# Patient Record
Sex: Female | Born: 1970 | Race: Black or African American | Hispanic: No | Smoking: Never smoker
Health system: Southern US, Community
[De-identification: ages and names within clinical notes are randomized; demographics above are authoritative.]

## PROBLEM LIST (undated history)

## (undated) DIAGNOSIS — R0602 Shortness of breath: Secondary | ICD-10-CM

## (undated) DIAGNOSIS — G4730 Sleep apnea, unspecified: Secondary | ICD-10-CM

## (undated) DIAGNOSIS — I10 Essential (primary) hypertension: Secondary | ICD-10-CM

## (undated) DIAGNOSIS — E785 Hyperlipidemia, unspecified: Secondary | ICD-10-CM

## (undated) DIAGNOSIS — I499 Cardiac arrhythmia, unspecified: Secondary | ICD-10-CM

## (undated) DIAGNOSIS — G43909 Migraine, unspecified, not intractable, without status migrainosus: Secondary | ICD-10-CM

## (undated) DIAGNOSIS — M1990 Unspecified osteoarthritis, unspecified site: Secondary | ICD-10-CM

## (undated) DIAGNOSIS — IMO0001 Reserved for inherently not codable concepts without codable children: Secondary | ICD-10-CM

## (undated) DIAGNOSIS — K219 Gastro-esophageal reflux disease without esophagitis: Secondary | ICD-10-CM

## (undated) DIAGNOSIS — R002 Palpitations: Secondary | ICD-10-CM

## (undated) HISTORY — DX: Hyperlipidemia, unspecified: E78.5

## (undated) HISTORY — DX: Palpitations: R00.2

## (undated) HISTORY — DX: Gastro-esophageal reflux disease without esophagitis: K21.9

## (undated) HISTORY — DX: Essential (primary) hypertension: I10

## (undated) HISTORY — DX: Shortness of breath: R06.02

## (undated) HISTORY — DX: Reserved for inherently not codable concepts without codable children: IMO0001

## (undated) HISTORY — DX: Migraine, unspecified, not intractable, without status migrainosus: G43.909

## (undated) HISTORY — PX: REDUCTION MAMMAPLASTY: SUR839

## (undated) HISTORY — DX: Cardiac arrhythmia, unspecified: I49.9

## (undated) HISTORY — DX: Unspecified osteoarthritis, unspecified site: M19.90

## (undated) HISTORY — DX: Sleep apnea, unspecified: G47.30

---

## 1998-07-27 HISTORY — PX: BREAST SURGERY: SHX581

## 1998-12-05 ENCOUNTER — Inpatient Hospital Stay (HOSPITAL_COMMUNITY): Admission: AD | Admit: 1998-12-05 | Discharge: 1998-12-05 | Payer: Self-pay | Admitting: *Deleted

## 1999-02-24 ENCOUNTER — Encounter: Payer: Self-pay | Admitting: Emergency Medicine

## 1999-02-24 ENCOUNTER — Emergency Department (HOSPITAL_COMMUNITY): Admission: EM | Admit: 1999-02-24 | Discharge: 1999-02-24 | Payer: Self-pay | Admitting: Emergency Medicine

## 1999-09-15 ENCOUNTER — Emergency Department (HOSPITAL_COMMUNITY): Admission: EM | Admit: 1999-09-15 | Discharge: 1999-09-15 | Payer: Self-pay

## 2000-08-31 ENCOUNTER — Emergency Department (HOSPITAL_COMMUNITY): Admission: EM | Admit: 2000-08-31 | Discharge: 2000-09-01 | Payer: Self-pay | Admitting: Emergency Medicine

## 2000-08-31 ENCOUNTER — Encounter: Payer: Self-pay | Admitting: Emergency Medicine

## 2001-04-12 ENCOUNTER — Other Ambulatory Visit: Admission: RE | Admit: 2001-04-12 | Discharge: 2001-04-12 | Payer: Self-pay | Admitting: Obstetrics and Gynecology

## 2002-03-30 ENCOUNTER — Other Ambulatory Visit: Admission: RE | Admit: 2002-03-30 | Discharge: 2002-03-30 | Payer: Self-pay | Admitting: Obstetrics and Gynecology

## 2002-06-30 ENCOUNTER — Other Ambulatory Visit: Admission: RE | Admit: 2002-06-30 | Discharge: 2002-06-30 | Payer: Self-pay | Admitting: Obstetrics and Gynecology

## 2002-07-27 DIAGNOSIS — IMO0001 Reserved for inherently not codable concepts without codable children: Secondary | ICD-10-CM

## 2002-07-27 HISTORY — DX: Reserved for inherently not codable concepts without codable children: IMO0001

## 2002-10-02 ENCOUNTER — Encounter: Payer: Self-pay | Admitting: Obstetrics and Gynecology

## 2002-10-02 ENCOUNTER — Ambulatory Visit (HOSPITAL_COMMUNITY): Admission: RE | Admit: 2002-10-02 | Discharge: 2002-10-02 | Payer: Self-pay | Admitting: Obstetrics and Gynecology

## 2002-10-24 ENCOUNTER — Inpatient Hospital Stay (HOSPITAL_COMMUNITY): Admission: AD | Admit: 2002-10-24 | Discharge: 2002-10-24 | Payer: Self-pay | Admitting: Obstetrics and Gynecology

## 2002-10-31 ENCOUNTER — Inpatient Hospital Stay (HOSPITAL_COMMUNITY): Admission: AD | Admit: 2002-10-31 | Discharge: 2002-10-31 | Payer: Self-pay | Admitting: Obstetrics and Gynecology

## 2003-01-31 ENCOUNTER — Ambulatory Visit (HOSPITAL_COMMUNITY): Admission: RE | Admit: 2003-01-31 | Discharge: 2003-01-31 | Payer: Self-pay | Admitting: Obstetrics and Gynecology

## 2003-01-31 ENCOUNTER — Encounter: Admission: RE | Admit: 2003-01-31 | Discharge: 2003-01-31 | Payer: Self-pay | Admitting: Obstetrics and Gynecology

## 2003-01-31 ENCOUNTER — Encounter: Payer: Self-pay | Admitting: Obstetrics and Gynecology

## 2003-02-19 ENCOUNTER — Ambulatory Visit (HOSPITAL_COMMUNITY): Admission: RE | Admit: 2003-02-19 | Discharge: 2003-02-19 | Payer: Self-pay | Admitting: Obstetrics and Gynecology

## 2003-02-19 ENCOUNTER — Encounter: Payer: Self-pay | Admitting: Obstetrics and Gynecology

## 2003-02-21 ENCOUNTER — Inpatient Hospital Stay (HOSPITAL_COMMUNITY): Admission: AD | Admit: 2003-02-21 | Discharge: 2003-02-21 | Payer: Self-pay | Admitting: Obstetrics and Gynecology

## 2003-02-24 ENCOUNTER — Inpatient Hospital Stay (HOSPITAL_COMMUNITY): Admission: AD | Admit: 2003-02-24 | Discharge: 2003-02-26 | Payer: Self-pay | Admitting: Obstetrics and Gynecology

## 2003-04-04 ENCOUNTER — Other Ambulatory Visit: Admission: RE | Admit: 2003-04-04 | Discharge: 2003-04-04 | Payer: Self-pay | Admitting: Obstetrics and Gynecology

## 2003-06-26 ENCOUNTER — Encounter: Admission: RE | Admit: 2003-06-26 | Discharge: 2003-09-24 | Payer: Self-pay | Admitting: Cardiology

## 2003-06-28 ENCOUNTER — Encounter: Admission: RE | Admit: 2003-06-28 | Discharge: 2003-06-28 | Payer: Self-pay | Admitting: Family Medicine

## 2003-06-28 ENCOUNTER — Ambulatory Visit (HOSPITAL_COMMUNITY): Admission: RE | Admit: 2003-06-28 | Discharge: 2003-06-28 | Payer: Self-pay | Admitting: Family Medicine

## 2003-08-07 ENCOUNTER — Encounter: Admission: RE | Admit: 2003-08-07 | Discharge: 2003-08-07 | Payer: Self-pay | Admitting: Sports Medicine

## 2003-10-03 ENCOUNTER — Encounter: Admission: RE | Admit: 2003-10-03 | Discharge: 2003-10-03 | Payer: Self-pay | Admitting: Family Medicine

## 2003-10-09 ENCOUNTER — Encounter: Admission: RE | Admit: 2003-10-09 | Discharge: 2003-10-09 | Payer: Self-pay | Admitting: Family Medicine

## 2003-10-24 ENCOUNTER — Encounter: Admission: RE | Admit: 2003-10-24 | Discharge: 2003-10-24 | Payer: Self-pay | Admitting: Family Medicine

## 2003-11-22 ENCOUNTER — Encounter: Admission: RE | Admit: 2003-11-22 | Discharge: 2003-11-22 | Payer: Self-pay | Admitting: Sports Medicine

## 2003-12-05 ENCOUNTER — Encounter: Admission: RE | Admit: 2003-12-05 | Discharge: 2003-12-05 | Payer: Self-pay | Admitting: Family Medicine

## 2004-03-11 ENCOUNTER — Encounter: Admission: RE | Admit: 2004-03-11 | Discharge: 2004-03-11 | Payer: Self-pay | Admitting: Obstetrics and Gynecology

## 2004-06-16 ENCOUNTER — Ambulatory Visit: Payer: Self-pay | Admitting: Sports Medicine

## 2004-06-30 ENCOUNTER — Ambulatory Visit: Payer: Self-pay | Admitting: Sports Medicine

## 2004-07-10 ENCOUNTER — Ambulatory Visit: Payer: Self-pay | Admitting: Family Medicine

## 2004-09-19 ENCOUNTER — Ambulatory Visit: Payer: Self-pay | Admitting: Family Medicine

## 2004-10-07 ENCOUNTER — Ambulatory Visit: Payer: Self-pay | Admitting: Family Medicine

## 2004-11-24 ENCOUNTER — Ambulatory Visit: Payer: Self-pay | Admitting: Family Medicine

## 2004-12-01 ENCOUNTER — Ambulatory Visit: Payer: Self-pay | Admitting: Sports Medicine

## 2004-12-31 ENCOUNTER — Ambulatory Visit: Payer: Self-pay | Admitting: Family Medicine

## 2005-01-05 ENCOUNTER — Encounter: Admission: RE | Admit: 2005-01-05 | Discharge: 2005-01-05 | Payer: Self-pay | Admitting: Sports Medicine

## 2005-01-05 IMAGING — US US TRANSVAGINAL NON-OB
1 series · 13 of 25 positions shown · non-contrast
Comparison: None.

CLINICAL DATA: Unable to locate IUD.  
TRANSABDOMINAL AND TRANSVAGINAL PELVIC ULTRASOUND:
TECHNIQUE: Both transabdominal and transvaginal ultrasound examinations of the pelvis were performed including evaluation of the uterus, ovaries, adnexal regions, and pelvic cul-de-sac.

[Series 1: unknown · 0.25mm/px · 13 of 53 slices shown]
[im 1/53]
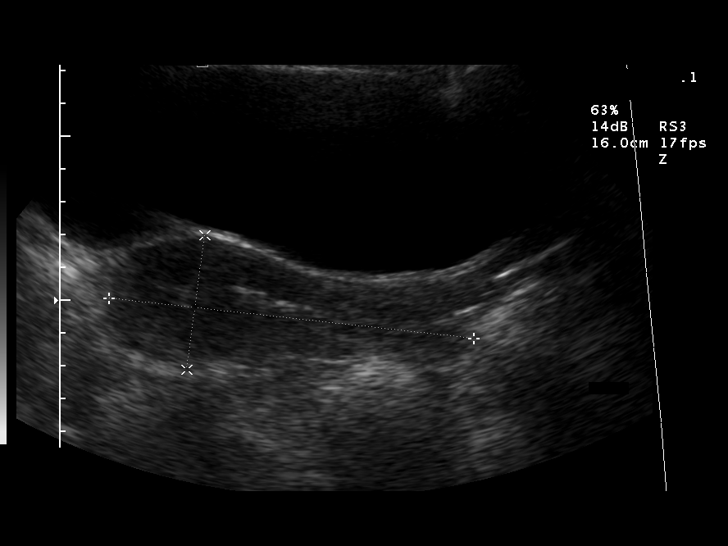
[im 5/53]
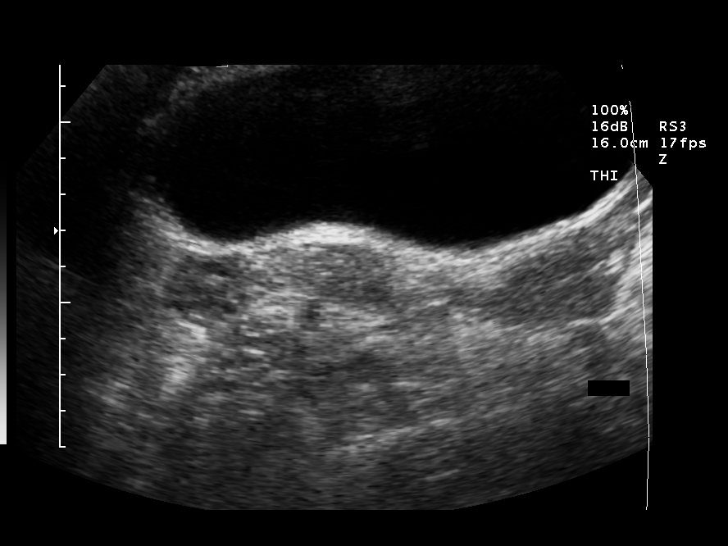
[im 9/53]
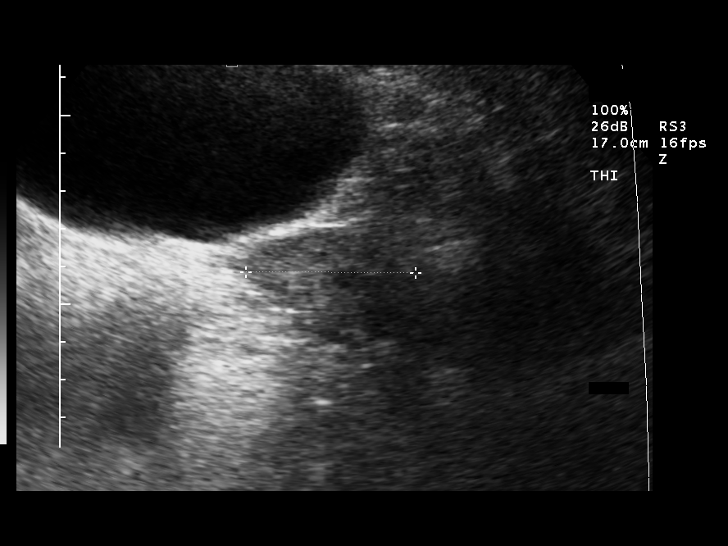
[im 14/53]
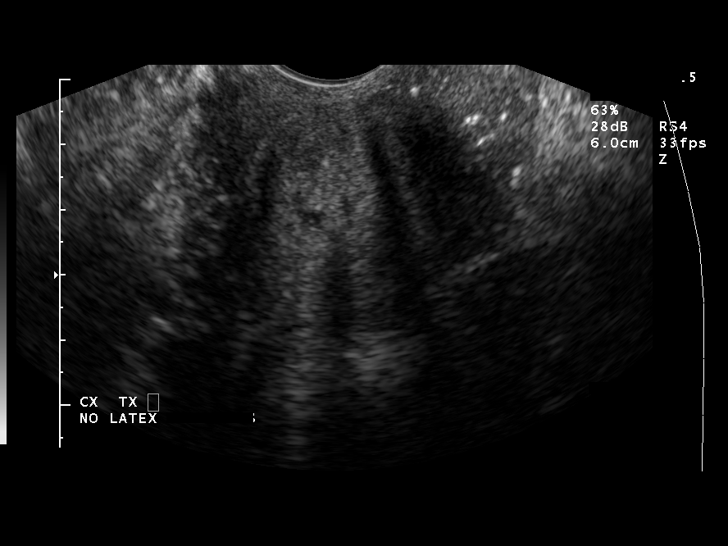
[im 18/53]
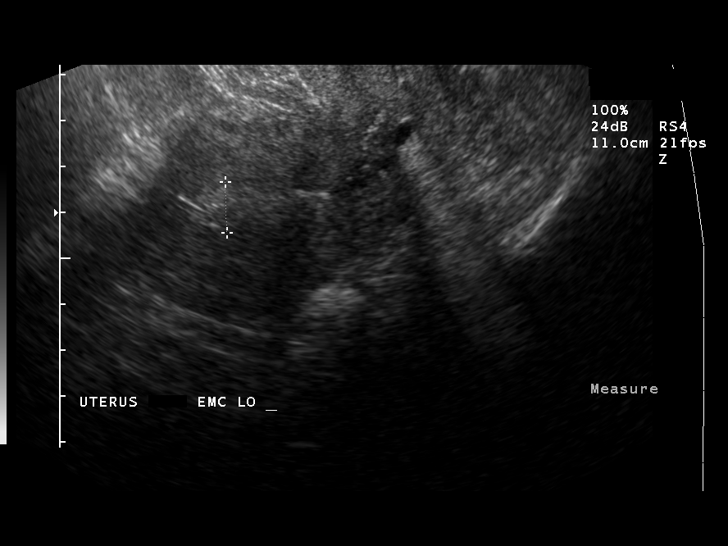
[im 22/53]
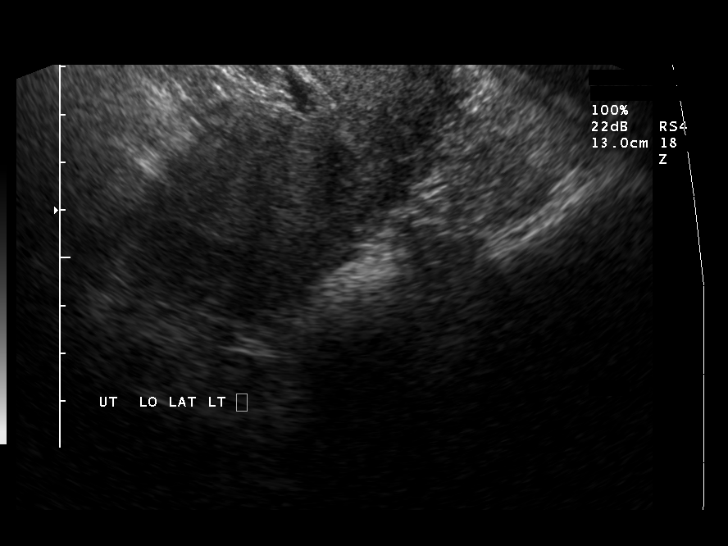
[im 27/53]
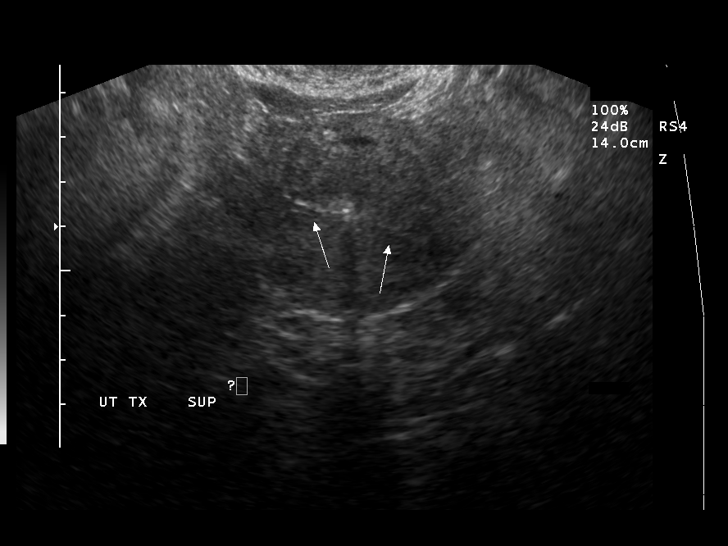
[im 31/53]
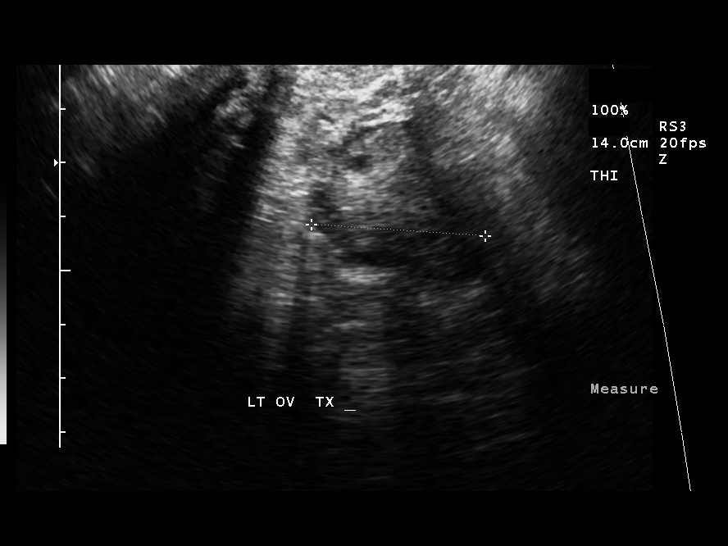
[im 35/53]
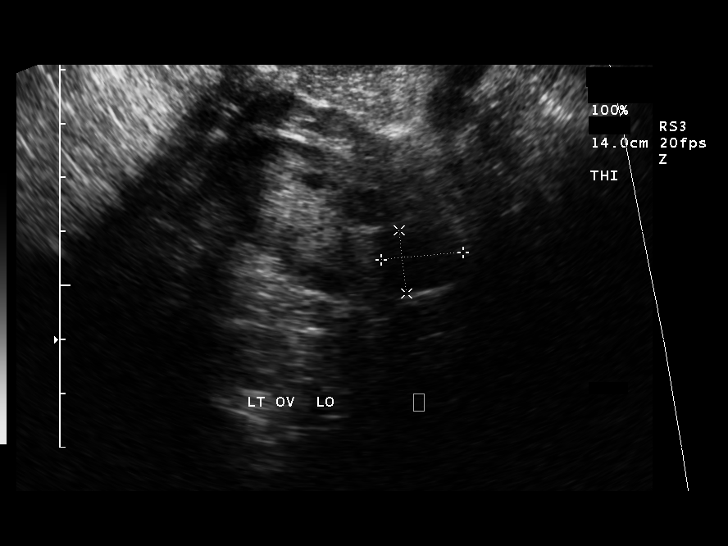
[im 40/53]
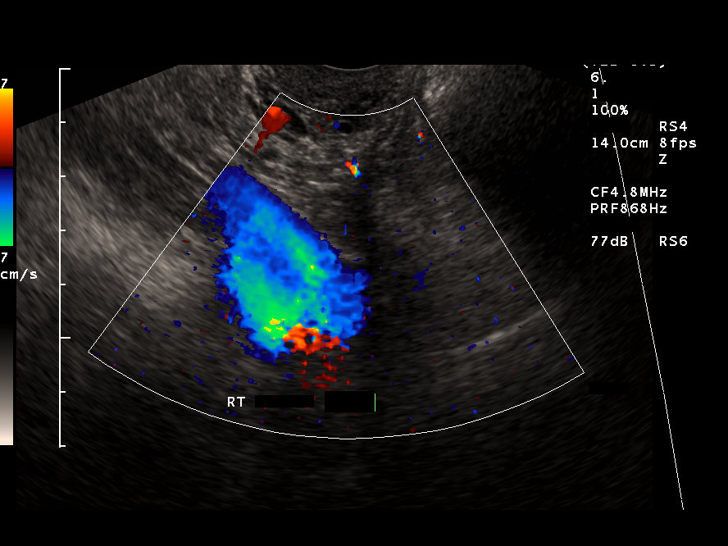
[im 44/53]
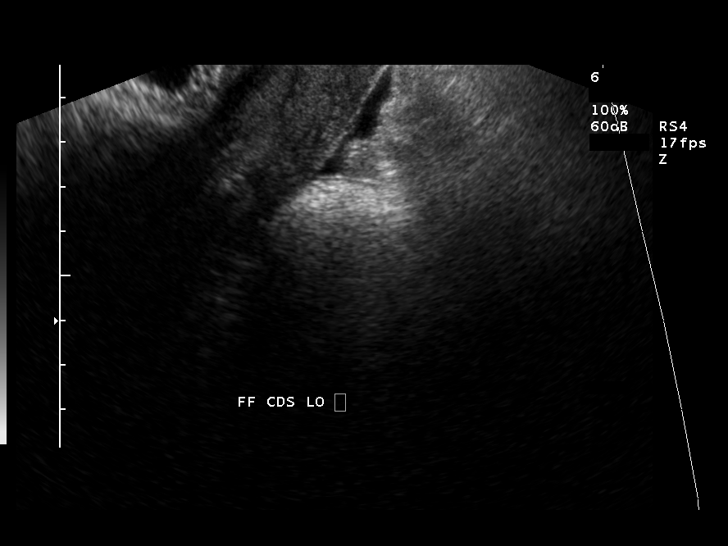
[im 48/53]
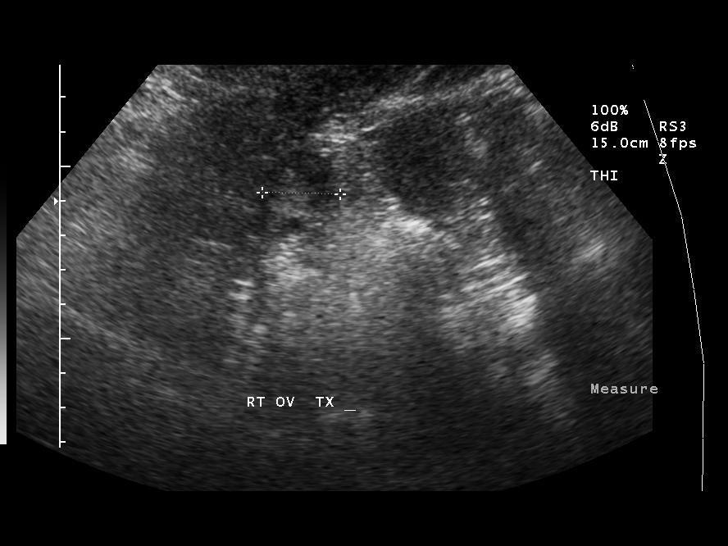
[im 53/53]
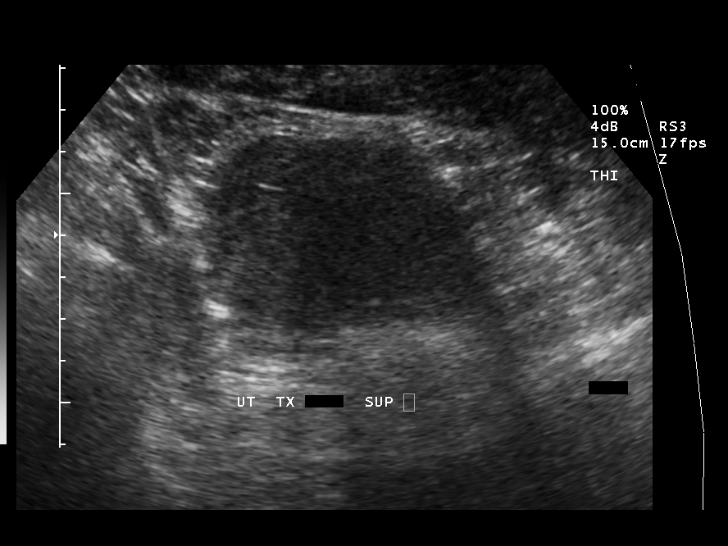

[13 of 25 positions shown; findings below may reference images not displayed]

Transabdominal endovaginal scanning was performed.  The study was technically challenging secondary to the patient?s large body habitus. 
The uterus measures 11.2 x 4.1 x 5.9 cm.  The endometrial stripe is 11 mm in thickness. 
An echogenic linear structure is identified within the endometrium which demonstrates reverberation artifact.  This is probably the IUD but is not as well seen as usual.  The patient was unsure whether this is MANIA device or some other IUD.  
Limited evaluation of the ovaries was possible.   The right ovary could only be seen transabdominally and measured 3.0 x 2.5 x 2.3 cm.  The left ovary was visible in only a limited fashion on endovaginal scanning and measured 3.4 x 2.5 x 3.2 cm.  A dominant 15 mm follicle is noted in the left ovary.   
A very small amount of fluid is noted in the cul-de-sac.
IMPRESSION: Technically challenging study secondary to the patient?s large body habitus.  There does appear to be an echogenic linear focus in the endometrial cavity suggesting the presence of an IUD but it is not well demonstrated.  The patient is unsure of the IUD type, but typically, a devices such as MANIA IUD are much better demonstrated on ultrasound.  If this is a radiopaque device, correlation with an pelvic radiograph may prove helpful.

## 2005-02-24 ENCOUNTER — Ambulatory Visit: Payer: Self-pay | Admitting: Family Medicine

## 2005-05-28 ENCOUNTER — Ambulatory Visit: Payer: Self-pay | Admitting: Family Medicine

## 2005-05-28 ENCOUNTER — Observation Stay (HOSPITAL_COMMUNITY): Admission: EM | Admit: 2005-05-28 | Discharge: 2005-05-29 | Payer: Self-pay | Admitting: Emergency Medicine

## 2005-06-01 ENCOUNTER — Ambulatory Visit: Payer: Self-pay | Admitting: Family Medicine

## 2005-06-03 ENCOUNTER — Ambulatory Visit: Payer: Self-pay | Admitting: Family Medicine

## 2005-06-05 ENCOUNTER — Ambulatory Visit: Payer: Self-pay | Admitting: Family Medicine

## 2005-06-30 ENCOUNTER — Ambulatory Visit (HOSPITAL_COMMUNITY): Admission: RE | Admit: 2005-06-30 | Discharge: 2005-06-30 | Payer: Self-pay | Admitting: Sports Medicine

## 2005-06-30 ENCOUNTER — Ambulatory Visit: Payer: Self-pay | Admitting: Sports Medicine

## 2005-07-13 ENCOUNTER — Ambulatory Visit: Payer: Self-pay | Admitting: Family Medicine

## 2005-08-25 ENCOUNTER — Ambulatory Visit: Payer: Self-pay | Admitting: Sports Medicine

## 2005-09-08 ENCOUNTER — Ambulatory Visit: Payer: Self-pay | Admitting: Sports Medicine

## 2005-09-10 ENCOUNTER — Emergency Department (HOSPITAL_COMMUNITY): Admission: EM | Admit: 2005-09-10 | Discharge: 2005-09-10 | Payer: Self-pay | Admitting: Family Medicine

## 2005-09-14 ENCOUNTER — Emergency Department (HOSPITAL_COMMUNITY): Admission: EM | Admit: 2005-09-14 | Discharge: 2005-09-14 | Payer: Self-pay | Admitting: Emergency Medicine

## 2005-09-14 IMAGING — CT CT HEAD W/O CM
1 series · 16 of 30 positions shown, 20 images · IV contrast (agent unspecified)
Comparison: none

CLINICAL DATA: Migraine.  Headache for one week.  Worsening photophobia.  Hypertension.  
 HEAD CT WITHOUT CONTRAST:
TECHNIQUE: Contiguous axial images were obtained from the base of the skull through the vertex according to standard protocol without contrast.

[Series 2: headseq 4.8 h45s · axial · 0.40mm/px · z∈[-151,-11]mm · 16 of 33 slices shown, 20 images]
[im 2/33  brain]
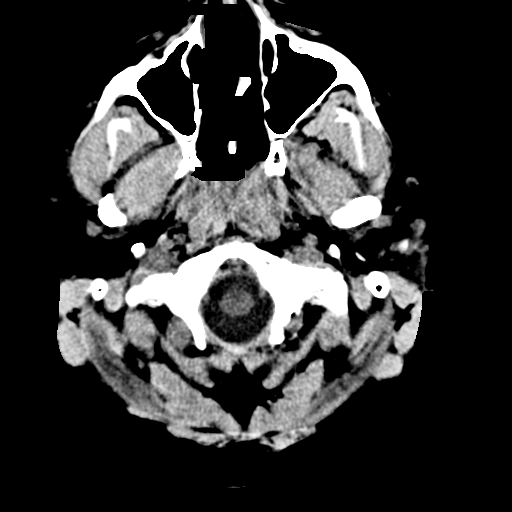
[im 2/33  bone]
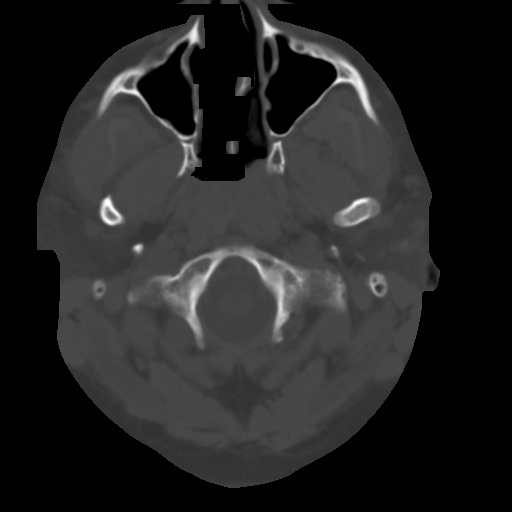
[im 4/33  brain]
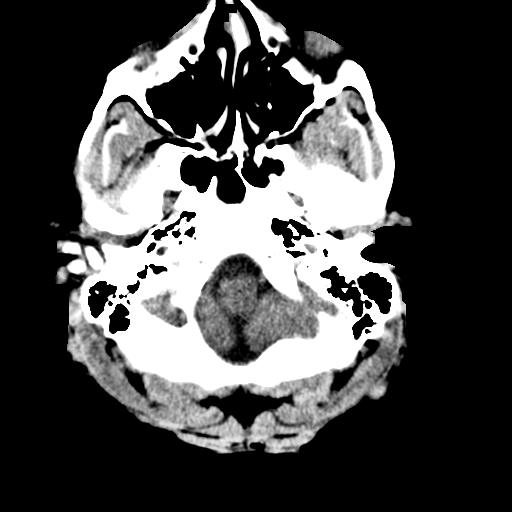
[im 6/33  brain]
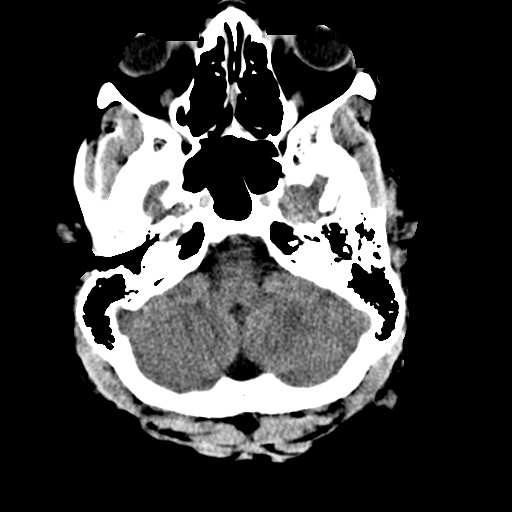
[im 8/33  brain]
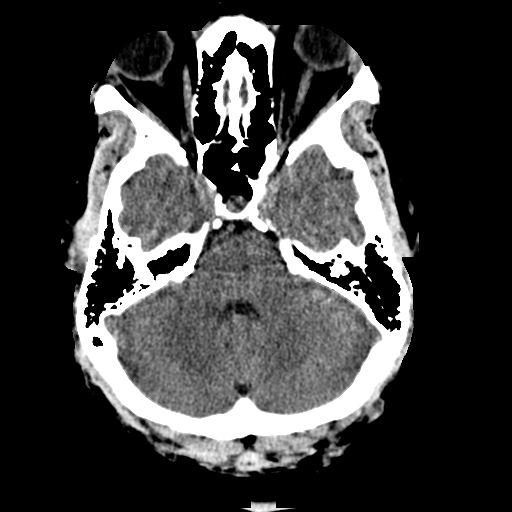
[im 9/33  brain]
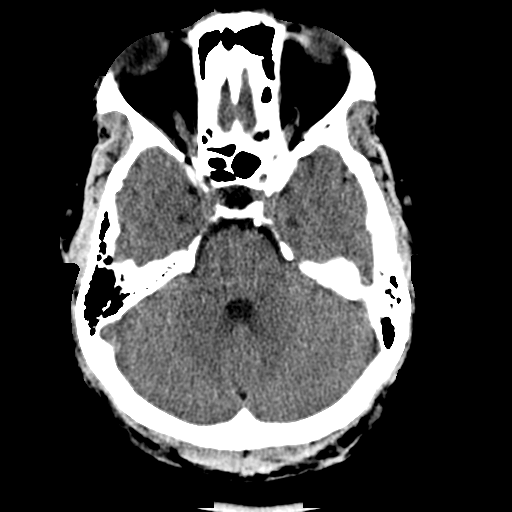
[im 9/33  bone]
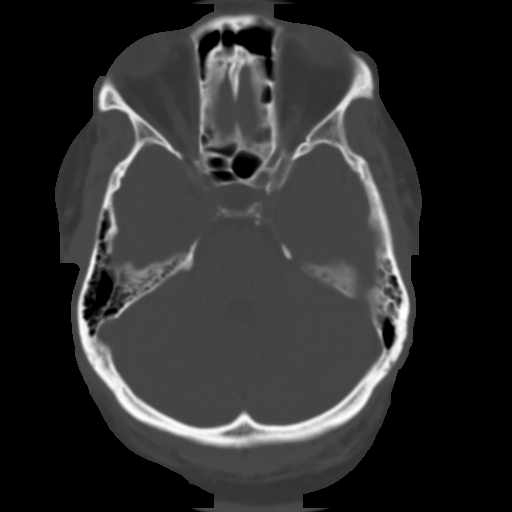
[im 12/33  brain]
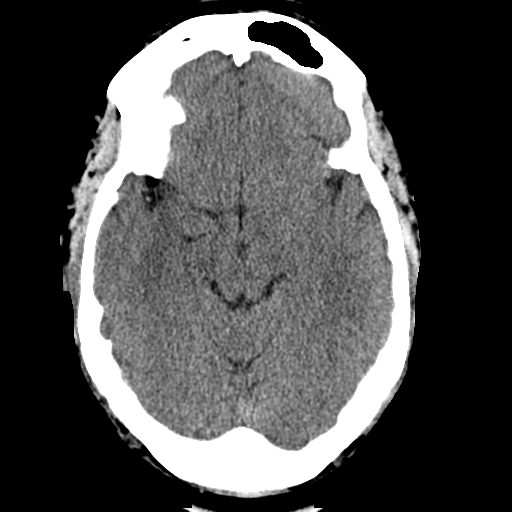
[im 14/33  brain]
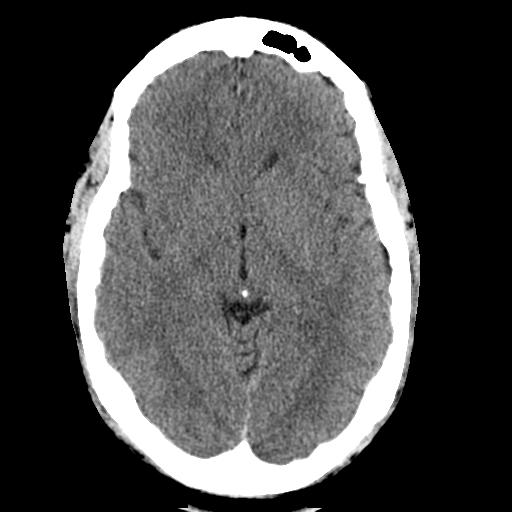
[im 16/33  brain]
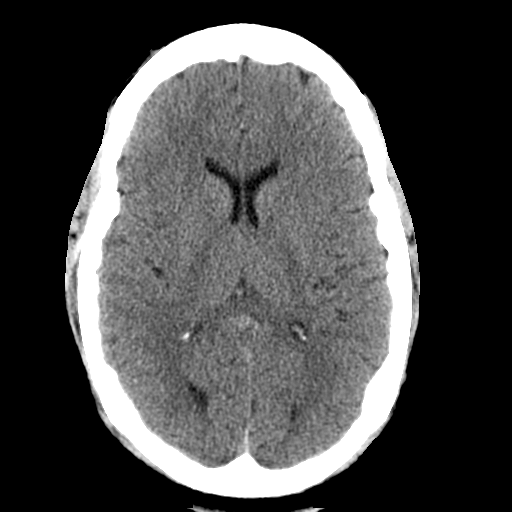
[im 17/33  brain]
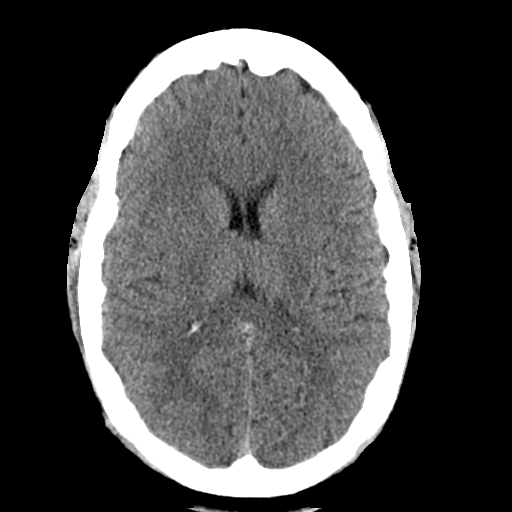
[im 17/33  bone]
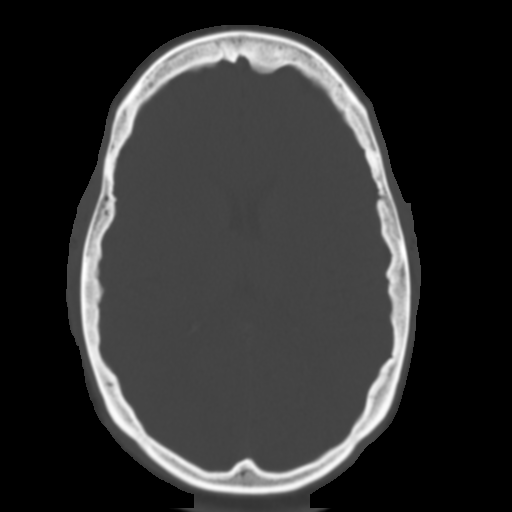
[im 19/33  brain]
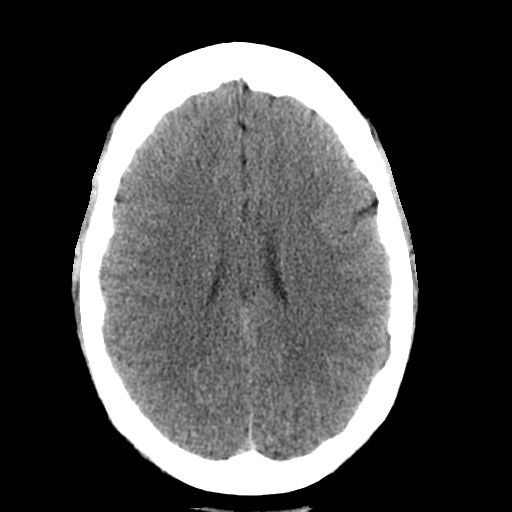
[im 21/33  brain]
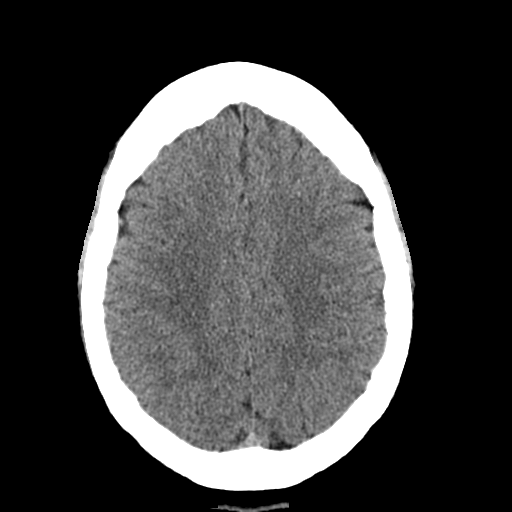
[im 24/33  brain]
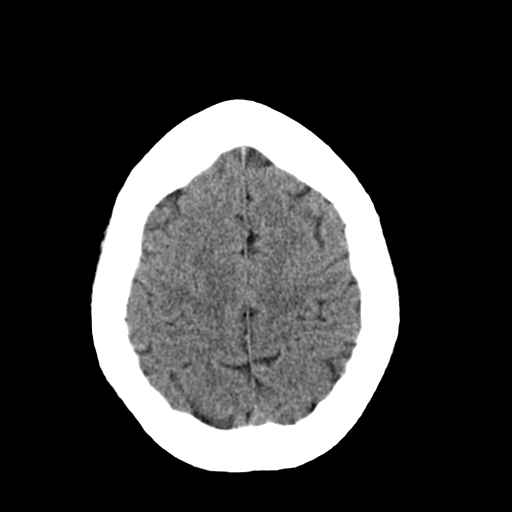
[im 25/33  brain]
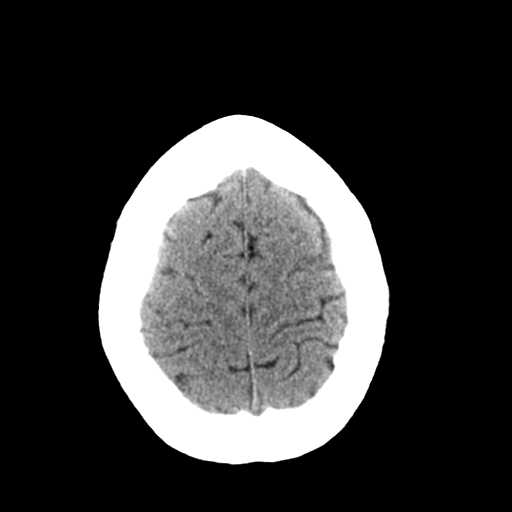
[im 25/33  bone]
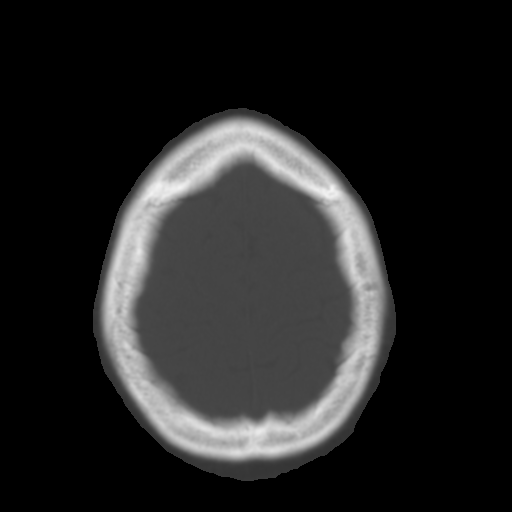
[im 27/33  brain]
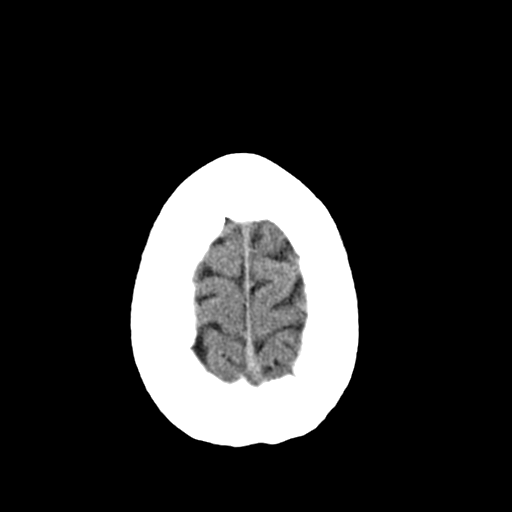
[im 29/33  brain]
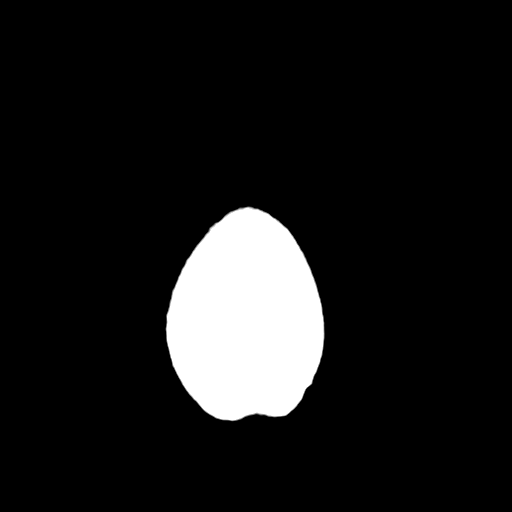
[im 31/33  brain]
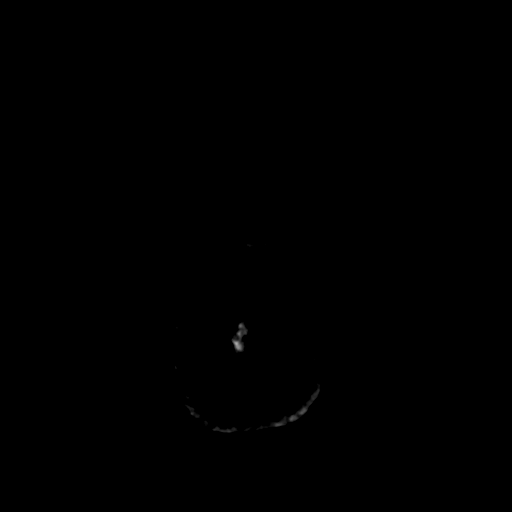

[16 of 30 positions shown; findings below may reference images not displayed]

FINDINGS: There is no evidence of intracranial hemorrhage, brain edema, or mass effect.  No other intra-axial abnormalities are seen, and the ventricles are within normal limits.  No abnormal extra-axial fluid collections or masses are identified.  No skull abnormalities are noted.
 The lateral and third ventricles have somewhat of an attenuated appearance; however, I feel that this caliber may well be within normal limits for age.
IMPRESSION: No acute intracranial abnormality.

## 2005-09-21 ENCOUNTER — Ambulatory Visit: Payer: Self-pay | Admitting: Family Medicine

## 2005-12-10 ENCOUNTER — Ambulatory Visit: Payer: Self-pay | Admitting: Family Medicine

## 2006-01-11 ENCOUNTER — Ambulatory Visit: Payer: Self-pay | Admitting: Family Medicine

## 2006-02-24 ENCOUNTER — Encounter (INDEPENDENT_AMBULATORY_CARE_PROVIDER_SITE_OTHER): Payer: Self-pay | Admitting: *Deleted

## 2006-03-24 ENCOUNTER — Ambulatory Visit: Payer: Self-pay | Admitting: Family Medicine

## 2006-03-24 ENCOUNTER — Other Ambulatory Visit: Admission: RE | Admit: 2006-03-24 | Discharge: 2006-03-24 | Payer: Self-pay | Admitting: Family Medicine

## 2006-03-25 ENCOUNTER — Encounter: Admission: RE | Admit: 2006-03-25 | Discharge: 2006-03-25 | Payer: Self-pay | Admitting: Sports Medicine

## 2006-03-25 IMAGING — US US TRANSVAGINAL NON-OB
1 series · 14 of 25 positions shown · non-contrast
Comparison: none

CLINICAL DATA: 35-year-old, check placement of IUD.
TRANSABDOMINAL AND TRANSVAGINAL PELVIC ULTRASOUND:
TECHNIQUE: Both transabdominal and transvaginal ultrasound examinations of the pelvis were performed including evaluation of the uterus, ovaries, adnexal regions, and pelvic cul-de-sac.

[Series 1: us transvaginal non-ob · 0.32mm/px · 14 of 60 slices shown]
[im 1/60]
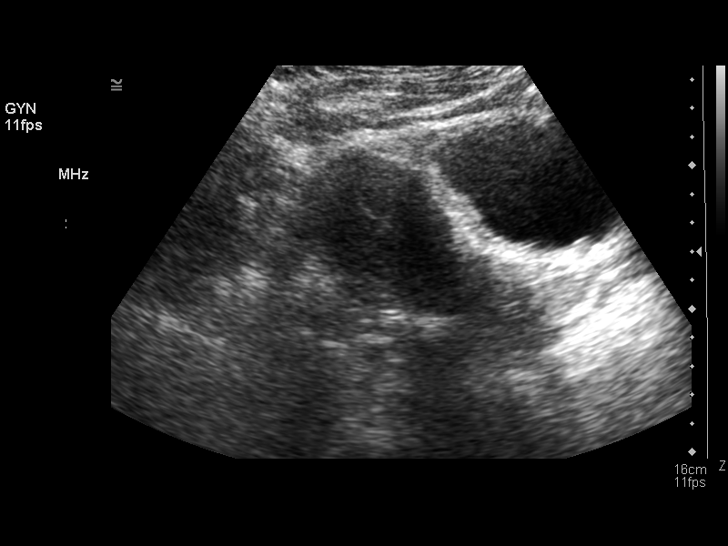
[im 5/60]
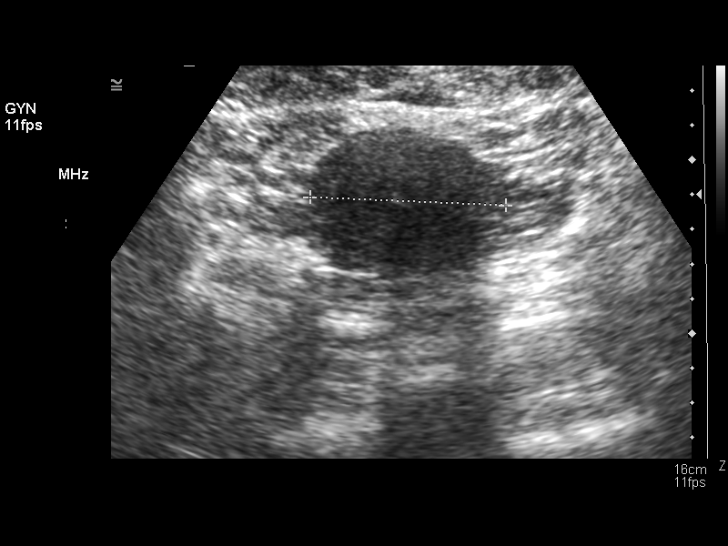
[im 10/60]
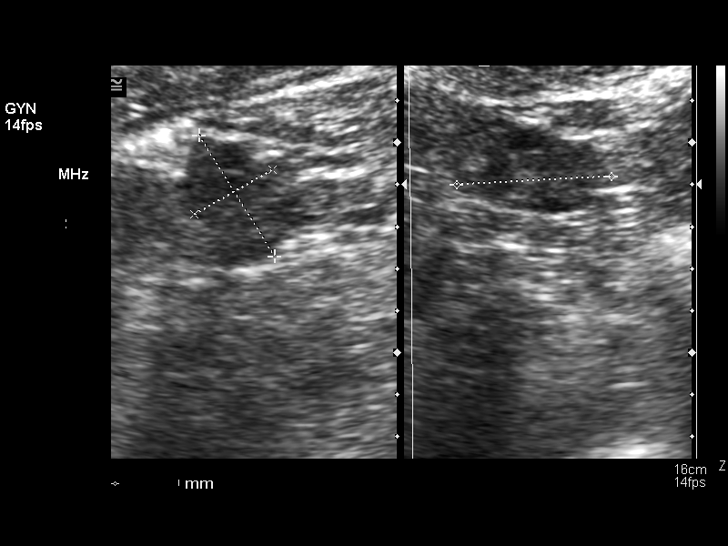
[im 15/60]
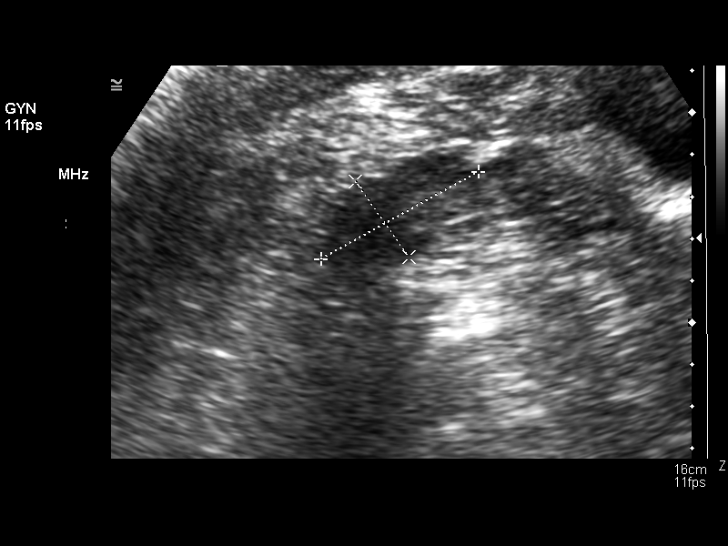
[im 20/60]
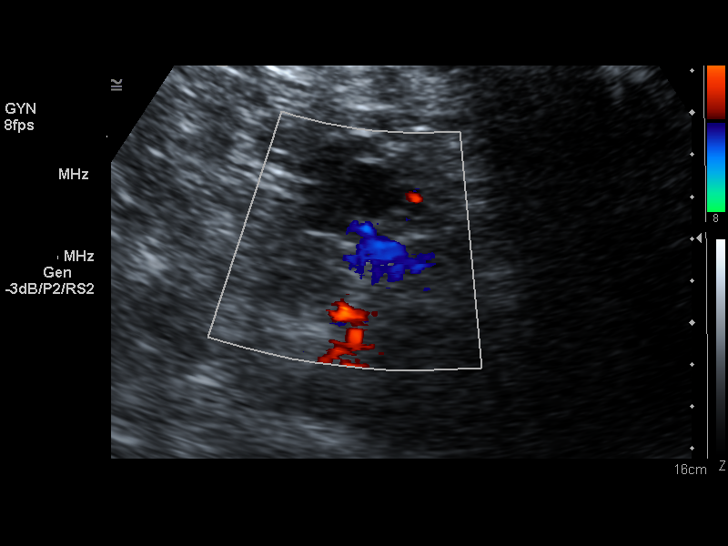
[im 23/60]
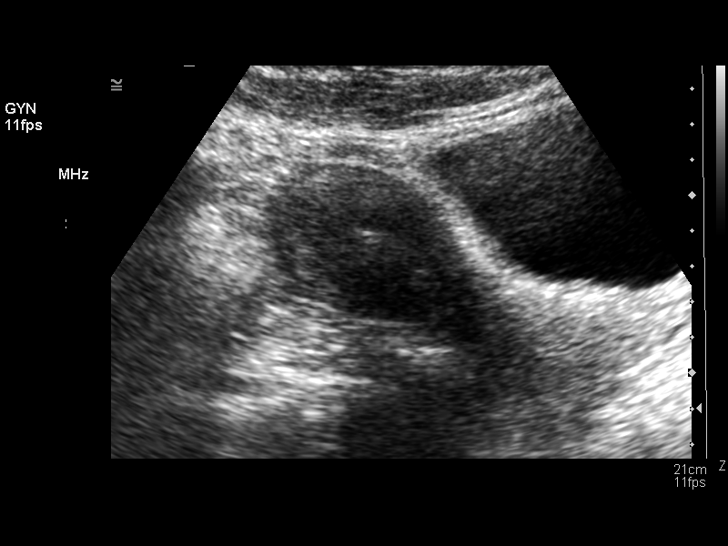
[im 28/60]
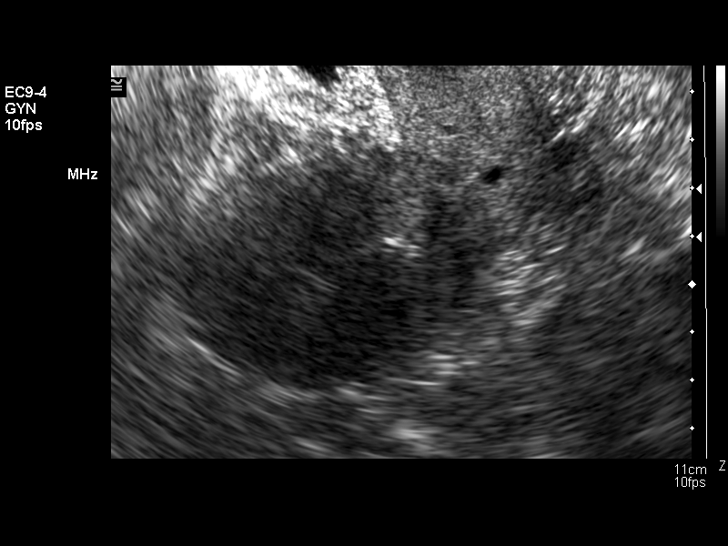
[im 32/60]
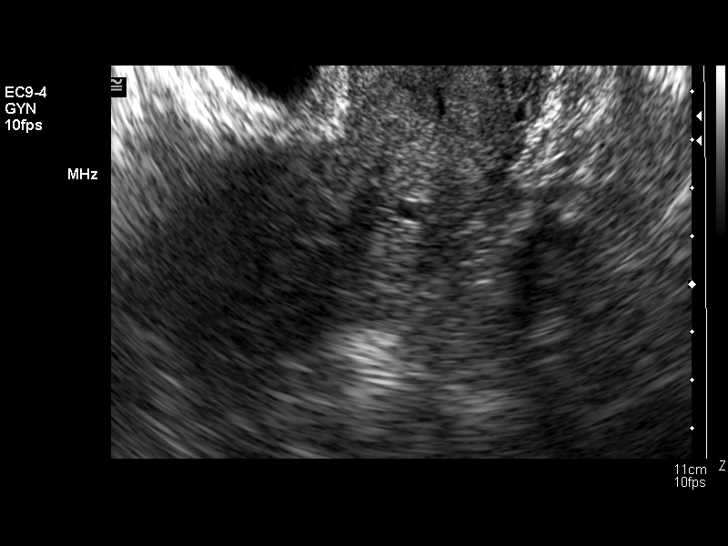
[im 37/60]
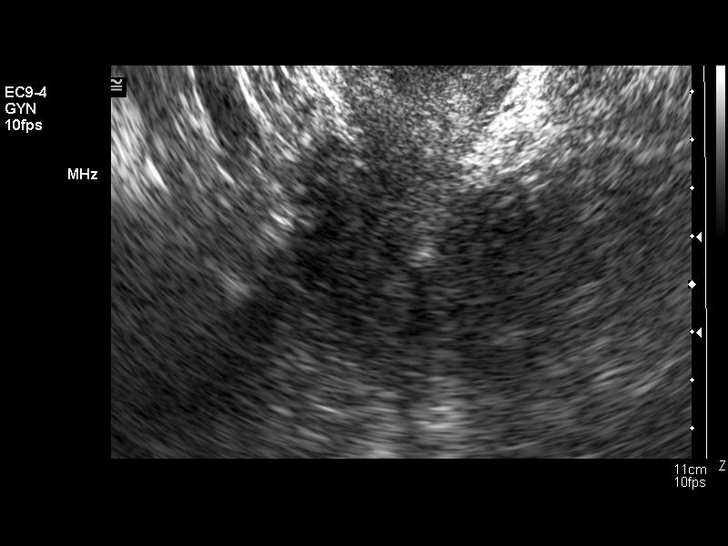
[im 40/60]
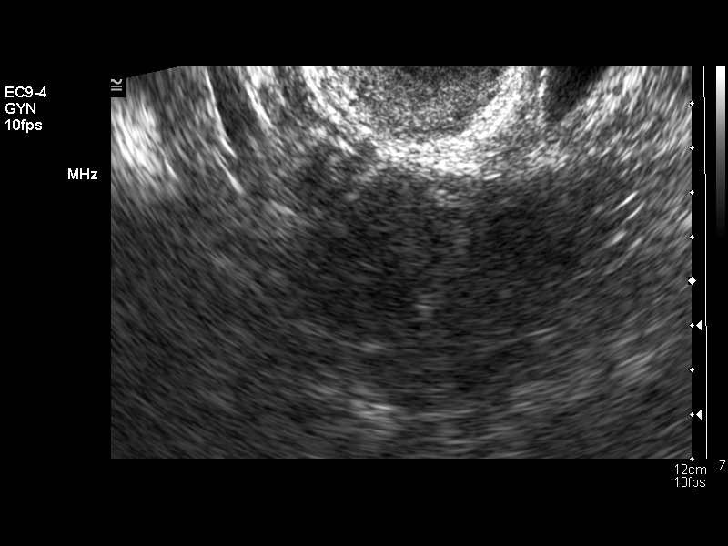
[im 45/60]
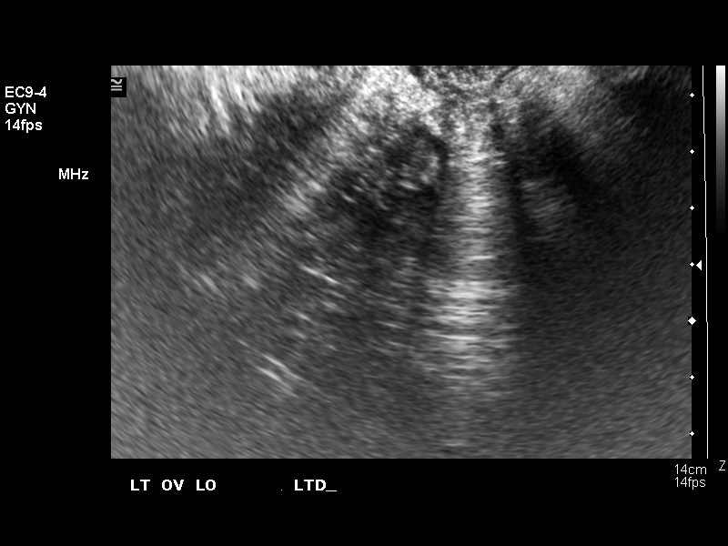
[im 50/60]
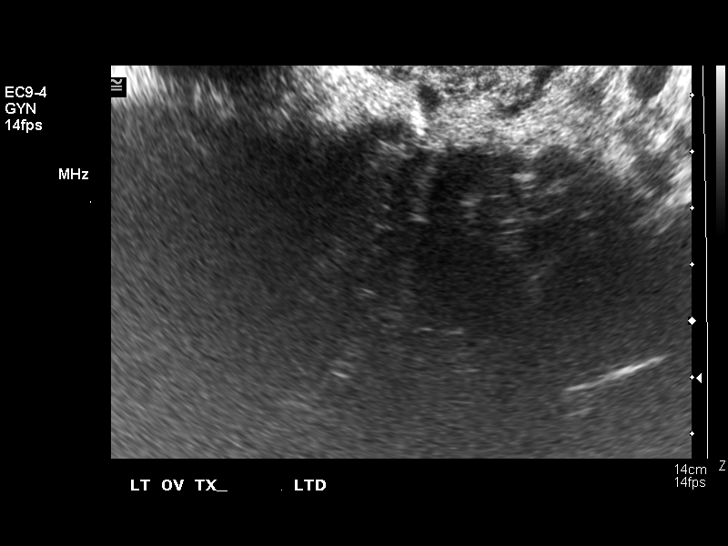
[im 55/60]
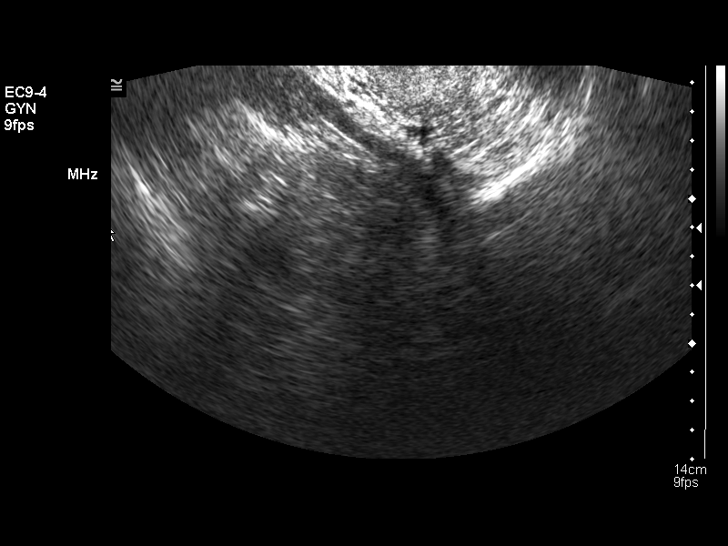
[im 60/60]
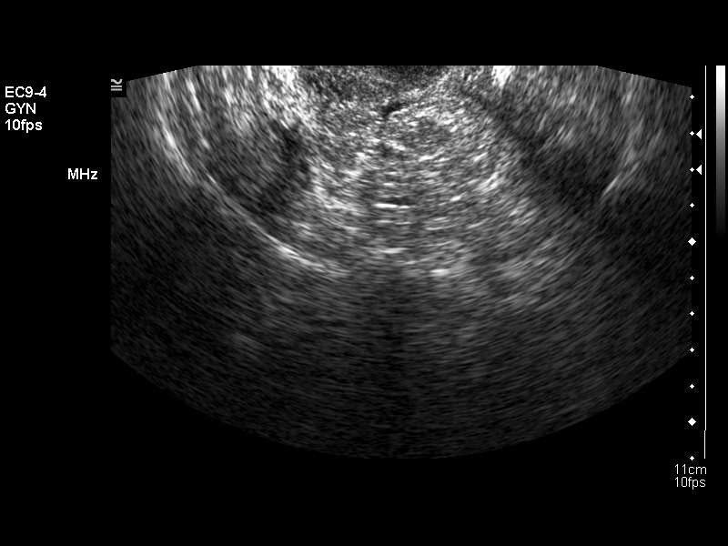

[14 of 25 positions shown; findings below may reference images not displayed]

FINDINGS: Uterus measures 10.5 x 4.7 x 5.6 cm.  There is an echogenic structure in the endometrial canal which is likely the patient?s IUD.  It is not well seen and the endometrium is not well visualized either. 
The right ovary measures 3.4 x 2.1 x 3.7 cm.  The left ovary measures 4.3 x 2.2 x 3.3 cm.  There is a 1.9 x 1.3 x 2.3 cm cyst with some internal echoes associated with the left ovary.
IMPRESSION: 1.  Limited exam due to the patient?s body habitus.  There does appear to be an echogenic structure in the endometrial canal which is likely the patient?s IUD.  An AP plain film of the pelvis may be helpful to document position.  Poor visualization of the endometrium.  
2.  Unremarkable ovaries.  There is a 1.9 x 1.3 x 2.3 cm cyst associated with the left ovary which is slight complex and likely hemorrhagic.

## 2006-04-30 ENCOUNTER — Ambulatory Visit: Payer: Self-pay | Admitting: Family Medicine

## 2006-05-14 ENCOUNTER — Encounter: Admission: RE | Admit: 2006-05-14 | Discharge: 2006-05-14 | Payer: Self-pay | Admitting: Sports Medicine

## 2006-05-14 ENCOUNTER — Ambulatory Visit: Payer: Self-pay | Admitting: Family Medicine

## 2006-05-14 IMAGING — CR DG PELVIS 1-2V
2 series · 2 of 2 positions shown · non-contrast
Comparison: none

CLINICAL DATA: Neck pain, right arm numbness

Cervical spine 5 view:
No previous for comparison. Straightening of the normal lordosis of the cervical
spine. Tiny anterior endplate spurs C4-C6. No prevertebral soft tissue swelling.
Intervertebral disc heights well maintained throughout. Negative for fracture.

[t pelvis a.p. (1 of 2)]
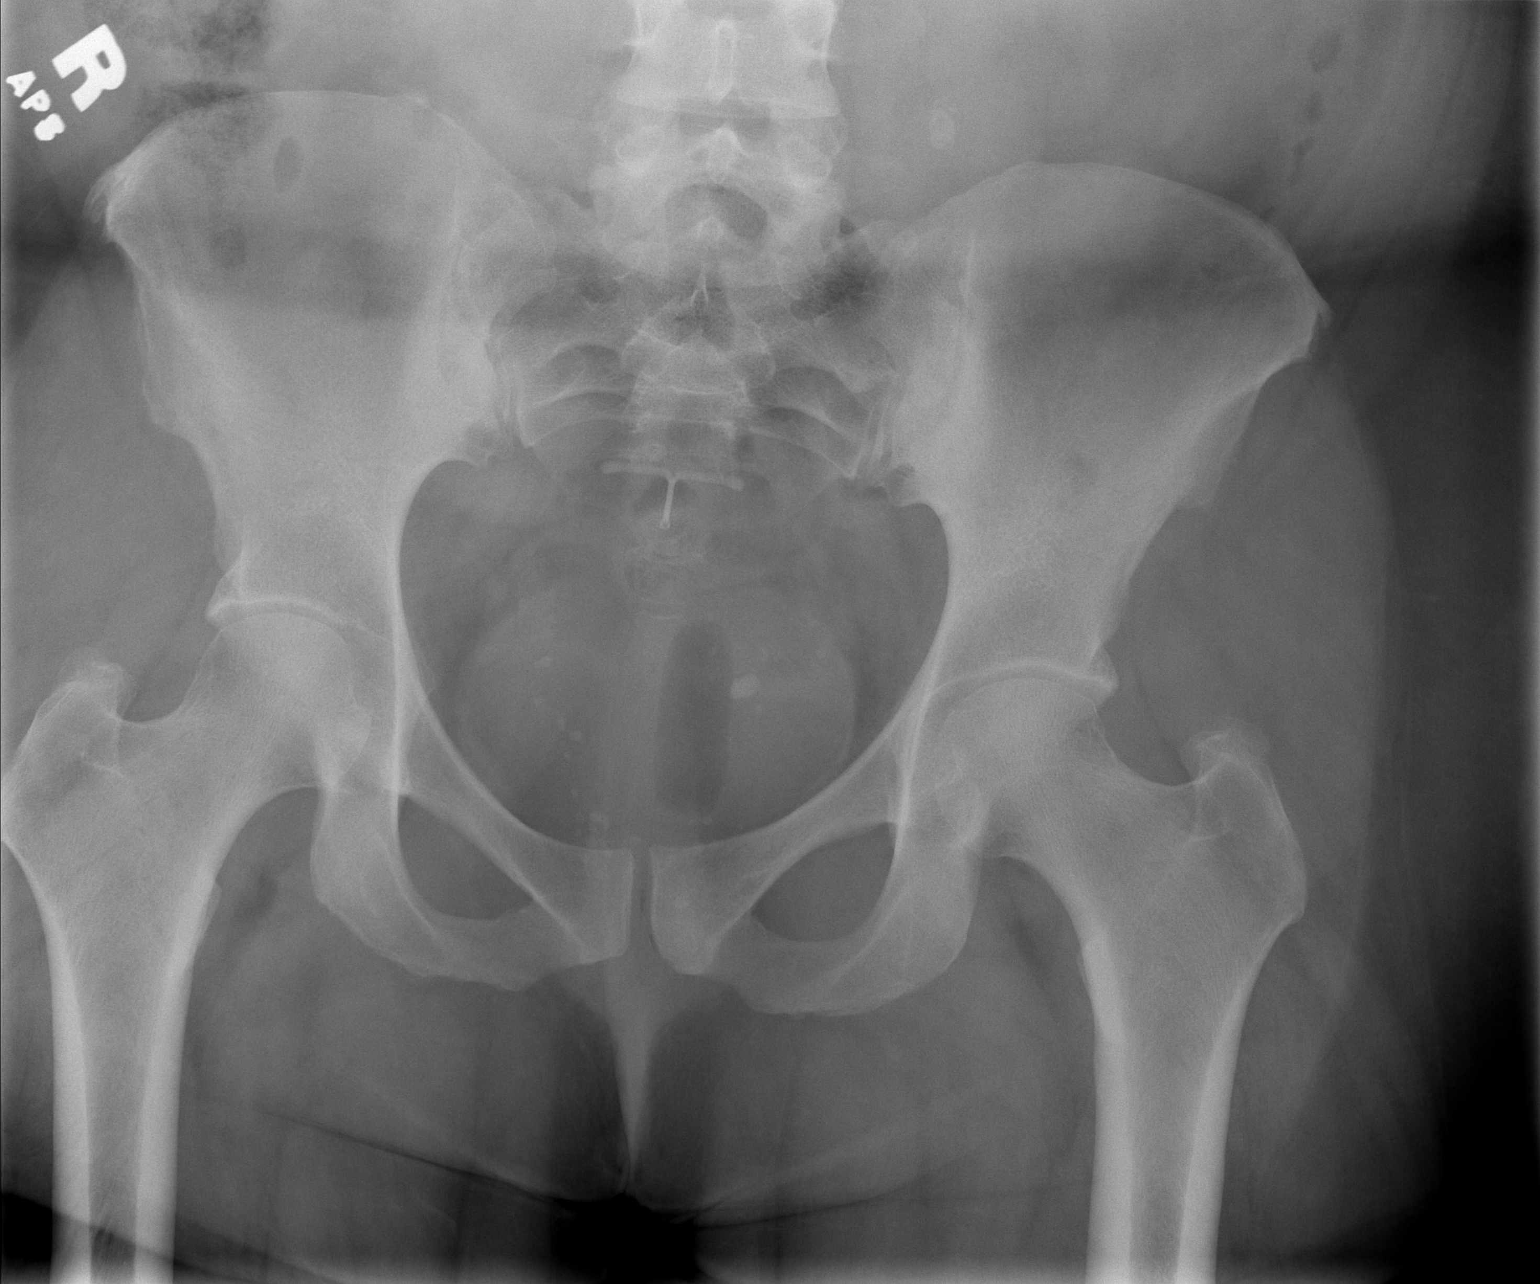

[t pelvis a.p. (2 of 2)]
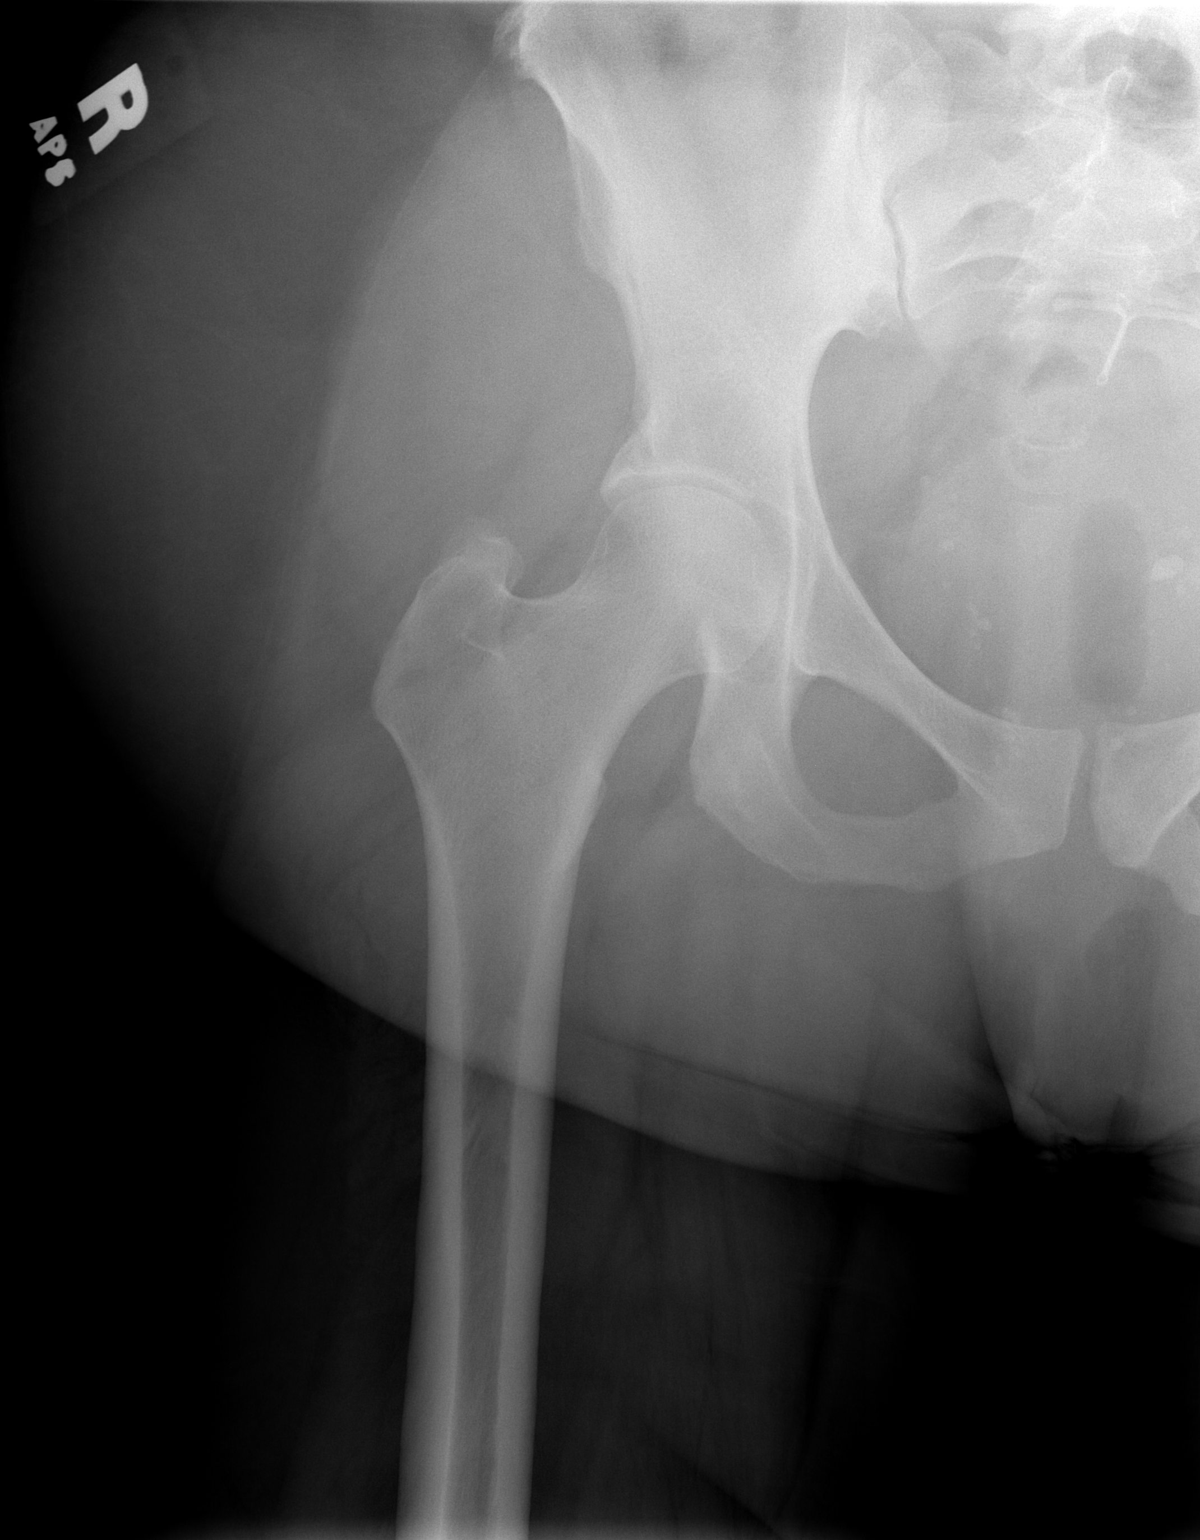

[2 of 2 positions shown; findings below may reference images not displayed]

IMPRESSION: 1. Negative for fracture or other acute bone injury.
2. Early anterior endplate spurring C4-C6.
3. Straightening of the normal lordosis, which may be due to positioning, spasm,
or soft tissue injury.

Pelvis one view:

No previous for comparison. Negative for fracture, dislocation, or other acute
bone injury. Phleboliths in the lower pelvis. IUD noted.
IMPRESSION: 1. Negative for acute bone abnormality

## 2006-05-14 IMAGING — CR DG CERVICAL SPINE COMPLETE 4+V
5 series · 5 of 5 positions shown · non-contrast
Comparison: none

CLINICAL DATA: Neck pain, right arm numbness

Cervical spine 5 view:
No previous for comparison. Straightening of the normal lordosis of the cervical
spine. Tiny anterior endplate spurs C4-C6. No prevertebral soft tissue swelling.
Intervertebral disc heights well maintained throughout. Negative for fracture.

[w c-spine lat]
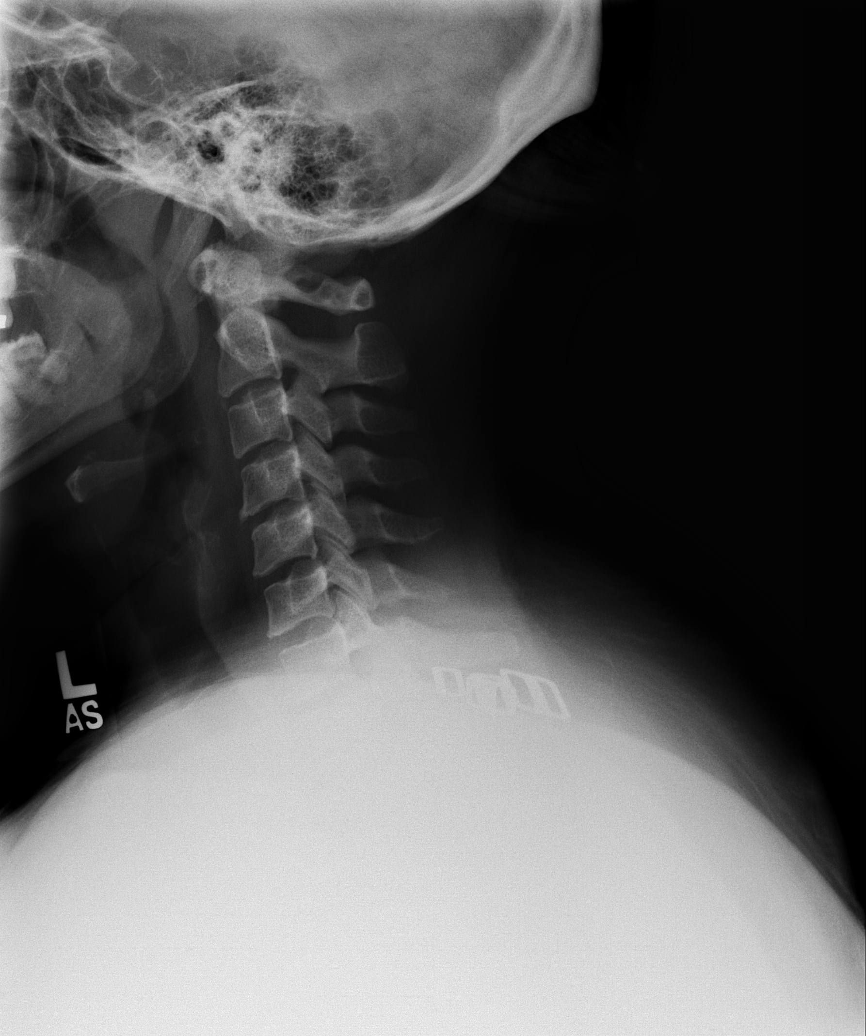

[w c-spine oblique (1 of 2)]
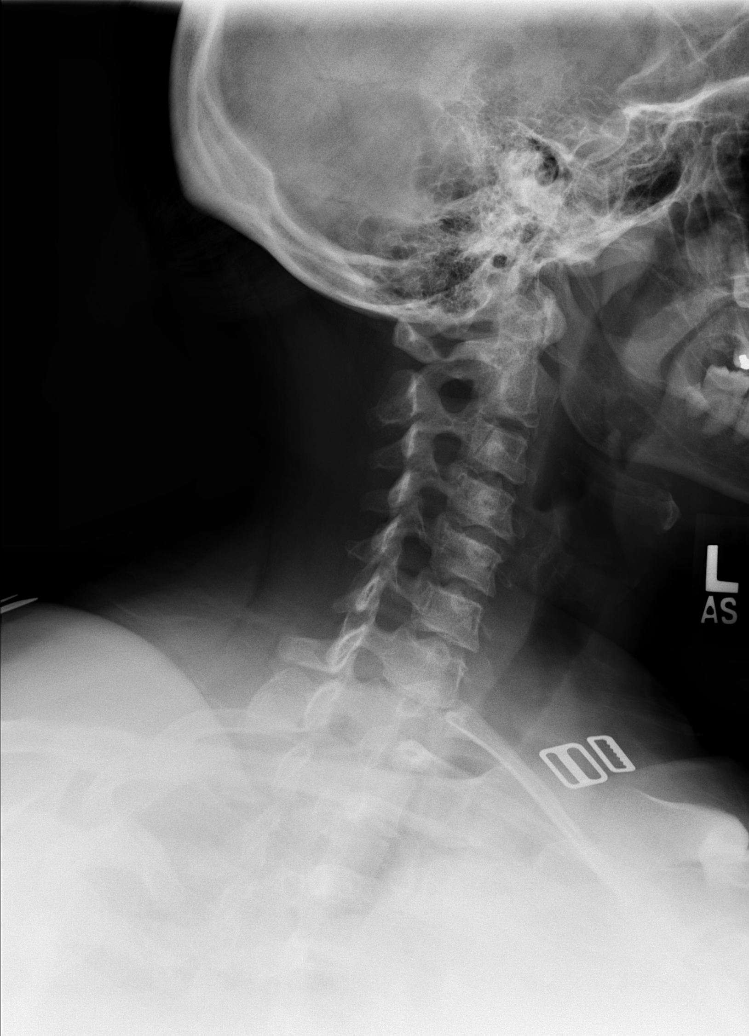

[w c-spine oblique (2 of 2)]
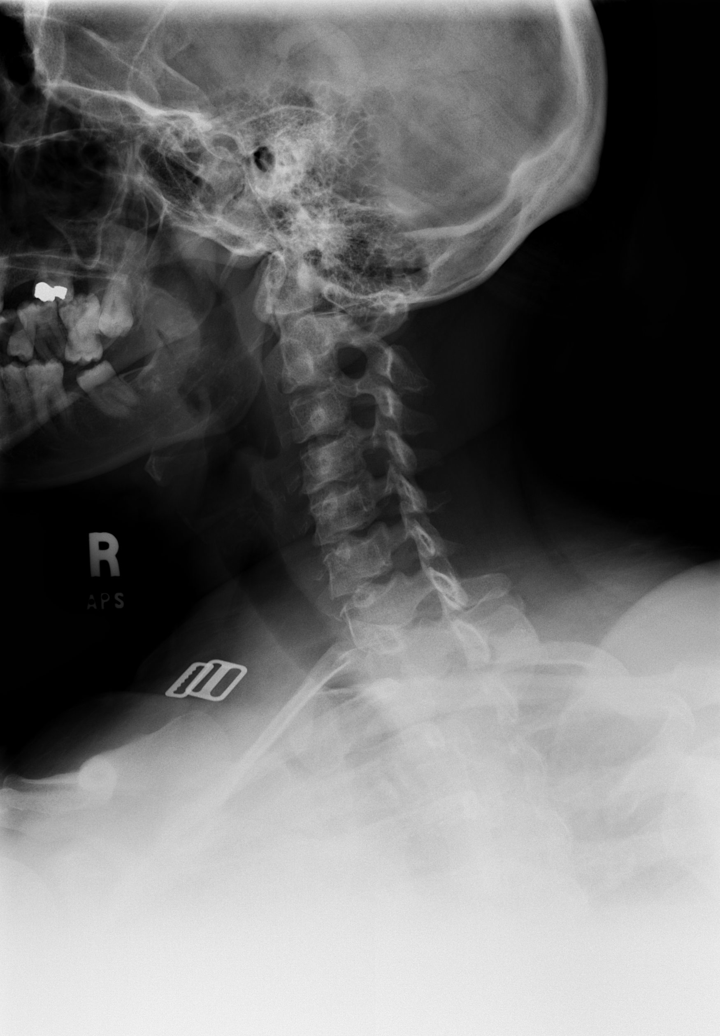

[w c-spine a.p. *]
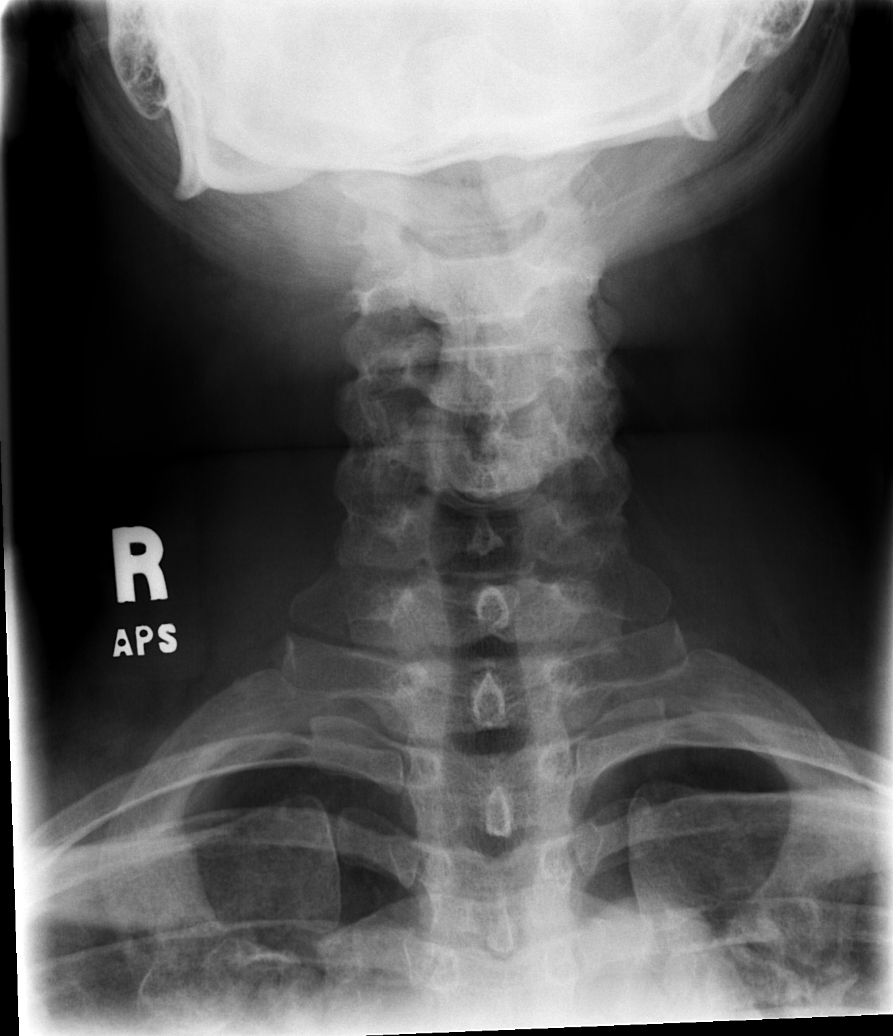

[w c-spine odontoid *]
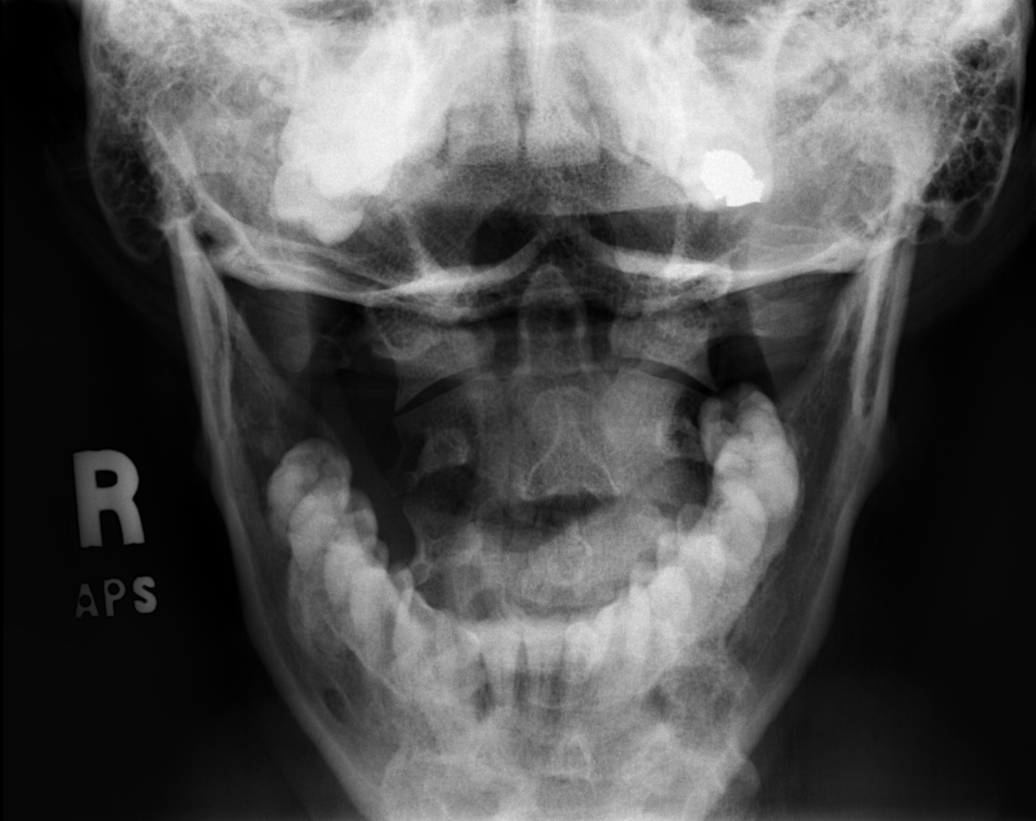

[5 of 5 positions shown; findings below may reference images not displayed]

IMPRESSION: 1. Negative for fracture or other acute bone injury.
2. Early anterior endplate spurring C4-C6.
3. Straightening of the normal lordosis, which may be due to positioning, spasm,
or soft tissue injury.

Pelvis one view:

No previous for comparison. Negative for fracture, dislocation, or other acute
bone injury. Phleboliths in the lower pelvis. IUD noted.
IMPRESSION: 1. Negative for acute bone abnormality

## 2006-09-23 DIAGNOSIS — E785 Hyperlipidemia, unspecified: Secondary | ICD-10-CM | POA: Insufficient documentation

## 2006-09-23 DIAGNOSIS — I10 Essential (primary) hypertension: Secondary | ICD-10-CM | POA: Insufficient documentation

## 2006-09-23 DIAGNOSIS — G43909 Migraine, unspecified, not intractable, without status migrainosus: Secondary | ICD-10-CM | POA: Insufficient documentation

## 2006-09-24 ENCOUNTER — Encounter (INDEPENDENT_AMBULATORY_CARE_PROVIDER_SITE_OTHER): Payer: Self-pay | Admitting: *Deleted

## 2006-10-25 ENCOUNTER — Ambulatory Visit: Payer: Self-pay | Admitting: Sports Medicine

## 2006-11-09 ENCOUNTER — Ambulatory Visit: Payer: Self-pay | Admitting: Family Medicine

## 2006-11-09 ENCOUNTER — Encounter (INDEPENDENT_AMBULATORY_CARE_PROVIDER_SITE_OTHER): Payer: Self-pay | Admitting: Family Medicine

## 2006-11-09 LAB — CONVERTED CEMR LAB
BUN: 10 mg/dL (ref 6–23)
Calcium: 9 mg/dL (ref 8.4–10.5)
Potassium: 4 meq/L (ref 3.5–5.3)

## 2006-12-23 ENCOUNTER — Encounter: Payer: Self-pay | Admitting: *Deleted

## 2006-12-27 ENCOUNTER — Ambulatory Visit: Payer: Self-pay | Admitting: Sports Medicine

## 2007-02-07 ENCOUNTER — Ambulatory Visit: Payer: Self-pay | Admitting: Family Medicine

## 2007-02-07 LAB — CONVERTED CEMR LAB: Rapid Strep: NEGATIVE

## 2007-03-07 ENCOUNTER — Encounter: Payer: Self-pay | Admitting: Family Medicine

## 2007-03-07 ENCOUNTER — Ambulatory Visit: Payer: Self-pay | Admitting: Family Medicine

## 2007-03-07 ENCOUNTER — Other Ambulatory Visit: Admission: RE | Admit: 2007-03-07 | Discharge: 2007-03-07 | Payer: Self-pay | Admitting: Emergency Medicine

## 2007-03-07 LAB — CONVERTED CEMR LAB: Pap Smear: NORMAL

## 2007-05-23 ENCOUNTER — Encounter (INDEPENDENT_AMBULATORY_CARE_PROVIDER_SITE_OTHER): Payer: Self-pay | Admitting: *Deleted

## 2007-05-23 ENCOUNTER — Telehealth (INDEPENDENT_AMBULATORY_CARE_PROVIDER_SITE_OTHER): Payer: Self-pay | Admitting: *Deleted

## 2007-05-23 ENCOUNTER — Ambulatory Visit: Payer: Self-pay | Admitting: Family Medicine

## 2007-05-24 LAB — CONVERTED CEMR LAB
ALT: 12 units/L (ref 0–35)
AST: 16 units/L (ref 0–37)
Albumin: 4 g/dL (ref 3.5–5.2)
Alkaline Phosphatase: 100 units/L (ref 39–117)
Calcium: 9.2 mg/dL (ref 8.4–10.5)
Glucose, Bld: 95 mg/dL (ref 70–99)
Lipase: <10 units/L (ref 0–75)
Potassium: 3.8 meq/L (ref 3.5–5.3)
Total Bilirubin: 0.4 mg/dL (ref 0.3–1.2)
Total Protein: 7.7 g/dL (ref 6.0–8.3)

## 2007-05-26 ENCOUNTER — Ambulatory Visit (HOSPITAL_COMMUNITY): Admission: RE | Admit: 2007-05-26 | Discharge: 2007-05-26 | Payer: Self-pay | Admitting: Family Medicine

## 2007-05-26 ENCOUNTER — Ambulatory Visit: Payer: Self-pay | Admitting: Family Medicine

## 2007-05-26 ENCOUNTER — Encounter (INDEPENDENT_AMBULATORY_CARE_PROVIDER_SITE_OTHER): Payer: Self-pay | Admitting: *Deleted

## 2007-05-31 ENCOUNTER — Ambulatory Visit: Payer: Self-pay | Admitting: Family Medicine

## 2007-05-31 ENCOUNTER — Encounter: Payer: Self-pay | Admitting: Family Medicine

## 2007-06-01 ENCOUNTER — Encounter: Payer: Self-pay | Admitting: Family Medicine

## 2007-06-07 ENCOUNTER — Encounter: Payer: Self-pay | Admitting: Family Medicine

## 2007-06-07 LAB — CONVERTED CEMR LAB
BUN: 9 mg/dL (ref 6–23)
HDL: 32 mg/dL — ABNORMAL LOW (ref >39)
Potassium: 3.7 meq/L (ref 3.5–5.3)
Sodium: 140 meq/L (ref 135–145)
Total CHOL/HDL Ratio: 5.7
Triglycerides: 149 mg/dL (ref <150)
VLDL: 30 mg/dL (ref 0–40)

## 2007-06-08 ENCOUNTER — Encounter: Payer: Self-pay | Admitting: Family Medicine

## 2007-06-09 ENCOUNTER — Encounter: Admission: RE | Admit: 2007-06-09 | Discharge: 2007-06-09 | Payer: Self-pay | Admitting: Sports Medicine

## 2007-06-09 IMAGING — CR DG KNEE 1-2V*L*
2 series · 2 of 2 positions shown · non-contrast
Comparison: None.

CLINICAL DATA: Knee pain.
 LEFT KNEE ? 2 VIEWS:

[view not recorded (1 of 2)]
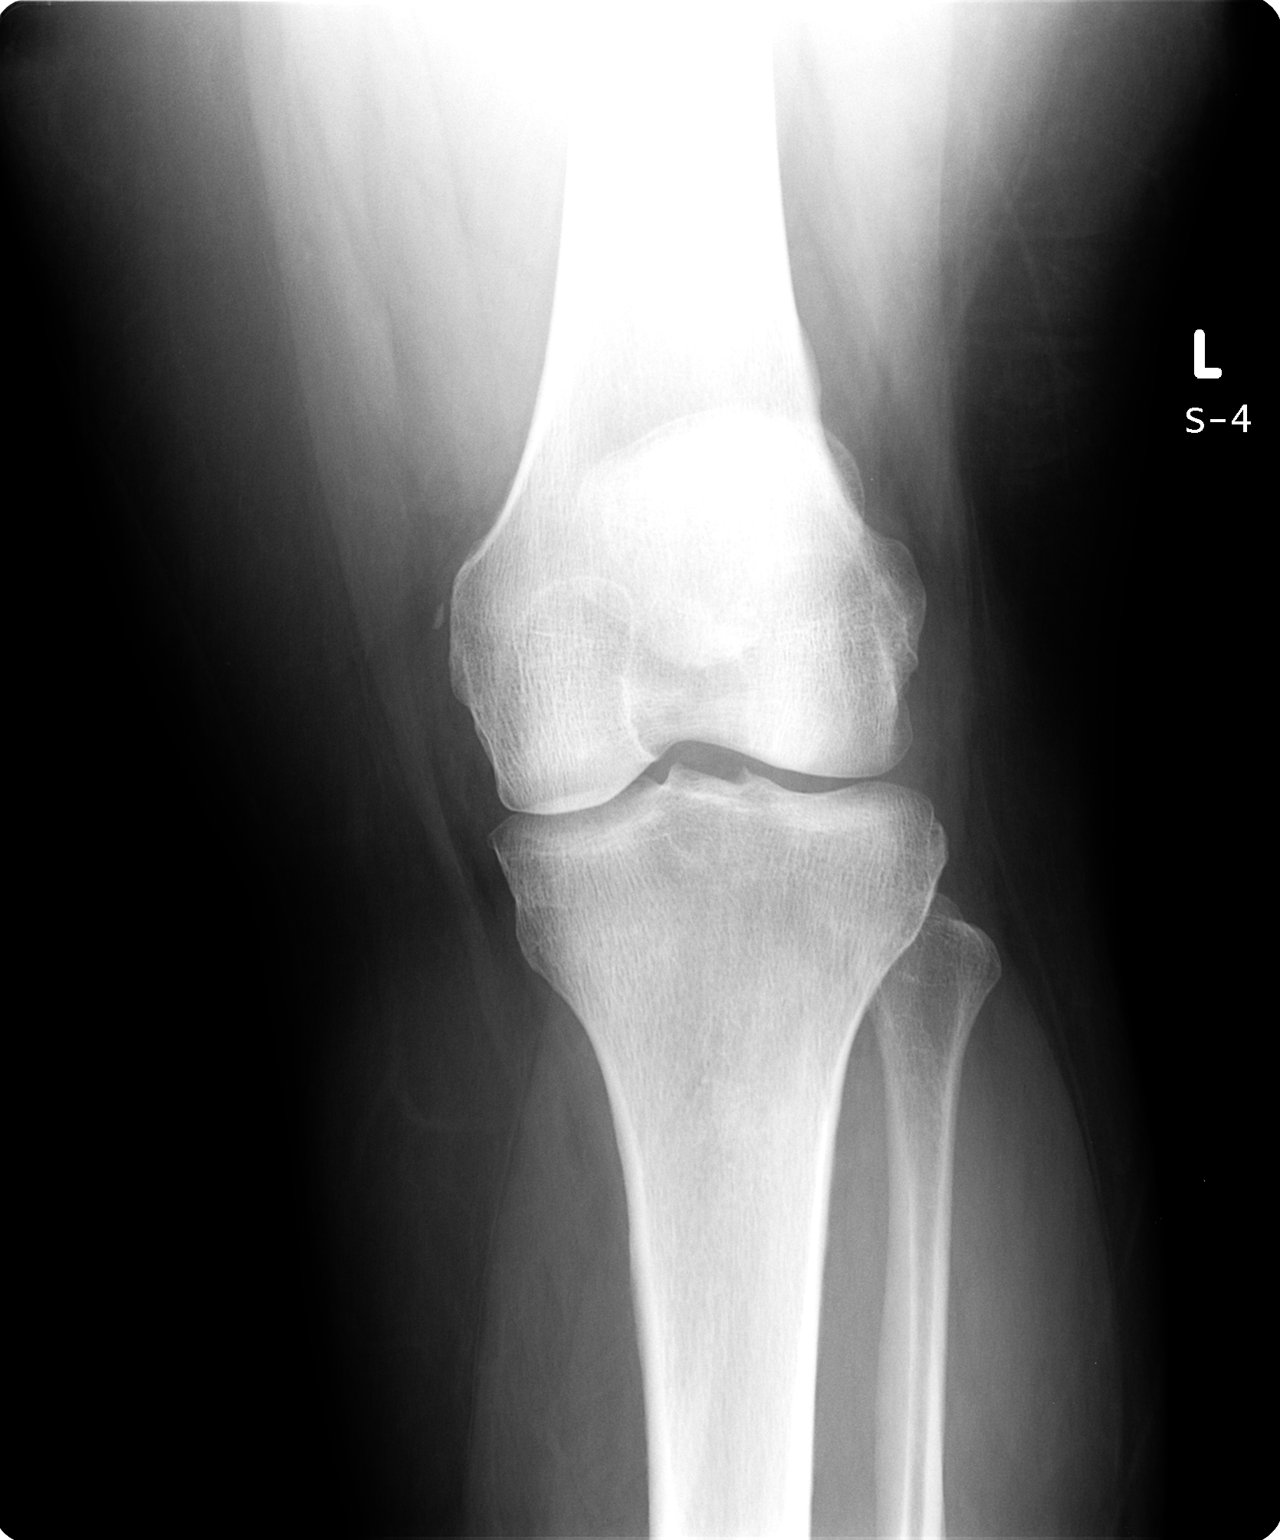

[view not recorded (2 of 2)]
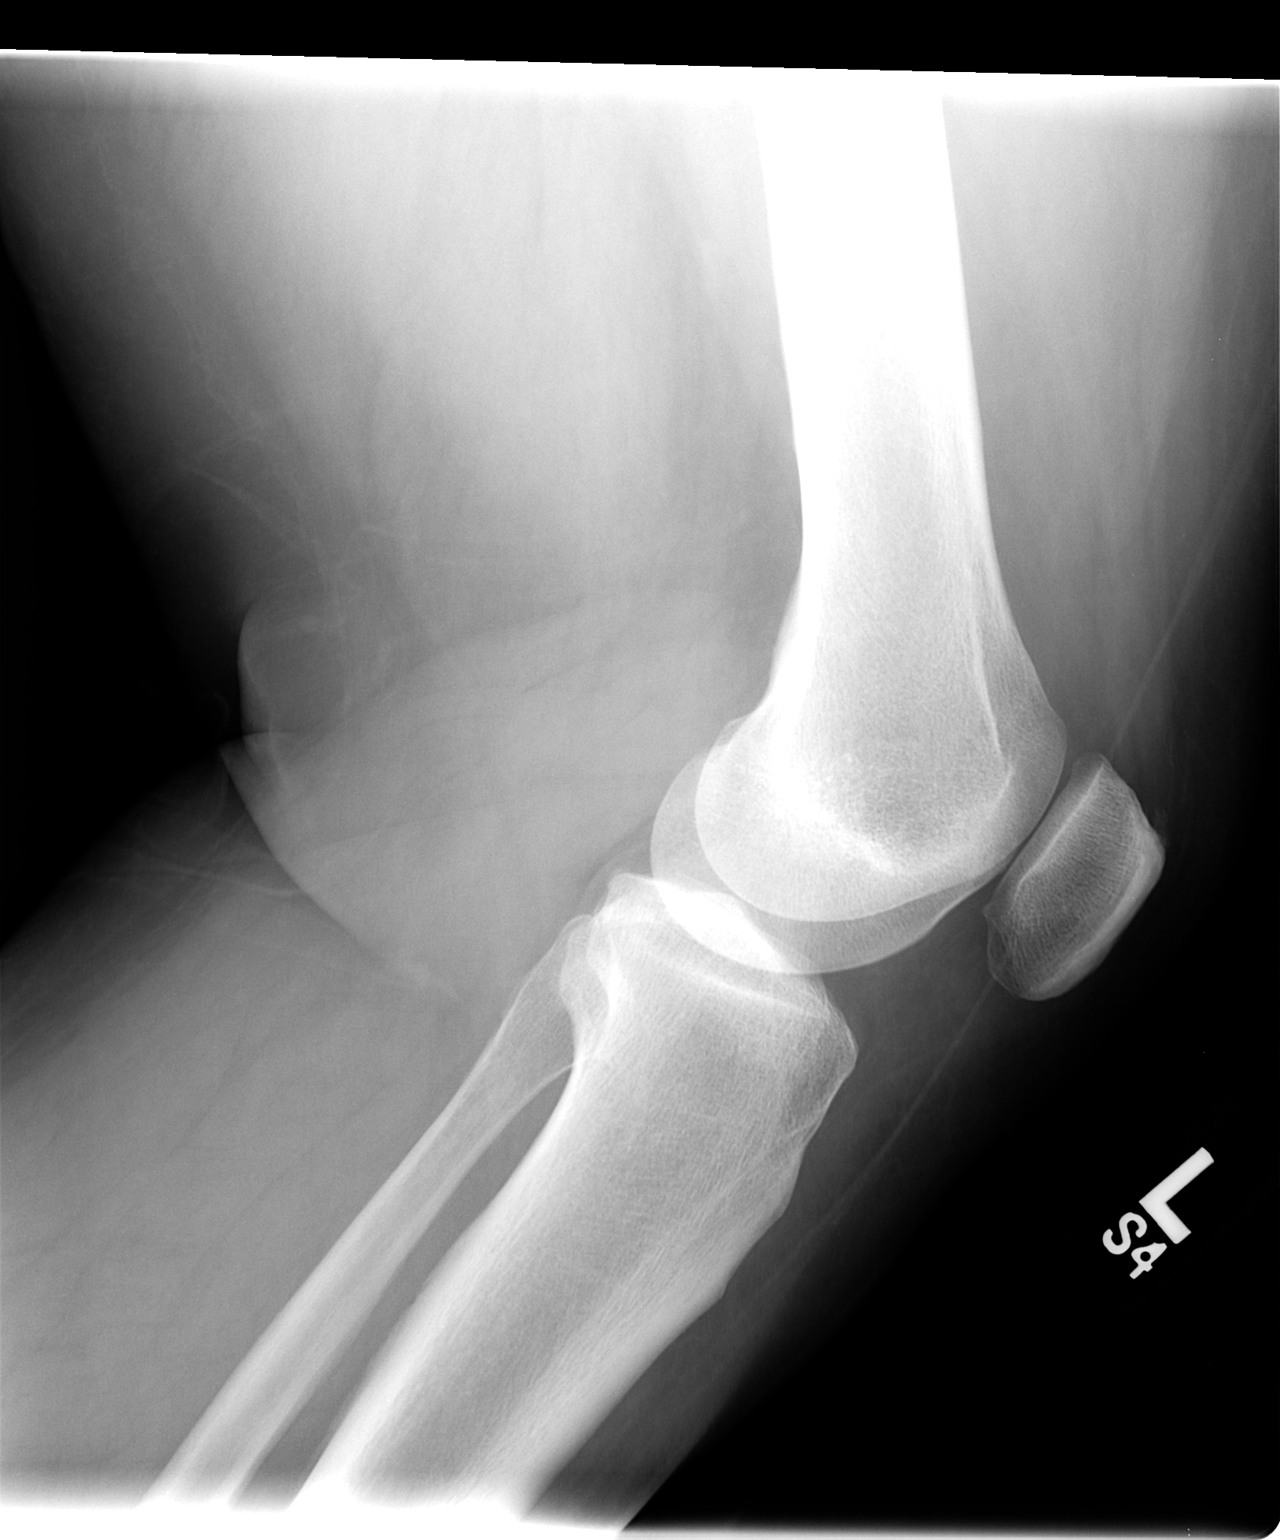

[2 of 2 positions shown; findings below may reference images not displayed]

Normal alignment and no fracture.  There is a small joint effusion.  There is a small amount of calcification in the medial collateral ligament adjacent to the femoral condyle compatible with prior medial collateral ligament injury.  The joint spaces are well maintained.
IMPRESSION: Small joint effusion.  Negative for fracture.
 Prior medial collateral ligament injury.

## 2007-08-05 ENCOUNTER — Ambulatory Visit: Payer: Self-pay | Admitting: Family Medicine

## 2007-08-05 ENCOUNTER — Encounter: Payer: Self-pay | Admitting: Family Medicine

## 2007-08-05 LAB — CONVERTED CEMR LAB
AST: 15 units/L (ref 0–37)
Alkaline Phosphatase: 100 units/L (ref 39–117)
Blood Glucose, Fingerstick: 95
Calcium: 9.2 mg/dL (ref 8.4–10.5)
Creatinine, Ser: 0.71 mg/dL (ref 0.40–1.20)
Sodium: 140 meq/L (ref 135–145)
Total Bilirubin: 0.5 mg/dL (ref 0.3–1.2)
Total CHOL/HDL Ratio: 4.8
Total Protein: 7.7 g/dL (ref 6.0–8.3)

## 2007-08-08 ENCOUNTER — Encounter: Payer: Self-pay | Admitting: Family Medicine

## 2007-12-30 ENCOUNTER — Telehealth (INDEPENDENT_AMBULATORY_CARE_PROVIDER_SITE_OTHER): Payer: Self-pay | Admitting: Family Medicine

## 2008-01-26 ENCOUNTER — Ambulatory Visit: Payer: Self-pay | Admitting: Family Medicine

## 2008-01-26 ENCOUNTER — Encounter: Payer: Self-pay | Admitting: Family Medicine

## 2008-01-26 LAB — CONVERTED CEMR LAB
ALT: 16 units/L (ref 0–35)
AST: 16 units/L (ref 0–37)
Albumin: 4 g/dL (ref 3.5–5.2)
Alkaline Phosphatase: 93 units/L (ref 39–117)
BUN: 10 mg/dL (ref 6–23)
Calcium: 9.5 mg/dL (ref 8.4–10.5)
Chloride: 105 meq/L (ref 96–112)
Glucose, Bld: 99 mg/dL (ref 70–99)
Potassium: 4 meq/L (ref 3.5–5.3)
Sodium: 140 meq/L (ref 135–145)

## 2008-02-27 ENCOUNTER — Ambulatory Visit: Payer: Self-pay | Admitting: Family Medicine

## 2008-03-16 ENCOUNTER — Encounter (INDEPENDENT_AMBULATORY_CARE_PROVIDER_SITE_OTHER): Payer: Self-pay | Admitting: *Deleted

## 2008-03-16 ENCOUNTER — Encounter: Payer: Self-pay | Admitting: Family Medicine

## 2008-03-16 ENCOUNTER — Other Ambulatory Visit: Admission: RE | Admit: 2008-03-16 | Discharge: 2008-03-16 | Payer: Self-pay | Admitting: Family Medicine

## 2008-03-16 ENCOUNTER — Ambulatory Visit: Payer: Self-pay | Admitting: Family Medicine

## 2008-03-19 ENCOUNTER — Ambulatory Visit: Payer: Self-pay | Admitting: Sports Medicine

## 2008-03-19 ENCOUNTER — Encounter: Payer: Self-pay | Admitting: Family Medicine

## 2008-03-19 LAB — CONVERTED CEMR LAB
Chlamydia, DNA Probe: NEGATIVE
Direct LDL: 175 mg/dL — ABNORMAL HIGH
GC Probe Amp, Genital: NEGATIVE

## 2008-03-20 ENCOUNTER — Encounter: Payer: Self-pay | Admitting: *Deleted

## 2008-03-22 ENCOUNTER — Encounter: Payer: Self-pay | Admitting: Family Medicine

## 2008-03-22 LAB — CONVERTED CEMR LAB: Pap Smear: ABNORMAL

## 2008-03-27 ENCOUNTER — Encounter: Payer: Self-pay | Admitting: Family Medicine

## 2008-04-12 ENCOUNTER — Encounter: Payer: Self-pay | Admitting: Family Medicine

## 2008-04-26 ENCOUNTER — Ambulatory Visit: Payer: Self-pay | Admitting: Obstetrics & Gynecology

## 2008-04-30 ENCOUNTER — Ambulatory Visit: Payer: Self-pay | Admitting: Family Medicine

## 2008-05-07 ENCOUNTER — Ambulatory Visit: Payer: Self-pay | Admitting: Family Medicine

## 2008-05-07 ENCOUNTER — Encounter (INDEPENDENT_AMBULATORY_CARE_PROVIDER_SITE_OTHER): Payer: Self-pay | Admitting: Family Medicine

## 2008-09-17 ENCOUNTER — Encounter: Payer: Self-pay | Admitting: Family Medicine

## 2008-11-01 ENCOUNTER — Ambulatory Visit: Payer: Self-pay | Admitting: Family Medicine

## 2008-11-01 ENCOUNTER — Ambulatory Visit: Payer: Self-pay | Admitting: Internal Medicine

## 2008-11-01 ENCOUNTER — Observation Stay (HOSPITAL_COMMUNITY): Admission: AD | Admit: 2008-11-01 | Discharge: 2008-11-02 | Payer: Self-pay | Admitting: Family Medicine

## 2008-11-01 IMAGING — CR DG CHEST 2V
2 series · 2 of 2 positions shown · non-contrast
Comparison: None available.

CLINICAL DATA: Chest pain and shortness of breath.  History
hypertension.  Morbid obesity.

CHEST - 2 VIEW

[w chest pa *]
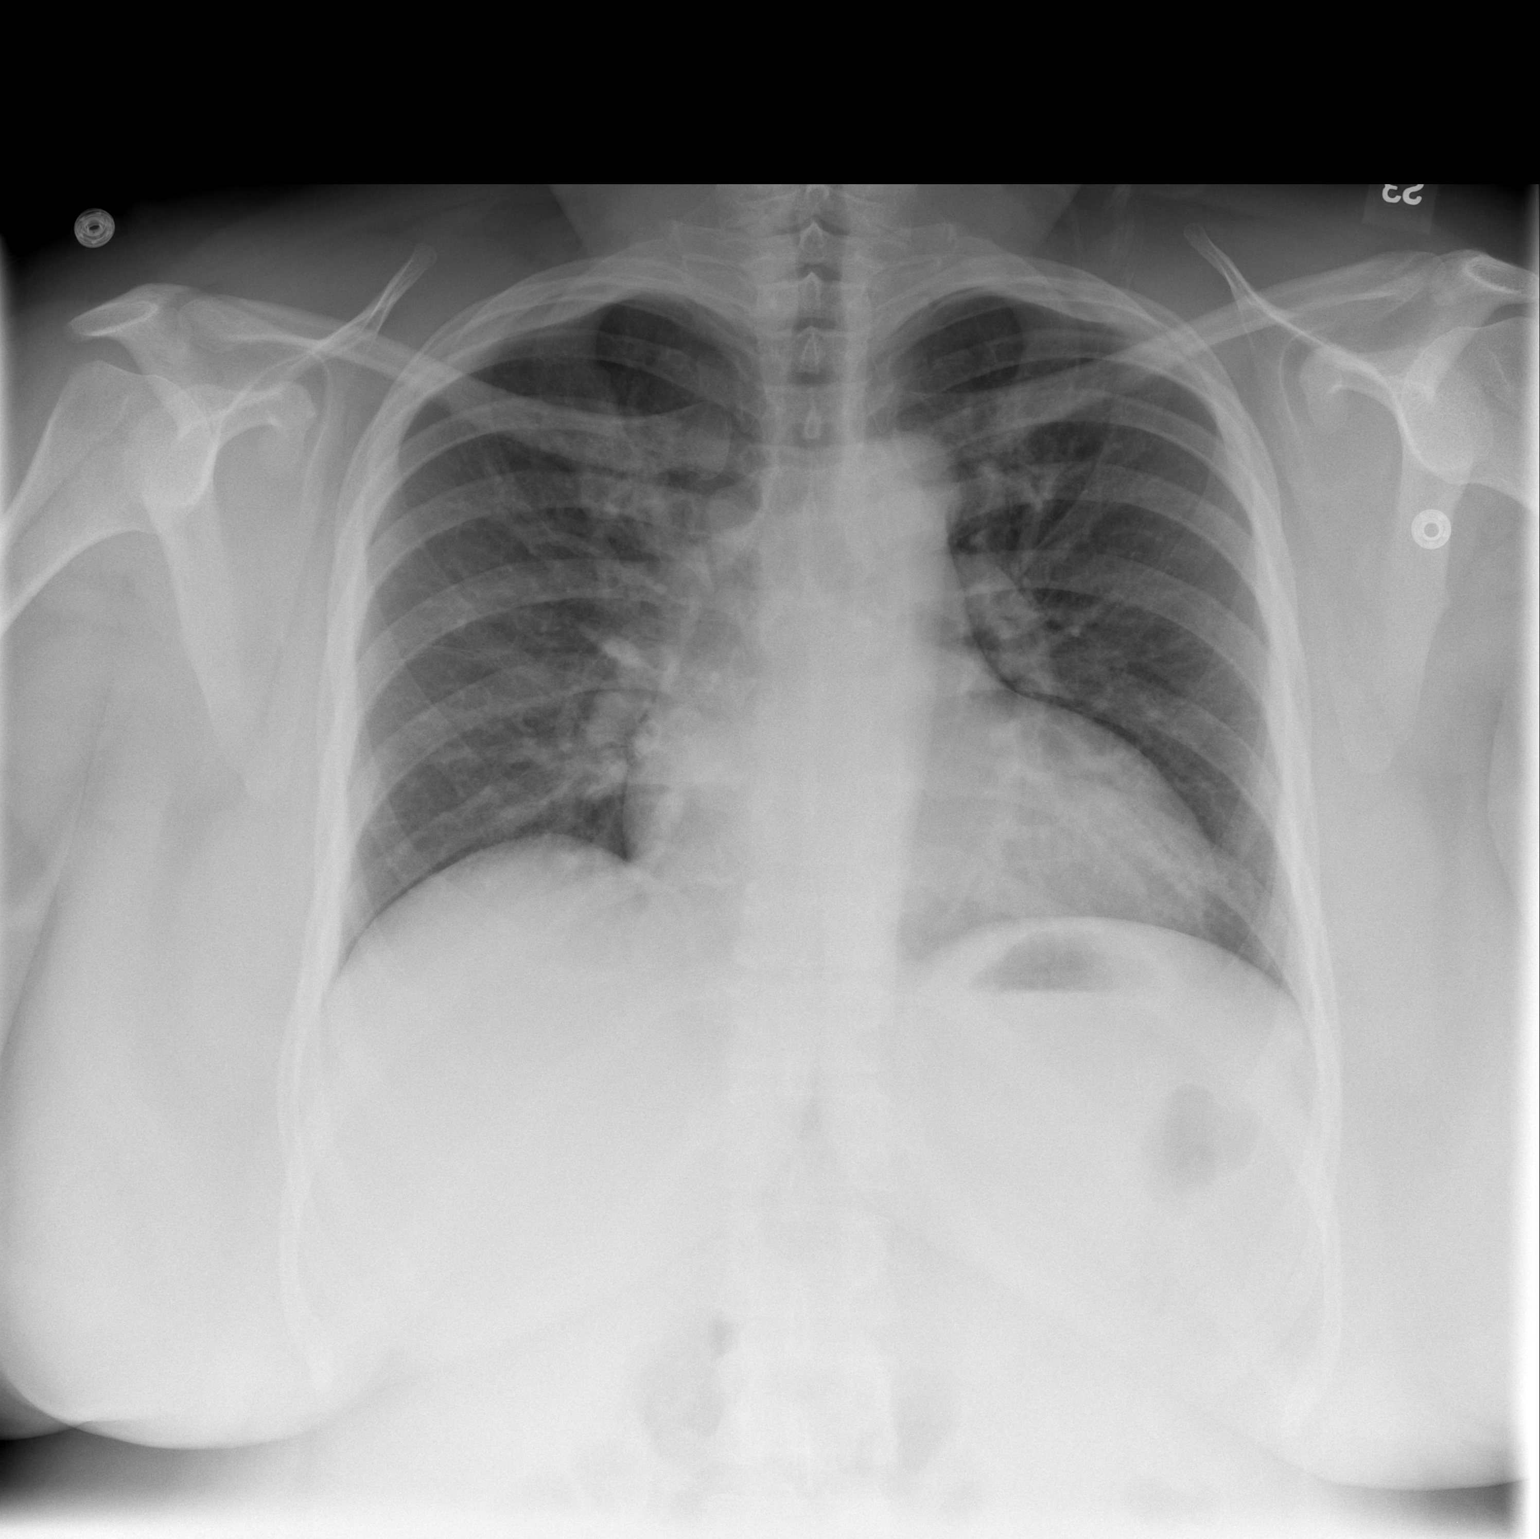

[w chest lat *]
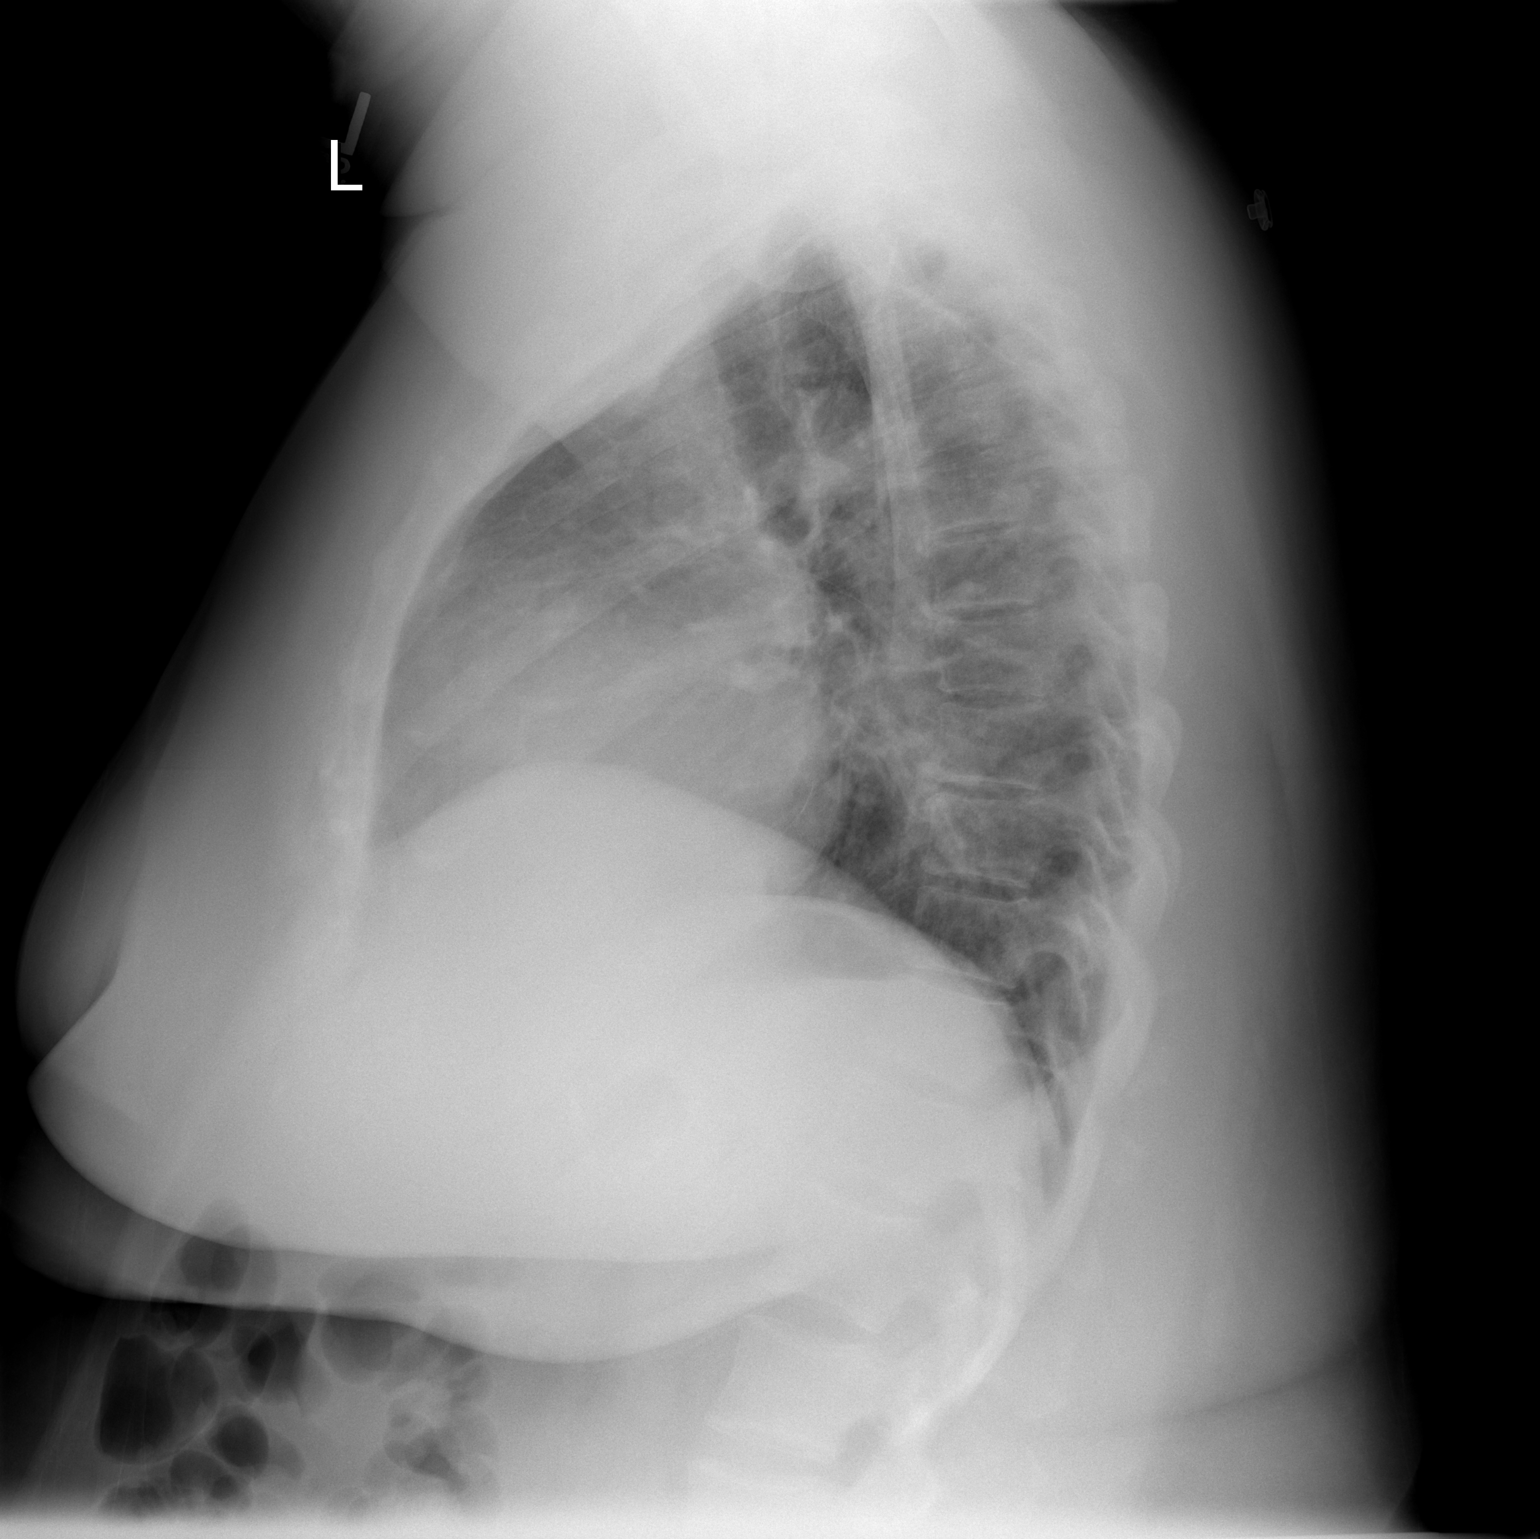

[2 of 2 positions shown; findings below may reference images not displayed]

FINDINGS: Cardiomegaly is present, which is made more pronounced by
low lung volumes.  Mild prominence of the central markings without
lobar consolidation or overt failure    I see no effusion or
pneumothorax.  Osseous structures grossly unremarkable.
IMPRESSION: Cardiomegaly.  Question mild vascular congestion.

## 2008-11-02 ENCOUNTER — Encounter: Payer: Self-pay | Admitting: Family Medicine

## 2008-11-06 ENCOUNTER — Encounter: Payer: Self-pay | Admitting: *Deleted

## 2008-11-06 ENCOUNTER — Ambulatory Visit: Payer: Self-pay | Admitting: Family Medicine

## 2008-11-06 DIAGNOSIS — J329 Chronic sinusitis, unspecified: Secondary | ICD-10-CM | POA: Insufficient documentation

## 2008-11-13 ENCOUNTER — Ambulatory Visit: Payer: Self-pay | Admitting: Cardiovascular Disease

## 2008-11-13 ENCOUNTER — Encounter: Payer: Self-pay | Admitting: Cardiovascular Disease

## 2008-11-15 ENCOUNTER — Ambulatory Visit: Payer: Self-pay | Admitting: Family Medicine

## 2008-11-26 ENCOUNTER — Telehealth (INDEPENDENT_AMBULATORY_CARE_PROVIDER_SITE_OTHER): Payer: Self-pay | Admitting: *Deleted

## 2008-11-27 ENCOUNTER — Encounter: Payer: Self-pay | Admitting: Cardiovascular Disease

## 2008-11-27 ENCOUNTER — Ambulatory Visit: Payer: Self-pay

## 2008-12-03 ENCOUNTER — Ambulatory Visit: Payer: Self-pay

## 2008-12-10 ENCOUNTER — Encounter (INDEPENDENT_AMBULATORY_CARE_PROVIDER_SITE_OTHER): Payer: Self-pay | Admitting: *Deleted

## 2009-02-18 ENCOUNTER — Telehealth: Payer: Self-pay | Admitting: Family Medicine

## 2009-02-18 ENCOUNTER — Ambulatory Visit: Payer: Self-pay | Admitting: Family Medicine

## 2009-02-18 DIAGNOSIS — K5909 Other constipation: Secondary | ICD-10-CM | POA: Insufficient documentation

## 2009-03-16 ENCOUNTER — Emergency Department (HOSPITAL_COMMUNITY): Admission: EM | Admit: 2009-03-16 | Discharge: 2009-03-16 | Payer: Self-pay | Admitting: Emergency Medicine
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
## 2009-04-05 ENCOUNTER — Encounter: Payer: Self-pay | Admitting: Family Medicine

## 2009-04-05 ENCOUNTER — Other Ambulatory Visit: Admission: RE | Admit: 2009-04-05 | Discharge: 2009-04-05 | Payer: Self-pay | Admitting: Family Medicine

## 2009-04-05 ENCOUNTER — Ambulatory Visit: Payer: Self-pay | Admitting: Family Medicine

## 2009-04-05 LAB — CONVERTED CEMR LAB: GC Probe Amp, Genital: NEGATIVE

## 2009-04-08 LAB — CONVERTED CEMR LAB
AST: 11 units/L (ref 0–37)
Alkaline Phosphatase: 83 units/L (ref 39–117)
Chloride: 105 meq/L (ref 96–112)
Cholesterol: 253 mg/dL — ABNORMAL HIGH (ref 0–200)
Creatinine, Ser: 0.69 mg/dL (ref 0.40–1.20)
Glucose, Bld: 81 mg/dL (ref 70–99)
LDL Cholesterol: 186 mg/dL — ABNORMAL HIGH (ref 0–99)
Sodium: 141 meq/L (ref 135–145)
Total Protein: 7.7 g/dL (ref 6.0–8.3)
Triglycerides: 157 mg/dL — ABNORMAL HIGH (ref <150)
VLDL: 31 mg/dL (ref 0–40)

## 2009-04-15 ENCOUNTER — Encounter: Admission: RE | Admit: 2009-04-15 | Discharge: 2009-05-14 | Payer: Self-pay | Admitting: Family Medicine

## 2009-05-20 ENCOUNTER — Ambulatory Visit: Payer: Self-pay | Admitting: Family Medicine

## 2009-07-16 ENCOUNTER — Encounter: Payer: Self-pay | Admitting: *Deleted

## 2009-07-27 HISTORY — PX: BREAST BIOPSY: SHX20

## 2009-09-06 ENCOUNTER — Encounter: Payer: Self-pay | Admitting: Family Medicine

## 2009-09-06 ENCOUNTER — Ambulatory Visit: Payer: Self-pay | Admitting: Family Medicine

## 2009-09-10 ENCOUNTER — Encounter: Payer: Self-pay | Admitting: Family Medicine

## 2009-09-11 ENCOUNTER — Encounter: Admission: RE | Admit: 2009-09-11 | Discharge: 2009-09-11 | Payer: Self-pay | Admitting: Family Medicine

## 2009-09-11 ENCOUNTER — Telehealth: Payer: Self-pay | Admitting: Family Medicine

## 2009-09-11 IMAGING — MG MM DIGITAL DIAGNOSTIC BILAT
8 of 9 series · 8 of 9 positions shown · non-contrast
Comparison: None.

<div class=Section[DATE] –DUPLICATE COPY for exam association in [facility]. No change from original report.
CLINICAL DATA: Mass felt by the patient in the right axilla for
 approximately 3 months. She reports that this has progressively
 increased in size during that time. She initially had a similar
 mass in the left <span MAXI=MAXI>axilla</span>. She was able to extrude pus and blood
 from both of these, with subsequent resolution of the mass on the
 left. <span class=GramE>Previous bilateral breast reductions in [5R].</span>

 DIGITAL DIAGNOSTIC BILATERAL MAMMOGRAM WITH CAD AND RIGHT BREAST
 ULTRASOUND:

[R CC (1 of 2)]
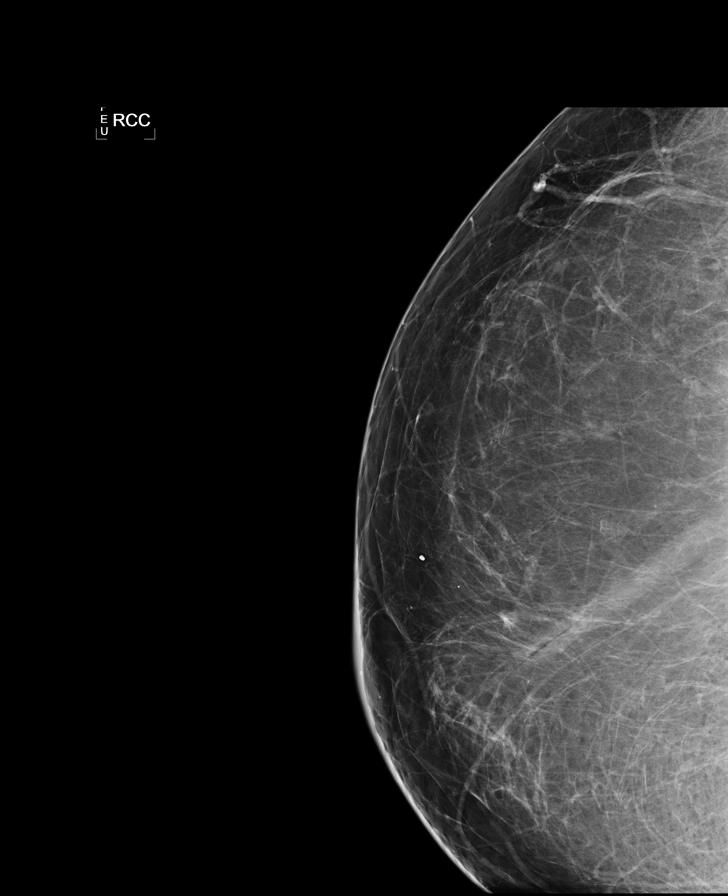

[L CC (1 of 2)]
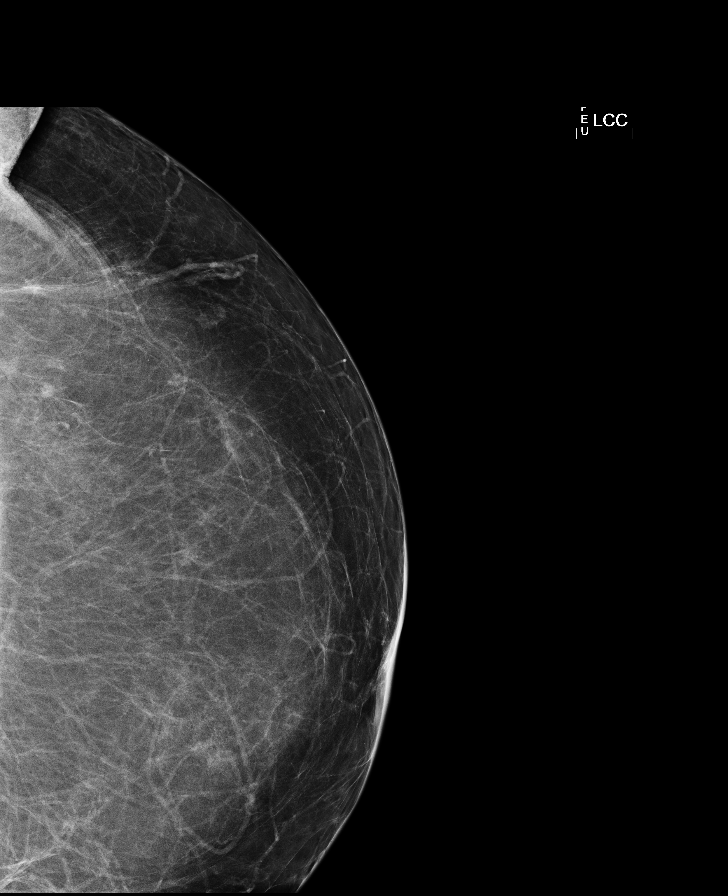

[L MLO (1 of 2)]
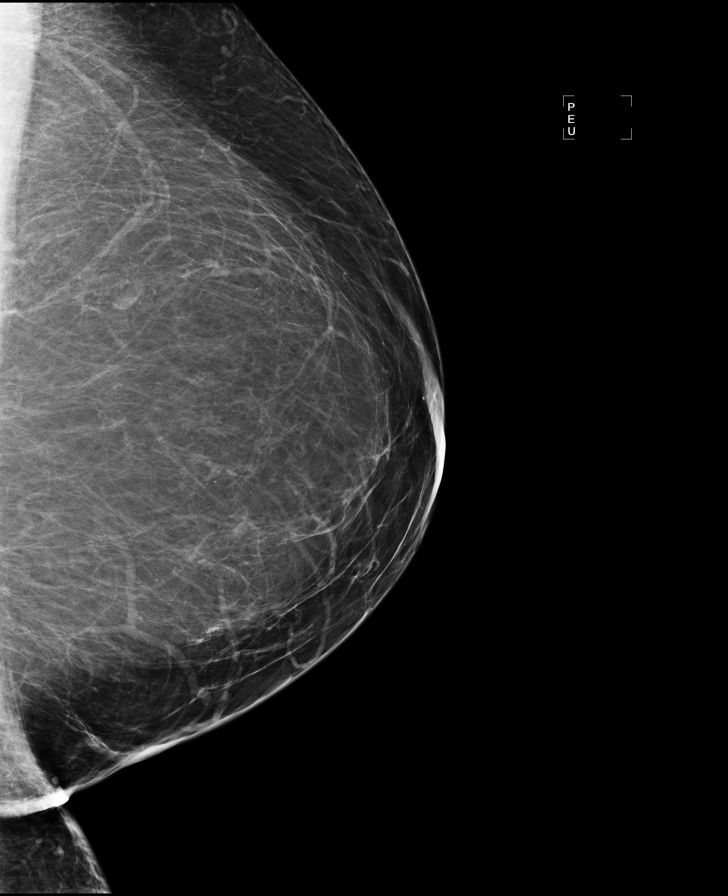

[R MLO (1 of 2)]
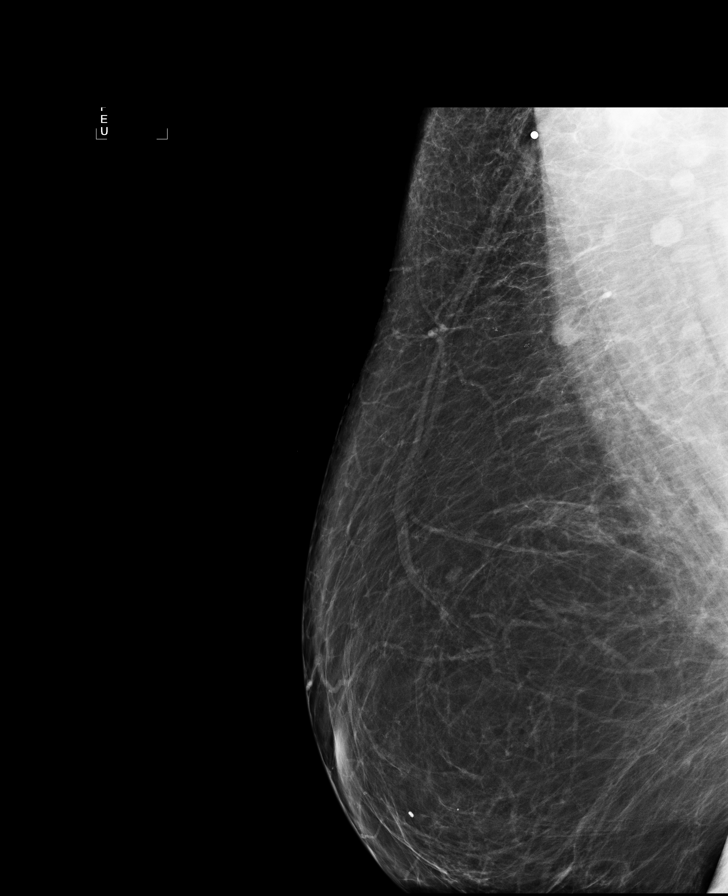

[R CC (2 of 2)]
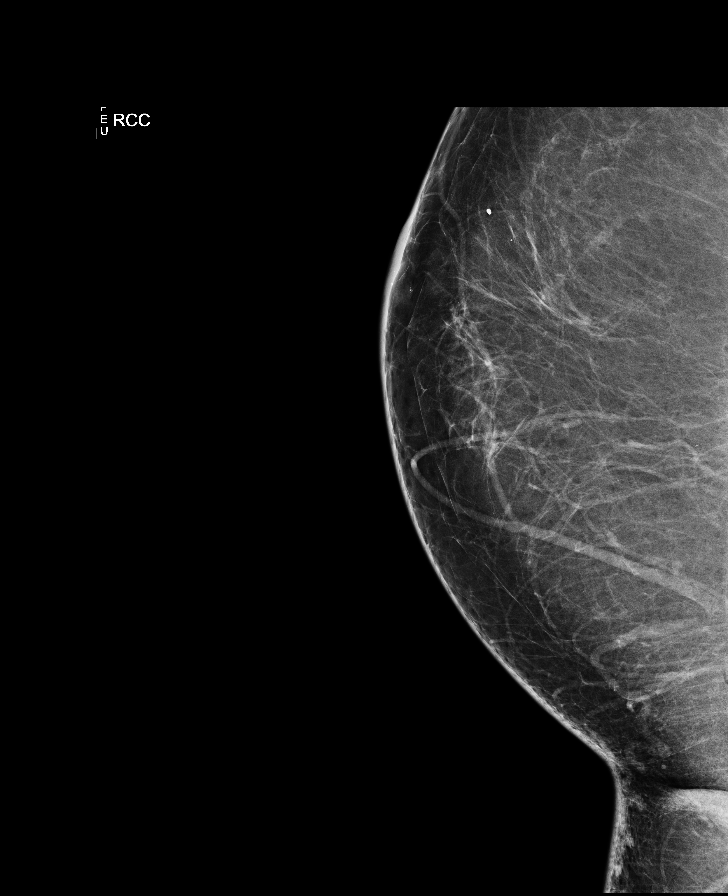

[R MLO (2 of 2)]
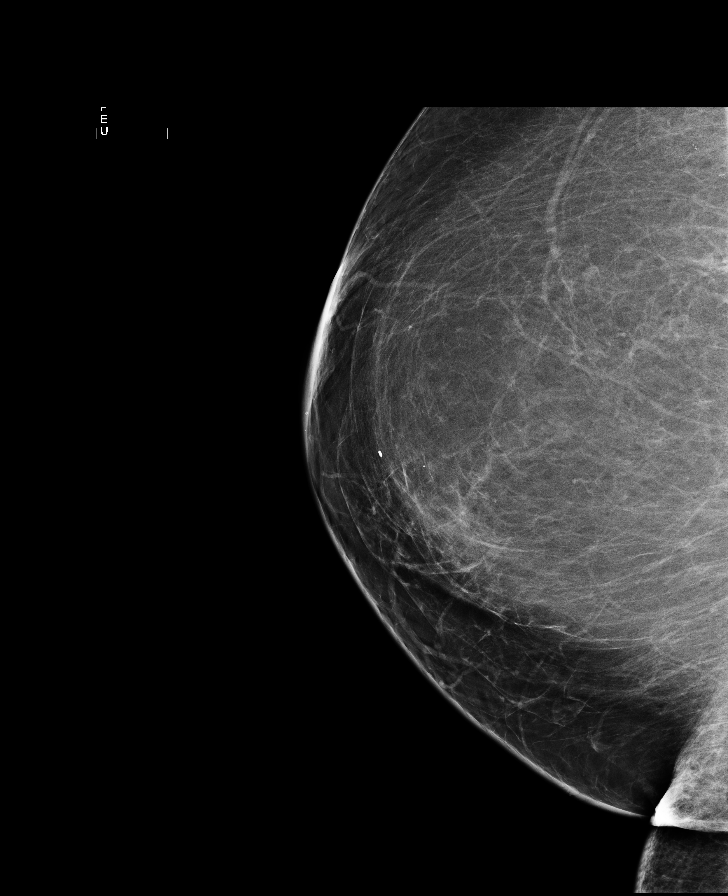

[L CC (2 of 2)]
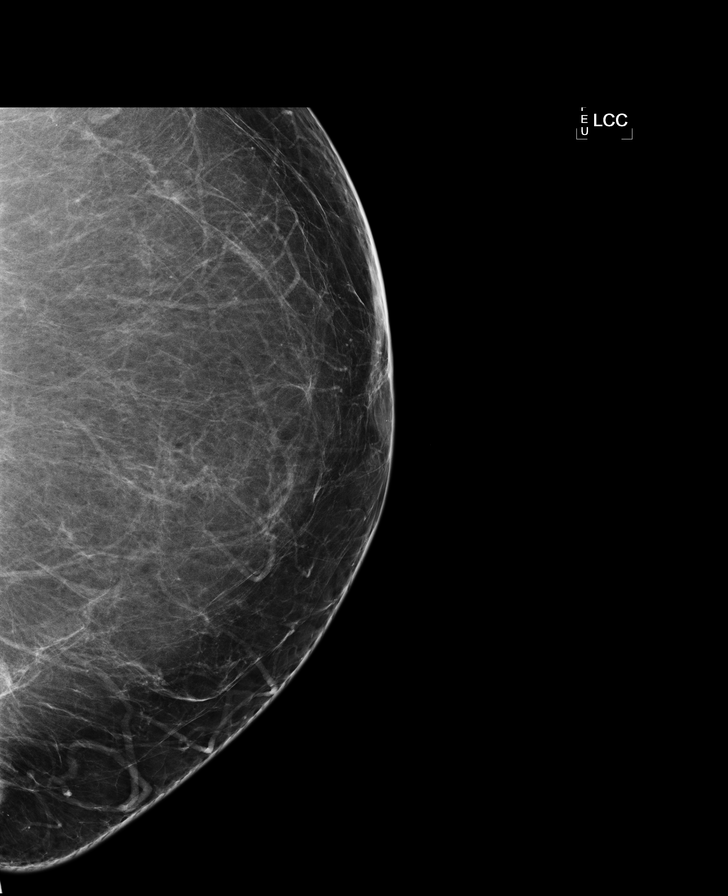

[L MLO (2 of 2)]
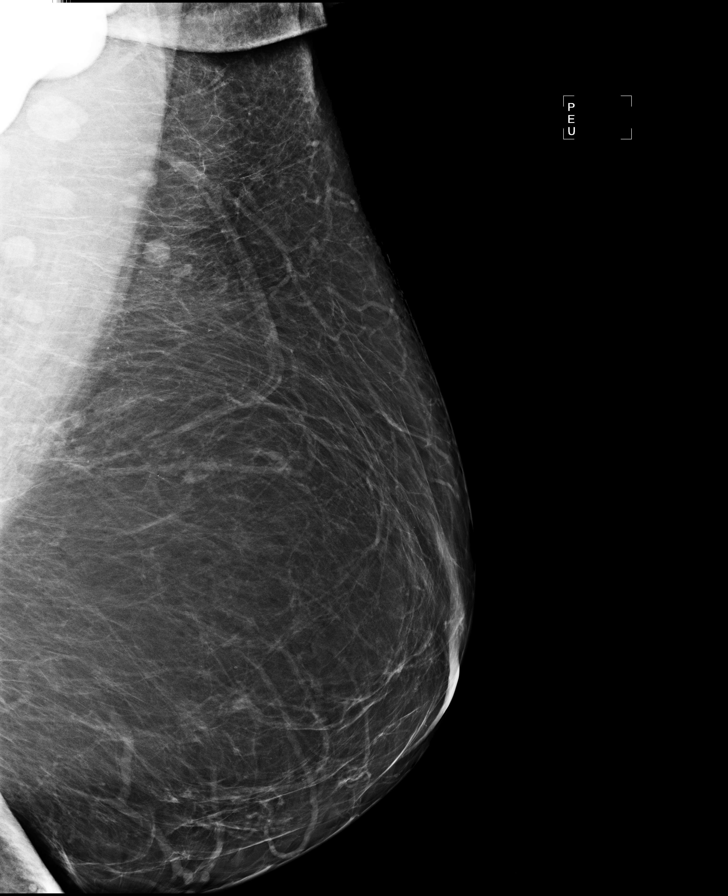

[8 of 9 positions shown; findings below may reference images not displayed]

FINDINGS: Predominately fatty tissue in both breasts. Post
 reduction changes bilaterally. Irregular mass-like density in the
 right <span MAXI=MAXI>axilla</span>, corresponding to the palpable mass, marked with a
 metallic marker. This is difficult to visualize due to the
 thickness of the overlying soft tissues. No findings suspicious
 for malignancy elsewhere in either breast.
 Mammographic images were processed with CAD.

 On physical exam, the patient has a an approximately 2.5 cm firm<span class=GramE>,</span>
 palpable mass in the inferior right <span MAXI=MAXI>axilla</span>. No overlying skin
 redness. The mass is tender to palpation.

 Ultrasound is performed, showing a 2.3 x 2.0 x 1.1 cm oval<span class=GramE>,</span>
 horizontally oriented, <span MAXI=MAXI>hypoechoic</span> mass in the inferior right
 <span MAXI=MAXI>axilla</span>. There is a suggestion that this contains a fatty notch.
 No internal blood flow is seen with color Doppler. This is located
 approximately 1 cm below the skin surface.

 IMPRESSION<span class=GramE>:</span>
 2.3 x 2.0 x 1.1 cm <span MAXI=MAXI>hypoechoic</span> mass in the inferior right <span MAXI=MAXI>axilla</span>.
 This could represent an enlarged lymph node or abscess. Ultrasound-
 guided cyst aspiration and/or core needle biopsy is recommended.
 This has been scheduled for [DATE] a.m. on [DATE].

 BI-RADS CATEGORY 4: Suspicious abnormality - biopsy should be
 considered.
 </div>

## 2009-09-12 ENCOUNTER — Encounter: Admission: RE | Admit: 2009-09-12 | Discharge: 2009-09-12 | Payer: Self-pay | Admitting: Family Medicine

## 2009-09-12 ENCOUNTER — Ambulatory Visit: Payer: Self-pay | Admitting: Family Medicine

## 2009-09-13 ENCOUNTER — Encounter: Payer: Self-pay | Admitting: Family Medicine

## 2009-09-16 ENCOUNTER — Encounter: Payer: Self-pay | Admitting: Family Medicine

## 2009-09-26 ENCOUNTER — Encounter: Admission: RE | Admit: 2009-09-26 | Discharge: 2009-09-26 | Payer: Self-pay | Admitting: Family Medicine

## 2009-10-15 ENCOUNTER — Encounter: Payer: Self-pay | Admitting: Family Medicine

## 2009-10-18 ENCOUNTER — Encounter: Payer: Self-pay | Admitting: Family Medicine

## 2009-10-22 ENCOUNTER — Ambulatory Visit: Payer: Self-pay | Admitting: Internal Medicine

## 2009-11-25 ENCOUNTER — Ambulatory Visit: Payer: Self-pay | Admitting: Internal Medicine

## 2009-11-25 IMAGING — CR DG CHEST 2V
2 series · 2 of 2 positions shown · non-contrast
Comparison: Chest x-ray of [DATE]

CLINICAL DATA: Cough, hypertension

CHEST - 2 VIEW

[view not recorded (1 of 2)]
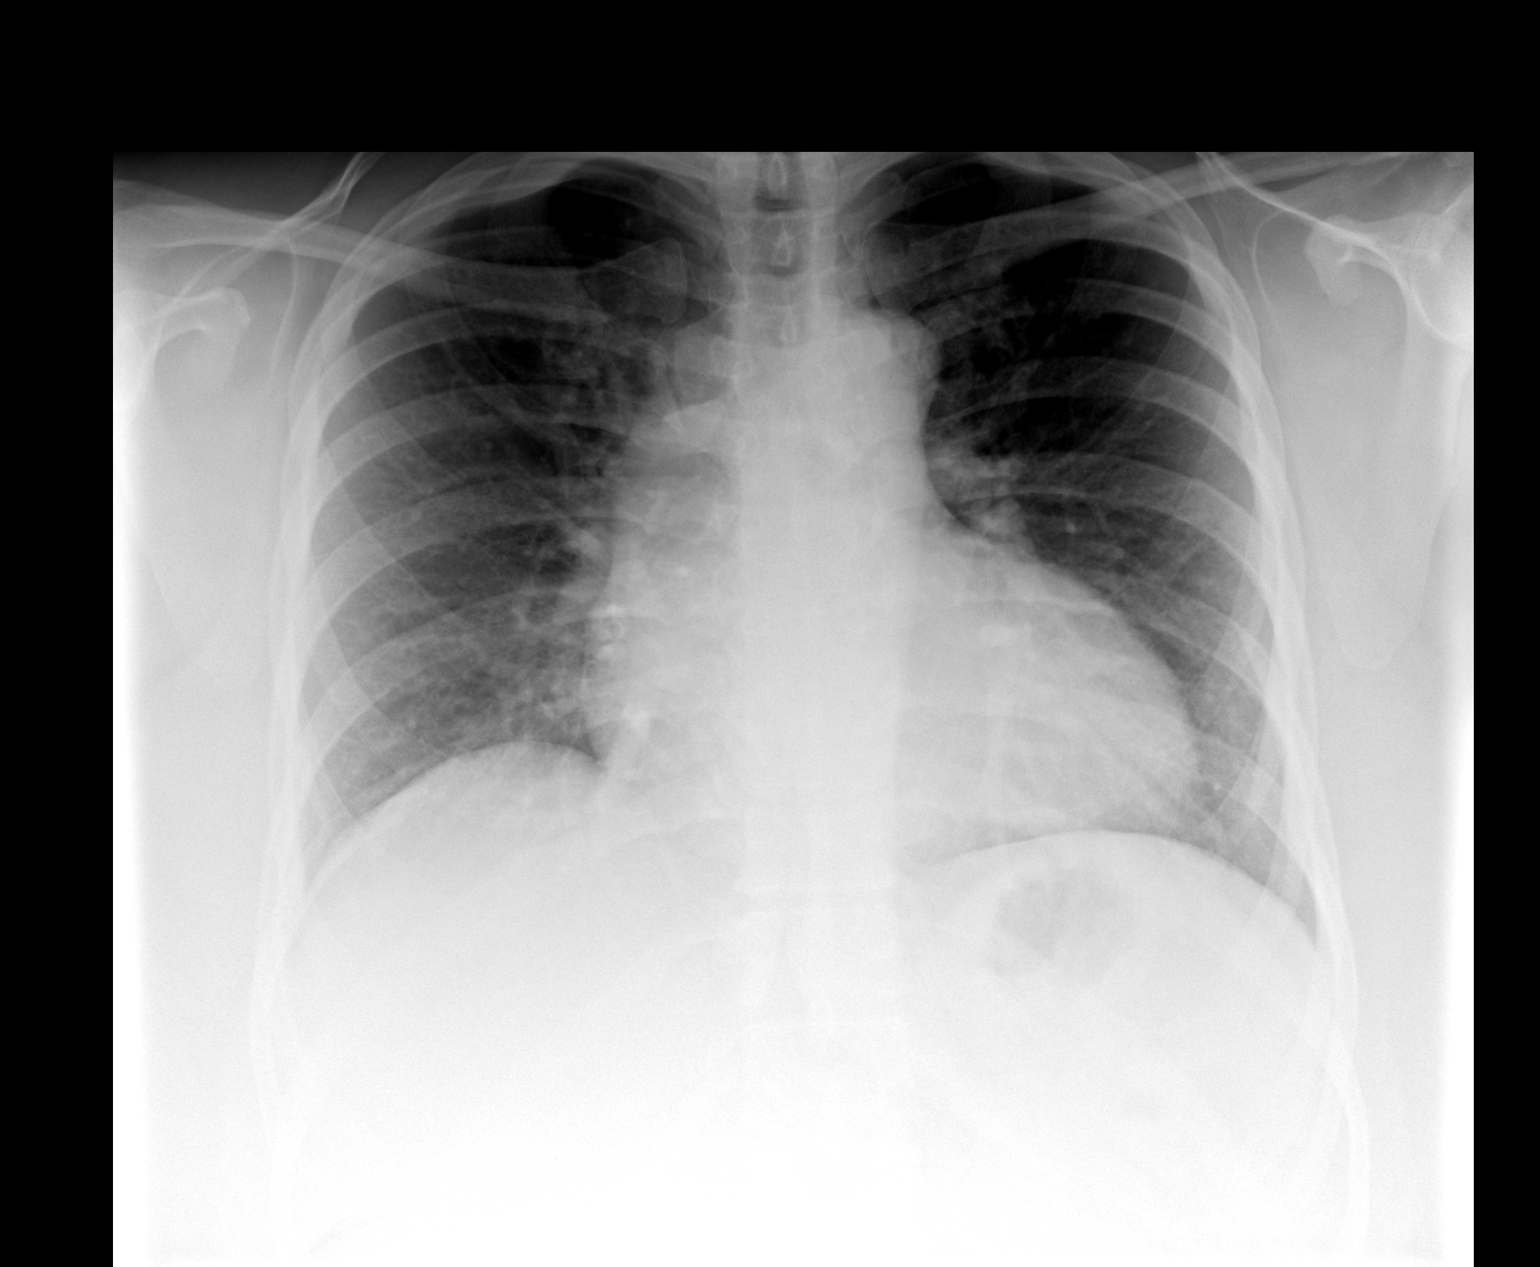

[view not recorded (2 of 2)]
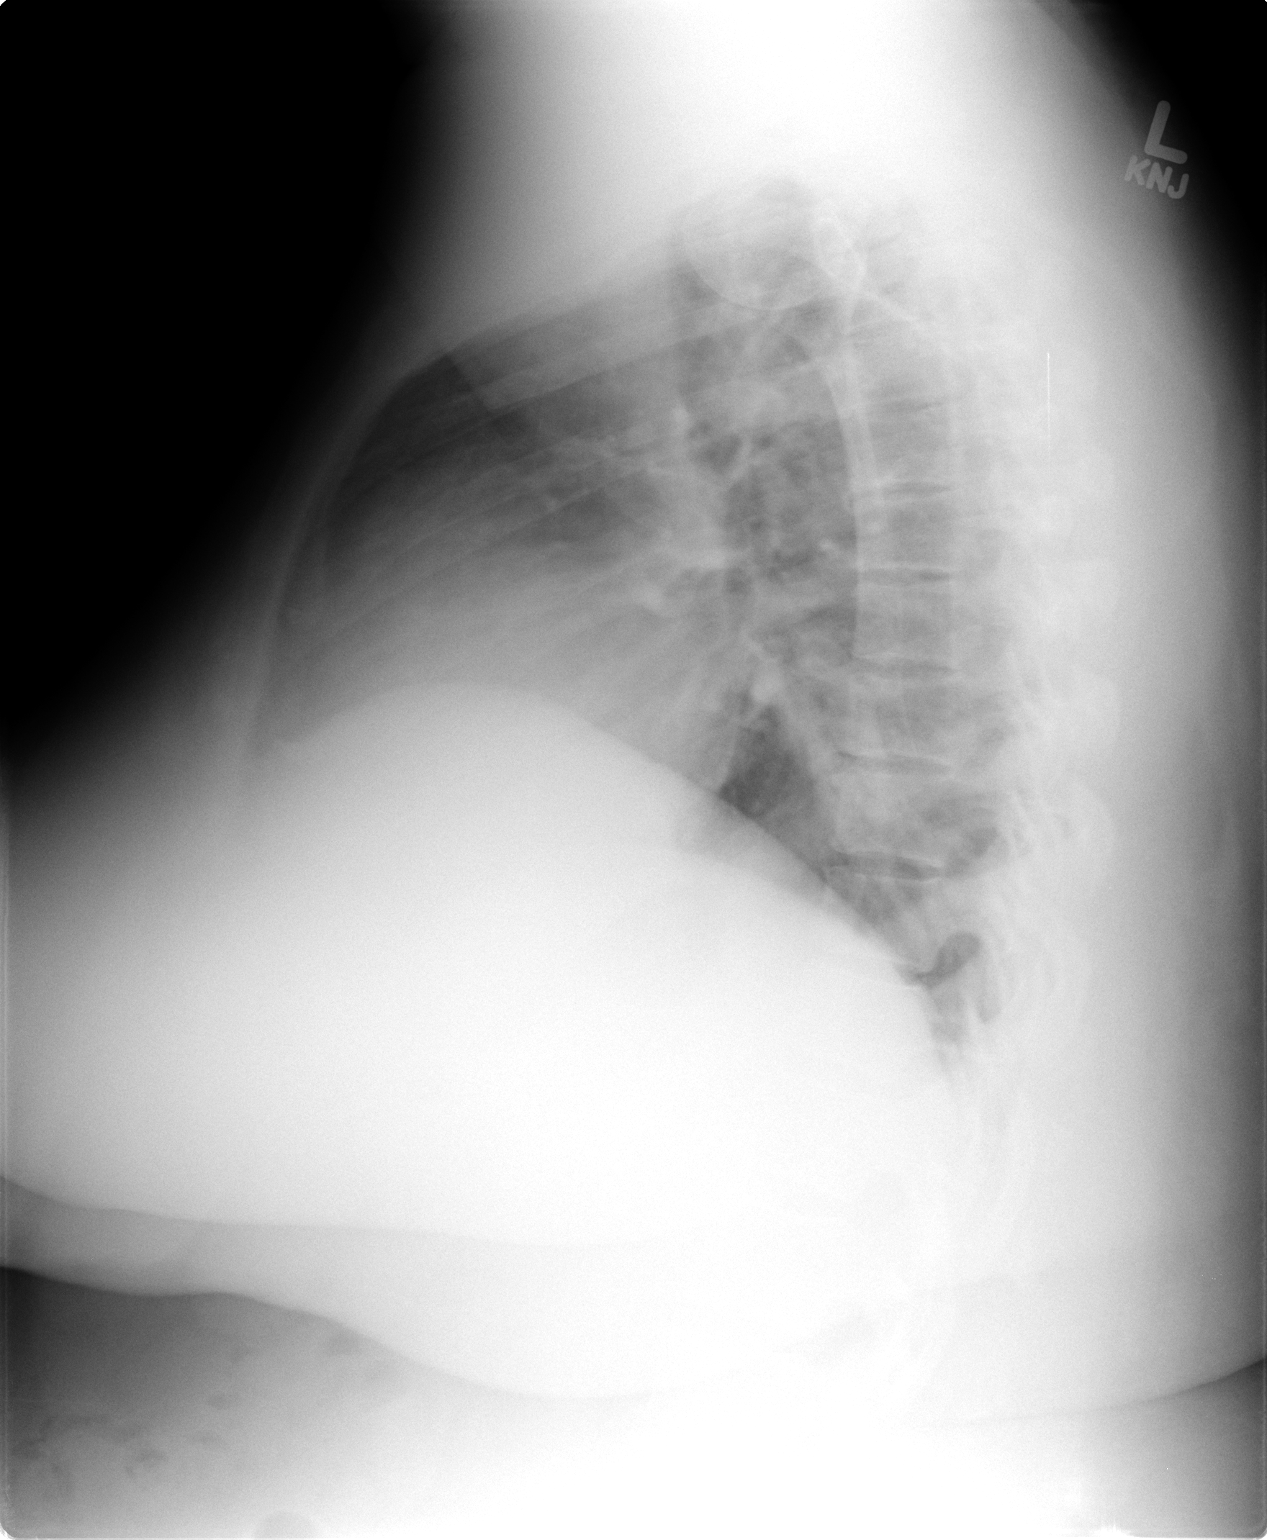

[2 of 2 positions shown; findings below may reference images not displayed]

FINDINGS: The lungs are clear.  Cardiomegaly is stable.  No bony
abnormality is seen.
IMPRESSION: Stable mild cardiomegaly.  No active lung disease.

## 2009-11-27 ENCOUNTER — Telehealth (INDEPENDENT_AMBULATORY_CARE_PROVIDER_SITE_OTHER): Payer: Self-pay | Admitting: *Deleted

## 2009-12-09 ENCOUNTER — Ambulatory Visit: Payer: Self-pay | Admitting: Internal Medicine

## 2009-12-17 ENCOUNTER — Telehealth (INDEPENDENT_AMBULATORY_CARE_PROVIDER_SITE_OTHER): Payer: Self-pay | Admitting: *Deleted

## 2009-12-31 ENCOUNTER — Telehealth: Payer: Self-pay | Admitting: Internal Medicine

## 2010-01-08 ENCOUNTER — Telehealth (INDEPENDENT_AMBULATORY_CARE_PROVIDER_SITE_OTHER): Payer: Self-pay | Admitting: *Deleted

## 2010-01-29 ENCOUNTER — Telehealth: Payer: Self-pay | Admitting: Internal Medicine

## 2010-04-07 ENCOUNTER — Ambulatory Visit: Payer: Self-pay | Admitting: Family Medicine

## 2010-04-07 ENCOUNTER — Other Ambulatory Visit: Admission: RE | Admit: 2010-04-07 | Discharge: 2010-04-07 | Payer: Self-pay | Admitting: Family Medicine

## 2010-04-07 ENCOUNTER — Encounter: Payer: Self-pay | Admitting: Family Medicine

## 2010-04-07 LAB — CONVERTED CEMR LAB
ALT: 11 units/L (ref 0–35)
AST: 16 units/L (ref 0–37)
Albumin: 3.8 g/dL (ref 3.5–5.2)
Alkaline Phosphatase: 90 units/L (ref 39–117)
Chlamydia, DNA Probe: NEGATIVE
GC Probe Amp, Genital: NEGATIVE
Potassium: 4.2 meq/L (ref 3.5–5.3)
Sodium: 139 meq/L (ref 135–145)
Total Bilirubin: 0.5 mg/dL (ref 0.3–1.2)
Total Protein: 7.2 g/dL (ref 6.0–8.3)
Whiff Test: POSITIVE

## 2010-04-08 ENCOUNTER — Encounter: Payer: Self-pay | Admitting: Family Medicine

## 2010-06-05 ENCOUNTER — Encounter: Payer: Self-pay | Admitting: Family Medicine

## 2010-08-15 ENCOUNTER — Ambulatory Visit
Admission: RE | Admit: 2010-08-15 | Discharge: 2010-08-15 | Payer: Self-pay | Source: Home / Self Care | Attending: Family Medicine | Admitting: Family Medicine

## 2010-08-15 DIAGNOSIS — R110 Nausea: Secondary | ICD-10-CM | POA: Insufficient documentation

## 2010-08-15 DIAGNOSIS — M549 Dorsalgia, unspecified: Secondary | ICD-10-CM | POA: Insufficient documentation

## 2010-08-15 LAB — CONVERTED CEMR LAB
Bilirubin Urine: NEGATIVE
Urobilinogen, UA: 0.2

## 2010-08-26 NOTE — Assessment & Plan Note (Signed)
Summary: headache/[place]/[facility]axon   Vital Signs:  Patient profile:   40 year old female Height:      64 inches Weight:      312.5 pounds BMI:     53.83 Temp:     98.1 degrees F oral Pulse rate:   76 / minute BP sitting:   156 / 98  (right arm) Cuff size:   large  Vitals Entered By: Garen Grams LPN (September 12, 2009 9:31 AM)  Serial Vital Signs/Assessments:  Comments: 10:06 AM 142/90 Manual BP By: Jone Baseman CMA   CC: migraine x 5 days Is Patient Diabetic? No Pain Assessment Patient in pain? yes     Location: migraine   Primary Care Provider:  Luretha Murphy NP  CC:  migraine x 5 days.  History of Present Illness: Danielle Shaw here for work-in appt for headache x 5 days.  Has headaches every few months and has a "bad one" about once a year.  This episode has been going on for 5 days, features consistant with previous headaches.  She feels pressure in a band-like distribution around her entire head.  Notes some associated nasuea and photophobia.  has tried some excedrine migraine with little releif.  Feels like it is due to increased stress, getting very ltitle sleep ngihtly only 3-4 hours.  Has breast biopsy today.  Sinus pressure:  she associates taking antibiotic with headache.  Has discontinued medication.  Still feels pressure.  NO nasal drainage but nasal congestion, fever, SOB.  Has some continued cough and some tenderness in back and chest with coughing.  Has tried many nasal sprays with no releif.  Habits & Providers  Alcohol-Tobacco-Diet     Tobacco Status: never  Current Medications (verified): 1)  Lisinopril-Hydrochlorothiazide 10-12.5 Mg Tabs (Lisinopril-Hydrochlorothiazide) .... One Daily 2)  Crestor 40 Mg Tabs (Rosuvastatin Calcium) .... One Tab At Bedtime (Dosage Change) 3)  Aspirin 81 Mg  Tabs (Aspirin) .[name].. 1 By Mouth Once Daily 4)  Ibuprofen 800 Mg Tabs (Ibuprofen) .... Three Times A Day As Needed Pain 5)  Polyethylene Glycol 3350  Powd (Polyethylene Glycol  3350) .... One Capful in 4 Ounce of Water Daily, Qs 6)  Guaifenesin Ac 100-10 Mg/39ml Syrp (Guaifenesin-Codeine) .... 2 Tespoonsful Qid As Needed Cough, 150 Cc 7)  Amoxicillin 875 Mg Tabs (Amoxicillin) .... One Two Times A Day For 7 Days 8)  Reglan 10 Mg Tabs (Metoclopramide Hcl) .... Take One Tablet Every 6 Hours As Needed For Nausea 9)  Diclofenac Potassium 50 Mg Tabs (Diclofenac Potassium) .... Take One Tablet 3 Times A Day As Needed For Headache  Allergies: No Known Drug Allergies PMH-FH-SH reviewed-no changes except otherwise noted  Review of Systems      See HPI General:  Complains of fatigue; denies chills and fever. Eyes:  Denies discharge. ENT:  Complains of nasal congestion and sinus pressure; denies difficulty swallowing, earache, and sore throat. CV:  Complains of chest pain or discomfort; denies difficulty breathing while lying down, near fainting, shortness of breath with exertion, and swelling of feet. Resp:  Complains of cough; denies shortness of breath.  Physical Exam  General:   morbildy obese AA female, wearing unglasses sitting in dark room.  tearful. Eyes:  Pupils equal round and reactive to light, conjunctivae and lids clear.  No scleral icterus.  Ears:  External ear exam shows no significant lesions or deformities.  Otoscopic examination reveals clear canals, tympanic membranes are intact bilaterally without bulging, retraction, inflammation or discharge. Hearing is grossly normal  bilaterally. Nose:  erythematous, enlarged turbiantes Mouth:  mild erythema in posterior oropharynx.  No tonsilar exudate or erythema. Neck:  No deformities, masses, or tenderness noted. Lungs:  distant sounds throughout due to body habitus.  No wheezes, rales. Heart:  normal rate and regular rhythm.   Neurologic:  No cranial nerve deficits noted. Station and gait are normal.    Impression & Recommendations:  Problem # 1:  MIGRAINE, UNSPEC., W/O INTRACTABLE MIGRAINE  (ICD-346.90) With more features of tension headache today.  Assocaition with increase in stress, worry about medical procedure.  Patient declines Toradol injection today.  WIll Treat acutely with NSAID's and antiemetics (Diclofenac and Reglan).  Advised on self-care- getting adequte sleep.  Wrote work note to be out this weekend to give time for headache to resolve.  Return to work 09-16-09.   The following medications were removed from the medication list:    Ibuprofen 800 Mg Tabs (Ibuprofen) .[name]... Three times a day as needed pain Her updated medication list for this problem includes:    Aspirin 81 Mg Tabs (Aspirin) .[name]... 1 by mouth once daily    Diclofenac Potassium 50 Mg Tabs (Diclofenac potassium) .[name]... Take one tablet 3 times a day as needed for headache  Orders: [facility]- Est Level  3 (30865)  Problem # 2:  SINUSITIS, CHRONIC (ICD-473.9)  No significant change in symptoms.  I do not think this is the cause of her headache nor did she had a drug reaction.  Will treat headache today and asked patient to follow-up with PCP [facility].   The following medications were removed from the medication list:    Amoxicillin 875 Mg Tabs (Amoxicillin) ..... One two times a day for 7 days Her updated medication list for this problem includes:    Guaifenesin Ac 100-10 Mg/70ml Syrp (Guaifenesin-codeine) .[name]... 2 tespoonsful qid as needed cough, 150 cc  Orders: [facility]- Est Level  3 (78469)  Problem # 3:  HYPERTENSION, BENIGN SYSTEMIC (ICD-401.1)  Elevated today although close to goal on recheck of 142/90.  Will monitor.  Her updated medication list for this problem includes:    Lisinopril-hydrochlorothiazide 10-12.5 Mg Tabs (Lisinopril-hydrochlorothiazide) ..... One daily  Orders: [facility]- Est Level  3 (62952)  Complete Medication List: 1)  Lisinopril-hydrochlorothiazide 10-12.5 Mg Tabs (Lisinopril-hydrochlorothiazide) .... One daily 2)  Crestor 40 Mg Tabs (Rosuvastatin calcium) .... One tab at bedtime (dosage  change) 3)  Aspirin 81 Mg Tabs (Aspirin) .[name].. 1 by mouth once daily 4)  Polyethylene Glycol 3350 Powd (Polyethylene glycol 3350) .... One capful in 4 ounce of water daily, qs 5)  Guaifenesin Ac 100-10 Mg/57ml Syrp (Guaifenesin-codeine) .... 2 tespoonsful qid as needed cough, 150 cc 6)  Reglan 10 Mg Tabs (Metoclopramide hcl) .... Take one tablet every 6 hours as needed for nausea 7)  Diclofenac Potassium 50 Mg Tabs (Diclofenac potassium) .... Take one tablet 3 times a day as needed for headache  Patient Instructions: 1)  Prescribed new medicines or reglan (for nausea- may make you sleepy) and diclofenac (pain).  Do not take ibuprofen, aleve, or other nsaids at the same time as diclofenac. 2)  Return for visit with Luretha Murphy. Prescriptions: DICLOFENAC POTASSIUM 50 MG TABS (DICLOFENAC POTASSIUM) take one tablet 3 times a day as needed for headache  #45 x 0   Entered and Authorized by:   Delbert Harness MD   Signed by:   Delbert Harness MD on 09/12/2009   Method used:   Electronically to  [facility]  [address] [place] [phone]* (retail)       [phone], [place]  [address]       Ph: [phone] or [phone]       Fax: [phone]   RxID:   [id number] REGLAN 10 MG TABS (METOCLOPRAMIDE HCL) take one tablet every 6 hours as needed for nausea  #30 x 0   Entered and Authorized by:   Delbert Harness MD   Signed by:   Delbert Harness MD on 09/12/2009   Method used:   Electronically to        [facility]. [phone]* (retail)       [address]       [place], [place]  [address]       Ph: [phone] or [phone]       Fax: [phone]   RxID:   [id number]

## 2010-08-26 NOTE — Assessment & Plan Note (Signed)
Summary: Pulmonary/ new pt eval for cough  ? how much is ace ?   Primary Provider/Referring Provider:  Luretha Murphy NP  CC:  Pulmonary consult for cough x3 weeks. 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  Pt denies any significant sore throat, dysphagia, itching, sneezing,  fever, chills, sweats, unintended wt loss, pleuritic or exertional cp, hempoptysis, change in activity tolerance  orthopnea pnd or leg swelling. Pt also denies any obvious fluctuation in symptoms with weather or environmental change or other alleviating or aggravating factors.       Preventive Screening-Counseling & Management  Alcohol-Tobacco     Alcohol drinks/day: 0     Smoking Status: never  Current Medications (verified): 1)  Lisinopril-Hydrochlorothiazide 10-12.5 Mg Tabs (Lisinopril-Hydrochlorothiazide) .... One Daily 2)  Aspirin 81 Mg  Tabs (Aspirin) .[name].. 1 By Mouth Once Daily 3)  Guaifenesin Ac 100-10 Mg/58ml Syrp (Guaifenesin-Codeine) .... 2 Tespoonsful Qid As Needed Cough, 150 Cc 4)  Crestor 40 Mg Tabs (Rosuvastatin Calcium) .[name].. 1 By Mouth Daily  Allergies (verified): 1)  ! * Latex  Past History:  Past Medical History: Adosine myoview-low risk for ischemia 12/2008 Breast cyst aspirated, infectious, treated with ATB, follow up by surgery ([facility])   CHEST PAIN (ICD-786.50) MORBID OBESITY (ICD-278.01) HYPERTENSION, BENIGN SYSTEMIC (ICD-401.1)    - Off ACE October 22, 2009 due to cough HYPERLIPIDEMIA (ICD-272.4) DEPRESSION (ICD-311) MIGRAINE, UNSPEC., W/O INTRACTABLE MIGRAINE (ICD-346.90) SINUSITIS, CHRONIC (ICD-473.9) INTRAUTERINE CONTRACEPTIVE DEVICE  (ICD-V25.1) IMMUNIZATION DELAY (ICD-V15.9)    Family History: Reviewed history from 11/01/2008 and no changes required. Mom--died at age 91 of acute MI; CAD s/p CABG x 2, HTN, 2 daughters--allergic rhinitis, eczema, obesity, Aunt--HTN, CAD, Brother--`irregular heartbeat`,  Dad--living, healthy Mat uncle--died at age 82 of acute MI; thought to be healthy,  Paternal aunt--MI <50 yrs of age,  PGM--died of an unknown cancer,  Sister--healthy  Social History: Reviewed history from 11/06/2008 and no changes required. Single; lives with her 2 daughters (Sey[place]es Lyons born in 1993 & Deysi Soldo born in 2004); Denies any h/o tobacco, alcohol, illicit drugs;  Attended college for 2 years, recently completed CMA, working at [employer] in evening.Alcohol drinks/day:  0  Review of Systems       The patient complains of productive cough, headaches, and nasal congestion/difficulty breathing through nose.  The patient denies shortness of breath with activity, shortness of breath at rest, non-productive cough, coughing up blood, chest pain, irregular heartbeats, acid heartburn, indigestion, loss of appetite, weight change, abdominal pain, difficulty swallowing, sore throat, tooth/dental problems, sneezing, itching, ear ache, anxiety, depression, hand/feet swelling, joint stiffness or pain, rash, change in color of mucus, and fever.    Vital Signs:  Patient profile:   40 year old female Height:      65 inches (165.10 cm) Weight:      322 pounds (146.36 kg) BMI:     53.78 O2 Sat:      93 % on Room air Temp:     98.0 degrees F (36.67 degrees C) oral Pulse rate:   75 / minute BP sitting:   144 / 100  (  left arm) Cuff size:   large  Vitals Entered By: Michel Bickers CMA (October 22, 2009 10:48 AM)  O2 Sat at Rest %:  93 O2 Flow:  Room air CC: Pulmonary consult for cough x3 weeks. The pt c/o cough with green mucus and sinus trouble.   Physical Exam  Additional Exam:  amb obese bf nad wt 312 > 322  October 22, 2009  with classic pseudowheeze resolves with purse lip maneuver  HEENT: nl dentition, turbinates, and orophanx. Nl external ear canals without cough reflex NECK :  without JVD/Nodes/TM/ nl carotid upstrokes bilaterally LUNGS: no acc muscle use, clear to A and P bilaterally without cough on insp or exp maneuvers CV:  RRR  no s3 or murmur or increase in P2, no edema  ABD:  soft and nontender with nl excursion in the supine position. No bruits or organomegaly, bowel sounds nl MS:  warm without deformities, calf tenderness, cyanosis or clubbing SKIN: warm and dry without lesions   NEURO:  alert, approp, no deficits     CXR  Procedure date:  11/01/2008  Findings:         Findings: Cardiomegaly is present, which is made more pronounced by   low lung volumes.  Mild prominence of the central markings without   lobar consolidation or overt failure    I see no effusion or   pneumothorax.  Osseous structures grossly unremarkable.    IMPRESSION:   Cardiomegaly.  Question mild vascular congestion.  Impression & Recommendations:  Problem # 1:  COUGH (ICD-786.2)  The most common causes of chronic cough in immunocompetent adults include: upper airway cough syndrome (UACS), previously referred to as postnasal drip syndrome,  caused by variety of rhinosinus conditions; (2) asthma; (3) GERD; (4) chronic bronchitis from cigarette smoking or other inhaled environmental irritants; (5) nonasthmatic eosinophilic bronchitis; and (6) bronchiectasis. These conditions, singly or in combination, have accounted for up to 94% of the causes of chronic cough in prospective studies.  This is classic Upper airway cough syndrome, so named because it's frequently impossible to sort out how much is LPR vs CR/sinusitis with freq throat clearing generating secondary extra esophageal GERD from wide swings in gastric pressure that occur with throat clearing, promoting self use of mint and menthol lozenges that  reduce the lower esophageal sphincter tone and exacerbate the problem further.  These symptoms are easily confused with asthma/copd by even experienced pulmonogists because they overlap so much. These are the same pts who not infrequently have failed to tolerate ace inhibitors,  dry powder inhalers or biphosphonates or report having reflux symptoms that don't respond to standard doses of PPI   For now try off ace and short course PPI then back here to regroup with next step sinus ct if no better. she has already had adequate empiric abx trial at this point.  Orders: New Patient Level V ([address])  Problem # 2:  HYPERTENSION, BENIGN SYSTEMIC (ICD-401.1)  The following medications were removed from the medication list:    Lisinopril-hydrochlorothiazide 10-12.5 Mg Tabs (Lisinopril-hydrochlorothiazide) ..... One daily Her updated medication list for this problem includes:    Diovan Hct 160-12.5 Mg Tabs (Valsartan-hydrochlorothiazide) ..... One by mouth daily   ACE inhibitors are problematic in  pts with airway complaints because  even experienced pulmonologists can't always distinguish ace effects from copd/asthma.  By themselves they don't actually cause a problem, much like oxygen can't by itself start a fire, but they certainly serve as a powerful catalyst or enhancer  for any "fire"  or inflammatory process in the upper airway, be it caused by an ET  tube or more commonly reflux (especially in the obese or pts with known GERD or who are on biphoshonates).  In the era of ARB near equivalency until we have a better handle on the reversibility of the airway problem, it just makes sense to avoid ace entirely in the short run and then decide later, having established a level of airway control using a reasonable limited regimen, whether to add back ace but even then being very careful to observe the pt for worsening airway control and number of meds used/ needed to control symptoms.    Orders: New Patient  Level V ([address])  Problem # 3:  ABNORMAL LUNG XRAY (ICD-793.1)  Baseline cxr compared to the one she brought in and the new one just appears to be @ lower lung vol so simply need to repeat on return at full insp and compare apples to apples.  doubt she has sarcoid or ILD but need to excude this on f/u.  Orders: New Patient Level V ([address])  Medications Added to Medication List This Visit: 1)  Crestor 40 Mg Tabs (Rosuvastatin calcium) .[name].. 1 by mouth daily 2)  Diovan Hct 160-12.5 Mg Tabs (Valsartan-hydrochlorothiazide) .... One by mouth daily 3)  Dexilant 60 Mg Cpdr (Dexlansoprazole) .... Take  one 30-60 min before first meal of the day 4)  Prednisone 10 Mg Tabs (Prednisone) .... 4 each am x 2days, 2x2days, 1x2days and stop 5)  Tramadol Hcl 50 Mg Tabs (Tramadol hcl) .... One to two by mouth every 4-6 hours for cough or pain  Patient Instructions: 1)  stop lisinopril 2)  start Diovan 160/12.5 one daily in place of lisnopril 3)  Dexilant 60 mg Take  one 30-60 min before first meal of the day x 2 weeks of samples 4)  GERD (REFLUX)  is a common cause of respiratory symptoms. It commonly presents without heartburn and can be treated with medication, but also with lifestyle changes including avoidance of late meals, excessive alcohol, smoking cessation, and avoid fatty foods, chocolate, peppermint, colas, red wine, and acidic juices such as orange juice. NO MINT OR MENTHOL PRODUCTS SO NO COUGH DROPS  5)  USE SUGARLESS CANDY INSTEAD (jolley ranchers)  6)  NO OIL BASED VITAMINS  7)  Take delsym two tsp every 12 hours and add tramadol 50 mg up to 2 every 4 hours to suppress the urge to cough. Swallowing water or using ice chips/non mint and menthol containing candies (such as lifesavers or sugarless jolly ranchers) are also effective.  8)  Prednisone 10 mg 4 each am x 2days, 2x2days, 1x2days and stop 9)  Please schedule a follow-up appointment in 4 weeks, sooner if needed with CXR on return  10)  SINUS  CT NEXT STEP IF COUGH NOT BETTER Prescriptions: TRAMADOL HCL 50 MG  TABS (TRAMADOL HCL) One to two by mouth every 4-6 hours for cough or pain  #40 x 0   Entered and Authorized by:   Nyoka Cowden MD   Signed by:   Nyoka Cowden MD on 10/22/2009   Method used:   [facility] to        [facility] [phone]* (retail)       [address]       [place], [place]  [address]       Ph: [phone] or [phone]       Fax:  [phone]   RxID:   [id number] PREDNISONE 10 MG  TABS (PREDNISONE) 4 each am x 2days, 2x2days, 1x2days and stop  #14 x 0   Entered and Authorized by:   Nyoka Cowden MD   Signed by:   Nyoka Cowden MD on 10/22/2009   Method used:   [facility]ron[facility] to        [facility] [phone]* (retail)       [address]       [place], [place]  [address]       Ph: [phone] or [phone]       Fax: [phone]   RxID:   [id number]

## 2010-08-26 NOTE — Miscellaneous (Signed)
  Clinical Lists Changes  Medications: Removed medication of REGLAN 10 MG TABS (METOCLOPRAMIDE HCL) take one tablet every 6 hours as needed for nausea Added new medication of CIPROFLOXACIN HCL 500 MG TABS (CIPROFLOXACIN HCL) one two times a day for 10 days - Signed Rx of CIPROFLOXACIN HCL 500 MG TABS (CIPROFLOXACIN HCL) one two times a day for 10 days;  #20 x 0;  Signed;  Entered by: Luretha Murphy NP;  Authorized by: Luretha Murphy NP;  Method used: [facility] [phone]*, [address] [place], [place]  [address], Ph: [phone] or [phone], Fax: [phone]    Prescriptions: CIPROFLOXACIN HCL 500 MG TABS (CIPROFLOXACIN HCL) one two times a day for 10 days  #20 x 0   Entered and Authorized by:   Luretha Murphy NP   Signed by:   Luretha Murphy NP on 09/16/2009   Method used:   Electronically to        [facility] [phone]* (retail)       [address]       [place], [place]  [address]       Ph: [phone] or [phone]       Fax: [phone]   RxID:   [id number]

## 2010-08-26 NOTE — Assessment & Plan Note (Signed)
Summary: cpe,tcb   Vital Signs:  Patient profile:   40 year old female Weight:      308.5 pounds Temp:     98.1 degrees F oral Pulse rate:   71 / minute Pulse rhythm:   regular BP sitting:   156 / 107 Cuff size:   large  Vitals Entered By: Loralee Pacas CMA (April 07, 2010 8:40 AM) CC: cpe   Primary Care Danielle Shaw:  Danielle Murphy NP  CC:  cpe.  History of Present Illness: Danielle Shaw is here for annual CPE.  She has been seeing Dr. Sherene Shaw for her chronic cough.  She was taken off her ACE and replaced with an ARB/thiazide combo.  She has stopped taking for 2 reasons, she is unable to afford as Medicaid will not cover; and she believes it is too strong.  We discussed that her HTN is silent and significant, she has + FH in both parents with Mother having early sudden death.  Her father recently had stent #5 and now in line for CABG surgery.  Her cough is improved but she still complains of phlem and pain in her chest when she coughs.  Tramadol is effective in treating this.  We discussed GERD in the context of her morbid obesity.  She rarely has BMs.  Her diet is high in fat and process foods.  She has been counseled on nutrition on many occasions and has made some small changes.  She is frustrated with her 54 year old daughter who is living on the "streets".  She is working part time and trying to focus on her 38 years old, who she reports as getting heavy.  Habits & Providers  Alcohol-Tobacco-Diet     Alcohol drinks/day: 0     Tobacco Status: never     Diet Comments: only eating one meal per day     Diet Counseling: three well balanced meals  Current Medications (verified): 1)  Aspirin 81 Mg  Tabs (Aspirin) .[name].. 1 By Mouth Once Daily 2)  Simvastatin 80 Mg  Tabs (Simvastatin) .... One Tablet Daily 3)  Tramadol Hcl 50 Mg  Tabs (Tramadol Hcl) .... One To Two By Mouth Every 4-6 Hours For Cough or Pain 4)  Polyethylene Glycol 3350  Powd (Polyethylene Glycol 3350) .Marland KitchenMarland KitchenMarland Kitchen 17 Gm in 4 Oz Water  Two Times A Day For One Week Then Daily, Qs 5)  Omeprazole 20 Mg Cpdr (Omeprazole) .... Take One Daily 6)  Famotidine 40 Mg Tabs (Famotidine) .... One Qhs 7)  Amlodipine Besylate 5 Mg Tabs (Amlodipine Besylate) .... One Daily  Allergies (verified): 1)  ! * Latex  Review of Systems General:  Complains of fatigue. ENT:  Complains of postnasal drainage; denies earache, nasal congestion, and sore throat. CV:  Complains of swelling of feet; denies chest pain or discomfort. Resp:  Complains of cough and sputum productive. GI:  Complains of constipation. GU:  Denies discharge and dysuria.  Physical Exam  General:  Alert, morbilidly obese Ears:  R ear normal and L ear normal.   Nose:  no nasal discharge, no mucosal pallor, and no mucosal edema.   Mouth:  pharynx pink and moist and fair dentition.   Neck:  No deformities, masses, or tenderness noted. Breasts:  past reduction surgery, no nipples, fatty breasts Lungs:  normal respiratory effort and normal breath sounds.   Heart:  normal rate, regular rhythm, and no murmur.   Abdomen:  Very large, + bs Rectal:  no external abnormalities and no hemorrhoids.  Genitalia:  normal introitus, no external lesions, no vaginal discharge, mucosa pink and moist, and no vaginal or cervical lesions.  IUD string present.  Unable to appreciate exam bimanual secondary to body habitus. Msk:  normal ROM.   Pulses:  R and L carotid,radial,DP and PT normal   Impression & Recommendations:  Problem # 1:  TIREDNESS (ICD-780.79)  Orders: TSH-[facility] ([id number]) [facility] - Est  18-39 yrs ([address])  Problem # 2:  UNSPECIFIED VAGINITIS AND VULVOVAGINITIS (ICD-616.10)  The following medications were removed from the medication list:    Avelox 400 Mg Tabs (Moxifloxacin hcl) ..... By mouth daily x 5 days  Orders: GC/Chlamydia-[facility] (87591/87491) [facility]  Problem # 3:  COUGH (ICD-786.2) multifactorial, seen by pulmonary with benefit.    Problem # 4:   HYPERTENSION, BENIGN SYSTEMIC (ICD-401.1) Patient is unable to afford ARB (medicaid will not cover) and has a negative belief in it, she feels that it is too strong.  Start amlodipine at 5, recheck in one month and increase to 10 if tolerating.  Discussed 10 year risk with uncontrolled BP, significant FH. The following medications were removed from the medication list:    Diovan Hct 160-25 Mg Tabs (Valsartan-hydrochlorothiazide) ..... One by mouth daily Her updated medication list for this problem includes:    Amlodipine Besylate 5 Mg Tabs (Amlodipine besylate) ..... One daily  Orders: Comp Met-[facility] [phone]) [facility] - Est  18-39 yrs ([address])  Problem # 5:  CONSTIPATION, CHRONIC (ICD-564.09) Patient is not moving bowels much at all, add PEG Her updated medication list for this problem includes:    Polyethylene Glycol 3350 Powd (Polyethylene glycol 3350) .Marland KitchenMarland KitchenMarland KitchenMarland Kitchen 17 gm in 4 oz water two times a day for one week then daily, qs  Problem # 6:  SCREENING FOR MALIGNANT NEOPLASM OF THE CERVIX (ICD-V76.2)  Orders: Pap Smear-[facility] ([id number]) [facility] - Est  18-39 yrs ([address]75)  Problem # 7:  HYPERLIPIDEMIA (ICD-272.4) Had to take off Crestor as Medicaid would no longer cover, check LDL on simvastatin, no evidence of myopathy Her updated medication list for this problem includes:    Simvastatin 80 Mg Tabs (Simvastatin) ..... One tablet daily  Orders: Direct LDL-[facility] [phone])  Problem # 8:  CONTACT OR EXPOSURE TO OTHER VIRAL DISEASES (ICD-V01.79)  Orders: HIV-[facility] ([id number])  Complete Medication List: 1)  Aspirin 81 Mg Tabs (Aspirin) .[name].. 1 by mouth once daily 2)  Simvastatin 80 Mg Tabs (Simvastatin) .... One tablet daily 3)  Tramadol Hcl 50 Mg Tabs (Tramadol hcl) .... One to two by mouth every 4-6 hours for cough or pain 4)  Polyethylene Glycol 3350 Powd (Polyethylene glycol 3350) .Marland KitchenMarland Kitchen. 17 gm in 4 oz water two times a day for one week then daily, qs 5)  Omeprazole 20 Mg Cpdr (Omeprazole) ....  Take one daily 6)  Famotidine 40 Mg Tabs (Famotidine) .... One qhs 7)  Amlodipine Besylate 5 Mg Tabs (Amlodipine besylate) .... One daily  Patient Instructions: 1)  Check BP weekly if not at 130/80 or lower consistently, call and will need to increase BP med 2)  Life style changes as discussed to promote weight loss Prescriptions: SIMVASTATIN 80 MG  TABS (SIMVASTATIN) One tablet daily  #30 x 6   Entered and Authorized by:   Danielle Murphy NP   Signed by:   Danielle Murphy NP on 04/07/2010   Method used:   Electronically to        [facility] (retail)       [address]       [place], [place]  [address]       Ph: [phone]       Fax: [phone]   RxID:   [id number] AMLODIPINE BESYLATE 5 MG TABS (AMLODIPINE BESYLATE) one daily  #30 x 1   Entered and Authorized by:   Danielle Murphy NP   Signed by:   Danielle Murphy NP on 04/07/2010   Method used:   Electronically to        [facility] (retail)       [address]       [place], [place]  [address]       Ph: [phone]       Fax: [phone]   RxID:   [id number] FAMOTIDINE 40 MG TABS (FAMOTIDINE) one qhs  #30 x 6   Entered and Authorized by:   Danielle Murphy NP   Signed by:   Danielle Murphy NP on 04/07/2010   Method used:   Electronically to        [facility] (retail)       [address]       [place], [place]  [address]       Ph: [phone]       Fax: [phone]   RxID:   [id number] OMEPRAZOLE 20 MG CPDR (OMEPRAZOLE) take one daily  #30 x 6   Entered and Authorized by:   Danielle Murphy NP   Signed by:   Danielle Murphy NP on 04/07/2010   Method used:   Electronically to        [facility] (retail)       [address]       [place], [place]  [address]       Ph: [phone]       Fax: [phone]   RxID:   [id number] TRAMADOL HCL 50 MG  TABS (TRAMADOL HCL) One to two by mouth every 4-6 hours for  cough or pain  #50 x 2   Entered and Authorized by:   Danielle Murphy NP   Signed by:   Danielle Murphy NP on 04/07/2010   Method used:   Electronically to        [facility] (retail)       [address]       [place], [place]  [address]       Ph: [phone]       Fax: [phone]   RxID:   [id number] POLYETHYLENE GLYCOL 3350  POWD (POLYETHYLENE GLYCOL 3350) 17 GM in 4 oz water two times a day for one week then daily, QS  #1 x 6   Entered and Authorized by:   Danielle Murphy NP   Signed by:   Danielle Murphy NP on 04/07/2010   Method used:   Electronically to        [facility] (retail)       [address]       [place], [place]  [address]       Ph: [phone]       Fax: [phone]   RxID:   [id number]    Prevention & Chronic Care Immunizations  Influenza vaccine: Not documented    Tetanus booster: 12/25/2005: Done.   Tetanus booster due: 12/26/2015    Pneumococcal vaccine: Not documented  Other Screening   Pap smear: NEGATIVE FOR INTRAEPITHELIAL LESIONS OR MALIGNANCY.  (04/05/2009)   Pap smear due: 03/22/2009   Smoking status: never  (04/07/2010)  Lipids   Total Cholesterol: 253  (04/05/2009)   LDL: 186  (04/05/2009)   LDL Direct: 128  (09/06/2009)   HDL: 36  (04/05/2009)   Triglycerides: 157  (04/05/2009)    SGOT (AST): 11  (04/05/2009)   SGPT (ALT): 9  (04/05/2009) CMP ordered    Alkaline phosphatase: 83  (04/05/2009)   Total bilirubin: 0.4  (04/05/2009)    Lipid flowsheet reviewed?: Yes   Progress toward LDL goal: Unchanged  Hypertension   Last Blood Pressure: 156 / 107  (04/07/2010)   Serum creatinine: 0.69  (04/05/2009)   Serum potassium 3.7  (04/05/2009) CMP ordered     Hypertension flowsheet reviewed?: Yes   Progress toward BP goal: Unchanged  Self-Management Support :   Personal Goals (by the next clinic visit) :      Personal blood pressure goal: 130/80  (04/05/2009)      Personal LDL goal: 100  (04/05/2009)    Patient will work on the following items until the next clinic visit to reach self-care goals:     Medications and monitoring: take my medicines every day, check my blood pressure, weigh myself weekly  (04/07/2010)     Eating: drink diet soda or water instead of juice or soda, eat more vegetables, use fresh or frozen vegetables, eat foods that are low in salt, eat baked foods instead of fried foods, eat fruit for snacks and desserts  (04/07/2010)     Activity: take a 30 minute walk every day, take the stairs instead of the elevator, park at the far end of the parking lot, join a walking program  (04/07/2010)    Hypertension self-management support: Written self-care plan  (04/07/2010)   Hypertension self-care plan printed.    Lipid self-management support: Not documented     Self-management comments: It is important to begin changes to prevent heart disease    Laboratory Results  Date/Time Received: April 07, 2010 9:36 AM  Date/Time Reported: April 07, 2010 9:41 AM   [employer] Source: vag WBC/hpf: 1-5 Bacteria/hpf: 3+  Cocci Clue cells/hpf: many  Positive whiff Yeast/hpf: none Trichomonas/hpf: none Comments: ...............test performed by......Marland KitchenBonnie A. Swaz[place], MLS (ASCP)cm

## 2010-08-26 NOTE — Progress Notes (Signed)
Summary: hoarseness/ phlegm  Phone Note Call from Patient Call back at Home Phone [phone]   Caller: Patient Call For: wert Summary of Call: pt was seen recently. she was told to call if her condition worsened. today pt c/o hoarsness in throat w/ thick green mucus. no fever. [facility] on [address].  Initial call taken by: Tivis Ringer, CNA,  Dec 17, 2009 4:55 PM  Follow-up for Phone Call        Spoke with pt and sched her appt with PW for tommorrow afternoon at 2:30 pm.  Advised ER sooner if worsens. Follow-up by: Vernie Murders,  Dec 17, 2009 5:03 PM

## 2010-08-26 NOTE — Progress Notes (Signed)
Summary: unable to afford meds  Phone Note Call from Patient   Caller: Patient Call For: wert Summary of Call: pt states that her medicaid would not pay for "any" of her meds (from her ov 5/2). please call pt at 4:00pm today at [phone] Initial call taken by: Tivis Ringer, CNA,  Nov 27, 2009 3:33 PM  Follow-up for Phone Call        Spoke with pt.  She states that when she went to pharm to get her meds everything together was 200 $ and so she did not get anything.  She already has diovan b/c you gave samples.  Please advise, thanks  Follow-up by: Vernie Murders,  Nov 27, 2009 4:12 PM  Additional Follow-up for Phone Call Additional follow up Details #1::        we have none of the meds but diovan in samples. tramadol and prednisone should be cheap generics she should go ahead and get and then do sinus ct if not better (unless sinus ct is positive probably doesn't need abx) Additional Follow-up by: Nyoka Cowden MD,  Nov 27, 2009 4:35 PM    Additional Follow-up for Phone Call Additional follow up Details #2::    called spoke with patient, advised of MW's recs as stated above.  advised pt to finished the prednisone and tramadol and if no better to call us back to schedule the CT.  pt verbalized her understanding.  pt to call if/when she needs more samples of diovan. Follow-up by: Boone Master CNA,  Nov 27, 2009 4:47 PM

## 2010-08-26 NOTE — Progress Notes (Signed)
Summary: samples given, defer swelling to PCP  Phone Note Call from Patient Call back at Home Phone [phone]   Caller: Patient Call For: wert Reason for Call: Talk to Nurse Summary of Call: Diovan, Dexilant 60mg , Simvastatin, Can she get samples?  Also pt is experiencing swelling in ankles and calves and feet.  Swelling went down some overnight but still noticeably swollen and tight. Initial call taken by: Eugene Gavia,  January 08, 2010 10:57 AM  Follow-up for Phone Call        We don't have any samples of diovan 160/25 at this time.  3 packets of dexilant left at front desk.  Pt seeing MW for cough...should probably call PCP in regards to swelling and samples of simvastatin.  LMOMTCB to inform pt of this.  Gweneth Dimitri RN  January 08, 2010 11:59 AM   Additional Follow-up for Phone Call Additional follow up Details #1::        pt returned call. i advised pt of crystal's msg- pt will pick up samples and call pcp re: swelling and simvastatin samples. nothing further needed per pt. Tivis Ringer, CNA  January 08, 2010 12:38 PM

## 2010-08-26 NOTE — Letter (Signed)
Summary: Generic Letter  [facility]  [address]   [place], [place] [address]   Phone: [phone]  Fax: [phone]    09/10/2009  Danielle Shaw [address] [place], [place]  [address]  Dear [name],  Your cholesterol is still very high, even though you are on a strong medicine, Crestor.  I will increaase the dose from 20 mg to 40 mg.  This has been sent to your pharmacy. Please come to the office in 6 weeks to check your blood, the order is entered in the system and you just need to sign in for labs only and they will call you.  It is very important that you begin a weight loss program.  We have talked about this a lot.  You must eliminate your intake of high fat animal meat, concentrated sugars, and begin to exercise.  I worry that if you do not make these changes  you will be facing the high risk of stroke, heart attack or cancer.       Sincerely,   Luretha Murphy NP

## 2010-08-26 NOTE — Assessment & Plan Note (Signed)
Summary: F/UP,TCB   Vital Signs:  Patient profile:   40 year old female Height:      64 inches Weight:      313 pounds BMI:     53.92 Temp:     98.1 degrees F oral Pulse rate:   72 / minute BP sitting:   136 / 87  (left arm) Cuff size:   large  Vitals Entered By: Tessie Fass CMA, (September 06, 2009 9:32 AM) CC: F/U BP and cholesterol Is Patient Diabetic? No Pain Assessment Patient in pain? yes     Location: upper back Intensity: 7   Primary Care Provider:  Luretha Murphy NP  CC:  F/U BP and cholesterol.  History of Present Illness: Recurring sinus problems, has been coughing with excessive drainage for several weeks.  Stopped nasal steroid, did not feel it was effective.  Lump under right arm for 2 months.  Breast reduction surgery in 2000.  Not trying to lose weight, does not see it as a problem.  Habits & Providers  Alcohol-Tobacco-Diet     Tobacco Status: never  Current Medications (verified): 1)  Lisinopril-Hydrochlorothiazide 10-12.5 Mg Tabs (Lisinopril-Hydrochlorothiazide) .... One Daily 2)  Crestor 20 Mg  Tabs (Rosuvastatin Calcium) .... One Daily (New Script, Had Been On Lipitor Will Change To Crestor) 3)  Aspirin 81 Mg  Tabs (Aspirin) .[name].. 1 By Mouth Once Daily 4)  Ibuprofen 800 Mg Tabs (Ibuprofen) .... Three Times A Day As Needed Pain 5)  Polyethylene Glycol 3350  Powd (Polyethylene Glycol 3350) .... One Capful in 4 Ounce of Water Daily, Qs 6)  Guaifenesin Ac 100-10 Mg/73ml Syrp (Guaifenesin-Codeine) .... 2 Tespoonsful Qid As Needed Cough, 150 Cc 7)  Amoxicillin 875 Mg Tabs (Amoxicillin) .... One Two Times A Day For 7 Days  Allergies: No Known Drug Allergies  Review of Systems General:  Denies chills and sweats. ENT:  Complains of nasal congestion, postnasal drainage, and sinus pressure. CV:  Denies chest pain or discomfort. Resp:  Complains of cough and sputum productive.  Physical Exam  General:  Well groomed, morbildy obese AA female Ears:  TM  retracted Nose:  highly inflammed turbs Mouth:  thick post nasal drainage Breasts:  multiple scaring from reductin surgery Lungs:  distant sounds throughout Heart:  normal rate and regular rhythm.   Axillary Nodes:  R axillary LN enlarged.     Impression & Recommendations:  Problem # 1:  LYMPHADENOPATHY, RIGHT AXILLA (ICD-785.6) Diagnostic exam to rull out patho The following medications were removed from the medication list:    Augmentin 875-125 Mg Tabs (Amoxicillin-pot clavulanate) .[name]..[name] Two times a day for 7 days Her updated medication list for this problem includes:    Amoxicillin 875 Mg Tabs (Amoxicillin) ..... One two times a day for 7 days  Orders: Mammogram (Mammogram) [facility]- Est  Level 4 (44034)  Problem # 2:  SINUSITIS, ACUTE (ICD-461.9)  Frequent problems The following medications were removed from the medication list:    Augmentin 875-125 Mg Tabs (Amoxicillin-pot clavulanate) .[name]..[name] Two times a day for 7 days Her updated medication list for this problem includes:    Guaifenesin Ac 100-10 Mg/45ml Syrp (Guaifenesin-codeine) .[name]... 2 tespoonsful qid as needed cough, 150 cc    Amoxicillin 875 Mg Tabs (Amoxicillin) ..... One two times a day for 7 days  Orders: [facility]- Est  Level 4 ([address])  Problem # 3:  MORBID OBESITY (ICD-278.01)  Does not see this as a problem  Orders: [facility]- Est  Level 4 ([address]387)  Problem #  4:  HYPERLIPIDEMIA (ICD-272.4) Trial off Crestor with increase in lipids for PA, now back on recheck. Her updated medication list for this problem includes:    Crestor 20 Mg Tabs (Rosuvastatin calcium) ..... One daily (new script, had been on lipitor will change to crestor)  Orders: Direct LDL-[facility] ([address])  Complete Medication List: 1)  Lisinopril-hydrochlorothiazide 10-12.5 Mg Tabs (Lisinopril-hydrochlorothiazide) .... One daily 2)  Crestor 20 Mg Tabs (Rosuvastatin calcium) .... One daily (new script, had been on lipitor will change to crestor) 3)  Aspirin  81 Mg Tabs (Aspirin) .[name].. 1 by mouth once daily 4)  Ibuprofen 800 Mg Tabs (Ibuprofen) .... Three times a day as needed pain 5)  Polyethylene Glycol 3350 Powd (Polyethylene glycol 3350) .... One capful in 4 ounce of water daily, qs 6)  Guaifenesin Ac 100-10 Mg/42ml Syrp (Guaifenesin-codeine) .... 2 tespoonsful qid as needed cough, 150 cc 7)  Amoxicillin 875 Mg Tabs (Amoxicillin) .... One two times a day for 7 days  Other Orders: HIV-[facility] ([id number])  Patient Instructions: 1)  Will schedule mammogram 2)  Saline for sinuses will help clear infection 3)  You need to lose weight. Consider a lower calorie diet and regular exercise.  4)  Reduce fat and sweete in diet Prescriptions: AMOXICILLIN 875 MG TABS (AMOXICILLIN) one two times a day for 7 days Brand medically necessary #14 x 0   Entered and Authorized by:   Luretha Murphy NP   Signed by:   Luretha Murphy NP on 09/06/2009   Method used:   Print then Give to Patient   RxID:   [id number] GUAIFENESIN AC 100-10 MG/5ML SYRP (GUAIFENESIN-CODEINE) 2 tespoonsful qid as needed cough, 150 cc Brand medically necessary #1 x 0   Entered and Authorized by:   Luretha Murphy NP   Signed by:   Luretha Murphy NP on 09/06/2009   Method used:   Print then Give to Patient   RxID:   [id number]

## 2010-08-26 NOTE — Miscellaneous (Signed)
Summary: Medicaid denied Crestor  Clinical Lists Changes  Medications: Removed medication of CRESTOR 40 MG TABS (ROSUVASTATIN CALCIUM) one tab at bedtime (dosage change) Added new medication of SIMVASTATIN 80 MG TABS (SIMVASTATIN) one qhs - Signed Rx of SIMVASTATIN 80 MG TABS (SIMVASTATIN) one qhs;  #30 x 11;  Signed;  Entered by: Luretha Murphy NP;  Authorized by: Luretha Murphy NP;  Method used: [facility] [phone]*, [address], [place], [place]  [address], Ph: [phone] or [phone], Fax: [phone]    Prescriptions: SIMVASTATIN 80 MG TABS (SIMVASTATIN) one qhs  #30 x 11   Entered and Authorized by:   Luretha Murphy NP   Signed by:   Luretha Murphy NP on 10/15/2009   Method used:   [facility]        [facility]. [phone]* (retail)       [address]       [place], [place]  [address]       Ph: [phone] or [phone]       Fax: [phone]   RxID:   [id number]

## 2010-08-26 NOTE — Progress Notes (Signed)
Summary: nos appt  Phone Note Call from Patient   Caller: [email or web address]  Call For: Yechezkel Fertig Summary of Call: LMTCB x2 to rsc nos from 7/5. Initial call taken by: Darletta Moll,  January 29, 2010 2:46 PM

## 2010-08-26 NOTE — Miscellaneous (Signed)
  Clinical Lists Changes  Problems: Removed problem of ROUTINE GYNECOLOGICAL EXAMINATION (ICD-V72.31) Removed problem of TIREDNESS (ICD-780.79) Removed problem of UNSPECIFIED VAGINITIS AND VULVOVAGINITIS (ICD-616.10) Removed problem of ABNORMAL LUNG XRAY (ICD-793.1) Removed problem of COUGH (ICD-786.2) Removed problem of CONTACT OR EXPOSURE TO OTHER VIRAL DISEASES (ICD-V01.79) Removed problem of SCREENING FOR MALIGNANT NEOPLASM OF THE CERVIX (ICD-V76.2)

## 2010-08-26 NOTE — Miscellaneous (Signed)
  Clinical Lists Changes  Observations: Added new observation of PAST MED HX: Adosine myoview-low risk for ischemia 12/2008 Breast cyst aspirated, infectious, treated with ATB, follow up by surgery ([facility])   CHEST PAIN (ICD-786.50) MORBID OBESITY (ICD-278.01) HYPERTENSION, BENIGN SYSTEMIC (ICD-401.1) HYPERLIPIDEMIA (ICD-272.4) DEPRESSION (ICD-311) MIGRAINE, UNSPEC., W/O INTRACTABLE MIGRAINE (ICD-346.90) SINUSITIS, CHRONIC (ICD-473.9) INTRAUTERINE CONTRACEPTIVE DEVICE (ICD-V25.1) IMMUNIZATION DELAY (ICD-V15.9)     (10/18/2009 14:39)  Seen by general surgery for cystic lesion on breast, referred from breast center.  Dr. Luisa Hart recommended watching and follow up as needed.     Past History:  Past Medical History: Adosine myoview-low risk for ischemia 12/2008 Breast cyst aspirated, infectious, treated with ATB, follow up by surgery ([facility])   CHEST PAIN (ICD-786.50) MORBID OBESITY (ICD-278.01) HYPERTENSION, BENIGN SYSTEMIC (ICD-401.1) HYPERLIPIDEMIA (ICD-272.4) DEPRESSION (ICD-311) MIGRAINE, UNSPEC., W/O INTRACTABLE MIGRAINE (ICD-346.90) SINUSITIS, CHRONIC (ICD-473.9) INTRAUTERINE CONTRACEPTIVE DEVICE (ICD-V25.1) IMMUNIZATION DELAY (ICD-V15.9)

## 2010-08-26 NOTE — Assessment & Plan Note (Signed)
Summary: Pulmonary/ ext f/u ov for cough and mscp   Primary Provider/Referring Provider:  Luretha Murphy NP  CC:  4 wk folllowup with cxr.  Pt states that her cough is about 50 % improved.  She still has prod cough with green sputum.  She states that her cough seems to be worse at night and in the early am.  She c/o occ left side pain from cough..  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 stop lisinopril start Diovan 160/12.5 one daily in place of lisnopril Dexilant 60 mg Take  one 30-60 min before first meal of the day x 2 weeks of samples  Prednisone 10 mg 4 each am x 2days, 2x2days, 1x2days and stop sooner if needed with CXR on return    Nov 25, 2009 4 wk folllowup with cxr.  Pt states that her cough is about 50 % improved.  She still has prod cough with green sputum.  She states that her cough seems to be worse at night and in the early am.  She c/o occ left side pain from cough though this is better too.  Pt denies any significant sore throat, dysphagia, itching, sneezing,  nasal congestion or excess secretions,  fever, chills, sweats, unintended wt loss, classically pleuritic or exertional cp, hempoptysis, change in activity tolerance  orthopnea pnd or leg swelling Pt also denies any obvious fluctuation in symptoms with weather or environmental change or other alleviating or aggravating factors.       Current Medications (verified): 1)  Aspirin 81 Mg  Tabs (Aspirin) .[name].. 1 By Mouth Once Daily 2)  Guaifenesin Ac 100-10 Mg/2ml Syrp (Guaifenesin-Codeine) .... 2 Tespoonsful Qid As Needed Cough, 150 Cc 3)  Simvastatin 160 Mg .[name].. 1 By Mouth Daily 4)  Dexilant 60 Mg Cpdr (Dexlansoprazole) .... Take  One 30-60  Min Before First Meal of The Day 5)  Tramadol Hcl 50 Mg  Tabs (Tramadol Hcl) .... One To Two By Mouth Every 4-6 Hours For Cough or Pain  Allergies (verified): 1)  ! * Latex  Past History:  Past Medical History: Adosine myoview-low risk for ischemia 12/2008 Breast cyst aspirated, infectious, treated with ATB, follow up by surgery ([facility]) CHEST PAIN (ICD-786.50) MORBID OBESITY (ICD-278.01) HYPERTENSION, BENIGN SYSTEMIC (ICD-401.1)    - Off ACE October 22, 2009 due to cough > 50% better Nov 25, 2009  HYPERLIPIDEMIA (ICD-272.4) DEPRESSION (ICD-311) MIGRAINE, UNSPEC., W/O INTRACTABLE MIGRAINE (ICD-346.90) SINUSITIS, CHRONIC (ICD-473.9) INTRAUTERINE CONTRACEPTIVE DEVICE (ICD-V25.1) IMMUNIZATION DELAY (ICD-V15.9)    Vital Signs:  Patient profile:   40 year old female Weight:      320.38 pounds O2 Sat:      98 % on Room air Temp:     97.9 degrees F oral Pulse rate:   76 / minute BP sitting:   162 / 90  (left arm) Cuff size:   large  Vitals Entered By: Vernie Murders (Nov 25, 2009 11:56 AM)  O2 Flow:  Room air  Physical Exam  Additional Exam:  amb obese bf nad wt 312 > 322 October 22, 2009  with classic pseudowheeze resolves with purse lip maneuver  wt 322 >  320 Nov 25, 2009  HEENT: nl dentition, turbinates, and orophanx. Nl external ear canals without cough reflex NECK :  without JVD/Nodes/TM/ nl carotid upstrokes bilaterally LUNGS: no acc muscle use, clear to A and P bilaterally without cough on insp or exp maneuvers CV:  RRR  no s3 or murmur or increase in P2, no edema  ABD:  soft and nontender with nl excursion in the supine position. No bruits or organomegaly, bowel sounds nl MS:  warm without deformities, calf tenderness, cyanosis or clubbing    CXR  Procedure date:  11/25/2009  Findings:      Comparison: Chest x-ray of 11/01/2008   Findings: The lungs are clear.  Cardiomegaly is stable.  No bony abnormality is seen.   IMPRESSION: Stable mild cardiomegaly.  No  active lung disease.    Impression & Recommendations:  Problem # 1:  COUGH (ICD-786.2)  Classic Upper airway cough syndrome, so named because it's frequently impossible to sort out how much is  CR/sinusitis with freq throat clearing (which can be related to primary GERD)   vs  causing  secondary extra esophageal GERD from wide swings in gastric pressure that occur with throat clearing, promoting self use of mint and menthol lozenges that reduce the lower esophageal sphincter tone and exacerbate the problem further These are the same pts who not infrequently have failed to tolerate ace inhibitors,  dry powder inhalers or biphosphonates or report having reflux symptoms that don't respond to standard doses of PPI  better off ace and with gerd rx but noct symptoms suggest residual sinus dz so rx x 10 days then sinus ct when returns  Problem # 2:  CHEST PAIN (ICD-786.50) no effusion on cxr or findings on exam, most likely this is mscp from coughing.  Of the three most common causes of chronic cough, only one (GERD) can aggravate or exacerbate the other two and perpetuate the cylce of cough inducing airway trauma, inflammation, heightened sensitivity to reflux which is prompted by the cough itself via a cyclical mechanism.  This may partially respond to steroids and look like asthma and post nasal drainage but never erradicated completely unless the cough and the secondary reflux are eliminated, preferably both at the same time.  See instructions for specific recommendations   Each maintenance medication was reviewed in detail including most importantly the difference between maintenance prns and under what circumstances the prns are to be used.  In addition, these two groups (for which the patient should keep up with refills) were distinguished from a third group :  meds that are used only short term with the intent to complete a course of therapy and then not refill them.  The med list was then fully  reconciled and reorganized to reflect this important distinction.   Problem # 3:  HYPERTENSION, BENIGN SYSTEMIC (ICD-401.1) off rx (ran out of samples)   ACE inhibitors are problematic in  pts with airway complaints because  even experienced pulmonologists can't always distinguish ace effects from copd/asthma.  By themselves they don't actually cause a problem, much like oxygen can't by itself start a fire, but they certainly serve as a powerful catalyst or enhancer for any "fire"  or inflammatory process in the upper airway, be it caused by an ET  tube or more commonly reflux (especially in the obese or pts with known GERD or who are on biphoshonates).  In the era of ARB near equivalency until we have a better handle on the reversibility  of the airway problem, it just makes sense to avoid ace entirely in the short run and then decide later, having established a level of airway control using a reasonable limited regimen, whether to add back ace but even then being very careful to observe the pt for worsening airway control and number of meds used/ needed to control symptoms.    Medications Added to Medication List This Visit: 1)  Simvastatin 160 Mg  .Marland KitchenMarland Kitchen. 1 by mouth daily 2)  Pepcid Ac Maximum Strength 20 Mg Tabs (Famotidine) .... One at bedtime 3)  Diovan Hct 160-12.5 Mg Tabs (Valsartan-hydrochlorothiazide) .... One by mouth daily 4)  Augmentin 875-125 Mg Tabs (Amoxicillin-pot clavulanate) .... By mouth twice daily 5)  Prednisone 10 Mg Tabs (Prednisone) .... 4 each am x 2days, 2x2days, 1x2days and stop  Other Orders: Prescription Created Electronically [phone]) T-2 View CXR ([id number])  Patient Instructions: 1)  Augmentin x 10 days take it bfast and supper and eat cultured yogurt for lunch 2)  Prednisone x 6 days 3)  continue dexilant before bfast and pepcid ac 20 mg one tablet at bedtime 4)  Take delsym two tsp every 12 hours and add tramadol 50 mg up to 2 every 4 hours to suppress the urge to  cough. Swallowing water or using ice chips/non mint and menthol containing candies (such as lifesavers or sugarless jolly ranchers) are also effective.  5)  Please schedule a follow-up appointment in 2 weeks, sooner if needed with all meds in hand Prescriptions: PREDNISONE 10 MG  TABS (PREDNISONE) 4 each am x 2days, 2x2days, 1x2days and stop  #14 x 0   Entered and Authorized by:   Nyoka Cowden MD   Signed by:   Nyoka Cowden MD on 11/25/2009   Method used:   [facility]        [facility] [phone]* (retail)       [address]       [place], [place]  [address]       Ph: [phone] or [phone]       Fax: [phone]   RxID:   [id number] AUGMENTIN 875-125 MG  TABS (AMOXICILLIN-POT CLAVULANATE) By mouth twice daily  #20 x 0   Entered and Authorized by:   Nyoka Cowden MD   Signed by:   Nyoka Cowden MD on 11/25/2009   Method used:   [facility] to        [facility] [phone]* (retail)       [address], [place]  [address]       Ph: [phone] or [phone]       Fax: [phone]   RxID:   [id number] TRAMADOL HCL 50 MG  TABS (TRAMADOL HCL) One to two by mouth every 4-6 hours for cough or pain  #40 x 0   Entered and Authorized by:   Nyoka Cowden MD   Signed by:   Nyoka Cowden MD on 11/25/2009   Method used:   [facility] to        [facility] [phone]* (retail)       [address], [place]  [address]       Ph: [phone] or [phone]       Fax: [phone]   RxID:   [id number] DIOVAN HCT 160-12.5 MG  TABS (VALSARTAN-HYDROCHLOROTHIAZIDE) One by mouth daily  #34 x  11   Entered and Authorized by:   Nyoka Cowden MD   Signed by:   Nyoka Cowden MD on 11/25/2009   Method used:   [facility] to        [facility]. [phone]* (retail)       [address] [place]  [address]       Ph: [phone] or [phone]       Fax: [phone]   RxID:   [id number]    CXR  Procedure date:   11/25/2009  Findings:      Comparison: Chest x-ray of 11/01/2008   Findings: The lungs are clear.  Cardiomegaly is stable.  No bony abnormality is seen.   IMPRESSION: Stable mild cardiomegaly.  No active lung disease.    Appended Document: Orders Update    Clinical Lists Changes  Orders: Added new Service order of Est. Patient Level IV ([address]) - Signed

## 2010-08-26 NOTE — Progress Notes (Signed)
Summary: triage  Phone Note Call from Patient Call back at Home Phone [phone]   Caller: Patient Summary of Call: Pt seen last week and given Amoxicillin.  Took them for a couple a days and stopped taking them because she thought they were giving her a headache.  Now still has very bad headache.  Asking to be seen. Initial call taken by: Clydell Hakim,  September 11, 2009 11:54 AM  Follow-up for Phone Call        was taking amox for a sinus infection. stopped the med saturday due to HA. thinks the dose was too high. states it was 875 mg.  still is sick & has a HA. work in appt tomorrw at 8:30 per her request. has other aPPTS TODAY Follow-up by: Golden Circle RN,  September 11, 2009 12:01 PM

## 2010-08-26 NOTE — Miscellaneous (Signed)
  Clinical Lists Changes  Medications: Removed medication of SIMVASTATIN 80 MG  TABS (SIMVASTATIN) One tablet daily - Signed Added new medication of CRESTOR 20 MG TABS (ROSUVASTATIN CALCIUM) one tab at bedtime (will need PA, no reduction on simvastatin) - Signed Rx of CRESTOR 20 MG TABS (ROSUVASTATIN CALCIUM) one tab at bedtime (will need PA, no reduction on simvastatin);  #30 x 6;  Signed;  Entered by: Luretha Murphy NP;  Authorized by: Luretha Murphy NP;  Method used: Electronically to [facility] [address], [place], [place], [place]  [address], Ph: [phone], Fax: [phone] Medicaid had deneid Crestor after she has been on for years; she failed simvastatin 80 mg with an LDL of 188 after 2 months of therapy.  Her FH is such that her mother died of sudden cardiac death in her 57s and father with multiple stents and CABG in his 80s.  She requires a more potent statin to reduce risk of heart disease.  She also needs to loose over 100 pounds, she is struggling with this task.   Prescriptions: CRESTOR 20 MG TABS (ROSUVASTATIN CALCIUM) one tab at bedtime (will need PA, no reduction on simvastatin)  #30 x 6   Entered and Authorized by:   Luretha Murphy NP   Signed by:   Luretha Murphy NP on 04/08/2010   Method used:   Electronically to        [facility] (retail)       [address]       [place], [place]  [address]       Ph: [phone]       Fax: [phone]   RxID:   [id number]

## 2010-08-26 NOTE — Assessment & Plan Note (Signed)
Summary: Pulmonary/ cough better off ACE but ? sinus dz > rx avelox   Primary Provider/Referring Provider:  Luretha Murphy NP  CC:  4 wk followup.  Pt states that her cough is about the same- no better or worse.  She states that tramadol and delsym help some.  She never picked up her rx for prednisone.  No new complaints today and .  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 stop lisinopril start Diovan 160/12.5 one daily in place of lisnopril Dexilant 60 mg Take  one 30-60 min before first meal of the day x 2 weeks of samples  Prednisone 10 mg 4 each am x 2days, 2x2days, 1x2days and stop sooner if needed with CXR on return    Nov 25, 2009 4 wk folllowup with cxr.  Pt states that her cough is about 50 % improved.  She still has prod cough with green sputum.  She states that her cough seems to be worse at night and in the early am.  She c/o occ left side pain from cough though this is better too.  rec augmentin  but couldn't afford it or prednsione at [facility] despite medicaid. no sob   Pt denies any significant sore throat, dysphagia, itching, sneezing,  nasal congestion or excess secretions,  fever, chills, sweats, unintended wt loss, pleuritic or exertional cp, hempoptysis, change in activity tolerance  orthopnea pnd or leg swelling Pt also denies any obvious fluctuation in symptoms with weather or environmental change or other alleviating or aggravating factors.       Current Medications (verified): 1)  Aspirin 81 Mg  Tabs (Aspirin) .[name].. 1 By Mouth Once Daily 2)  Simvastatin 160 Mg .[name].. 1 By Mouth Daily 3)  Dexilant 60 Mg Cpdr (Dexlansoprazole) .... Take  One 30-60 Min Before First Meal of The Day 4)  Tramadol  Hcl 50 Mg  Tabs (Tramadol Hcl) .... One To Two By Mouth Every 4-6 Hours For Cough or Pain  Allergies (verified): 1)  ! * Latex  Vital Signs:  Patient profile:   40 year old female Weight:      318 pounds O2 Sat:      98 % on Room air Temp:     98.3 degrees F oral Pulse rate:   76 / minute BP sitting:   152 / 94  (left arm) Cuff size:   large  Vitals Entered ByVernie Murders (Dec 09, 2009 10:32 AM)  O2 Flow:  Room air  Physical Exam  Additional Exam:  amb obese bf nad wt 312 > 322 October 22, 2009 > 318 Dec 09, 2009  no longer  with classic pseudowheeze  wt 322 > 320 Nov 25, 2009  HEENT: nl dentition, turbinates, and orophanx. Nl external ear canals without cough reflex NECK :  without JVD/Nodes/TM/ nl carotid upstrokes bilaterally LUNGS: no acc muscle use, clear to A and P bilaterally without cough on insp or exp maneuvers CV:  RRR  no s3 or murmur or increase in P2, no edema  ABD:  soft and nontender with nl excursion in the supine position. No bruits or organomegaly, bowel sounds nl MS:  warm without deformities, calf tenderness, cyanosis or clubbing    Impression & Recommendations:  Problem # 1:  COUGH (ICD-786.2)  Classic Upper airway cough syndrome, so named because it's frequently impossible to sort out how much is  CR/sinusitis with freq throat clearing (which can be related to primary GERD)   vs  causing  secondary extra esophageal GERD from wide swings in gastric pressure that occur with throat clearing, promoting self use of mint and menthol lozenges that reduce the lower esophageal sphincter tone and exacerbate the problem further These are the same pts who not infrequently have failed to tolerate ace inhibitors,  dry powder inhalers or biphosphonates or report having reflux symptoms that don't respond to standard doses of PPI  better off ace and with gerd rx but noct symptoms suggest residual sinus dz so rx x 5 days with avelox then ct if not better.  The  standardized cough guidelines recently published in Chest are a 14 step process, not a single office visit,  and are intended  to address this problem logically,  with an alogrithm dependent on response to each progressive step  to determine a specific diagnosis with  minimal addtional testing needed. Therefore if compliance is an issue this empiric standardized approach simply won't work.   Need to see 100% compliance with setp one before consider step 2   Each maintenance medication was reviewed in detail including most importantly the difference between maintenance prns and under what circumstances the prns are to be used.  In addition, these two groups (for which the patient should keep up with refills) were distinguished from a third group :  meds that are used only short term with the intent to complete a course of therapy and then not refill them.  The med list was then fully reconciled and reorganized to reflect this important distinction.   Orders: Est. Patient Level IV ([address])  Problem # 2:  HYPERTENSION, BENIGN SYSTEMIC (ICD-401.1)  not optimal  The following medications were removed from the medication list:    Diovan Hct 160-12.5 Mg Tabs (Valsartan-hydrochlorothiazide) ..... One by mouth daily Her updated medication list for this problem includes:    Diovan Hct 160-25 Mg Tabs (Valsartan-hydrochlorothiazide) ..... One by mouth daily  Orders: Est. Patient Level IV ([address])  Problem # 3:  ABNORMAL LUNG XRAY (ICD-793.1)  Baseline cxr compared to the one she brought in and the new one just appears to be @ lower lung vol so simply need to repeat on return at full insp and compare apples to apples.  doubt she has sarcoid or ILD but need to excude this on f/u.  Orders: Est. Patient Level IV ([address])  Medications Added to Medication List This Visit: 1)  Simvastatin 80 Mg Tabs (Simvastatin) .... One tablet daily 2)  Pepcid Ac Maximum Strength 20 Mg Tabs (Famotidine) .... One at  bedtime 3)  Diovan Hct 160-25 Mg Tabs (Valsartan-hydrochlorothiazide) .... One by mouth daily 4)  Tramadol Hcl 50 Mg Tabs (Tramadol hcl) .... One to two by mouth every 4-6 hours for cough or pain 5)  Avelox 400 Mg Tabs (Moxifloxacin hcl) .... By mouth daily x 5 days 6)  Prednisone 10 Mg Tabs (Prednisone) .... 4 each am x 2days, 2x2days, 1x2days and stop  Patient Instructions: 1)  Continue dexilant Take  one 30-60 min before first meal of the day and peccid 20 mg at bedtime 2)  Suppress cough complteley with Tramadol 3)  Prednisone x 6 days 4)  Avelox  x 5 days and call if sputum still green for sinus  [id number] and ask for Almyra Free to schedule sinus CT 5)  1)  Think of your medications in 3 separate categories and keep them separate:  6)  2)  The ones you take no matter what daily on a scheduled basis 7)  3)  The ones you only take if needed for specific problems 8)  4)  The ones you take for a short course and stop, like antibiotics and prednisone. 9)   Please schedule a follow-up appointment in 2 weeks, sooner if needed bring all active medications with you Prescriptions: PREDNISONE 10 MG  TABS (PREDNISONE) 4 each am x 2days, 2x2days, 1x2days and stop  #14 x 0   Entered and Authorized by:   Nyoka Cowden MD   Signed by:   Nyoka Cowden MD on 12/09/2009   Method used:   Electronically to        [facility] (retail)       [address]       [place], [place]  [address]       Ph: [phone]       Fax: [phone]   RxID:   [id number] TRAMADOL HCL 50 MG  TABS (TRAMADOL HCL) One to two by mouth every 4-6 hours for cough or pain  #40 x 0   Entered and Authorized by:   Nyoka Cowden MD   Signed by:   Nyoka Cowden MD on 12/09/2009   Method used:   Electronically to        [facility] (retail)       [address]       [place], [place]  [address]       Ph: [phone]       Fax: [phone]   RxID:   [id number]

## 2010-08-26 NOTE — Progress Notes (Signed)
Summary: nos appt  Phone Note Call from Patient   Caller: [email or web address]  Call For: Danielle Shaw Summary of Call: LMTCB x2 to rsc nos from 6/6. Initial call taken by: Darletta Moll,  December 31, 2009 3:16 PM

## 2010-08-26 NOTE — Miscellaneous (Signed)
  Clinical Lists Changes  Medications: Changed medication from CRESTOR 20 MG  TABS (ROSUVASTATIN CALCIUM) one daily (new script, had been on Lipitor will change to Crestor) to CRESTOR 40 MG TABS (ROSUVASTATIN CALCIUM) one tab at bedtime (dosage change) - Signed Rx of CRESTOR 40 MG TABS (ROSUVASTATIN CALCIUM) one tab at bedtime (dosage change);  #30 x 6;  Signed;  Entered by: Luretha Murphy NP;  Authorized by: Luretha Murphy NP;  Method used: [facility] to [facility] [phone]*, [address] [place], [place]  [address], Ph: [phone] or [phone], Fax: [phone] Orders: Added new Test order of Direct LDL[facility] [phone]) - Signed Added new Test order of Hepatic[facility] [id number]) - Signed Added new Test order of Hepatic[facility] [phone]) - Signed Added new Test order of [facility] LDL[facility] ([id number]) - Signed    Prescriptions: CRESTOR 40 MG TABS (ROSUVASTATIN CALCIUM) one tab at bedtime (dosage change)  #30 x 6   Entered and Authorized by:   Luretha Murphy NP   Signed by:   Luretha Murphy NP on 09/10/2009   Method used:   [facility]        [facility] [phone]* (retail)       [address]       [place], [place]  [address]       Ph: [phone] or [phone]       Fax: [phone]   RxID:   [id number]

## 2010-08-28 NOTE — Assessment & Plan Note (Signed)
Summary: lower back/side pain/eo   Vital Signs:  Patient profile:   40 year old female Height:      65 inches Weight:      316 pounds BMI:     52.78 Temp:     98.7 degrees F oral Pulse rate:   78 / minute BP sitting:   167 / 90  (right arm) Cuff size:   large  Vitals Entered By: Renato Battles slade,cma CC: mid back pain radiates into right side. migraine headache x 1 week. cough x 5 days. feels nauseated and also increased urination. IUD x 7 years. Is Patient Diabetic? No Pain Assessment Patient in pain? yes     Location: head Intensity: 7 Onset of pain  x 1 week   Primary Care Provider:  Luretha Murphy NP  CC:  mid back pain radiates into right side. migraine headache x 1 week. cough x 5 days. feels nauseated and also increased urination. IUD x 7 years..  History of Present Illness: Back pain for one week in the right flank area.   Getting worse, increased with coughing radiates some to the spine but not down into buttocks or leg  URI that has persisted for 6 days Increased cough Right ear pain right pain behind eye   Habits & Providers  Alcohol-Tobacco-Diet     Tobacco Status: never  Allergies: 1)  ! * Latex  Review of Systems General:  Denies fever, loss of appetite, malaise, and sweats. ENT:  Complains of earache. Resp:  Complains of cough.  Physical Exam  General:  Well appearing, in no acute distress, morbidly obese Ears:  right TM bright red, left TM injected and retracted Mouth:  + pnd Lungs:  clear Heart:  RRR Msk:  tender to palpation in the left flank area negative straight leg raises    Impression & Recommendations:  Problem # 1:  UNSPECIFIED BACKACHE (ICD-724.5) Suspect that she has an acute viral illness, coughing has made pulled muscle in flank area worse.  Short term use of VIcodin to rest, control cough, treat back pain and headache.  Lots of fluids.  She is to call next week if she is still having sinus problems. The following medications  were removed from the medication list:    Tramadol Hcl 50 Mg Tabs (Tramadol hcl) ..... One to two by mouth every 4-6 hours for cough or pain Her updated medication list for this problem includes:    Aspirin 81 Mg Tabs (Aspirin) .[name]... 1 by mouth once daily    Hydrocodone-acetaminophen 5-500 Mg Tabs (Hydrocodone-acetaminophen) ..... One tab q 4-6 hours as needed for pain and cough  Orders: [facility]ysis-[facility] (00000) [facility]- Est Level  3 ([address])  Problem # 2:  MIGRAINE, UNSPEC., W/O INTRACTABLE MIGRAINE (ICD-346.90)  likely triggered by the viral illness The following medications were removed from the medication list:    Tramadol Hcl 50 Mg Tabs (Tramadol hcl) ..... One to two by mouth every 4-6 hours for cough or pain Her updated medication list for this problem includes:    Aspirin 81 Mg Tabs (Aspirin) .[name]... 1 by mouth once daily    Hydrocodone-acetaminophen 5-500 Mg Tabs (Hydrocodone-acetaminophen) ..... One tab q 4-6 hours as needed for pain and cough  Orders: [facility]- Est Level  3 ([address])  Complete Medication List: 1)  Aspirin 81 Mg Tabs (Aspirin) .[name].. 1 by mouth once daily 2)  Polyethylene Glycol 3350 Powd (Polyethylene glycol 3350) .Marland KitchenMarland Kitchen. 17 gm in 4 oz water two times a day for one week  then daily, qs 3)  Omeprazole 20 Mg Cpdr (Omeprazole) .... Take one daily 4)  Famotidine 40 Mg Tabs (Famotidine) .... One qhs 5)  Amlodipine Besylate 5 Mg Tabs (Amlodipine besylate) .... One daily 6)  Crestor 20 Mg Tabs (Rosuvastatin calcium) .... One tab at bedtime (will need pa, no reduction on simvastatin) 7)  Hydrocodone-acetaminophen 5-500 Mg Tabs (Hydrocodone-acetaminophen) .... One tab q 4-6 hours as needed for pain and cough  Other Orders: U Preg-[facility] ([address])  Patient Instructions: 1)  May use pain meds for acute pain control and manage your cough 2)  Lots of water 3)  You may get constipated so may need to take a laxative Prescriptions: HYDROCODONE-ACETAMINOPHEN 5-500 MG TABS  (HYDROCODONE-ACETAMINOPHEN) one tab q 4-6 hours as needed for pain and cough Brand medically necessary #30 x 0   Entered and Authorized by:   Luretha Murphy NP   Signed by:   Luretha Murphy NP on 08/15/2010   Method used:   Print then Give to Patient   RxID:   [id number]    Orders Added: 1)  Urinalysis-[facility] [00000] 2)  U Preg-[facility] [810[address]] 3)  [facility]- Est Level  3 [[address]]    Laboratory Results   Urine Tests  Date/Time Received: August 15, 2010 11:54 AM  Date/Time Reported: August 15, 2010 12:04 PM   Routine Urinalysis   Color: yellow Appearance: Clear Glucose: negative   (Normal Range: Negative) Bilirubin: negative   (Normal Range: Negative) Ketone: negative   (Normal Range: Negative) Spec. Gravity: 1.015   (Normal Range: 1.003-1.035) Blood: negative   (Normal Range: Negative) pH: 7.5   (Normal Range: 5.0-8.0) Protein: negative   (Normal Range: Negative) Urobilinogen: 0.2   (Normal Range: 0-1) Nitrite: negative   (Normal Range: Negative) Leukocyte Esterace: negative   (Normal Range: Negative)    Urine HCG: negative Comments: .........Marland Kitchenbiochemical negative; microscopic not indicated ...............test performed by......Marland KitchenBonnie A. [place], MLS (ASCP)cm

## 2010-10-03 ENCOUNTER — Emergency Department (HOSPITAL_COMMUNITY): Payer: Medicaid Other

## 2010-10-03 ENCOUNTER — Emergency Department (HOSPITAL_COMMUNITY)
Admission: EM | Admit: 2010-10-03 | Discharge: 2010-10-03 | Disposition: A | Discharge status: Home / Self Care | Payer: Medicaid Other | Attending: Emergency Medicine | Admitting: Emergency Medicine

## 2010-10-03 DIAGNOSIS — M25579 Pain in unspecified ankle and joints of unspecified foot: Secondary | ICD-10-CM | POA: Insufficient documentation

## 2010-10-03 DIAGNOSIS — I10 Essential (primary) hypertension: Secondary | ICD-10-CM | POA: Insufficient documentation

## 2010-10-03 DIAGNOSIS — X58XXXA Exposure to other specified factors, initial encounter: Secondary | ICD-10-CM | POA: Insufficient documentation

## 2010-10-03 DIAGNOSIS — S93609A Unspecified sprain of unspecified foot, initial encounter: Secondary | ICD-10-CM | POA: Insufficient documentation

## 2010-10-03 IMAGING — CR DG ANKLE COMPLETE 3+V*L*
3 series · 3 of 3 positions shown · non-contrast
Comparison: None.

CLINICAL DATA: Pain, no known injury

LEFT ANKLE COMPLETE - 3+ VIEW

[t ankle joint ap left]
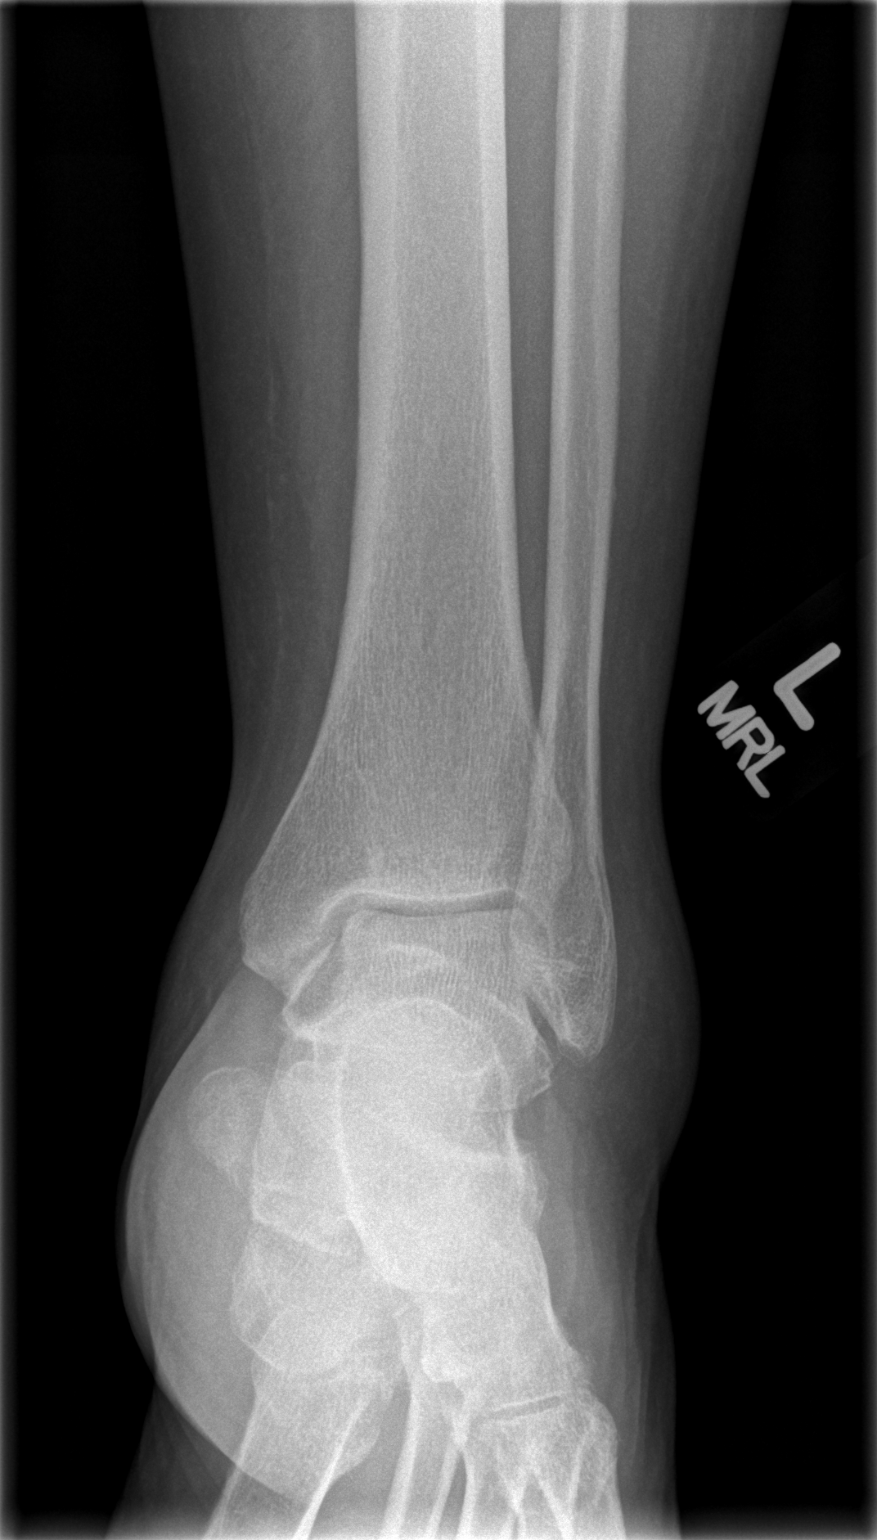

[t ankle joint oblique left]
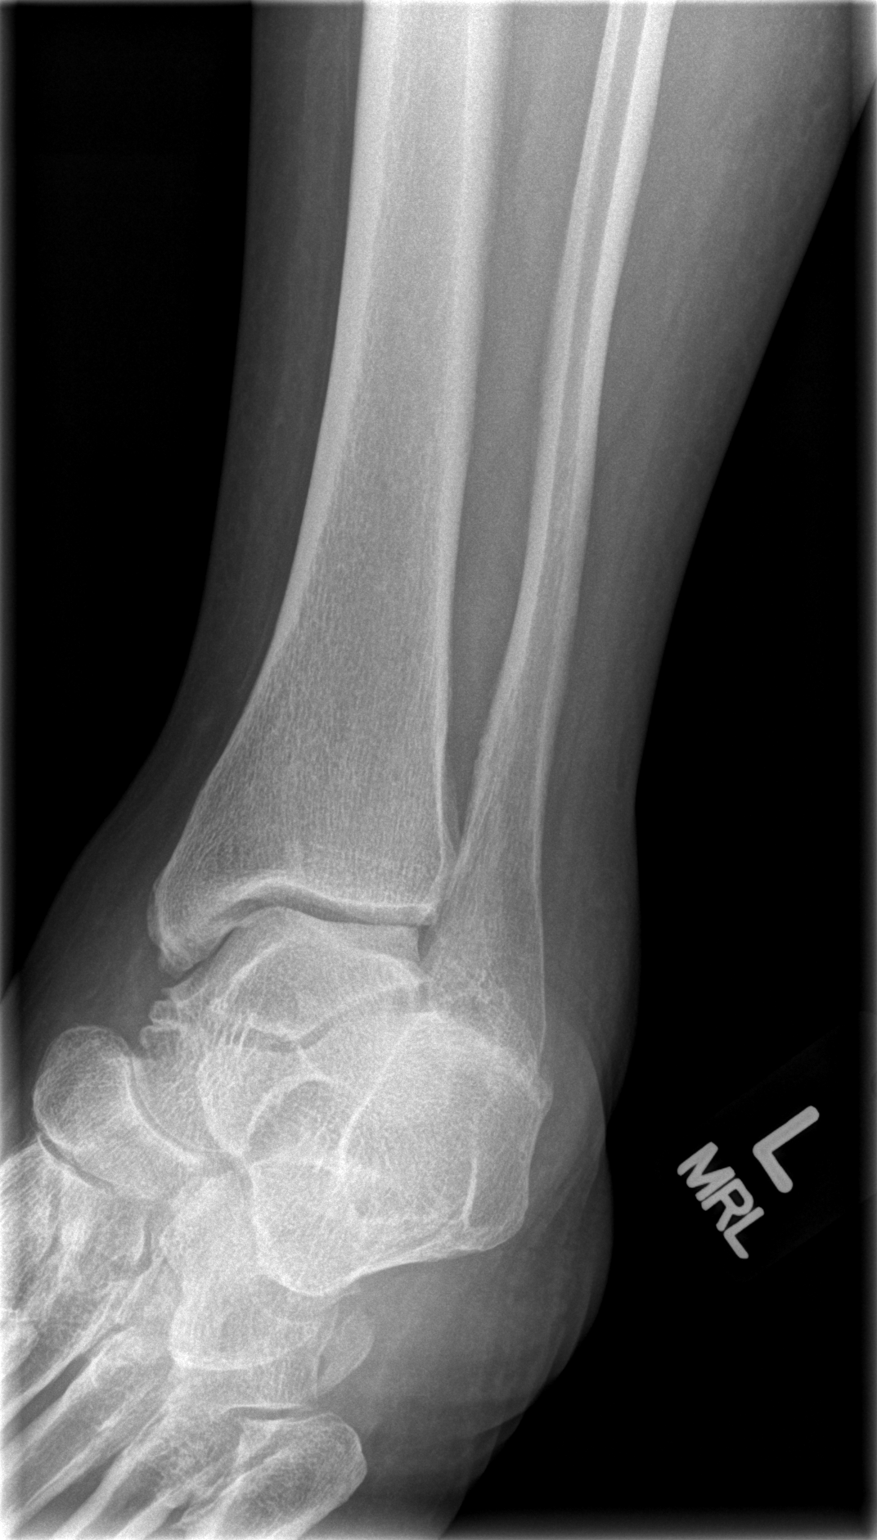

[t ankle joint lat left]
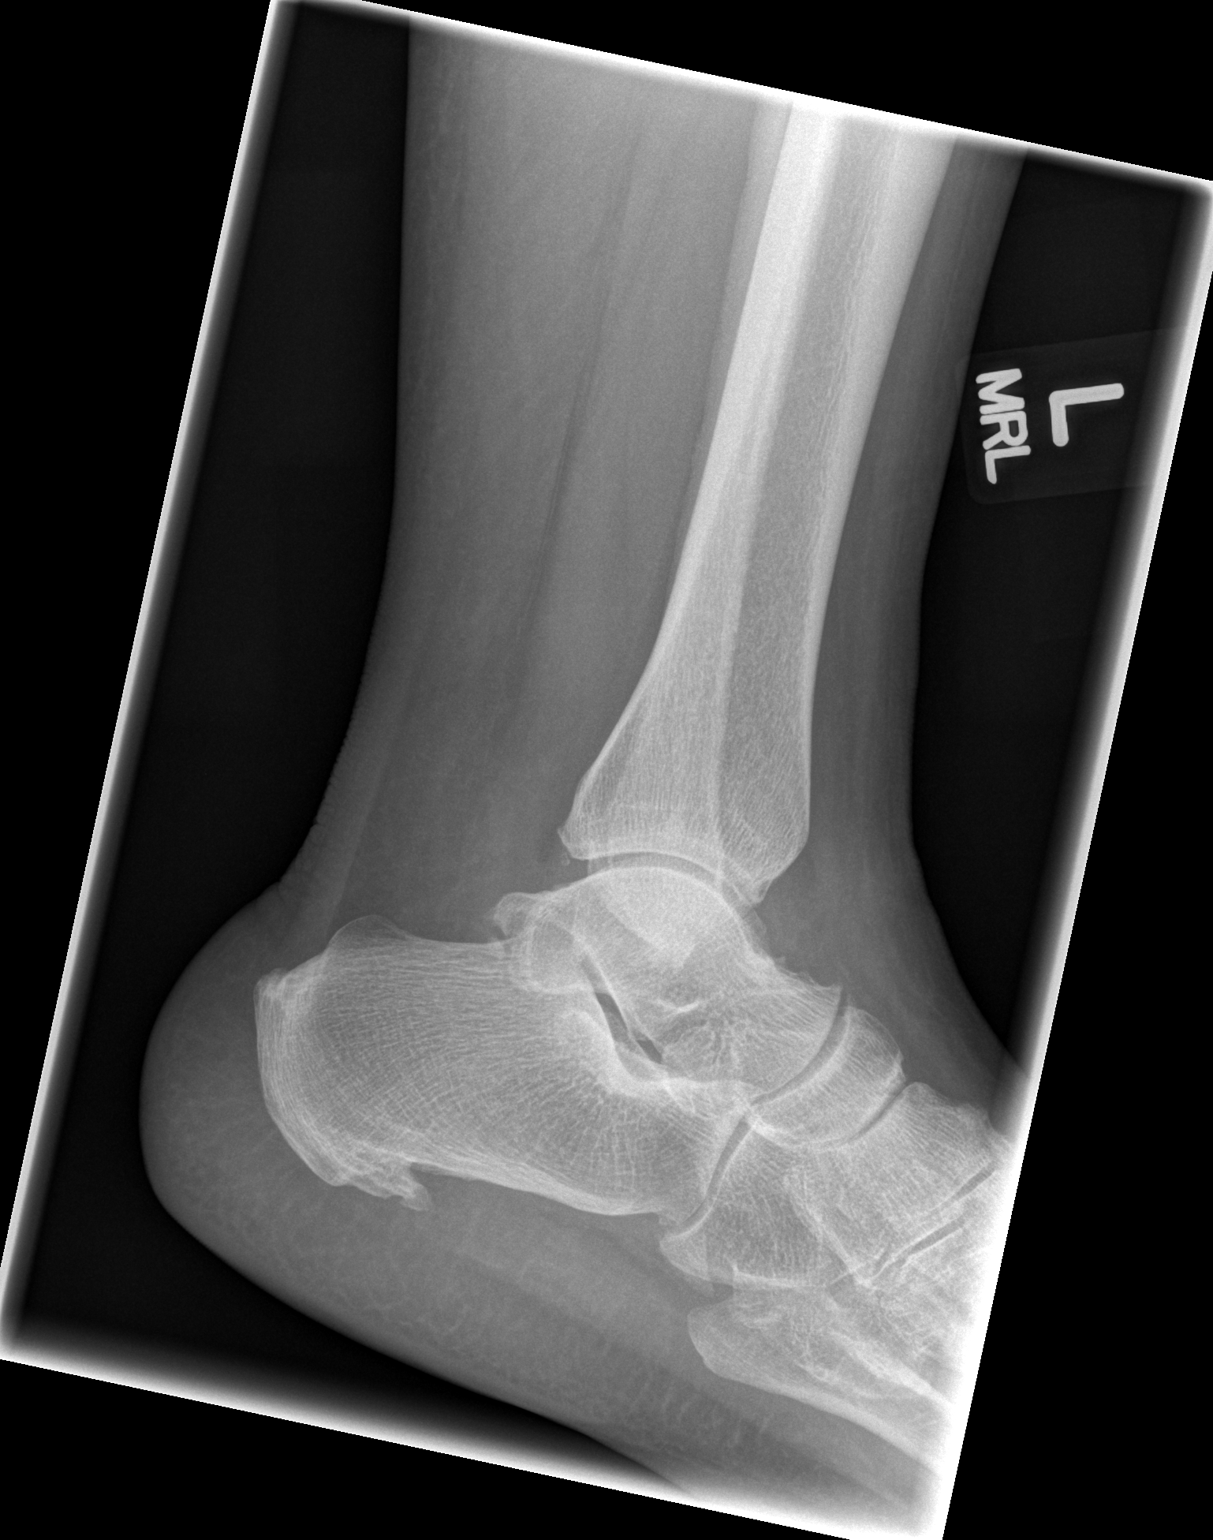

[3 of 3 positions shown; findings below may reference images not displayed]

FINDINGS: Three views of the left ankle submitted.  No acute
fracture or subluxation.  Soft tissue swelling noted adjacent to
lateral malleolus.  Plantar spur of the calcaneus is noted.
IMPRESSION: No acute fracture or subluxation.  Soft tissue swelling is noted
adjacent to lateral malleolus.  Plantar spurring of the calcaneus.

## 2010-10-07 ENCOUNTER — Encounter: Payer: Self-pay | Admitting: Family Medicine

## 2010-10-07 ENCOUNTER — Ambulatory Visit (INDEPENDENT_AMBULATORY_CARE_PROVIDER_SITE_OTHER): Payer: Medicaid Other | Admitting: Family Medicine

## 2010-10-07 VITALS — BP 150/98 | HR 80 | Wt 303.8 lb

## 2010-10-07 DIAGNOSIS — S93409A Sprain of unspecified ligament of unspecified ankle, initial encounter: Secondary | ICD-10-CM

## 2010-10-07 DIAGNOSIS — B029 Zoster without complications: Secondary | ICD-10-CM | POA: Insufficient documentation

## 2010-10-07 DIAGNOSIS — S93402A Sprain of unspecified ligament of left ankle, initial encounter: Secondary | ICD-10-CM | POA: Insufficient documentation

## 2010-10-07 MED ORDER — VALACYCLOVIR HCL 1 G PO TABS: 1000.0000 mg | ORAL_TABLET | Freq: Three times a day (TID) | ORAL | Status: DC

## 2010-10-07 NOTE — Patient Instructions (Addendum)
Ibuprofen 800 mg tid for 3 days then tid prn Elevate when not walking Pick up ankle brace and wear until your ankle is normal Begin to do the ABC exercises tomorrow In one week do the balance exercises This may take 3 months to get completely better Valtrex for shingles

## 2010-10-08 NOTE — Progress Notes (Signed)
  Subjective:    Patient ID: Danielle Shaw, female    DOB: 27-Jul-1971, 40 y.o.   MRN: [id number]  HPI: left ankle pain, seen at ER and negative Xray, she is not sure what happened.  She has been working as a Clinical biochemist and worked all day, went to bed and woke up with swelling and pain.  Ankle is very painful when she is weight bearing.  She is using crutches and an ACE wrap.  She is morbidly obese at 303 lbs.   She has a painful itchy rash on her right upper back that started 2 days ago.  We discussed her poor eating habits, as she complains that she cannot eat and is not eating at all but her weight tells a different story.  Her habits are going a day or more without out eating food, drinks a lot of sweet drinks.  Does cook for the kids and eats that food sometimes.  Eats a lot of snack foods and fast food.  Ankle: No erythema + lateral swelling. Range of motion is full in all directions, patient reports pain. Strength is 5/5 in all directions. Stable lateral and medial ligaments; No pain at base of 5th MT; No tenderness over cuboid; No tenderness over N spot or navicular prominence mild tenderness on posterior aspects of lateral malleolus, less tender mediall Able to walk 4 steps, bearing weight     Review of Systems  Constitutional:       See HPI       Objective:   Physical Exam See HPI for ankle exam She has a cluster of raised red lesions over her right scapular lesion, distribution consistent with unilateral dermatomal pattern,        Assessment & Plan:

## 2010-10-12 ENCOUNTER — Emergency Department (HOSPITAL_COMMUNITY)
Admission: EM | Admit: 2010-10-12 | Discharge: 2010-10-12 | Disposition: A | Discharge status: Home / Self Care | Payer: Medicaid Other | Attending: Emergency Medicine | Admitting: Emergency Medicine

## 2010-10-12 DIAGNOSIS — L02219 Cutaneous abscess of trunk, unspecified: Secondary | ICD-10-CM | POA: Insufficient documentation

## 2010-10-12 DIAGNOSIS — I10 Essential (primary) hypertension: Secondary | ICD-10-CM | POA: Insufficient documentation

## 2010-10-12 DIAGNOSIS — L03319 Cellulitis of trunk, unspecified: Secondary | ICD-10-CM | POA: Insufficient documentation

## 2010-10-14 ENCOUNTER — Encounter: Payer: Self-pay | Admitting: Family Medicine

## 2010-10-14 ENCOUNTER — Ambulatory Visit (INDEPENDENT_AMBULATORY_CARE_PROVIDER_SITE_OTHER): Payer: Medicaid Other | Admitting: Family Medicine

## 2010-10-14 VITALS — BP 138/95 | HR 95 | Temp 98.1°F | Ht 63.0 in | Wt 304.0 lb

## 2010-10-14 DIAGNOSIS — S93409A Sprain of unspecified ligament of unspecified ankle, initial encounter: Secondary | ICD-10-CM

## 2010-10-14 DIAGNOSIS — L0390 Cellulitis, unspecified: Secondary | ICD-10-CM

## 2010-10-14 DIAGNOSIS — S93402A Sprain of unspecified ligament of left ankle, initial encounter: Secondary | ICD-10-CM

## 2010-10-14 DIAGNOSIS — L0291 Cutaneous abscess, unspecified: Secondary | ICD-10-CM

## 2010-10-14 NOTE — Assessment & Plan Note (Signed)
Differential is zoster vs boils, there are several lesions.  Treat with Valtrex

## 2010-10-14 NOTE — Patient Instructions (Signed)
Complete doxycycline Drink lots of fluids Change dressing in 48 hours then as needed Begin walking on ankle, with splint for another few weeks Begin ABC exercises and balancing 2 week follow up, AM apt.

## 2010-10-14 NOTE — Assessment & Plan Note (Signed)
I&D in ER, currently taking doxycycline, packing removed.  Will check HIV status with fasting labs in 2 weeks.  Discouraged tattoos.

## 2010-10-14 NOTE — Assessment & Plan Note (Signed)
Major problem and puts her at risk for ankle sprains, skin infections, and overall poor health.  She is positive about making changes, her daughter has an apt with Dr. Gerilyn Pilgrim, they are definitely contemplating weight loss for the first time.

## 2010-10-14 NOTE — Assessment & Plan Note (Signed)
Patient will elevate, ice, wear a ASO to prevent further injury.  She will begin ibuprofen for 3 days and start QBC exercises.  She cannot work until she is stable on her feet.  She is to stop using the crutches as soon as possible and wear supportive shoes.

## 2010-10-14 NOTE — Assessment & Plan Note (Signed)
Discontinue crutches, continue ASO for a few more weeks, ABC exercises

## 2010-10-14 NOTE — Progress Notes (Signed)
  Subjective:    Patient ID: Danielle Shaw, female    DOB: [birth year], 40 y.o.   MRN: [id number]  HPI:  Area on back that was suspected as being zoster, turned out to be a deep abscess that required a deep I&D in the ER 3 days ago.  She had a tattoo placed one month ago in the area.  She is currently taking doxycycline and is having some discomfort in the area.  Right ankle sprain improving, she is wearing the ASO. She has not gone back to work and has been fired.  She is drinking water and eating fruit and veges.    Review of Systems  Constitutional: Negative for fever.  Musculoskeletal: Positive for joint swelling.       Right ankle still swollen  Skin: Positive for wound.       Wound from I&D performed in the ER 2 days ago, packing removed.       Objective:   Physical Exam  Constitutional: No distress.  Musculoskeletal: She exhibits edema.       Able to walk 25 yards without crutches   Skin:       Packing about 4 cm in length was removed, wound was underlying new tattoo.  Appeared clean, dressing applied          Assessment & Plan:

## 2010-10-28 ENCOUNTER — Encounter: Payer: Self-pay | Admitting: Family Medicine

## 2010-10-28 ENCOUNTER — Ambulatory Visit (INDEPENDENT_AMBULATORY_CARE_PROVIDER_SITE_OTHER): Payer: Medicaid Other | Admitting: Family Medicine

## 2010-10-28 ENCOUNTER — Other Ambulatory Visit: Payer: Medicaid Other

## 2010-10-28 VITALS — BP 138/95 | HR 70 | Temp 98.2°F | Ht 64.5 in | Wt 301.7 lb

## 2010-10-28 DIAGNOSIS — S93402A Sprain of unspecified ligament of left ankle, initial encounter: Secondary | ICD-10-CM

## 2010-10-28 DIAGNOSIS — S93409A Sprain of unspecified ligament of unspecified ankle, initial encounter: Secondary | ICD-10-CM

## 2010-10-28 DIAGNOSIS — E785 Hyperlipidemia, unspecified: Secondary | ICD-10-CM

## 2010-10-28 DIAGNOSIS — Z20828 Contact with and (suspected) exposure to other viral communicable diseases: Secondary | ICD-10-CM

## 2010-10-28 DIAGNOSIS — Z975 Presence of (intrauterine) contraceptive device: Secondary | ICD-10-CM | POA: Insufficient documentation

## 2010-10-28 LAB — LDL CHOLESTEROL, DIRECT: Direct LDL: 177 mg/dL — ABNORMAL HIGH

## 2010-10-28 NOTE — Patient Instructions (Signed)
Return in 3 months.

## 2010-10-28 NOTE — Progress Notes (Signed)
  Subjective:    Patient ID: Danielle Shaw, female    DOB: [birth year], 40 y.o.   MRN: [id number]  HPI:  Follow up for high ankle sprain, improving and now only wearing ASO when out side.  Has joined the gym and is working out in the water every morning, now accepts the fact that she need to loose weight.  She has changed the food at home, and is cooking more.  She is only drinking water and eating natural foods.    Review of Systems  Constitutional:       Feels better with weight loss and exercise  Musculoskeletal: Positive for joint swelling.       Left ankle swollen laterally, able to walk without difficulty       Objective:   Physical Exam        Assessment & Plan:

## 2010-10-28 NOTE — Progress Notes (Signed)
Collected 2 SST tubes for tests: LDL CMET HIV Antibody Aletha Halim

## 2010-10-28 NOTE — Assessment & Plan Note (Signed)
Improving, continue ABC exercises, and balancing.  Expect full recovery.

## 2010-10-28 NOTE — Assessment & Plan Note (Signed)
Patient has lost 16 pounds in 3 months

## 2010-10-28 NOTE — Assessment & Plan Note (Signed)
Trial off Crestor because of Medicaid not paying showed significant increase in LDL to 188, Mother had early death from MI in her 83s.  Back on Crestor and will check LDL and hepatic

## 2010-10-29 ENCOUNTER — Encounter: Payer: Self-pay | Admitting: Family Medicine

## 2010-10-29 LAB — COMPREHENSIVE METABOLIC PANEL
ALT: 12 U/L (ref 0–35)
CO2: 24 mEq/L (ref 19–32)
Creat: 0.73 mg/dL (ref 0.40–1.20)
Glucose, Bld: 99 mg/dL (ref 70–99)
Total Bilirubin: 0.4 mg/dL (ref 0.3–1.2)

## 2010-10-29 LAB — HIV ANTIBODY (ROUTINE TESTING W REFLEX): HIV: NONREACTIVE

## 2010-11-05 LAB — COMPREHENSIVE METABOLIC PANEL
ALT: 15 U/L (ref 0–35)
Albumin: 3.7 g/dL (ref 3.5–5.2)
Alkaline Phosphatase: 101 U/L (ref 39–117)
GFR calc Af Amer: >60 mL/min (ref >60)
Potassium: 3.5 mEq/L (ref 3.5–5.1)
Sodium: 137 mEq/L (ref 135–145)
Total Protein: 8 g/dL (ref 6.0–8.3)

## 2010-11-05 LAB — CARDIAC PANEL(CRET KIN+CKTOT+MB+TROPI)
CK, MB: 0.9 ng/mL (ref 0.3–4.0)
CK, MB: 1.2 ng/mL (ref 0.3–4.0)
Relative Index: 0.4 (ref 0.0–2.5)
Relative Index: 0.5 (ref 0.0–2.5)
Total CK: 229 U/L — ABNORMAL HIGH (ref 7–177)
Troponin I: <0.01 ng/mL (ref 0.00–0.06)
Troponin I: <0.01 ng/mL (ref 0.00–0.06)

## 2010-11-05 LAB — CBC
Platelets: 395 10*3/uL (ref 150–400)
RDW: 13 % (ref 11.5–15.5)

## 2010-11-05 LAB — LIPID PANEL
Cholesterol: 134 mg/dL (ref 0–200)
HDL: 26 mg/dL — ABNORMAL LOW (ref >39)
Triglycerides: 77 mg/dL (ref <150)

## 2010-12-09 NOTE — Group Therapy Note (Signed)
NAME:  ZAKERIA, KULZER NO.:  [id number]   MEDICAL RECORD NO.:  [id number]          PATIENT TYPE:  WOC   LOCATION:  [facility]                   FACILITY:  [facility]   PHYSICIAN:  Johnella Moloney, MD        DATE OF BIRTH:  05/25/1971   DATE OF SERVICE:                                  CLINIC NOTE   CHIEF COMPLAINT:  Mirena IUD removal and reinsertion.   HISTORY OF PRESENT ILLNESS:  The patient is a 40 year old gravida 2,  para 2 with last menstrual period of April 22, 2008 who is here for  a Mirena IUD removal and reinsertion.  The patient had her IUD placed  postpartum in September 2004.  She is very satisfied with this mode of  birth control and wants it removed today, and a new one reinserted.  She  was referred from the [facility] for this reason.  The patient does report that she has been having moderate-heavy bleeding  during her current period, but no other symptoms.   PAST OB/GYN HISTORY:  Menarche at age 33.  She does not have regular  cycles on the Mirena, but when her bleeding does occur, it only lasts  for 3 days and is medium-heavy flow.  The patient has had 1 vaginal  delivery and 1 cesarean section.  Her last Pap smear was in of August  2009 and was negative.   PAST MEDICAL HISTORY:  Hypertension.   PAST SURGICAL HISTORY:  1. Breast reduction in 2000.  2. Cesarean section in 1993.   MEDICATIONS:  1. Toprol 100 mg b.i.d.  2. Lisinopril 20 mg p.o. daily.  3. Crestor 10 mg p.o. daily.   ALLERGIES:  NO KNOWN DRUG ALLERGIES.   SOCIAL HISTORY:  The patient lives with her 2 daughters.  She is  employed.  She denies any smoking, alcohol or illicit drug use.  Denies  any current or past history of physical or sexual abuse.   FAMILY HISTORY:  Remarkable for heart disease, heart attack and blood  pressure.  No gynecologic cancers.   REVIEW OF SYSTEMS:  A 14-point comprehensive review of system was  reviewed and negative.   PHYSICAL EXAMINATION:  VITAL SIGNS:  Temperature 97.0, pulse 62, blood  pressure 133/91, respirations 24, weight 300.3 pounds, height 5 feet 4  inches.  GENERAL:  No apparent distress.  ABDOMEN:  Soft, nontender, nondistended.  PELVIC:  The patient was noted to have about a 9-10 week size,  retroverted uterus,  nontender uterus.  Adnexa not palpated secondary to habitus, but  nontender.   PROCEDURE:  The patient was consented for Mirena IUD removal and  reinsertion.  The risks of Mirena IUD including irregular bleeding,  cramping and the risks of procedure including bleeding, infection,  injury to uterus or surrounding organs and increased risk of expulsion  with heavy menstrual flow were discussed with the patient, and consent  was obtained.  On visualization of the cervix, IUD strings were not  seen.  An attempt was made to see if they could be brought out of the  cervical canal using  Cytobrush, but this was not successful.  The cervix  was then swabbed with iodine and a long Kelly forcep was used to reach  into the lower uterine segment where the Mirena IUD was encountered,  grasped with a Kelly forceps and then removed intact.  Cervix was re-  swabbed with Betadine and a tenaculum was applied to the anterior lip of  the cervix.  The uterus was sounded to 10 cm and the Mirena IUD was  inserted without difficulty.  The IUD strings were cut to about 3 cm  outside the cervical os.  There was minimal bleeding at the end of  procedure.  The patient tolerated the procedure well.  She was told that  the IUD is due for removal April 26, 2013.  She will come back in 4-6  weeks for IUD surveillance.   ASSESSMENT/PLAN:  The patient is a 40 year old gravida 2, para 2, now  status post Mirena intrauterine device removal and reinsertion.  The  patient had an uncomplicated procedure.  She will come back in 4-6 weeks  for IUD check.  The patient was told that she can call or come back in,  or  followup at the [facility] for any further  gynecologic concerns.           ______________________________  Johnella Moloney, MD     UD/MEDQ  D:  04/26/2008  T:  04/27/2008  Job:  [id number]

## 2010-12-09 NOTE — Discharge Summary (Signed)
NAME:  Danielle Shaw, Danielle Shaw NO.:  [id number]   MEDICAL RECORD NO.:  [id number]           PATIENT TYPE:   LOCATION:                                 FACILITY:   PHYSICIAN:  Leighton Roach McDiarmid, M.D.DATE OF BIRTH:  04-22-1971   DATE OF ADMISSION:  DATE OF DISCHARGE:                               DISCHARGE SUMMARY   PRIMARY CARE PHYSICIAN:  Luretha Murphy at [facility].   DISCHARGE DIAGNOSES:  1. Atypical chest pain, likely musculoskeletal versus gastrointestinal      in nature.  2. Hypertension.  3. Hyperlipidemia.   DISCHARGE MEDICATIONS:  1. Aspirin 81 mg by mouth daily until followed up with outpatient      stress test.  2. Lisinopril 20 mg.  3. Hydrochlorothiazide 12.5 mg by mouth daily, hold.  4. Metoprolol 100 mg by mouth twice daily, hold.  5. Crestor 20 mg by mouth daily.  6. Percocet 5/325 take 1 by mouth every 4-6 hours as needed for pain,      #20 with no refills.  7. Phenergan 12.5 mg by mouth every 4-6 hours as needed for nausea,      #20, no refills.   STUDIES:  Chest x-ray on November 01, 2008, impression:  Cardiomegaly.  Question of mild vascular congestion.   LABORATORY DATA:  White blood cell count 3.8, hemoglobin 14.0,  hematocrit 40.1, and platelets 395.  Sodium 137, potassium 3.5, chloride  106, CO2 of 25, glucose 100, BUN 10, creatinine 0.50, alk phos 101,  total bilirubin 0.8, AST 19, ALT 15, total protein 8.0, albumin 3.7, and  calcium 9.2.  D-dimer was 0.22.  Cardiac enzymes negative x2.  Cholesterol 134, triglyceride 77, HDL 26, and LDL 93.   BRIEF HOSPITAL COURSE:  Ms. Danielle Shaw is a pleasant 40 year old  African American female that came in with atypical chest pain for an  rule out.  1. Chest pain, described as midsternal, radiating to left arm,      stabbing, constant that started about a week ago and is worsened      over the last 3 days.  It was relieved with morphine and not      relieved with a GI  cocktail yesterday.  Associated signs and      symptoms included nausea, no vomiting, no coughing, no shortness of      breath, positive epigastric pain with deep inspiration, positive      sour taste in mouth, and positive panic attacks in the past and      increased stress lately at work and school as well as tenderness to      palpation along the left pectoral.  The patient was admitted for      observation on tele overnight.  Cardiac enzymes were cycled that      were negative x2.  The patient came in with an EKG from [facility] that showed nonspecific T-wave changes in V1-C6.      This was repeated the next morning and was unchanged.  The patient  had a chest x-ray on November 01, 2008, that showed cardiomegaly with      mild vascular congestion.  The patient's case was discussed and      although she has risk factors including hypertension,      hyperlipidemia, and family history of her mother dying at age 39      from acute MI, she was still found to be at low risk and that her      chest pain was atypical and reproducible.  We are also reassured      with a negative cardiac enzymes and an unchanged EKG.  The patient      was discharged with her previous medications as well as baby      aspirin and will undergo a stress test on an outpatient basis.      Pulmonary embolus was ruled out.  D-dimer was 0.22 which has high      negative predictive value in this patient.  The patient also is      noted to have a history of GERD and taking no medication prior to      hospitalization.  Although her epigastric pain was not relieved      with GI cocktail, we are going to send her home on Protonix for      possible gastritis pain.  The patient also admitted that she has      had several panic attacks in the past and that she is under      increased stress for the last few days and weeks secondary to work,      school, and her children.  I suggested that this may contribute  to      her anxiety and chest pain.  It maybe should be addressed by her      primary care doctor.  2. Hypertension.  The patient usually takes lisinopril 20 mg,      hydrochlorothiazide 12.5 mg by mouth daily, and metoprolol 100 mg      by mouth twice daily.  She says that on these medicines her      systolic blood pressures are usually in the 130s.  It was decreased      during her hospitalization.  She was given morphine and      nitroglycerin.  Upon discharge, her systolics are in the upper      100s.  She was asked to hold her blood pressure medicines and to      check her blood pressure over the weekend.  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  She was instructed to restart her      lisinopril and hydrochlorothiazide if her blood pressure readings      are 140/90.  She was told to hold the metoprolol until she sees      Luretha Murphy as she did experience some bradycardia during her stay      after morphine and nitroglycerin administration.  3. Hyperlipidemia.  The patient did get a repeat fasting lipid panel      during her stay that was within normal limits with the exception of      a low HDL and she was encouraged to exercise.   FOLLOWUP ISSUES:  1. The patient has instructions to follow up with Luretha Murphy at the      [facility] on November 06, 2008, at 11:15 p.m.  2. The patient was  instructed to hold her blood pressure medicines for      now and this would be reevaluated by her PCP.  3. Outpatient stress test.  4. The patient underwent an echo prior to discharge and this will be      followed up by her PCP.  5. It is suggested that we address the patient's psychosocial issues      and possibly start medicine for depression.      Helane Rima, MD  Electronically Signed      Leighton Roach McDiarmid, M.D.  Electronically Signed    EW/MEDQ  D:  11/02/2008  T:  11/03/2008  Job:  [id number]

## 2010-12-12 NOTE — Op Note (Signed)
   NAME:  Danielle Shaw, Danielle Shaw                        ACCOUNT NO.:  [id number]   MEDICAL RECORD NO.:  [id number]                   PATIENT TYPE:  INP   LOCATION:  9144                                 [facility]ACILITY[facility]   PHYSICIAN:  Georgina Peer, M.D.              DATE OF BIRTH:  04/18/1971   DATE OF PROCEDURE:  02/24/2003  DATE OF DISCHARGE:                                 OPERATIVE REPORT   INDICATIONS FOR OPERATIVE DELIVERY:  Fetal bradycardia in the third stage;  previous C-section; previous myomectomy; morbidly obese female.   POST-DELIVERY DIAGNOSIS:  Viable female infant delivered at 1546, Apgars of  9 and 9.   OBSTETRICIAN:  Georgina Peer, M.D.   INDICATIONS:  This is a 40 year old gravida 2, para 1, EDC February 22, 2003, at  40-2/7 weeks, who presented in active labor.  She was a previous C-section  for fetal distress and was a VBAC candidate because of a low transverse  incision.  She progressed to complete dilatation at 1526, had deep variable  decelerations with contractions, pushed the vertex to +3.  Delivery was  expedited due to the decelerations.  Risks and complications of vacuum  extraction including infection of the scalp or cranium, laceration or  bleeding of the scalp or cranium and accentuation of the caput discussed.   DESCRIPTION OF PROCEDURE:  The patient was prepped and draped in normal  sterile fashion.  Vertex was at a +3 OA.  The Kiwi vacuum was applied with  pushing and in one contraction, delivery was effected with no pop-offs.  The  delivery was at 1546.  Mouth and nose were suctioned.  No nuchal cord was  noted.  Infant cried spontaneously, had Apgars of 9 and 9.  Cord was doubly  clamped and cut.  The arterial vessels quickly became small and cord pH was  not attainable.  Cord blood was obtained.  There was a first degree midline  perineal laceration and bilateral periurethral lacerations which were all  repaired with Vicryl, Rapide and chromic.  Blood  loss was less than 500 mL.  The patient tolerated the procedure well.  Infant went to the regular  nursery.  There was no evidence of lower uterine scar separation.  The  patient had labile blood pressures during labor and this will be watched  postpartum.                                               Georgina Peer, M.D.    JPN/MEDQ  D:  02/24/2003  T:  02/24/2003  Job:  [id number]   cc:   Fayrene Fearing A. Ashley Royalty, M.D.  [address]  [place], [place] [address]  Fax: [phone]

## 2010-12-12 NOTE — Discharge Summary (Signed)
NAME:  ADREAM, PARZYCH              ACCOUNT NO.:  [id number]   MEDICAL RECORD NO.:  [id number]          PATIENT TYPE:  INP   LOCATION:  2012                         FACILITY:  [facility]   PHYSICIAN:  Rolm Gala, M.D.    DATE OF BIRTH:  [birth year]   DATE OF ADMISSION:  05/28/2005  DATE OF DISCHARGE:  05/29/2005                                 DISCHARGE SUMMARY   FINAL DIAGNOSES:  1.  Noncardiac chest pain.  2.  Migraines.  3.  Hyperlipidemia.  4.  Hypertension.  5.  Obesity.   PRINCIPLE PROCEDURE:  None.   LABORATORY DATA:  CK-MB and troponin I are normal x3.  Cholesterol 250,  triglycerides 155, HDL 38, LDL 181.  AST 14, ALT 18, TSH 0.754.   EKG:  Nonspecific wave changes.   HOSPITAL COURSE:  This is a 40 year old female with a history of  hypertension, hyperlipidemia, obesity, family history of MI before age 74,  who presented with a one week history of intermittent chest pain that  occurred at rest.  The chest pain did not change with exertion.   Problem 1:  Chest pain:  The chest pain was atypical; however, the patient  had multiple risk factors with a strong family history of early MI.  Etiology was thought to be cardiac ischemia versus GERD versus panic attack.  Ms. Mcneice had negative cardiac enzymes x3 and an EKG that did not show  any ST changes.  Restarted Ms. Weil on Protonix and will continue it as  an outpatient, as this may likely be the cause.   Problem 2:  Migraines:  Ms. Bark has a history of migraines and usually  takes Excedrin Migraine for this at home.  Here she was given Midrin.   Problem 3:  Hyperlipidemia:  Ms. Parekh has a history of hyperlipidemia  and was on Lipitor 40.  On admission, her LDL was 180, which is greater than  her goal, so we increased her Lipitor to 80 p.o. q.h.s.   Problem 4:  Hypertension:  Ms. Mcenery is continued on her home dose of  metoprolol and nifedipine.   Problem 5:  Obesity.   DISCHARGE MEDICATIONS:  1.   Lipitor 80 1 p.o. q.h.s.  2.  Metoprolol 50 mg p.o. b.i.d.  3.  Nifedipine ER 30 mg 1 p.o. daily.  4.  Protonix 40 mg p.o. b.i.d.   Patient was a full code during her entire hospital stay.      Rolm Gala, M.D.     Bennetta Laos  D:  05/29/2005  T:  05/29/2005  Job:  [id number]

## 2010-12-12 NOTE — H&P (Signed)
NAME:  Danielle Shaw, Danielle Shaw NO.:  [id number]   MEDICAL RECORD NO.:  [id number]          PATIENT TYPE:  EMS   LOCATION:  [facility]                         FACILITY:  [facility]   PHYSICIAN:  Kerby Nora, MD        DATE OF BIRTH:  1970-08-19   DATE OF ADMISSION:  05/28/2005  DATE OF DISCHARGE:                                HISTORY & PHYSICAL   PRIMARY CARE PHYSICIAN:  Madeleine B. Erenest Rasher, M.D.   CHIEF COMPLAINT:  Chest pain.   HISTORY OF PRESENT ILLNESS:  Ms. Danielle Shaw is a 40 year old female  with history of hypertension, hyperlipidemia, obesity, and family history of  MI before age 25, who presented to [facility] with 1  week of intermittent central chest pain.  The chest pain occurred at rest,  had no change with exertion, lying flat, or eating.  The chest pain was  intermittent, but did last approximately 6 hours yesterday.  The pain came  on while she was stressed out at work during a meeting.  It was associated  with nausea, no vomiting, no diuresis, and no shortness of breath.  She did  comment on palpitations during the chest pain.   REVIEW OF SYSTEMS:  No fever, chills.  No shortness of breath, no abdominal  pain, no diarrhea, no constipation, no dysuria, no rash, no depression, and  no anxiety.   PAST MEDICAL HISTORY:  1.  Hypertension.  2.  Obesity.  3.  Hyperlipidemia.  4.  Migraine.  5.  History of fatigue and malaise.  6.  Dysfunctional uterine bleeding.   MEDICATIONS:  1.  Lipitor 40 mg p.o. nightly.  2.  Metoprolol 50 mg p.o. b.i.d.  3.  Nifedipine ER 30 mg p.o. daily.   ALLERGIES:  HYDROCHLOROTHIAZIDE made her feel like she was on drugs.  ATENOLOL possibly caused alopecia.   FAMILY HISTORY:  Mother deceased at age 70 of massive MI, mother had first  MI at age 7.  Father living and healthy.  Maternal uncle died at age 44  from acute MI.  Brother with arrhythmia.  Sister healthy.  The patient has  two daughters.   Paternal aunt also has hypertension and coronary artery  disease and had an MI at less than 42 years of age.   SOCIAL HISTORY:  Ms. Danielle Shaw is single, lives with her two daughters.  She  is currently employed.  She denies tobacco use, alcohol, illicit drugs.   PHYSICAL EXAMINATION:  VITAL SIGNS:  Temperature 98.2, blood pressure  130/88, weight 299, heart rate 66, respiratory rate 18.  GENERAL:  No apparent distress, obese.  NEUROLOGY:  Cranial nerves II-XII grossly intact.  Alert and oriented x3.  HEENT:  PERRLA.  Extraocular muscles intact.  Tympanic membranes clear.  Oropharynx clear.  No lymphadenopathy, no thyromegaly.  LUNGS:  Clear to auscultation bilaterally.  No wheezes, rales, or rhonchi.  HEART:  Regular rate and rhythm with no murmurs, rubs, or gallops.  Pulses  2+ bilateral upper and lower extremities.  ABDOMEN:  Soft, nontender, and normal active bowel sounds.  EXTREMITIES:  No peripheral edema.  SKIN:  No lesion.   TESTS:  EKG showed T wave inversion in V2 through V5 which is a new change  from 2004.  There is no ST elevation or decrease, no Q waves.  The patient  was bradycardic at 58.   The patient's last lab tests from May of 2006; LDL was 146, HDL 44,  triglycerides 98, AST 14, ALT 18, TSH 0.754.   ASSESSMENT:  A 40 year old female with:  1.  Chest pain, atypical.  The patient has multiple risk factors and strong      family history of early myocardial infarction.  The etiology of the      chest pain could be from cardiac ischemia versus GERD versus panic      attack.  Given her high level of risk factors and the EKG changes, I      will admit her for 23-hour observation for rule out MI on telemetry.  We      will start a PPI for possible gastroesophageal reflux disease.  We will      optimize treatment of cardiac risk factors such as getting an a.m.      fasting blood sugar to evaluate for diabetes, checking a fasting lipid      panel for consideration of  increasing Lipitor for her dyslipidemia.  I      will also check a CBC to evaluate for anemia as etiology of chest      heaviness and palpitations.  I will treat her with beta blocker,      aspirin, and oxygen initially for possible ischemia.  Of note, she was      given an aspirin clinically before being transferred over to [facility].  The patient will likely need outpatient stress tests before      cardiac evaluation.  2.  FEN.  Hep-lock.  Check CMET.  3.  Health maintenance.  Flu vaccine.      Kerby Nora, MD     AB/MEDQ  D:  05/28/2005  T:  05/28/2005  Job:  [id number]   cc:   Tawnya Crook Erenest Rasher, M.D.  Fax: [phone]

## 2010-12-12 NOTE — Group Therapy Note (Signed)
NAME:  Danielle Shaw, Danielle Shaw NO.:  [id number]   MEDICAL RECORD NO.:  [id number]                   PATIENT TYPE:  OUT   LOCATION:  [facility]                           FACILITY:  [facility]   PHYSICIAN:  Argentina Donovan, MD                     DATE OF BIRTH:  12/01/1970   DATE OF SERVICE:  03/11/2004                                    CLINIC NOTE   CHIEF COMPLAINT:  Referral for polycystic ovarian syndrome.   HISTORY OF PRESENT ILLNESS:  Danielle Shaw is a 40 year old G2 P2 African-  American female who has recently been seen at dermatology for hair loss.  In  the process of her workup was found to have an increased free testosterone  level of 7.8 pg/mL; normal values are 0.6-6.8.  This was followed by [facility].  She was felt to need further workup for PCOS.  LMP March 05, 2004.  Menses at age 14.  Has been on birth control including Depo-  Provera, OCPs, and currently a Mirena IUD.  Denies any hirsutism.  Does have  difficulty losing weight.   PAST MEDICAL HISTORY:  Significant for hypertension.  Denies allergies.   MEDICATIONS:  Toprol-XL, ibuprofen, and Naproxen.   OBJECTIVE:  VITAL SIGNS:  Noted and stable in chart.  CARDIOVASCULAR:  RRR.  LUNGS:  CTAB.  GENITOURINARY:  Normal external genitalia.  Bimanual exam:  Normal size,  nontender.  Uterus:  No adnexal masses.  Mirena in place.   ASSESSMENT AND PLAN:  Likely polycystic ovarian syndrome.  In this patient  hormones likely under good control with hormone therapy, currently IUD -  Mirena, but for the past 16 years therefore no problems with irregular  menstrual cycles or hirsutism, amenorrhea.  The biggest concern today will  be the patient's insulin resistance.  Therefore will check a CBC, hemoglobin  A1c, and a 2-hour glucose tolerance test.  If these levels are abnormal will  refer to her primary care physician to treat for diabetes.  If are within  normal limits will send the patient if  early glucose intolerance with her  PCOS and will follow up in this clinic and likely prescribe metformin.  Will  need to call the patient with results and appropriate follow-up.  With low  level of increase in free testosterone is unlikely she does have an androgen-  secreting tumor and therefore will not obtain a 17-hydroxyprogesterone or  FSH/LH levels which are likely controlled by her IUD.     Ace Gins, MD                        Argentina Donovan, MD    JS/MEDQ  D:  03/11/2004  T:  03/12/2004  Job:  [id number]

## 2011-05-06 ENCOUNTER — Other Ambulatory Visit: Payer: Self-pay | Admitting: Family Medicine

## 2011-05-06 ENCOUNTER — Encounter: Payer: Self-pay | Admitting: Family Medicine

## 2011-05-06 ENCOUNTER — Ambulatory Visit (HOSPITAL_COMMUNITY)
Admission: RE | Admit: 2011-05-06 | Discharge: 2011-05-06 | Disposition: A | Discharge status: Home / Self Care | Payer: Medicaid Other | Source: Ambulatory Visit | Attending: Family Medicine | Admitting: Family Medicine

## 2011-05-06 ENCOUNTER — Other Ambulatory Visit: Payer: Self-pay

## 2011-05-06 ENCOUNTER — Encounter: Payer: Self-pay | Admitting: Internal Medicine

## 2011-05-06 ENCOUNTER — Ambulatory Visit (INDEPENDENT_AMBULATORY_CARE_PROVIDER_SITE_OTHER): Payer: Medicaid Other | Admitting: Family Medicine

## 2011-05-06 DIAGNOSIS — Z309 Encounter for contraceptive management, unspecified: Secondary | ICD-10-CM

## 2011-05-06 DIAGNOSIS — R002 Palpitations: Secondary | ICD-10-CM

## 2011-05-06 DIAGNOSIS — I10 Essential (primary) hypertension: Secondary | ICD-10-CM

## 2011-05-06 DIAGNOSIS — Z975 Presence of (intrauterine) contraceptive device: Secondary | ICD-10-CM

## 2011-05-06 DIAGNOSIS — E785 Hyperlipidemia, unspecified: Secondary | ICD-10-CM

## 2011-05-06 DIAGNOSIS — I517 Cardiomegaly: Secondary | ICD-10-CM | POA: Insufficient documentation

## 2011-05-06 DIAGNOSIS — IMO0001 Reserved for inherently not codable concepts without codable children: Secondary | ICD-10-CM

## 2011-05-06 DIAGNOSIS — E669 Obesity, unspecified: Secondary | ICD-10-CM

## 2011-05-06 DIAGNOSIS — Z202 Contact with and (suspected) exposure to infections with a predominantly sexual mode of transmission: Secondary | ICD-10-CM

## 2011-05-06 LAB — COMPREHENSIVE METABOLIC PANEL
ALT: 13 U/L (ref 0–35)
BUN: 11 mg/dL (ref 6–23)
CO2: 25 mEq/L (ref 19–32)
Calcium: 9.1 mg/dL (ref 8.4–10.5)
Creat: 0.74 mg/dL (ref 0.50–1.10)
Total Bilirubin: 0.4 mg/dL (ref 0.3–1.2)

## 2011-05-06 LAB — LIPID PANEL
Cholesterol: 285 mg/dL — ABNORMAL HIGH (ref 0–200)
HDL: 41 mg/dL (ref >39)
Total CHOL/HDL Ratio: 7 Ratio
Triglycerides: 135 mg/dL (ref <150)
VLDL: 27 mg/dL (ref 0–40)

## 2011-05-06 MED ORDER — LOSARTAN POTASSIUM 25 MG PO TABS: 25.0000 mg | ORAL_TABLET | Freq: Every day | ORAL | Status: DC

## 2011-05-06 NOTE — Patient Instructions (Signed)
It was good to meet you today  I am referring you to the cardiologist for your palpitations.  I am also doing some blood work today I am starting you on a new blood pressure medicine. Uses as prescribed. If you develops any severe chest pain shortness of breath or any other concerning symptoms go to the emergency room Follow up with her regular doctor to 12 months

## 2011-05-07 LAB — CBC
HCT: 41.7 % (ref 36.0–46.0)
MCH: 29.6 pg (ref 26.0–34.0)
MCV: 88.9 fL (ref 78.0–100.0)
Platelets: 389 10*3/uL (ref 150–400)
RBC: 4.69 MIL/uL (ref 3.87–5.11)

## 2011-05-07 LAB — HIV ANTIBODY (ROUTINE TESTING W REFLEX): HIV: NONREACTIVE

## 2011-05-08 ENCOUNTER — Ambulatory Visit (INDEPENDENT_AMBULATORY_CARE_PROVIDER_SITE_OTHER): Payer: Medicaid Other | Admitting: Internal Medicine

## 2011-05-08 ENCOUNTER — Encounter: Payer: Self-pay | Admitting: Internal Medicine

## 2011-05-08 VITALS — BP 156/106 | HR 81 | Ht 65.0 in | Wt 314.0 lb

## 2011-05-08 DIAGNOSIS — I10 Essential (primary) hypertension: Secondary | ICD-10-CM

## 2011-05-08 DIAGNOSIS — R002 Palpitations: Secondary | ICD-10-CM

## 2011-05-08 DIAGNOSIS — E785 Hyperlipidemia, unspecified: Secondary | ICD-10-CM

## 2011-05-08 NOTE — Progress Notes (Signed)
HPIPateint is a 40 year old who is referred for evaluation of palpitations. Patient says that some days are better than others.   A couple times per month feels chest pains at rest.  Goes away on own With walking does get some SOB.  Takes time Spells of feeling heart racing when gets up to do things.  Comes not every day but often  Changes in position.  Occasionally with standing No syncope.  A little light headed at times. Twice last week felt light headed like going to fall  Had cough even after stopped lisinopril .  Put on amlodipine/benazpril.  Still cough. Off x 2 wks.  Still with cough Not a lot of allergies.  Allergies  Allergen Reactions  . Latex     Current Outpatient Prescriptions  Medication Sig Dispense Refill  . aspirin 81 MG tablet Take 81 mg by mouth daily.       . famotidine (PEPCID) 40 MG tablet Take 40 mg by mouth at bedtime.       [name] losartan (COZAAR) 25 MG tablet Take 1 tablet (25 mg total) by mouth daily.  30 tablet  11  . omeprazole (PRILOSEC) 20 MG capsule Take 20 mg by mouth daily.        . Polyethylene Glycol 3350 POWD 17 GM in 4 oz water two times a day for one week then daily, QS       . rosuvastatin (CRESTOR) 20 MG tablet Take 20 mg by mouth at bedtime. (will need PA, no reduction on simvastatin)      . traMADol (ULTRAM) 50 MG tablet One to two by mouth every 4-6 hours for cough or pain        Past Medical History  Diagnosis Date  . IUD 2004  . IUD 2009  . Hypertension   . Arrhythmia     No past surgical history on file.  Family History  Problem Relation Age of Onset  . Heart disease Mother   . Diabetes Father     History   Social History  . Marital Status: Single    Spouse Name: N/A    Number of Children: N/A  . Years of Education: N/A   Occupational History  . Not on file.   Social History Main Topics  . Smoking status: Never Smoker   . Smokeless tobacco: Not on file  . Alcohol Use: Not on file  . Drug Use: Not on file  .  Sexually Active: Not on file   Other Topics Concern  . Not on file   Social History Narrative  . No narrative on file    Review of Systems:  All systems reviewed.  They are negative to the above problem except as previously stated.  Vital Signs: BP 138/94  Pulse 67  Ht 5\' 5"  (1.651 m)  Wt 314 lb (142.429 kg)  BMI 52.25 kg/m2  Physical Exam  Patient is in NAD HEENT:  Normocephalic, atraumatic. EOMI, PERRLA.  Neck: JVP is normal. No thyromegaly. No bruits.  Lungs: clear to auscultation. No rales no wheezes.  Heart: Regular rate and rhythm. Normal S1, S2. No S3.   No significant murmurs. PMI not displaced.  Abdomen:  Supple, nontender. Normal bowel sounds. No masses. No hepatomegaly.  Extremities:   Good distal pulses throughout. No lower extremity edema.  Musculoskeletal :moving all extremities.  Neuro:   alert and oriented x3.  CN II-XII grossly intact.  EKG:  Sinus rhythm  67.  T wave inversion  V1 to V5   Assessment and Plan:

## 2011-05-08 NOTE — Patient Instructions (Signed)
Your physician has recommended that you wear an event monitor. Event monitors are medical devices that record the heart's electrical activity. Doctors most often us these monitors to diagnose arrhythmias. Arrhythmias are problems with the speed or rhythm of the heartbeat. The monitor is a small, portable device. You can wear one while you do your normal daily activities. This is usually used to diagnose what is causing palpitations/syncope (passing out).   Your physician has requested that you have an echocardiogram. Echocardiography is a painless test that uses sound waves to create images of your heart. It provides your doctor with information about the size and shape of your heart and how well your heart's chambers and valves are working. This procedure takes approximately one hour. There are no restrictions for this procedure.   

## 2011-05-11 ENCOUNTER — Encounter: Payer: Self-pay | Admitting: Family Medicine

## 2011-05-11 ENCOUNTER — Ambulatory Visit (HOSPITAL_COMMUNITY): Payer: Medicaid Other

## 2011-05-11 DIAGNOSIS — R002 Palpitations: Secondary | ICD-10-CM | POA: Insufficient documentation

## 2011-05-11 NOTE — Assessment & Plan Note (Addendum)
EKG in clinic today with NSR. Given baseline RFs (obesity, HTN, HLD) as well as family hx of early heart disease in females, there is concern for cardiac source of palpitation feeling. Will refer to cardiology to better assess. Pt may benefit from stress testing.  In setting of obesity, will obtain general risk stratification labs.

## 2011-05-11 NOTE — Assessment & Plan Note (Addendum)
Strings minimally visualized. Will obtain confirmatory ultrasound to assess placement of IUD.  Pap due in 2014.

## 2011-05-11 NOTE — Assessment & Plan Note (Signed)
Recheking FLP

## 2011-05-11 NOTE — Progress Notes (Signed)
  Subjective:    Patient ID: Danielle Shaw, female    DOB: 10/09/1970, 40 y.o.   MRN: [id number]  HPI Pt is here for general annual exam. Pt was unable to schedule annual exam with new PCP Dr. Elwyn Reach. Palpitaitons: Pt reports intermittent palpitations over the last 1-2 months. These palpitations are sometimes associated with chest pain. Pt with intermittent dyspnea as well. Pt denies any stimulant use. Pt does report family hx/o heart disease with mother and grandmother dying in there 43s because of heart disease.   HTN: Pt not checking BPs daily. Pt does report high salt intake. No HA. Has had intermittent CP that is not associated with exertion. Mild SOB with exertion. Pt currently on norvasc. Pt states she  not been taking.   IUD: pt states that she has had an IUD for last 1-2 years for contraception. Pt states that she usually is able to feel the strings on self check, however she has not been able to feel strings for last 1-2 months. No abd pain. No breakthrough vaginal bleeding.  Review of Systems See HPI     Objective:   Physical Exam Gen: up in chair, NAD, obese HEENT: NCAT, EOMI, TMs clear bilaterally CV: RRR, no murmurs auscultated PULM: CTAB, no wheezes, rales, rhoncii ABD: S/NT/+ bowel sounds  GU: normal external genitalia, no vaginal discharge, iud strings minimally visualized at cervical os EXT: 2+ peripheral pulses   Assessment & Plan:

## 2011-05-11 NOTE — Assessment & Plan Note (Addendum)
Elevated today. Will restart on losartan at increased dose. Previous hx/o ACE intolerance 2/2 to cough. Will check Cr and K.

## 2011-05-12 ENCOUNTER — Ambulatory Visit (HOSPITAL_COMMUNITY)
Admission: RE | Admit: 2011-05-12 | Discharge: 2011-05-12 | Disposition: A | Discharge status: Home / Self Care | Payer: Medicaid Other | Source: Ambulatory Visit | Attending: Family Medicine | Admitting: Family Medicine

## 2011-05-12 DIAGNOSIS — Z30431 Encounter for routine checking of intrauterine contraceptive device: Secondary | ICD-10-CM | POA: Insufficient documentation

## 2011-05-12 DIAGNOSIS — Z309 Encounter for contraceptive management, unspecified: Secondary | ICD-10-CM

## 2011-05-12 DIAGNOSIS — D259 Leiomyoma of uterus, unspecified: Secondary | ICD-10-CM | POA: Insufficient documentation

## 2011-05-12 IMAGING — US US PELVIS COMPLETE
1 series · 14 of 25 positions shown · non-contrast
Comparison: [DATE]

CLINICAL DATA: The IUD strings are poorly visualized

TRANSABDOMINAL AND TRANSVAGINAL ULTRASOUND OF PELVIS
TECHNIQUE: Both transabdominal and transvaginal ultrasound
examinations of the pelvis were performed. Transabdominal technique
was performed for global imaging of the pelvis including uterus,
ovaries, adnexal regions, and pelvic cul-de-sac.

[Series 1: us pelvis complete · 14 of 81 slices shown]
[im 1/81]
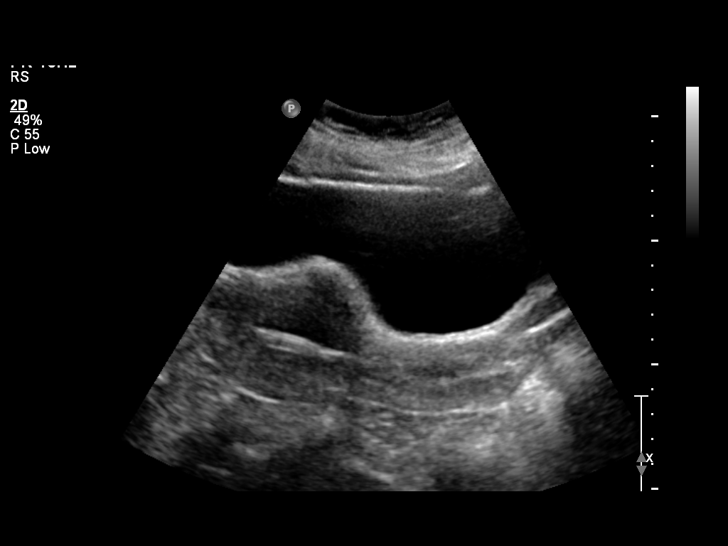
[im 7/81]
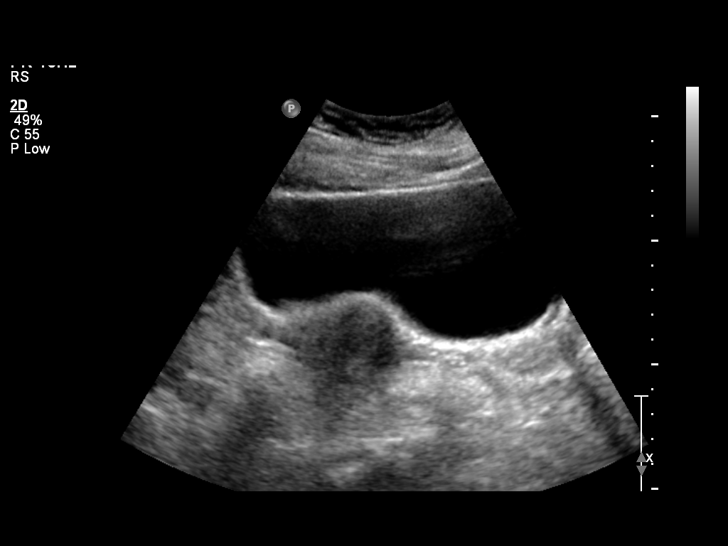
[im 14/81]
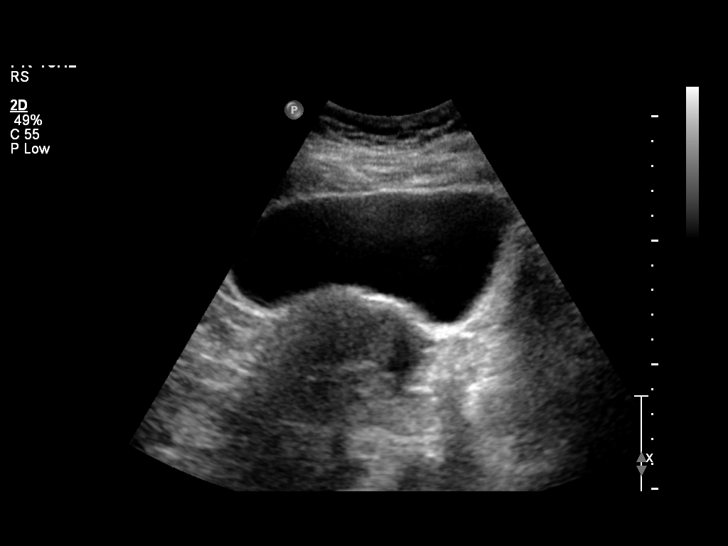
[im 21/81]
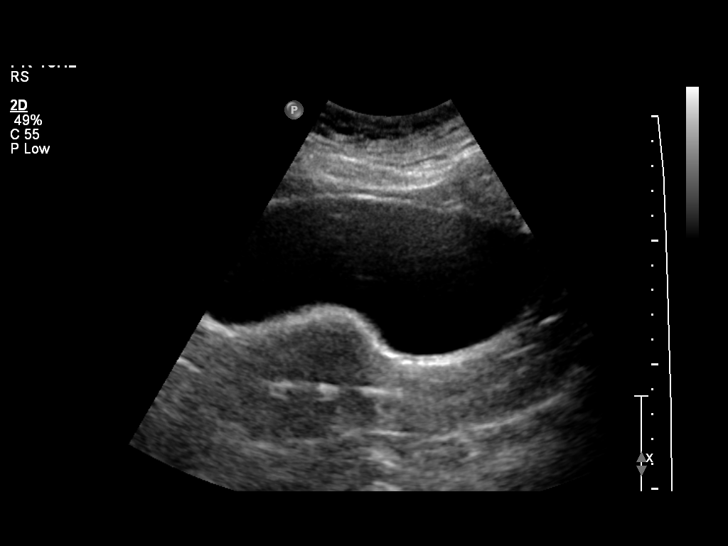
[im 27/81]
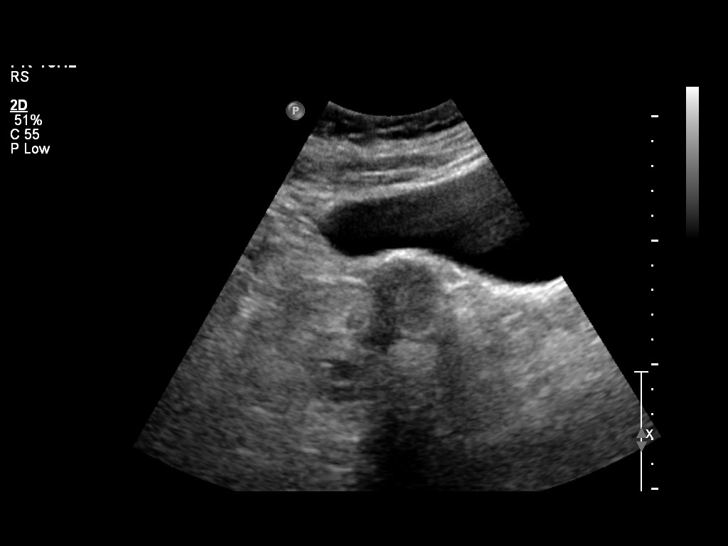
[im 31/81]
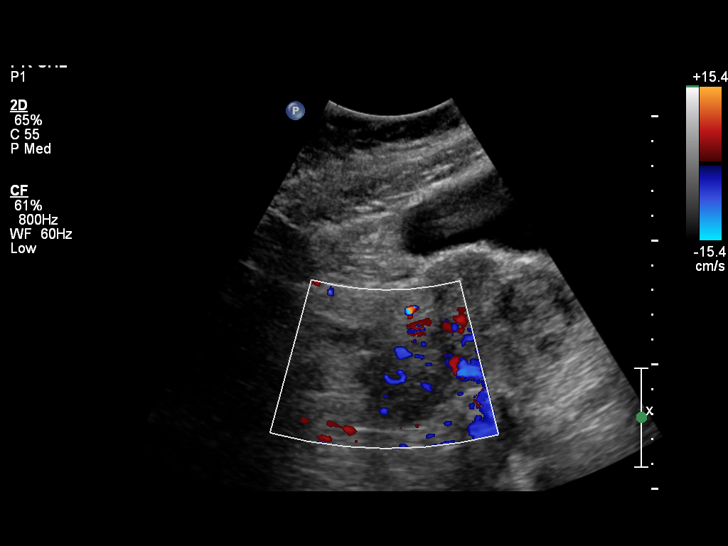
[im 37/81]
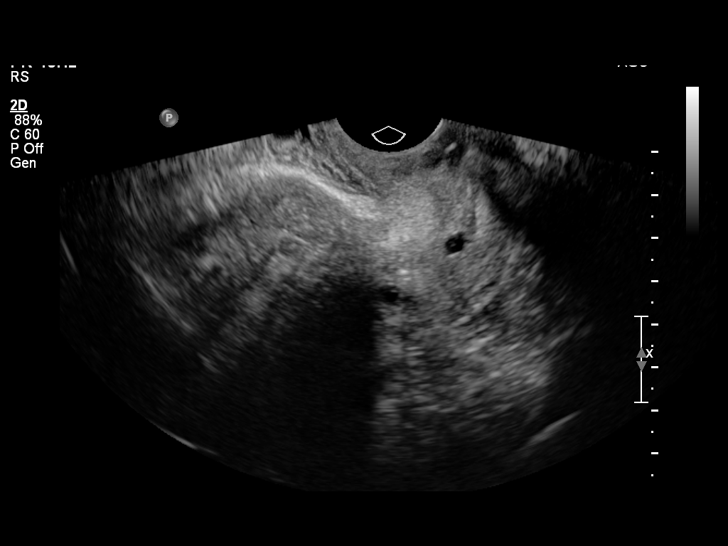
[im 44/81]
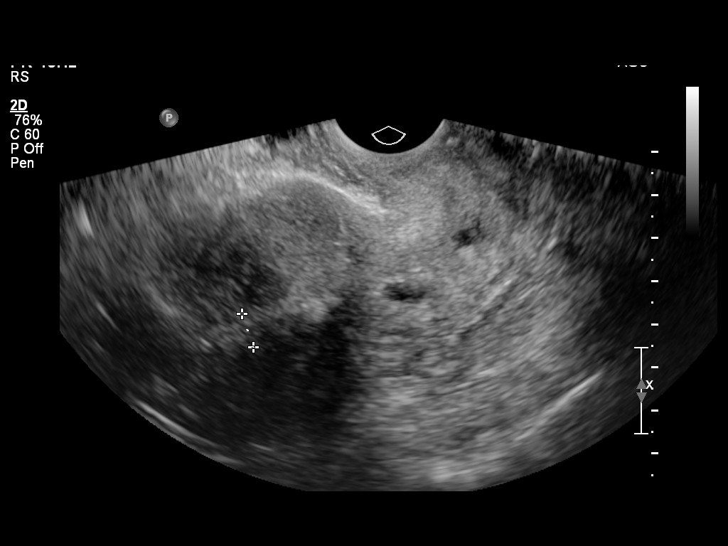
[im 51/81]
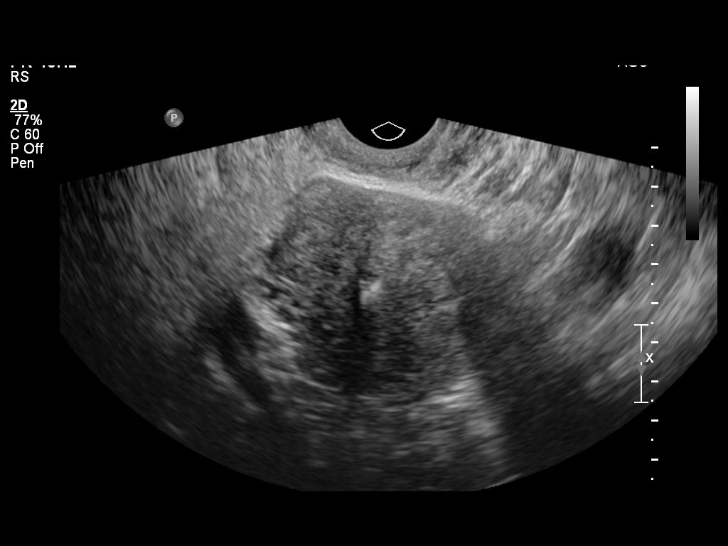
[im 54/81]
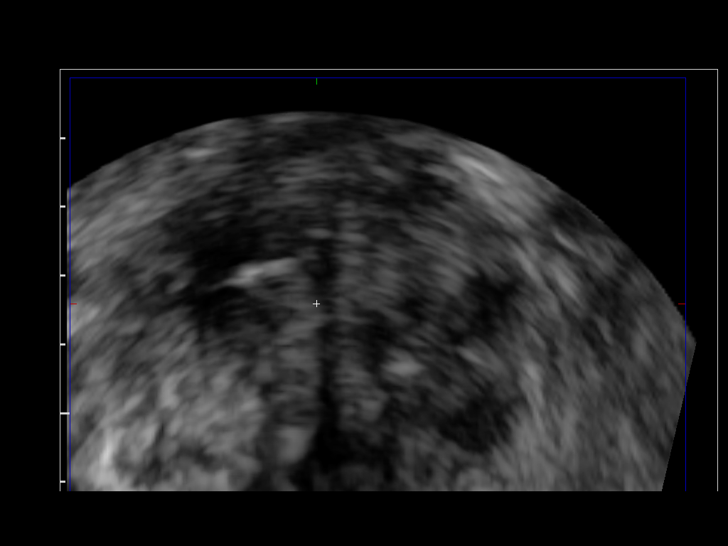
[im 61/81]
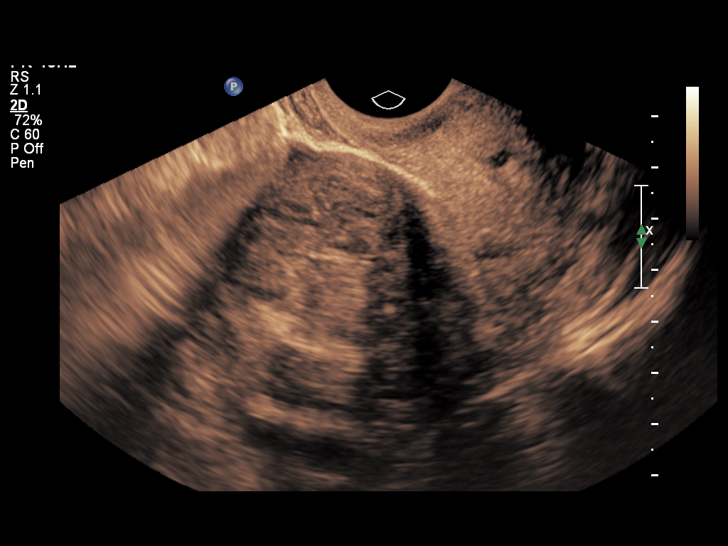
[im 67/81]
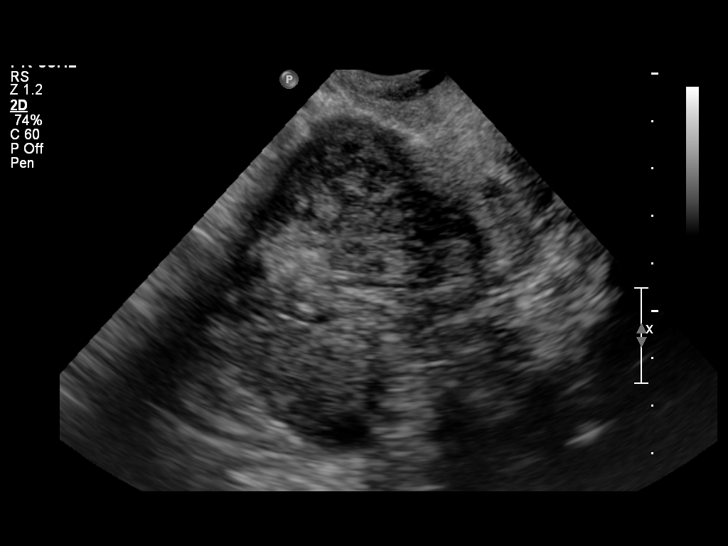
[im 74/81]
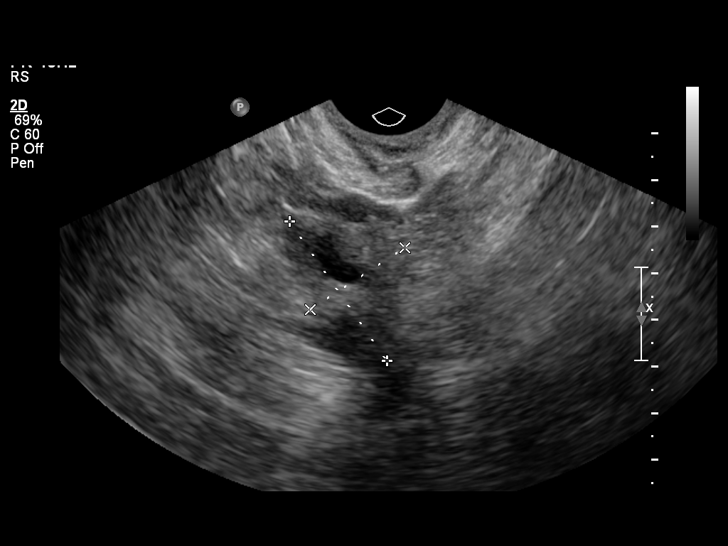
[im 81/81]
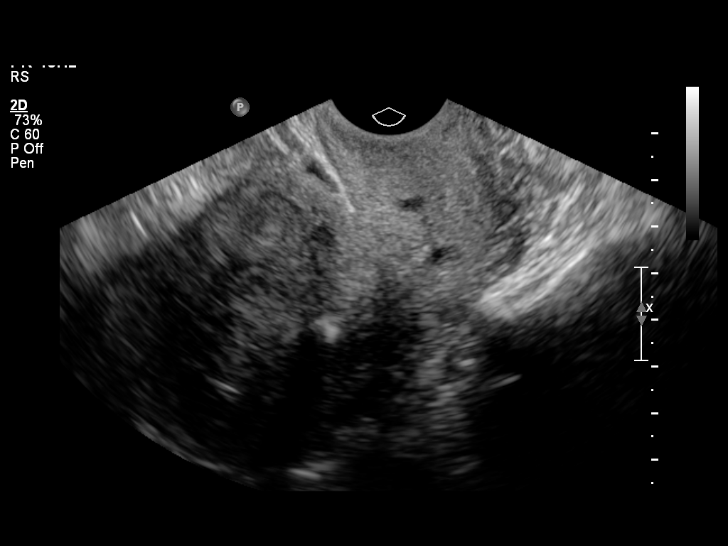

[14 of 25 positions shown; findings below may reference images not displayed]

It was necessary to proceed with endovaginal exam following the
transabdominal exam to visualize the endometrium.
FINDINGS: The uterus is mildly enlarged and lobular in contour,
measuring 12.7 x 5.9 x 6.1 cm.  There are myometrial fibroids at
the fundus which measure 3.4 cm on the left and 4.1 cm on the
right.  The IUD is within the endometrial cavity at the fundus.
The endometrial stripe is within normal limits, measuring 9 mm.

Both ovaries have a normal size and appearance.  The right ovary
measures 3.0 x 4.7 x 3.0 cm, and the left ovary measures 5.2 x
x 2.8 cm.  There are no adnexal masses or free pelvic fluid.
IMPRESSION: Fibroid uterus.

The IUD is within the endometrial cavity.

## 2011-05-12 NOTE — Assessment & Plan Note (Signed)
I would set the patient up for an event monitor as well as an echo (with history of SOB)

## 2011-05-12 NOTE — Assessment & Plan Note (Signed)
LDL is hegih.  She needs to take the crestor.

## 2011-05-12 NOTE — Assessment & Plan Note (Signed)
BP is a little high on Cozaar.  She may still have a cough with this med.  I would not change for now.  I would follow bp.

## 2011-05-12 NOTE — Assessment & Plan Note (Signed)
Discussed the importance of weight loss.  May explain some or all of the SOB>

## 2011-05-13 ENCOUNTER — Encounter (INDEPENDENT_AMBULATORY_CARE_PROVIDER_SITE_OTHER): Payer: Medicaid Other

## 2011-05-13 ENCOUNTER — Ambulatory Visit (HOSPITAL_COMMUNITY): Payer: Medicaid Other | Attending: Internal Medicine

## 2011-05-13 DIAGNOSIS — R0609 Other forms of dyspnea: Secondary | ICD-10-CM | POA: Insufficient documentation

## 2011-05-13 DIAGNOSIS — R002 Palpitations: Secondary | ICD-10-CM

## 2011-05-13 DIAGNOSIS — R072 Precordial pain: Secondary | ICD-10-CM

## 2011-05-13 DIAGNOSIS — R0989 Other specified symptoms and signs involving the circulatory and respiratory systems: Secondary | ICD-10-CM | POA: Insufficient documentation

## 2011-05-14 ENCOUNTER — Encounter: Payer: Self-pay | Admitting: Family Medicine

## 2011-05-18 ENCOUNTER — Other Ambulatory Visit: Payer: Self-pay | Admitting: Family Medicine

## 2011-05-22 ENCOUNTER — Encounter: Payer: Self-pay | Admitting: Family Medicine

## 2011-06-19 ENCOUNTER — Telehealth: Payer: Self-pay | Admitting: *Deleted

## 2011-06-19 MED ORDER — METOPROLOL TARTRATE 25 MG PO TABS: 25.0000 mg | ORAL_TABLET | Freq: Two times a day (BID) | ORAL | Status: DC

## 2011-06-19 NOTE — Telephone Encounter (Signed)
Spoke with Danielle Shaw, aware monitor reviewed by dr Tenny Craw shows sinus with PAC, PVC's. One short burst of atrial fib. Danielle Shaw instructed to start lopressor 25 mg bid. appt made for Danielle Shaw to follow up with dr Tenny Craw [name]

## 2011-07-23 ENCOUNTER — Encounter: Payer: Self-pay | Admitting: Internal Medicine

## 2011-07-23 ENCOUNTER — Ambulatory Visit (INDEPENDENT_AMBULATORY_CARE_PROVIDER_SITE_OTHER): Payer: Medicaid Other | Admitting: Internal Medicine

## 2011-07-23 DIAGNOSIS — R002 Palpitations: Secondary | ICD-10-CM

## 2011-07-23 DIAGNOSIS — J329 Chronic sinusitis, unspecified: Secondary | ICD-10-CM

## 2011-07-23 DIAGNOSIS — I10 Essential (primary) hypertension: Secondary | ICD-10-CM

## 2011-07-23 MED ORDER — AZITHROMYCIN 250 MG PO TABS: ORAL_TABLET | ORAL | Status: AC

## 2011-07-23 MED ORDER — DILTIAZEM HCL 30 MG PO TABS: 30.0000 mg | ORAL_TABLET | Freq: Two times a day (BID) | ORAL | Status: DC

## 2011-07-23 NOTE — Progress Notes (Signed)
HPI Patient is a 40 year old with a history of palpitations  I saw her back in October.  Echo was normal.   Holter showed PACs, PVCs  One short burst of afib. I placed her on a b blocker.  She did not tolerated this  Felt dopey. Since seen she continues to have intermittent palpitations.   She now has a URI that started earlier in the week   Coughing up yellow/greenisch sputum.    Allergies  Allergen Reactions  . Latex     Current Outpatient Prescriptions  Medication Sig Dispense Refill  . aspirin 81 MG tablet Take 81 mg by mouth daily.       [name] losartan (COZAAR) 25 MG tablet Take 1 tablet (25 mg total) by mouth daily.  30 tablet  11  . omeprazole (PRILOSEC) 20 MG capsule Take 20 mg by mouth daily.        . Polyethylene Glycol 3350 POWD 17 GM in 4 oz water two times a day for one week then daily, QS       . rosuvastatin (CRESTOR) 20 MG tablet Take 20 mg by mouth at bedtime. (will need PA, no reduction on simvastatin)      . traMADol (ULTRAM) 50 MG tablet TAKE ONE TO TWO TABLETS BY MOUTH EVERY 4 TO 6 HOURS FOR COUGH OR PAIN  50 tablet  0  . azithromycin (ZITHROMAX Z-PAK) 250 MG tablet Take 2 tabs the first day then 1 tab the next  4 days  6 each  0  . diltiazem (CARDIZEM) 30 MG tablet Take 1 tablet (30 mg total) by mouth 2 (two) times daily with a meal.  60 tablet  6  . famotidine (PEPCID) 40 MG tablet Take 40 mg by mouth 2 (two) times daily as needed.         Past Medical History  Diagnosis Date  . IUD 2004  . IUD 2009  . Hypertension   . Arrhythmia     No past surgical history on file.  Family History  Problem Relation Age of Onset  . Heart disease Mother   . Diabetes Father   . Heart disease Maternal Grandmother     History   Social History  . Marital Status: Single    Spouse Name: N/A    Number of Children: N/A  . Years of Education: N/A   Occupational History  . Not on file.   Social History Main Topics  . Smoking status: Never Smoker   . Smokeless tobacco:  Not on file  . Alcohol Use: Not on file  . Drug Use: Not on file  . Sexually Active: Not on file   Other Topics Concern  . Not on file   Social History Narrative  . No narrative on file    Review of Systems:  All systems reviewed.  They are negative to the above problem except as previously stated.  Vital Signs: BP 146/80  Pulse 72  Ht 5' 4.5" (1.638 m)  Wt 319 lb 1.9 oz (144.752 kg)  BMI 53.93 kg/m2  Physical Exam  Patient is in NAD HEENT:  Normocephalic, atraumatic. EOMI, PERRLA.  Neck: JVP is normal. No thyromegaly. No bruits.  Lungs: Upper airway wheezing.  Mild rhonchi..  Heart: Regular rate and rhythm. Normal S1, S2. No S3.   No significant murmurs. PMI not displaced.  Abdomen:  Obese.Supple, nontender. Normal bowel sounds. No masses. No hepatomegaly.  Extremities:   Good distal pulses throughout. No lower extremity  edema.  Musculoskeletal :moving all extremities.  Neuro:   alert and oriented x3.  CN II-XII grossly intact.   Assessment and Plan:

## 2011-07-23 NOTE — Assessment & Plan Note (Signed)
Now with acute symptoms of sinusitis / bronchitis.  Will Rx  With a Z pack.

## 2011-07-23 NOTE — Patient Instructions (Signed)
Call us to let us know how you are feeling.

## 2011-07-23 NOTE — Assessment & Plan Note (Signed)
Did not take this am due to cough.

## 2011-07-23 NOTE — Assessment & Plan Note (Signed)
I would try low dose cardiazem 30 bid.  Patient to call back in a few wks with respnse.

## 2011-10-14 ENCOUNTER — Ambulatory Visit (INDEPENDENT_AMBULATORY_CARE_PROVIDER_SITE_OTHER): Payer: Medicaid Other | Admitting: Family Medicine

## 2011-10-14 ENCOUNTER — Encounter: Payer: Self-pay | Admitting: Family Medicine

## 2011-10-14 VITALS — BP 168/134 | HR 93 | Temp 98.5°F | Ht 65.0 in | Wt 320.0 lb

## 2011-10-14 DIAGNOSIS — Z639 Problem related to primary support group, unspecified: Secondary | ICD-10-CM

## 2011-10-14 DIAGNOSIS — I10 Essential (primary) hypertension: Secondary | ICD-10-CM

## 2011-10-14 DIAGNOSIS — J069 Acute upper respiratory infection, unspecified: Secondary | ICD-10-CM

## 2011-10-14 DIAGNOSIS — F439 Reaction to severe stress, unspecified: Secondary | ICD-10-CM

## 2011-10-14 MED ORDER — HYDROCHLOROTHIAZIDE 25 MG PO TABS: 25.0000 mg | ORAL_TABLET | Freq: Every day | ORAL | Status: DC

## 2011-10-14 NOTE — Progress Notes (Signed)
  Subjective:    Patient ID: Danielle Shaw, female    DOB: 1971/05/02, 41 y.o.   MRN: [id number]  HPI Cold symptoms x4 days: Patient reports that symptoms first started with runny nose and postnasal drip. Then she began to have headaches and body aches. Positive cough. Positive eye redness. Did have fever 2 days ago. MAXIMUM TEMPERATURE was 100.0. No sore throat. No sick contacts. No vomiting. No diarrhea. No constipation. No abdominal pain. No lower extremity edema.  Blood pressure: Blood pressure 160/110 on recheck. Patient states she is not taking her medications because she is unable to afford them. States that she could take medications off with afford all this. Had reaction to lisinopril in the she did not feel good when she took it. Was placed on Cozaar but cannot afford it. Has not been taking her diltiazem either. Positive headache with cold symptoms above. No dizziness. No syncope. No vision changes.  Like stressors: Patient's daughter-age 39-recently diagnosed with cancer. Patient is feeling very stressed about this. States that she would feel better she has someone to talk to. States she cannot afford therapy.      Review of Systems As per above.    Objective:   Physical Exam  Constitutional: She appears well-developed.       Morbidly obese  HENT:  Head: Normocephalic and atraumatic.  Right Ear: External ear normal.  Left Ear: External ear normal.  Mouth/Throat: No oropharyngeal exudate.       Mild throat erythema, + clear nasal discharge.  Eyes: Conjunctivae and EOM are normal. Pupils are equal, round, and reactive to light. Right eye exhibits no discharge. Left eye exhibits no discharge. No scleral icterus.  Cardiovascular: Normal rate, regular rhythm and normal heart sounds.   No murmur heard. Pulmonary/Chest: Effort normal and breath sounds normal. No respiratory distress. She has no wheezes. She has no rales.  Abdominal: Soft. She exhibits no distension. There is no  tenderness.  Musculoskeletal: She exhibits no edema.  Skin: No rash noted.  Psychiatric:       Pt tearful when discussing her daughter's recent diagnosis of cancer          Assessment & Plan:

## 2011-10-14 NOTE — Patient Instructions (Addendum)
I think that you symptoms are due to a virus:  Cough: Mucinex OTC  Nasal congestion: Nasal saline spray  Headache: Ibuprofen 800mg  q 8 hours as needed.   Drink lots of fluids.  Blood pressure: Take HCTZ as directed.    Stress in life: Call [facility] -- I will give you her card.   Return in 1-2 for recheck of your blood pressure.

## 2011-10-15 DIAGNOSIS — F439 Reaction to severe stress, unspecified: Secondary | ICD-10-CM | POA: Insufficient documentation

## 2011-10-15 DIAGNOSIS — J40 Bronchitis, not specified as acute or chronic: Secondary | ICD-10-CM | POA: Insufficient documentation

## 2011-10-15 NOTE — Assessment & Plan Note (Signed)
Increased home stress 2/2 pt's daughters recent diagnosis of cancer.  Tearful when discussing. States she agrees she would feel better if she was able to have someone to talk to about her stress/worries.  Would like to talk to [place], CSW.  Will send referral - via voicemail to [facility] concerning this patient.

## 2011-10-15 NOTE — Assessment & Plan Note (Signed)
Symptomatic treatment only at this time.  See pt instructions.

## 2011-10-15 NOTE — Assessment & Plan Note (Signed)
Pt can not afford cozaar- not taking diltiazem.  Will change pt to HCTZ on $4 list.  Pt states she has taken but doesn't remember how it worked for her.  Pt to take daily and return in 1- 2 weeks for recheck.  Discussed importance of good bp control.

## 2011-10-22 ENCOUNTER — Ambulatory Visit (INDEPENDENT_AMBULATORY_CARE_PROVIDER_SITE_OTHER): Payer: Medicaid Other | Admitting: Emergency Medicine

## 2011-10-22 ENCOUNTER — Encounter: Payer: Self-pay | Admitting: Emergency Medicine

## 2011-10-22 VITALS — BP 157/100 | HR 86 | Ht 65.0 in | Wt 319.0 lb

## 2011-10-22 DIAGNOSIS — G4700 Insomnia, unspecified: Secondary | ICD-10-CM

## 2011-10-22 DIAGNOSIS — F439 Reaction to severe stress, unspecified: Secondary | ICD-10-CM

## 2011-10-22 DIAGNOSIS — Z639 Problem related to primary support group, unspecified: Secondary | ICD-10-CM

## 2011-10-22 DIAGNOSIS — E785 Hyperlipidemia, unspecified: Secondary | ICD-10-CM

## 2011-10-22 DIAGNOSIS — I10 Essential (primary) hypertension: Secondary | ICD-10-CM

## 2011-10-22 MED ORDER — ZOLPIDEM TARTRATE 10 MG PO TABS: 10.0000 mg | ORAL_TABLET | Freq: Every evening | ORAL | Status: DC | PRN

## 2011-10-22 MED ORDER — SIMVASTATIN 80 MG PO TABS: 80.0000 mg | ORAL_TABLET | Freq: Every day | ORAL | Status: DC

## 2011-10-22 MED ORDER — DILTIAZEM HCL 30 MG PO TABS: 30.0000 mg | ORAL_TABLET | Freq: Two times a day (BID) | ORAL | Status: DC

## 2011-10-22 NOTE — Assessment & Plan Note (Signed)
Provided numbers for [facility] [place] and [place].  Also gave number for Jaynee Eagles if her Medicaid does not go through.

## 2011-10-22 NOTE — Patient Instructions (Signed)
It was nice to meet you.  I'm sorry you're having such a hard time right now.  For your blood pressure -continue the HCTZ 25mg  daily -start diltiazem 30mg  twice a day (sent to [facility] ph[facility])  For your cholesterol -start simvastatin 80mg  daily (this is cheapest at [employer] ($4) or [facility] ($10))  For your life stress -You can call [facility] [place] at [phone] or [place] at [phone].  If your Medicaid does not get approved, please call Jaynee Eagles at [phone] to see if you qualify for the orange card.  Please come back in 1-2 months to follow up your blood pressure.

## 2011-10-22 NOTE — Assessment & Plan Note (Signed)
Uncontrolled.  Currently only taking HCTZ 25mg .  Diltiazem is on $4 list at [facility], will add back diltiazem 30mg  BID.  Follow up in 1-2 months for BP recheck.

## 2011-10-22 NOTE — Assessment & Plan Note (Signed)
Likely secondary to life stressors.  Will give Ambien 10mg  #30 to aid in sleep.  Suspect this will help stress level and BP.

## 2011-10-22 NOTE — Assessment & Plan Note (Signed)
Switched from Crestor to simvastatin 80mg  due to cost issues.

## 2011-10-22 NOTE — Progress Notes (Signed)
  Subjective:    Patient ID: Danielle Shaw, female    DOB: 1970-11-04, 41 y.o.   MRN: [id number]  HPI Danielle Shaw is here for BP and sleep.  1. HTN: Currently uncontrolled; has had issues with paying for her medication since daughter diagnosed with cancer.  No vision changes or headaches.  Does have some intermittent sharp chest pain located to the left of the sternum and sometimes radiating to under her left breast.  Described as sharp (like getting a tattoo).  Typically lasts a few minutes.  No association with activity, no associated diaphoresis or SOB.  Does have palpitations (seen by cardiology who noted PAC, PVC and brief A fib on Holter, was unable to tolerate beta-blocker).  + intermittent depended leg edema.  2. Sleep: Has a long history of difficulty sleeping but has worsened since daughter's diagnosis 6 weeks ago.  Now having trouble both falling asleep and staying asleep.  Has been taking Tramadol to help fall asleep.  Averaging <4-5hr per night.  Eyes will get tired during the day, causing brief episodes of double vision while watching TV.  I have reviewed and updated the following as appropriate: allergies, current medications and past family history FHx: heart disease in mom, dad, aunt, uncle SHx: non-smoker  Review of Systems See HPI    Objective:   Physical Exam BP 157/100  Pulse 86  Ht 5\' 5"  (1.651 m)  Wt 319 lb (144.697 kg)  BMI 53.08 kg/m2 Gen: obese, alert, cooperative, appears stressed, NAD HEENT: AT/[place], sclera white, EOMIs CV: RRR, no murmurs Pulm: CTAB Ext: no edema, palpable DP pulses     Assessment & Plan:

## 2011-11-19 ENCOUNTER — Telehealth: Payer: Self-pay | Admitting: Emergency Medicine

## 2011-11-19 NOTE — Telephone Encounter (Signed)
Changed pharmacies and is needing refill on her tramadol -   [facility] [place]

## 2011-11-19 NOTE — Telephone Encounter (Signed)
Will route note to Dr. Elwyn Reach and call patient back.  Gaylene Brooks, RN

## 2011-11-20 MED ORDER — TRAMADOL HCL 50 MG PO TABS: 50.0000 mg | ORAL_TABLET | Freq: Three times a day (TID) | ORAL | Status: DC | PRN

## 2011-11-20 NOTE — Telephone Encounter (Signed)
Called patient and she states she never filled the Ambien Rx because it was too expensive.  Therefore, patient continued taking her Tramadol.  Will let Dr. Elwyn Reach know and and call patient back.  Gaylene Brooks, RN

## 2011-11-20 NOTE — Telephone Encounter (Signed)
I have refilled the tramadol.  This is not the best medication to take for sleep issues.  Please have her make an appointment to discuss other options.

## 2011-11-20 NOTE — Telephone Encounter (Signed)
Patient informed that Tramadol refilled.  Scheduled follow-up appt with Dr. Elwyn Reach for 12/11/11 @ 10:30am.  Gaylene Brooks, RN

## 2011-12-11 ENCOUNTER — Ambulatory Visit: Payer: Medicaid Other | Admitting: Emergency Medicine

## 2011-12-17 ENCOUNTER — Ambulatory Visit: Payer: Medicaid Other | Admitting: Emergency Medicine

## 2012-02-19 ENCOUNTER — Ambulatory Visit (INDEPENDENT_AMBULATORY_CARE_PROVIDER_SITE_OTHER): Payer: Medicaid Other | Admitting: Emergency Medicine

## 2012-02-19 ENCOUNTER — Encounter: Payer: Self-pay | Admitting: Emergency Medicine

## 2012-02-19 VITALS — BP 155/102 | HR 73 | Ht 65.0 in | Wt 318.0 lb

## 2012-02-19 DIAGNOSIS — G4700 Insomnia, unspecified: Secondary | ICD-10-CM

## 2012-02-19 DIAGNOSIS — F32A Depression, unspecified: Secondary | ICD-10-CM | POA: Insufficient documentation

## 2012-02-19 DIAGNOSIS — F329 Major depressive disorder, single episode, unspecified: Secondary | ICD-10-CM

## 2012-02-19 DIAGNOSIS — J329 Chronic sinusitis, unspecified: Secondary | ICD-10-CM

## 2012-02-19 DIAGNOSIS — I10 Essential (primary) hypertension: Secondary | ICD-10-CM

## 2012-02-19 DIAGNOSIS — E785 Hyperlipidemia, unspecified: Secondary | ICD-10-CM

## 2012-02-19 LAB — LIPID PANEL
HDL: 34 mg/dL — ABNORMAL LOW (ref >39)
LDL Cholesterol: 189 mg/dL — ABNORMAL HIGH (ref 0–99)
Total CHOL/HDL Ratio: 7.6 Ratio

## 2012-02-19 MED ORDER — LORATADINE 10 MG PO TABS: 10.0000 mg | ORAL_TABLET | Freq: Every day | ORAL | Status: DC

## 2012-02-19 MED ORDER — CITALOPRAM HYDROBROMIDE 20 MG PO TABS: 20.0000 mg | ORAL_TABLET | Freq: Every day | ORAL | Status: DC

## 2012-02-19 MED ORDER — TRAZODONE HCL 50 MG PO TABS: 25.0000 mg | ORAL_TABLET | Freq: Every evening | ORAL | Status: DC | PRN

## 2012-02-19 MED ORDER — TRAMADOL HCL 50 MG PO TABS: 50.0000 mg | ORAL_TABLET | Freq: Three times a day (TID) | ORAL | Status: DC | PRN

## 2012-02-19 MED ORDER — LOSARTAN POTASSIUM-HCTZ 50-12.5 MG PO TABS: 1.0000 | ORAL_TABLET | Freq: Every day | ORAL | Status: DC

## 2012-02-19 NOTE — Assessment & Plan Note (Signed)
Likely related to depression.

## 2012-02-19 NOTE — Patient Instructions (Signed)
It was good to see you again.  We have adjusted some of your medicines. - STOP taking Hydrochlorothiazide - START taking Cozaar-HCTZ combined pill - START taking loradatine 10mg  daily - START taking celexa 20mg  daily - ALTERNATE trazadone (1/2 to 1 tab as needed at bedtime) and tramadol for sleep  Follow up with me in 2 weeks to see how the medicines are working.

## 2012-02-19 NOTE — Progress Notes (Signed)
  Subjective:    Patient ID: Danielle Shaw, female    DOB: [birth year], 41 y.o.   MRN: [id number]  HPI Danielle Shaw is here for follow up.  Hypertension Well controlled: no Compliant with medication: yes Side effects from medication: no Check BP at home: no  Chest pain: no Palpitations: no Vision changes: no Leg edema: no Dizziness: no  Insomnia Continues to have difficulty falling and staying asleep. Ambien did not help.  Tramadol does help some, but makes her feel a little off the next day. In setting of daughter diagnosed with leukemia earlier this year.  Also reports decreased appetite, energy, concentration, activity.  Does have new boyfriend who tries to get her up and moving, but reports that it takes enormous effort just to get up some days.  Was on an anti-depressant after her mother's death in January 19, 2006, but cannot remember the name.  Sinuses Reports that she has watery eyes, plugged ears and mucous in throat.  Not currently taking anything.  I have reviewed and updated the following as appropriate: allergies and current medications  Review of Systems See HPI    Objective:   Physical Exam BP 155/102  Pulse 73  Ht 5\' 5"  (1.651 m)  Wt 318 lb (144.244 kg)  BMI 52.92 kg/m2 Gen: alert, cooperative, NAD HEENT: AT/[place], sclera white, MMM Neck: no LAD CV: RRR, no murmurs Pulm: CTAB, no wheezes or rales Ext: no edema Psych: affect appropriate, mood depressed     Assessment & Plan:

## 2012-02-19 NOTE — Assessment & Plan Note (Signed)
Likely situational given multiple stressors.  PHQ-9 17 (3 for depressed, sleep, energy; 2 for appetite, anhedonia, and guilt; 1 for concentration and psychomotor retardation).  Will start celexa 20mg  daily. Will also proved trazodone 25-50mg  qHS prn for sleep.  Patient to follow up in 2 weeks.

## 2012-02-19 NOTE — Assessment & Plan Note (Signed)
Not controlled.  Patient compliant with medications.  As she has Medicaid now, will change HCTZ to lostartan/HCTZ 50-12.5 combo pill.  Continue diltiazem.  Follow up in 2 weeks.  Will check BMP at that time.

## 2012-02-19 NOTE — Assessment & Plan Note (Signed)
Will start loratadine 10mg  daily.

## 2012-02-23 ENCOUNTER — Telehealth: Payer: Self-pay | Admitting: Emergency Medicine

## 2012-02-23 NOTE — Telephone Encounter (Signed)
Attempted to call patient to discuss lipid panel results.  Not able to reach patient or leave a message.  She does have a follow up appointment on 8/20.  At that time will need to discuss changing her statin to Crestor as there was no improvement in LDL on simvastatin 80mg .  Will likely need PA as well.

## 2012-03-15 ENCOUNTER — Ambulatory Visit (INDEPENDENT_AMBULATORY_CARE_PROVIDER_SITE_OTHER): Payer: Medicaid Other | Admitting: Emergency Medicine

## 2012-03-15 ENCOUNTER — Encounter: Payer: Self-pay | Admitting: Emergency Medicine

## 2012-03-15 ENCOUNTER — Telehealth: Payer: Self-pay | Admitting: *Deleted

## 2012-03-15 VITALS — BP 136/92 | HR 64 | Ht 65.0 in | Wt 316.0 lb

## 2012-03-15 DIAGNOSIS — I10 Essential (primary) hypertension: Secondary | ICD-10-CM

## 2012-03-15 DIAGNOSIS — F32A Depression, unspecified: Secondary | ICD-10-CM

## 2012-03-15 DIAGNOSIS — F329 Major depressive disorder, single episode, unspecified: Secondary | ICD-10-CM

## 2012-03-15 DIAGNOSIS — F3289 Other specified depressive episodes: Secondary | ICD-10-CM

## 2012-03-15 DIAGNOSIS — E785 Hyperlipidemia, unspecified: Secondary | ICD-10-CM

## 2012-03-15 DIAGNOSIS — G43909 Migraine, unspecified, not intractable, without status migrainosus: Secondary | ICD-10-CM

## 2012-03-15 MED ORDER — ROSUVASTATIN CALCIUM 20 MG PO TABS: 20.0000 mg | ORAL_TABLET | Freq: Every day | ORAL | Status: DC

## 2012-03-15 MED ORDER — PROCHLORPERAZINE MALEATE 10 MG PO TABS: 10.0000 mg | ORAL_TABLET | Freq: Three times a day (TID) | ORAL | Status: DC | PRN

## 2012-03-15 MED ORDER — PROPRANOLOL HCL 20 MG PO TABS: 20.0000 mg | ORAL_TABLET | Freq: Two times a day (BID) | ORAL | Status: DC

## 2012-03-15 NOTE — Telephone Encounter (Signed)
PA required for Crestor. Form placed in MD box. 

## 2012-03-15 NOTE — Progress Notes (Signed)
  Subjective:    Patient ID: Danielle Shaw, female    DOB: [birth year], 41 y.o.   MRN: [id number]  HPI Danielle Shaw is here for follow up.  1. Depression/insomnia: Trazadone is not working for sleep.  She is unable to report much about her depression symptoms as she has been more focused on her headache the last week.  Did report only intermittently taking the celexa due to concern that is was contributing to her headache.  2. Hypertension: Started on losartan-HCTZ combo last visit.  Reports intermittent compliance due to concerns about headache.  Did take it this morning, but not the last 4 days.  3. Headache: Present for 1 week.  Located on right side, occasional behind eyes.  Throbbing.  Associated with nausea and photophobia.  Worse with standing, some relief with lying down. Does describe some intermittent numbness/tingling mostly in left hand (lateral aspect and pinkie finger).  No neck stiffness or fevers, no focal neurologic symptoms.  Has tried imitrex in the past with no relief.  Has tried OTC excederin and motrin for this headache with no relief.  Did get a medication in the ED once that made her feel terrible, but does not remember what it was.  Reports that her migraines typically last 2-5 days and gets 2 "bad" ones a month.  I have reviewed and updated the following as appropriate: allergies and current medications  Review of Systems See HPI    Objective:   Physical Exam BP 136/92  Pulse 64  Ht 5\' 5"  (1.651 m)  Wt 316 lb (143.337 kg)  BMI 52.59 kg/m2 Gen: alert, cooperative, NAD HEENT: AT/[place], PERRL, fundoscopic exam with sharp disc borders, EOMI (mild pain with superior gaze), sclera white, MMM Neck: supple CV: RRR, no murmurs Pulm: CTAB Neuro: PERRL, CN II-XII intact bilaterally, 5/5 strength in all extremities, sensation grossly intact with area of "numbness" on lateral aspect of left hand     Assessment & Plan:

## 2012-03-15 NOTE — Assessment & Plan Note (Addendum)
Stable with intermittent celexa use.  Continue current dose of celexa, consider increasing at follow up in 2 weeks.  Will keep trazadone at 50mg  due to concern for serotonin syndrome.  Okay to try benadryl for sleep.

## 2012-03-15 NOTE — Assessment & Plan Note (Addendum)
No red flags for meningitis or increased ICP.  Current migraine has been present for last week.  Given report of bad reaction to a medicine given in the ED and poor response to imitrex with not give anything acutely in the office.  Will start propranolol 20mg  BID.  Will also give compazine for acute relief. Discussed possible dystonic reaction - take benadryl and call office.  If migraine does not respond to compazine, will try Maxalt.

## 2012-03-15 NOTE — Assessment & Plan Note (Signed)
No improvement on simvastatin 80mg .  Will change to Crestor.

## 2012-03-15 NOTE — Assessment & Plan Note (Signed)
Improved with medication change despite intermittent compliance.  Continue losartan-HCTZ - discussed unlikely to be related to headache.  Will change diltiazem to propranolol 20mg  BID.  BMP today.

## 2012-03-15 NOTE — Patient Instructions (Signed)
It was good to see you again.  Medications - STOP diltiazem - STOP simvastatin - START propranolol 20mg  twice a day - START crestor 20mg  once a day  We are going to try Compazine for your migraine.  If you experience any twitching movements after taking it, take some benadryl and call the office. For sleep you can try Benadryl 25mg  at bedtime.    Follow up in 1-2 weeks.

## 2012-03-16 LAB — BASIC METABOLIC PANEL
CO2: 29 mEq/L (ref 19–32)
Calcium: 9.5 mg/dL (ref 8.4–10.5)
Sodium: 139 mEq/L (ref 135–145)

## 2012-03-16 MED ORDER — ATORVASTATIN CALCIUM 80 MG PO TABS: 40.0000 mg | ORAL_TABLET | Freq: Every day | ORAL | Status: DC

## 2012-03-16 NOTE — Telephone Encounter (Signed)
Unable to complete PA for crestor as she has not failed 2 other preferred drugs.  Will send prescription for Lipitor 80mg , take 1/2 tab daily for 1 month, then 1 tab daily.  Will recheck LDL in 2 months.  Unable to reach patient by phone.  Will send letter and discuss at her follow up appointment on 9/5.

## 2012-03-31 ENCOUNTER — Encounter: Payer: Self-pay | Admitting: Emergency Medicine

## 2012-03-31 ENCOUNTER — Ambulatory Visit (INDEPENDENT_AMBULATORY_CARE_PROVIDER_SITE_OTHER): Payer: Medicaid Other | Admitting: Emergency Medicine

## 2012-03-31 VITALS — BP 133/92 | HR 60 | Ht 65.0 in | Wt 313.0 lb

## 2012-03-31 DIAGNOSIS — I10 Essential (primary) hypertension: Secondary | ICD-10-CM

## 2012-03-31 DIAGNOSIS — F329 Major depressive disorder, single episode, unspecified: Secondary | ICD-10-CM

## 2012-03-31 DIAGNOSIS — F32A Depression, unspecified: Secondary | ICD-10-CM

## 2012-03-31 DIAGNOSIS — E785 Hyperlipidemia, unspecified: Secondary | ICD-10-CM

## 2012-03-31 DIAGNOSIS — G43909 Migraine, unspecified, not intractable, without status migrainosus: Secondary | ICD-10-CM

## 2012-03-31 DIAGNOSIS — G4700 Insomnia, unspecified: Secondary | ICD-10-CM

## 2012-03-31 MED ORDER — ROSUVASTATIN CALCIUM 20 MG PO TABS: 20.0000 mg | ORAL_TABLET | Freq: Every day | ORAL | Status: DC

## 2012-03-31 MED ORDER — PROCHLORPERAZINE MALEATE 10 MG PO TABS: 10.0000 mg | ORAL_TABLET | Freq: Three times a day (TID) | ORAL | Status: DC | PRN

## 2012-03-31 NOTE — Patient Instructions (Addendum)
It was good to see you again!  I'm glad your headache is better.  Call me with the name of the medicine that made you feel drowsy.  Depending on which medicine it was, we'll adjust your other medications. For sleep, try taking 2 trazodone with a benadryl (you can also try taking 2 benadryl with 2 trazodone).  Let me know if this is not working and we can try a different medicine.  I will work on getting you Crestor for your cholesterol.  I will also send the compazine for migraines to [facility].  Follow up in 2-4 weeks.

## 2012-03-31 NOTE — Assessment & Plan Note (Signed)
Improved.  May not be able to tolerate the propranolol.  As long as migraines remain manageable, will not start additional prophylactic therapy at this time.  Would consider topomax in the future if needed.

## 2012-03-31 NOTE — Progress Notes (Signed)
  Subjective:    Patient ID: Danielle Shaw, female    DOB: 1970-12-12, 41 y.o.   MRN: [id number]  HPI Danielle Shaw is here for follow up.  1. Hypertension: Taking medication as prescribed with no trouble.  May not be taking propranolol as there was a medicine she said makes her drowsy, but couldn't remember which one.  She will call with the name of the medication. 2. Migraine: Improved.  States never got the compazine and maybe not tolerating propranolol.  Does still have a small headache some mornings, but much better. 3. Depression/Insomnia: Improved except for sleep.  Unable to fall asleep with trazodone 50mg  + 25mg  benadryl. 4. Hyperlipidemia: Frustrated with lipitor as she reports that she was on it with no improvement over a year ago.  States the only thing that has worked is [employer].  I have reviewed and updated the following as appropriate: allergies and current medications   Review of Systems See HPI    Objective:   Physical Exam BP 133/92  Pulse 60  Ht 5\' 5"  (1.651 m)  Wt 313 lb (141.976 kg)  BMI 52.09 kg/m2 Gen: alert, cooperative, NAD, appears happier HEENT: AT/[place], sclera white, MMM CV: RRR, no murmurs Pulm: CTAB Ext: no edema       Assessment & Plan:

## 2012-03-31 NOTE — Assessment & Plan Note (Signed)
Improved.  Continue current medications.  May increase losartan-HCTZ combo if patient unable to tolerate the propranolol.

## 2012-03-31 NOTE — Assessment & Plan Note (Signed)
Doing better.  Continue celexa.  PHQ-9 at next appointment.

## 2012-03-31 NOTE — Assessment & Plan Note (Signed)
Patient reports having been on lipitor in the past with no improvement.  Will reorder crestor 20mg .

## 2012-03-31 NOTE — Assessment & Plan Note (Signed)
Still not sleeping.  Will try trazodone 100mg  + benadryl 25-50mg .  If this does not work, patient to call clinic.  Will try seroquel 25mg  next.

## 2012-04-20 ENCOUNTER — Telehealth: Payer: Self-pay | Admitting: *Deleted

## 2012-04-20 NOTE — Telephone Encounter (Signed)
Received notice from [facility] that Crestor requires PA.  PA done through [facility] and  then I called medicaid and was told it has already been approved as of 09/07. Pharmacy notified .

## 2012-04-22 ENCOUNTER — Ambulatory Visit: Payer: Medicaid Other | Admitting: Emergency Medicine

## 2012-06-29 ENCOUNTER — Emergency Department (HOSPITAL_COMMUNITY)
Admission: EM | Admit: 2012-06-29 | Discharge: 2012-06-29 | Disposition: A | Discharge status: Home / Self Care | Payer: Medicaid Other | Attending: Emergency Medicine | Admitting: Emergency Medicine

## 2012-06-29 ENCOUNTER — Emergency Department (HOSPITAL_COMMUNITY): Payer: Medicaid Other

## 2012-06-29 DIAGNOSIS — Z7982 Long term (current) use of aspirin: Secondary | ICD-10-CM | POA: Insufficient documentation

## 2012-06-29 DIAGNOSIS — Z79899 Other long term (current) drug therapy: Secondary | ICD-10-CM | POA: Insufficient documentation

## 2012-06-29 DIAGNOSIS — R062 Wheezing: Secondary | ICD-10-CM | POA: Insufficient documentation

## 2012-06-29 DIAGNOSIS — R059 Cough, unspecified: Secondary | ICD-10-CM | POA: Insufficient documentation

## 2012-06-29 DIAGNOSIS — R599 Enlarged lymph nodes, unspecified: Secondary | ICD-10-CM | POA: Insufficient documentation

## 2012-06-29 DIAGNOSIS — H53149 Visual discomfort, unspecified: Secondary | ICD-10-CM | POA: Insufficient documentation

## 2012-06-29 DIAGNOSIS — I10 Essential (primary) hypertension: Secondary | ICD-10-CM | POA: Insufficient documentation

## 2012-06-29 DIAGNOSIS — R509 Fever, unspecified: Secondary | ICD-10-CM | POA: Insufficient documentation

## 2012-06-29 DIAGNOSIS — R51 Headache: Secondary | ICD-10-CM | POA: Insufficient documentation

## 2012-06-29 DIAGNOSIS — I499 Cardiac arrhythmia, unspecified: Secondary | ICD-10-CM | POA: Insufficient documentation

## 2012-06-29 DIAGNOSIS — Z309 Encounter for contraceptive management, unspecified: Secondary | ICD-10-CM | POA: Insufficient documentation

## 2012-06-29 DIAGNOSIS — R05 Cough: Secondary | ICD-10-CM | POA: Insufficient documentation

## 2012-06-29 DIAGNOSIS — R5381 Other malaise: Secondary | ICD-10-CM | POA: Insufficient documentation

## 2012-06-29 DIAGNOSIS — J111 Influenza due to unidentified influenza virus with other respiratory manifestations: Secondary | ICD-10-CM | POA: Insufficient documentation

## 2012-06-29 DIAGNOSIS — Z975 Presence of (intrauterine) contraceptive device: Secondary | ICD-10-CM | POA: Insufficient documentation

## 2012-06-29 DIAGNOSIS — R6889 Other general symptoms and signs: Secondary | ICD-10-CM

## 2012-06-29 LAB — BASIC METABOLIC PANEL
BUN: 8 mg/dL (ref 6–23)
CO2: 24 mEq/L (ref 19–32)
Calcium: 9 mg/dL (ref 8.4–10.5)
Creatinine, Ser: 0.87 mg/dL (ref 0.50–1.10)
GFR calc non Af Amer: 83 mL/min — ABNORMAL LOW (ref >90)
Glucose, Bld: 105 mg/dL — ABNORMAL HIGH (ref 70–99)
Sodium: 135 mEq/L (ref 135–145)

## 2012-06-29 LAB — CBC
Hemoglobin: 13.2 g/dL (ref 12.0–15.0)
MCH: 29.5 pg (ref 26.0–34.0)
MCHC: 33.7 g/dL (ref 30.0–36.0)
MCV: 87.7 fL (ref 78.0–100.0)
RBC: 4.47 MIL/uL (ref 3.87–5.11)

## 2012-06-29 IMAGING — CR DG CHEST 2V
2 series · 2 of 2 positions shown · non-contrast
Comparison: [DATE].

CLINICAL DATA: Generalized body aches and weakness.

CHEST - 2 VIEW

[w chest pa]
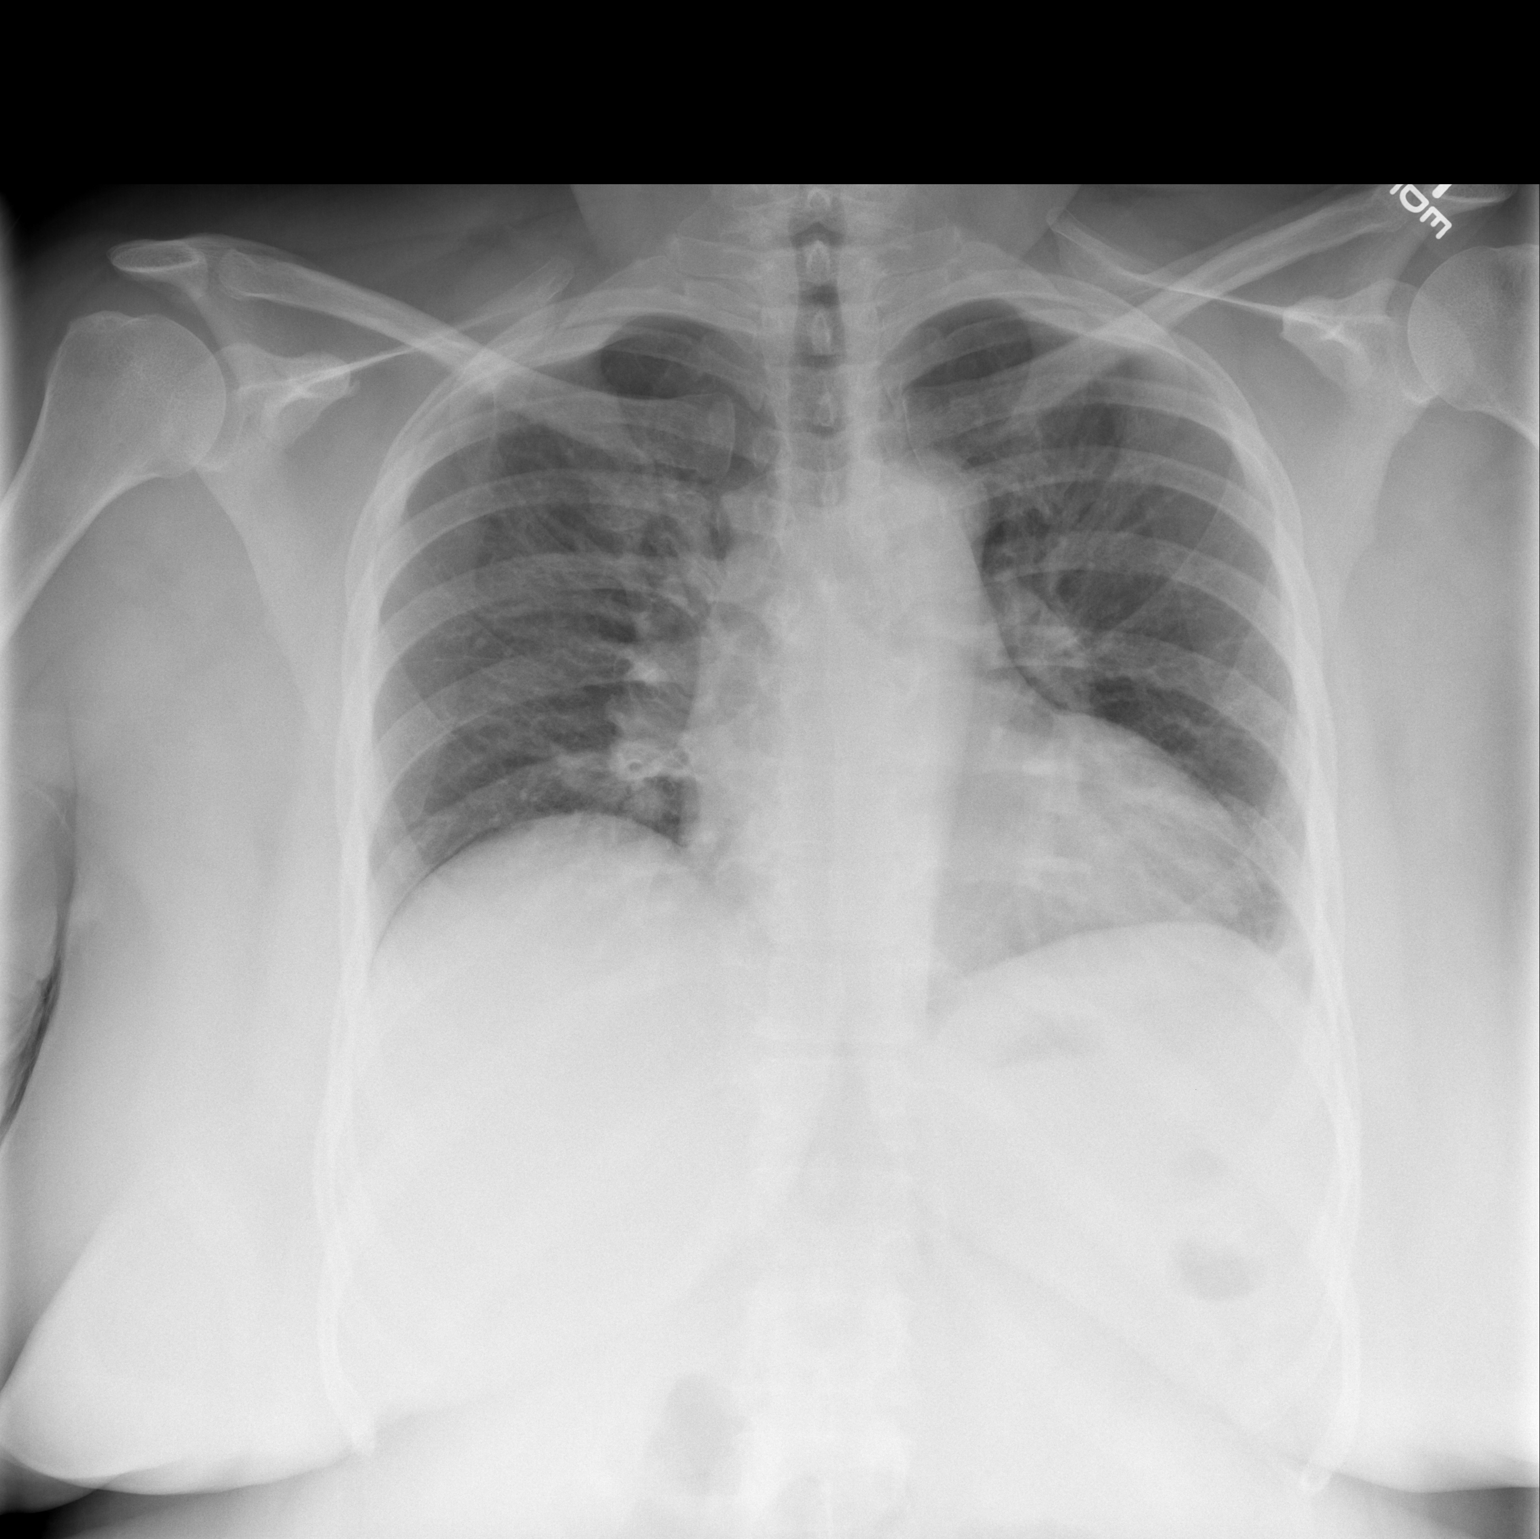

[w chest lat]
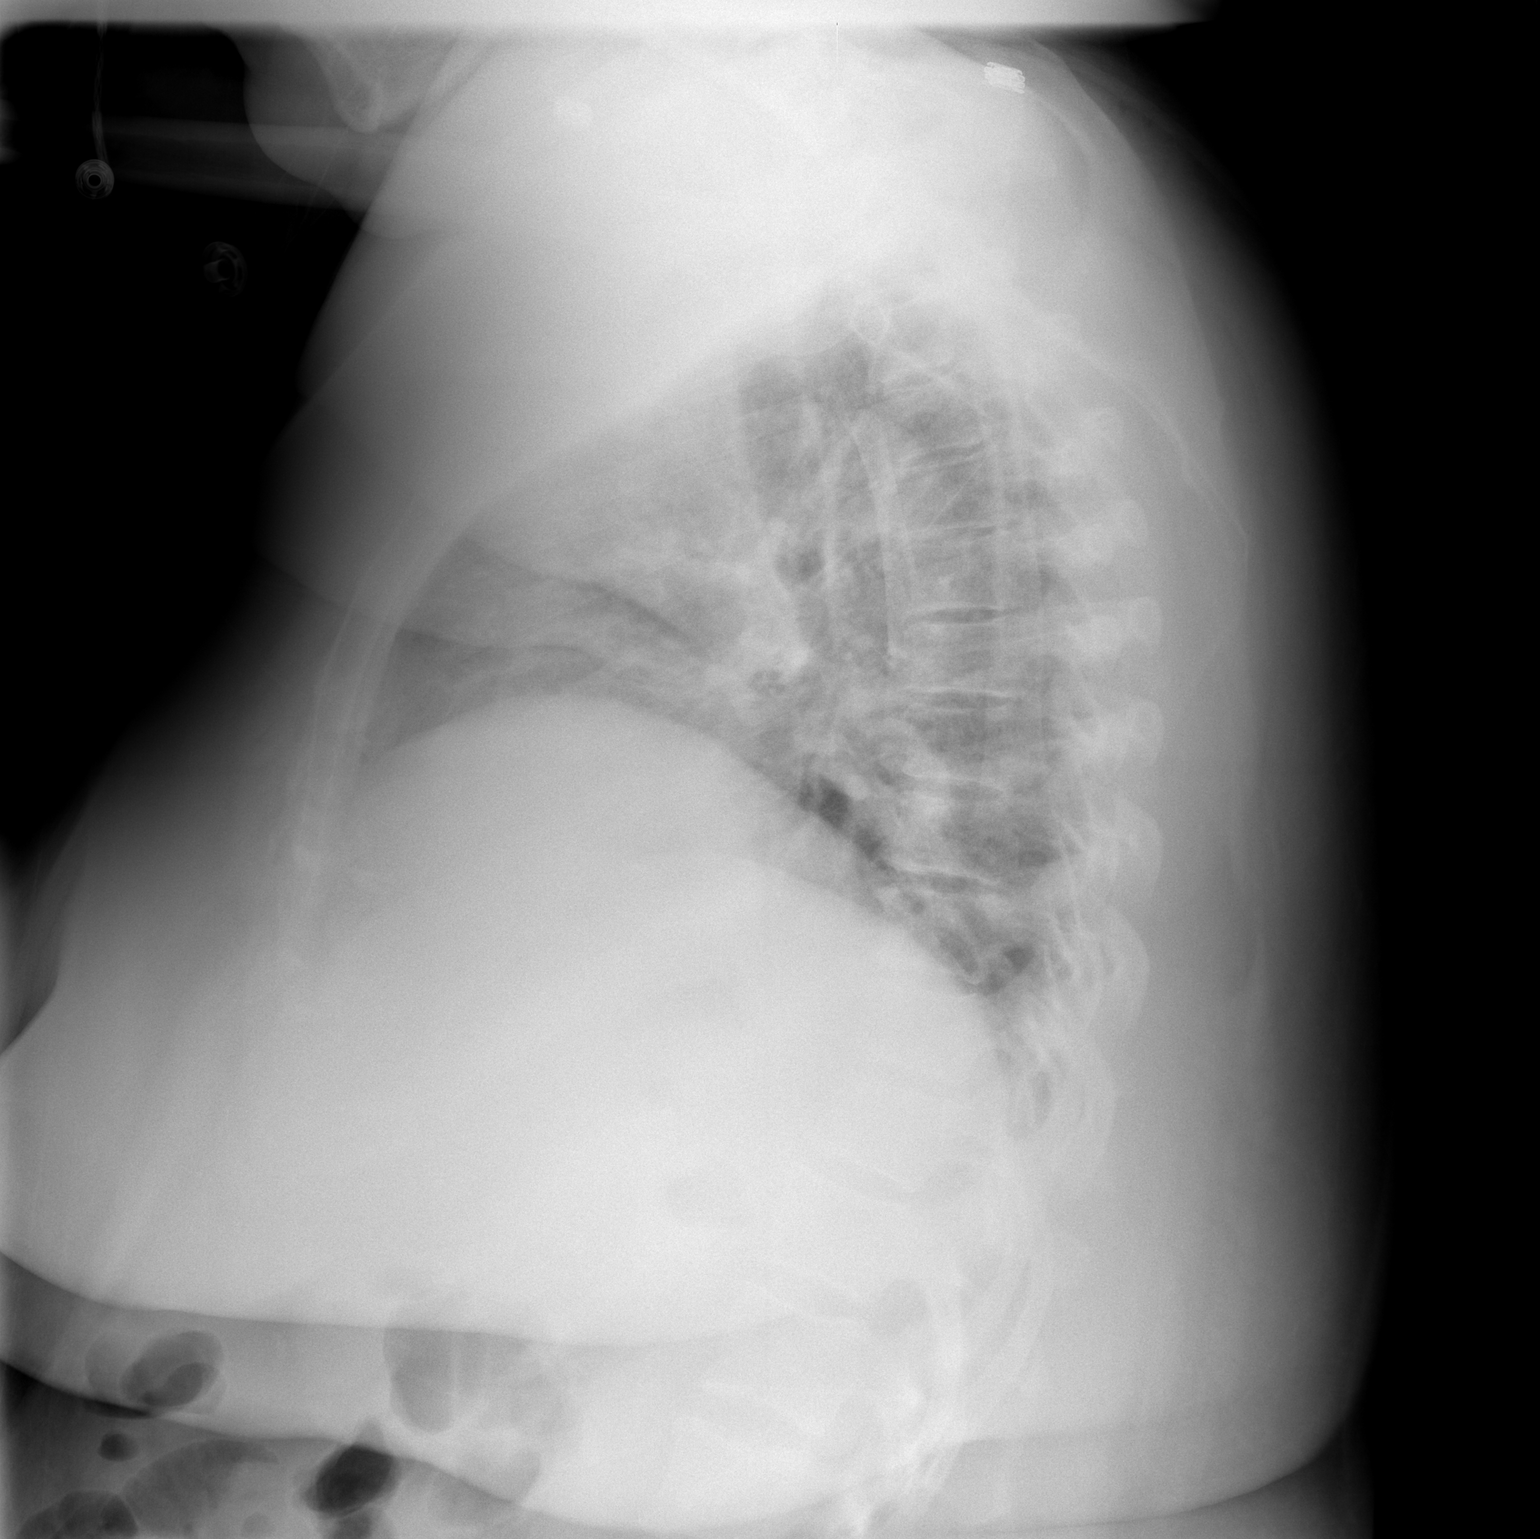

[2 of 2 positions shown; findings below may reference images not displayed]

FINDINGS: The heart is mildly enlarged but stable.  Prominent
mediastinal and hilar contours are unchanged.  Low lung volumes
with vascular crowding and streaky atelectasis along with mild
vascular congestion.  No overt pulmonary edema.  No pleural
effusions.  The bony thorax is intact.
IMPRESSION: Mild stable cardiac enlargement with central vascular congestion.
No overt pulmonary edema.

## 2012-06-29 MED ORDER — SODIUM CHLORIDE 0.9 % IV BOLUS (SEPSIS)
1000.0000 mL | Freq: Once | INTRAVENOUS | Status: AC
  Administered 2012-06-29: 1000 mL via INTRAVENOUS

## 2012-06-29 MED ORDER — ACETAMINOPHEN 500 MG PO TABS
1000.0000 mg | ORAL_TABLET | Freq: Once | ORAL | Status: AC
  Administered 2012-06-29: 1000 mg via ORAL
  Filled 2012-06-29: qty 2

## 2012-06-29 MED ORDER — OSELTAMIVIR PHOSPHATE 75 MG PO CAPS: 75.0000 mg | ORAL_CAPSULE | Freq: Two times a day (BID) | ORAL | Status: DC

## 2012-06-29 MED ORDER — HYDROCODONE-HOMATROPINE 5-1.5 MG/5ML PO SYRP: 5.0000 mL | ORAL_SOLUTION | Freq: Four times a day (QID) | ORAL | Status: DC | PRN

## 2012-06-29 MED ORDER — POTASSIUM CHLORIDE CRYS ER 20 MEQ PO TBCR
40.0000 meq | EXTENDED_RELEASE_TABLET | Freq: Once | ORAL | Status: AC
  Administered 2012-06-29: 40 meq via ORAL
  Filled 2012-06-29: qty 2

## 2012-06-29 MED ORDER — OSELTAMIVIR PHOSPHATE 75 MG PO CAPS
75.0000 mg | ORAL_CAPSULE | Freq: Once | ORAL | Status: AC
  Administered 2012-06-29: 75 mg via ORAL
  Filled 2012-06-29: qty 1

## 2012-06-29 MED ORDER — DIPHENHYDRAMINE HCL 50 MG/ML IJ SOLN
25.0000 mg | Freq: Once | INTRAMUSCULAR | Status: AC
  Administered 2012-06-29: 25 mg via INTRAVENOUS
  Filled 2012-06-29: qty 1

## 2012-06-29 MED ORDER — HYDROCOD POLST-CHLORPHEN POLST 10-8 MG/5ML PO LQCR
10.0000 mL | Freq: Once | ORAL | Status: AC
  Administered 2012-06-29: 10 mL via ORAL
  Filled 2012-06-29: qty 10

## 2012-06-29 MED ORDER — SODIUM CHLORIDE 0.9 % IV SOLN
INTRAVENOUS | Status: DC
  Administered 2012-06-29: 09:00:00 via INTRAVENOUS

## 2012-06-29 MED ORDER — METOCLOPRAMIDE HCL 5 MG/ML IJ SOLN
10.0000 mg | Freq: Once | INTRAMUSCULAR | Status: AC
  Administered 2012-06-29: 10 mg via INTRAVENOUS
  Filled 2012-06-29: qty 2

## 2012-06-29 NOTE — ED Notes (Signed)
Pt ate snack of grhamn crackers and Ginger Ale. Tolerated well. Denies pain or nausea at present time

## 2012-06-29 NOTE — ED Notes (Signed)
Pt going to sit with nephew who is a patient in ED also.  Mask given to pt to wear at home around daughter

## 2012-06-29 NOTE — ED Provider Notes (Signed)
History     CSN: [id number]  Arrival date & time 06/29/12  8295   First MD Initiated Contact with Patient 06/29/12 [phone]      Chief Complaint  Patient presents with  . Generalized Body Aches    Multiple Compaints    (Consider location/radiation/quality/duration/timing/severity/associated sxs/prior treatment) Patient is a 41 y.o. female presenting with URI.  URI The primary symptoms include fever, fatigue, headaches, swollen glands, cough, wheezing and myalgias. Primary symptoms do not include ear pain, sore throat, abdominal pain, nausea, vomiting, arthralgias or rash. The current episode started 2 days ago. This is a new problem. The problem has been gradually worsening.  The fever began 2 days ago. The fever has been unchanged since its onset. The maximum temperature recorded prior to her arrival was 102 to 102.9 F. The temperature was taken by a tympanic thermometer.  The headache began yesterday. The headache developed gradually. Headache is a recurrent problem. The pain from the headache is at a severity of 6/10. The headache is associated with aura and photophobia. The headache is not associated with double vision, eye pain, decreased vision, stiff neck, neck stiffness, paresthesias, weakness or loss of balance.  The swelling is not associated with speech difficulty or neck pain.  The cough began 2 days ago. The cough is productive. The sputum is yellow.  The myalgias are not associated with weakness.  The onset of the illness is associated with exposure to sick contacts (contact recently admitted adn dx w influenza). Symptoms associated with the illness include chills, sinus pressure and congestion. The illness is not associated with plugged ear sensation, facial pain or rhinorrhea. The following treatments were addressed: Acetaminophen was not tried. A decongestant was not tried. Aspirin was not tried. NSAIDs were not tried.    Past Medical History  Diagnosis Date  . IUD 2004  .  IUD 2009  . Hypertension   . Arrhythmia     No past surgical history on file.  Family History  Problem Relation Age of Onset  . Heart disease Mother   . Diabetes Father   . Heart disease Father   . Heart disease Maternal Grandmother     History  Substance Use Topics  . Smoking status: Never Smoker   . Smokeless tobacco: Not on file  . Alcohol Use: Not on file    OB History    Grav Para Term Preterm Abortions TAB SAB Ect Mult Living                  Review of Systems  Constitutional: Positive for fever, chills and fatigue.  HENT: Positive for congestion and sinus pressure. Negative for ear pain, sore throat, rhinorrhea, sneezing, neck pain, neck stiffness and tinnitus.   Eyes: Positive for photophobia. Negative for double vision, pain and visual disturbance.  Respiratory: Positive for cough, chest tightness and wheezing.   Cardiovascular: Negative for chest pain and palpitations.  Gastrointestinal: Negative for nausea, vomiting, abdominal pain and diarrhea.  Genitourinary: Negative for dysuria.  Musculoskeletal: Positive for myalgias. Negative for arthralgias.  Skin: Negative for color change and rash.  Neurological: Positive for headaches. Negative for dizziness, seizures, syncope, facial asymmetry, speech difficulty, weakness, numbness, paresthesias and loss of balance.  Hematological: Does not bruise/bleed easily.  Psychiatric/Behavioral: Negative for confusion.  All other systems reviewed and are negative.    Allergies  Latex; Lisinopril; and Metoprolol  Home Medications   Current Outpatient Rx  Name  Route  Sig  Dispense  Refill  .  ASPIRIN 81 MG PO TABS   Oral   Take 81 mg by mouth daily.          [name] CITALOPRAM HYDROBROMIDE 20 MG PO TABS   Oral   Take 20 mg by mouth daily.         [name] FAMOTIDINE 40 MG PO TABS   Oral   Take 40 mg by mouth 2 (two) times daily as needed. For indigestion         . LORATADINE 10 MG PO TABS   Oral   Take 10 mg by  mouth daily.         [name] LOSARTAN POTASSIUM-HCTZ 50-12.5 MG PO TABS   Oral   Take 1 tablet by mouth daily.         [name] OMEPRAZOLE 20 MG PO CPDR   Oral   Take 20 mg by mouth daily.           [name] POLYETHYLENE GLYCOL 3350 POWD   Oral   Take 17 g by mouth daily. 17 GM in 4 oz water two times a day for one week then daily, QS         . PROPRANOLOL HCL 20 MG PO TABS   Oral   Take 20 mg by mouth 2 (two) times daily.         [name] ROSUVASTATIN CALCIUM 20 MG PO TABS   Oral   Take 20 mg by mouth daily.         . TRAMADOL HCL 50 MG PO TABS   Oral   Take 50 mg by mouth every 8 (eight) hours as needed. For pain         . TRAZODONE HCL 50 MG PO TABS   Oral   Take 25-50 mg by mouth at bedtime as needed. For nausea         . PROCHLORPERAZINE MALEATE 10 MG PO TABS   Oral   Take 1 tablet (10 mg total) by mouth every 8 (eight) hours as needed (migraine).   15 tablet   0   . TRAZODONE HCL 50 MG PO TABS   Oral   Take 0.5-1 tablets (25-50 mg total) by mouth at bedtime as needed for sleep.   30 tablet   3     BP 117/70  Pulse 112  Temp 103 F (39.4 C) (Oral)  Resp 26  SpO2 95%  Physical Exam  Constitutional: She is oriented to person, place, and time. She appears well-developed and well-nourished. She appears distressed.       Febrile, 103  HENT:  Head: Normocephalic and atraumatic. No trismus in the jaw.  Right Ear: Tympanic membrane and external ear normal. No drainage or tenderness. No mastoid tenderness.  Left Ear: Tympanic membrane and external ear normal. No drainage or tenderness. No mastoid tenderness.  Nose: Nose normal. No rhinorrhea or sinus tenderness.  Mouth/Throat: Uvula is midline, oropharynx is clear and moist and mucous membranes are normal. No uvula swelling. No oropharyngeal exudate.  Eyes: Conjunctivae normal and EOM are normal. Right eye exhibits no discharge. Left eye exhibits no discharge. No scleral icterus.  Neck: Normal range of motion. Neck  supple. Muscular tenderness present. No spinous process tenderness present. No rigidity.       Soft no nuchal rigidity, normal flexion extension and rotation  Cardiovascular: Regular rhythm and normal heart sounds.        tachycardic  Pulmonary/Chest: Effort normal and breath sounds normal. No stridor. No respiratory distress. She  exhibits tenderness.       No acute respiratory distress, able to speak full sentences, LCAB  Abdominal: Soft. There is no tenderness.       Morbidly obese, soft non tender  Musculoskeletal: Normal range of motion.       Upper thoracic muscular ttp  Neurological: She is alert and oriented to person, place, and time.  Skin: Skin is warm and dry. No rash noted. She is not diaphoretic.  Psychiatric: She has a normal mood and affect. Her behavior is normal.    ED Course  Procedures (including critical care time)  Labs Reviewed  BASIC METABOLIC PANEL - Abnormal; Notable for the following:    Potassium 3.2 (*)     Glucose, Bld 105 (*)     GFR calc non Af Amer 83 (*)     All other components within normal limits  CBC  INFLUENZA PANEL BY PCR   Dg Chest 2 View  06/29/2012  *RADIOLOGY REPORT*  Clinical Data: Generalized body aches and weakness.  CHEST - 2 VIEW  Comparison: 11/25/2009.  Findings: The heart is mildly enlarged but stable.  Prominent mediastinal and hilar contours are unchanged.  Low lung volumes with vascular crowding and streaky atelectasis along with mild vascular congestion.  No overt pulmonary edema.  No pleural effusions.  The bony thorax is intact.  IMPRESSION: Mild stable cardiac enlargement with central vascular congestion. No overt pulmonary edema.   Original Report Authenticated By: Rudie Meyer, M.D.      No diagnosis found.    MDM  Patient with symptoms consistent with influenza.  No signs of dehydration, tolerating PO's.  Lungs are clear.  Discussed the cost versus benefit of Tamiflu treatment with the patient.  The patient understands  that symptoms are greater than the recommended 24-48 hour window of treatment, however she still requests treatment and prescription.  Patient will be discharged with instructions to orally hydrate, rest, and use over-the-counter medications such as anti-inflammatories ibuprofen and Aleve for muscle aches and Tylenol for fever.  Patient will also be given a cough suppressant.          Jaci Carrel, [place] 06/29/12 1139

## 2012-06-29 NOTE — ED Provider Notes (Signed)
Medical screening examination/treatment/procedure(s) were performed by non-physician practitioner and as supervising physician I was immediately available for consultation/collaboration.  Tobin Chad, MD 06/29/12 1353

## 2012-06-29 NOTE — ED Notes (Signed)
Sister at bedside.

## 2012-06-29 NOTE — ED Notes (Signed)
Pt c/o multiple body aches and headache x 2 days. Also c/o fever

## 2012-07-15 ENCOUNTER — Encounter: Payer: Self-pay | Admitting: Emergency Medicine

## 2012-07-15 ENCOUNTER — Ambulatory Visit (INDEPENDENT_AMBULATORY_CARE_PROVIDER_SITE_OTHER): Payer: Medicaid Other | Admitting: Emergency Medicine

## 2012-07-15 VITALS — BP 139/80 | HR 66 | Ht 65.0 in | Wt 317.0 lb

## 2012-07-15 DIAGNOSIS — E785 Hyperlipidemia, unspecified: Secondary | ICD-10-CM

## 2012-07-15 DIAGNOSIS — I10 Essential (primary) hypertension: Secondary | ICD-10-CM

## 2012-07-15 DIAGNOSIS — G4700 Insomnia, unspecified: Secondary | ICD-10-CM

## 2012-07-15 DIAGNOSIS — Z0000 Encounter for general adult medical examination without abnormal findings: Secondary | ICD-10-CM | POA: Insufficient documentation

## 2012-07-15 DIAGNOSIS — Z113 Encounter for screening for infections with a predominantly sexual mode of transmission: Secondary | ICD-10-CM

## 2012-07-15 DIAGNOSIS — G43909 Migraine, unspecified, not intractable, without status migrainosus: Secondary | ICD-10-CM

## 2012-07-15 DIAGNOSIS — R002 Palpitations: Secondary | ICD-10-CM

## 2012-07-15 DIAGNOSIS — Z975 Presence of (intrauterine) contraceptive device: Secondary | ICD-10-CM

## 2012-07-15 LAB — HIV ANTIBODY (ROUTINE TESTING W REFLEX): HIV: NONREACTIVE

## 2012-07-15 LAB — LIPID PANEL
LDL Cholesterol: 111 mg/dL — ABNORMAL HIGH (ref 0–99)
Triglycerides: 188 mg/dL — ABNORMAL HIGH (ref <150)
VLDL: 38 mg/dL (ref 0–40)

## 2012-07-15 MED ORDER — PROCHLORPERAZINE MALEATE 10 MG PO TABS: 10.0000 mg | ORAL_TABLET | Freq: Three times a day (TID) | ORAL | Status: DC | PRN

## 2012-07-15 MED ORDER — NORTRIPTYLINE HCL 25 MG PO CAPS: 25.0000 mg | ORAL_CAPSULE | Freq: Every day | ORAL | Status: DC

## 2012-07-15 MED ORDER — OMEPRAZOLE 20 MG PO CPDR: 20.0000 mg | DELAYED_RELEASE_CAPSULE | Freq: Every day | ORAL | Status: DC | PRN

## 2012-07-15 NOTE — Assessment & Plan Note (Signed)
Doing well overall.  Pap due next year.  Discussed weight loss briefly.  Will screen for HIV at patient's request.

## 2012-07-15 NOTE — Progress Notes (Signed)
  Subjective:    Patient ID: Danielle Shaw, female    DOB: [birth year], 41 y.o.   MRN: [id number]  HPI Danielle Shaw is here for a CPE.  I have reviewed and updated the following as appropriate: allergies, current medications and problem list SHx: never smoker  Hypertension Well controlled: yes Compliant with medication: yes Side effects from medication: no Check BP at home: no  Chest pain: yes - present with deep breath only, non-exertional, did have flu 2 weeks ago with cough and vomiting Palpitations: yes - previously evaluated by cardiology, present mostly at rest or with routine chores, last seconds to minutes, can occur 0-5 times a day, no associated dizziness, SOB or CP Vision changes: no Leg edema: no Dizziness: no  Hyperlipidemia Well controlled: no  Compliant with medication: yes Side effects form medication: no  Migraines Occur 4/7 days.  Compazine provides relief sometimes.  Review of Systems Patient Information Form: Screening and ROS  Do you feel safe in relationships? yes PHQ-2:negative  Review of Symptoms  General:  Negative for nexplained weight loss, fever Skin: Negative for new or changing mole, sore that won't heal HEENT: Negative for trouble hearing, trouble seeing, ringing in ears, mouth sores, hoarseness, change in voice, dysphagia. CV:  Negative for +chest pain, dyspnea, edema, +palpitations Resp: Negative for +cough, dyspnea, hemoptysis GI: Negative for nausea, vomiting, diarrhea, constipation, abdominal pain, melena, hematochezia. GU: Negative for dysuria, incontinence, urinary hesitance, hematuria, vaginal or penile discharge, polyuria, sexual difficulty, lumps in testicle or breasts MSK: Negative for muscle cramps or aches, joint pain or swelling Neuro: Negative for headaches, weakness, numbness, dizziness, passing out/fainting Psych: Negative for depression, anxiety, memory problems, +stress     Objective:   Physical Exam BP 139/80   Pulse 66  Ht 5\' 5"  (1.651 m)  Wt 317 lb (143.79 kg)  BMI 52.75 kg/m2 Gen: alert, cooperative, NAD HEENT: AT/[place], sclera white, PERRL MMM, pharyngeal erythema or exudate Neck: supple, no LAD CV: RRR, no murmurs Pulm: CTAB, no wheezes or rales Abd: +BS, soft, NTND Ext: no edema, 2+ DP pulses bilaterally Pelvic: strings visualized within cervical os, no CMT     Assessment & Plan:

## 2012-07-15 NOTE — Assessment & Plan Note (Addendum)
Increased frequency of palpitations recently.  May be due to recent influenza, obesity, poor sleep habits.  These were evaluated by cardiology last year and monitor showed PACs, PVCs and one brief run of A fib.  History seems most consistent with PACs/PVCs.  Offered cardiology re-referral.  At this time, the patient will monitor at home.  If they persist into the new year, will consider referral at that time.  Will check EKG in January.

## 2012-07-15 NOTE — Assessment & Plan Note (Signed)
Started on Crestor 20 mg about 6 months ago. Will check lipid panel today.

## 2012-07-15 NOTE — Patient Instructions (Addendum)
It was good to see you! Migraines - start Nortriptyline 25mg  at bedtime, after 1 week increase to 50mg  at bedtime. Palpitations - continue to watch for now.   - If you decide to want to go back to cardiology, let me know and I will put in the referral.  I will send a letter with your lab results.  I will call if anything is wrong. Follow up in 1 month to see how things are going and check an EKG.

## 2012-07-15 NOTE — Assessment & Plan Note (Signed)
Well controlled. Continue current medications  

## 2012-07-15 NOTE — Assessment & Plan Note (Signed)
Strings visualized in the cervical os today.  Due for replacement next year.

## 2012-07-15 NOTE — Assessment & Plan Note (Signed)
Had been doing well, but got off track.  Discussed sleep hygiene.  Continue trazodone as needed.

## 2012-07-15 NOTE — Assessment & Plan Note (Signed)
Poorly controlled.  Compazine provides some relief acutely.  On propranolol.  Will start nortriptyline 25mg  qHS, increase to 50mg  after 1 week.  Pt to return in 1 month for EKG to evaluate QT interval given that she is on celexa as well.

## 2012-07-18 ENCOUNTER — Encounter: Payer: Self-pay | Admitting: Emergency Medicine

## 2012-08-12 ENCOUNTER — Ambulatory Visit: Payer: Medicaid Other | Admitting: Emergency Medicine

## 2012-09-08 ENCOUNTER — Ambulatory Visit: Payer: Medicaid Other | Admitting: Emergency Medicine

## 2012-09-19 ENCOUNTER — Other Ambulatory Visit: Payer: Self-pay | Admitting: *Deleted

## 2012-09-19 MED ORDER — TRAMADOL HCL 50 MG PO TABS: 50.0000 mg | ORAL_TABLET | Freq: Three times a day (TID) | ORAL | Status: DC | PRN

## 2012-10-04 ENCOUNTER — Encounter: Payer: Self-pay | Admitting: Emergency Medicine

## 2012-10-04 ENCOUNTER — Ambulatory Visit (INDEPENDENT_AMBULATORY_CARE_PROVIDER_SITE_OTHER): Payer: Medicaid Other | Admitting: Emergency Medicine

## 2012-10-04 VITALS — BP 133/90 | HR 62 | Temp 97.8°F | Ht 65.0 in | Wt 327.0 lb

## 2012-10-04 DIAGNOSIS — G43909 Migraine, unspecified, not intractable, without status migrainosus: Secondary | ICD-10-CM

## 2012-10-04 DIAGNOSIS — M7989 Other specified soft tissue disorders: Secondary | ICD-10-CM

## 2012-10-04 DIAGNOSIS — K5909 Other constipation: Secondary | ICD-10-CM

## 2012-10-04 LAB — BASIC METABOLIC PANEL WITH GFR
BUN: 10 mg/dL (ref 6–23)
CO2: 30 meq/L (ref 19–32)
Calcium: 9.5 mg/dL (ref 8.4–10.5)
Chloride: 104 meq/L (ref 96–112)
Creat: 0.8 mg/dL (ref 0.50–1.10)
Glucose, Bld: 115 mg/dL — ABNORMAL HIGH (ref 70–99)
Potassium: 3.8 meq/L (ref 3.5–5.3)
Sodium: 139 meq/L (ref 135–145)

## 2012-10-04 LAB — CBC
HCT: 36 % (ref 36.0–46.0)
Hemoglobin: 12.2 g/dL (ref 12.0–15.0)
MCH: 29.4 pg (ref 26.0–34.0)
MCHC: 33.9 g/dL (ref 30.0–36.0)
MCV: 86.7 fL (ref 78.0–100.0)
Platelets: 360 10*3/uL (ref 150–400)
RBC: 4.15 MIL/uL (ref 3.87–5.11)
RDW: 14.7 % (ref 11.5–15.5)
WBC: 5.1 10*3/uL (ref 4.0–10.5)

## 2012-10-04 MED ORDER — FUROSEMIDE 20 MG PO TABS: 20.0000 mg | ORAL_TABLET | Freq: Every day | ORAL | Status: DC | PRN

## 2012-10-04 MED ORDER — TOPIRAMATE 25 MG PO TABS: 25.0000 mg | ORAL_TABLET | Freq: Every day | ORAL | Status: DC

## 2012-10-04 MED ORDER — POLYETHYLENE GLYCOL 3350 17 GM/SCOOP PO POWD: ORAL | Status: DC

## 2012-10-04 NOTE — Progress Notes (Signed)
  Subjective:    Patient ID: Danielle Shaw, female    DOB: June 25, 1971, 42 y.o.   MRN: [id number]  HPI Danielle Shaw is here for f/u and problem with BMs.  Bowel movements She reports a long history of constipation, but this has been getting worse.  Reports severe pain following BMs that will improve, but never completely go away.  Described as pins and needles or burning, radiates into the vagina.  Her last 2 BMs (on Saturday and Monday) required manual stimulation.  Reports very hard stools and sometimes going for 1-2 weeks without a BM.  Feels like she tore something with her last BM; states she can feel a "cut" when she feels around the inside of her rectum.  Does have bright red blood with BMs, sometimes just with wiping other times it will drip into the toilet.  Has been using OTC Preparation H the last week or so.  Using OTC colace daily for last month as well.  Daughter reports that there were inflamed looking hemorrhoids a few days ago.    Migraines Has not been taking the nortriptyline regularly as it makes her feel "high and drugged."  Migraines are a little better, but still occuring several times a week.    Swelling Reports intermittent swelling of her legs.  When it occurs it typically lasts several days and then improves if she stays off of her feet.  She states that she does not cook with much salt and tries to avoid adding it to her food.  I have reviewed and updated the following as appropriate: allergies and current medications SHx: never smoker  Review of Systems See HPI    Objective:   Physical Exam BP 133/90  Pulse 62  Temp(Src) 97.8 F (36.6 C) (Oral)  Ht 5\' 5"  (1.651 m)  Wt 327 lb (148.326 kg)  BMI 54.42 kg/m2 Gen: alert, cooperative, NAD HEENT: AT/[place], sclera white, MMM Neck: supple CV: RRR, no murmurs Pulm: CTAB, no wheezes or rales Abd: +BS, soft, NTND Rectal: several small external hemorrhoids between 12 and 3 o'clock; unable to do internal exam  secondary to pain Ext: trace edema bilaterally to knees, no erythema or tenderness, no palpable cords     Assessment & Plan:

## 2012-10-04 NOTE — Patient Instructions (Signed)
It was nice to see you! -Constipation: take miralax 1-2 times a day until you have 1 soft stool daily.  If you start having diarrhea, you can take the miralax every other day. -Leg swelling: you can take lasix 20mg  daily when you have swelling only.  I would also recommend you get some compression stockings or ted hose at the pharmacy to wear when you get fluid.  Avoid salt. -Migraines: Stop the nortriptyline.  Start Topamax 25mg  daily.  Increase to 50mg  daily after 1 week.  If the medication makes you feel drugged or loopy, stop taking it and let me know.  Follow up in 1 month.

## 2012-10-04 NOTE — Assessment & Plan Note (Signed)
Severe with external hemorrhoids, may have internal tear as well, but unable to assess secondary to pain.  Will start miralax 1-2 times daily as needed for 1 soft stool daily.  Discussed that this may take a week or two to happen given the chronicity of her constipation.  She will follow up in 1 month to see how things are progressing.  May need to be more aggressive with bowel regimen.

## 2012-10-04 NOTE — Assessment & Plan Note (Signed)
Unable to tolerate nortriptyline.  States was on Topamax in the past, but can't remember why it was stopped.  Will start topamax 25mg  daily, increase to 50mg  daily after 1 week.  She will stop the medication and let me know if she has side effects.  Follow up in 1 month.

## 2012-10-04 NOTE — Assessment & Plan Note (Signed)
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  Danielle Shaw has salt component as well, but she seems to be very aware of how much salt she eats.  Will give lasix 20mg  daily prn for swelling.  Discussed that this will work much better in conjunction with compression stockings.  At this time, I think OTC ted hose will be enough compression.  Will check BMP and CBC today.

## 2012-10-06 ENCOUNTER — Encounter: Payer: Self-pay | Admitting: Emergency Medicine

## 2012-10-31 ENCOUNTER — Ambulatory Visit (INDEPENDENT_AMBULATORY_CARE_PROVIDER_SITE_OTHER): Payer: Medicaid Other | Admitting: Emergency Medicine

## 2012-10-31 ENCOUNTER — Encounter: Payer: Self-pay | Admitting: Emergency Medicine

## 2012-10-31 VITALS — BP 136/89 | HR 66 | Ht 65.0 in | Wt 330.0 lb

## 2012-10-31 DIAGNOSIS — G43909 Migraine, unspecified, not intractable, without status migrainosus: Secondary | ICD-10-CM

## 2012-10-31 DIAGNOSIS — K5909 Other constipation: Secondary | ICD-10-CM

## 2012-10-31 MED ORDER — DOCUSATE SODIUM 100 MG PO CAPS: 100.0000 mg | ORAL_CAPSULE | Freq: Every day | ORAL | Status: DC

## 2012-10-31 MED ORDER — TOPIRAMATE 50 MG PO TABS: 50.0000 mg | ORAL_TABLET | Freq: Every day | ORAL | Status: DC

## 2012-10-31 NOTE — Assessment & Plan Note (Signed)
Tolerating topamax 25mg  daily well.  Unclear if any improvement in headaches.  Will increase to 50mg  daily.

## 2012-10-31 NOTE — Progress Notes (Signed)
  Subjective:    Patient ID: Danielle Shaw, female    DOB: 1971/01/02, 42 y.o.   MRN: [id number]  HPI Danielle Shaw is here for f/u of constipation and migraines.  1. Constipation: Much improved since starting Miralax.  Currently with a BM every 4-5 days, not hard, no straining, no pain with BM.  Taking miralax 1-2x per week.   Has had 2 episodes of bright red blood per rectum since I last saw her, but much less than previously.  Denies diarrhea or loose stools.  2. Migraines: Continue to have frequent headaches, unsure if they are better or not.   Tolerating topomax 25mg  daily well.  I have reviewed and updated the following as appropriate: allergies and current medications SHx: never smoker    Review of Systems See HPI    Objective:   Physical Exam BP 136/89  Pulse 66  Ht 5\' 5"  (1.651 m)  Wt 330 lb (149.687 kg)  BMI 54.91 kg/m2 Gen: alert, coopertative, nAD Abd: +BS, soft, NTND     Assessment & Plan:

## 2012-10-31 NOTE — Assessment & Plan Note (Signed)
Much improved.  Recommended miralax 3x a week.  Will start colace now that constipation is a little better.  Discussed that goal would be a bowel movement every 1-3 days.

## 2012-10-31 NOTE — Patient Instructions (Addendum)
It was nice to see you! I'm glad the constipation is getting better.  Try and take the miralax 3x week. Start colace 1 capsule daily. The goal is for a bowel movement every 1-3 days. Increase topomax to 50mg  daily.  You can take 2 pills daily until you run out, then the refill should be a 50mg  tablet. Follow up in 2 months or sooner as needed.

## 2012-12-22 ENCOUNTER — Ambulatory Visit (INDEPENDENT_AMBULATORY_CARE_PROVIDER_SITE_OTHER): Payer: Medicaid Other | Admitting: Family Medicine

## 2012-12-22 VITALS — BP 149/93 | HR 81 | Temp 98.3°F | Wt 331.0 lb

## 2012-12-22 DIAGNOSIS — J029 Acute pharyngitis, unspecified: Secondary | ICD-10-CM

## 2012-12-22 MED ORDER — AMOXICILLIN 500 MG PO CAPS: 500.0000 mg | ORAL_CAPSULE | Freq: Two times a day (BID) | ORAL | Status: DC

## 2012-12-22 NOTE — Progress Notes (Signed)
  Subjective:    Patient ID: Danielle Shaw, female    DOB: 1971/03/27, 42 y.o.   MRN: [id number]  HPI # SDA Sore throat, drainage in ears and nose, sometimes ears ring and itch really badly, cough so bad that it hurts up and down her back side like she is having muscle spasm, headache yesterday and slight one this morning behind her eyes  Duration? 2.5 days Progression? Getting a little worse  Medication: -Mucinex -Claritin daily does not help  -Tylenol Sick contacts?    Daughter here with similar symptoms; occurred same time   She does work, stays at home  Review of Systems Denies wheezing, fevers, diarrhea, nausea/vomiting Endorses chills  Allergies, medication, past medical history reviewed.  Smoking status noted. No smoking     Objective:   Physical Exam GEN: appears uncomfortable; obese HEENT:   Head: [place]; mild tenderness along maxillary sinuses bilaterally   Eyes: normal conjunctiva without injection or tearing   Ears: TM clear bilaterally with good light reflex and without erythema or air-fluid level   Nose: nasal congestion   Mouth: MMM; no tonsillar adenopathy; no oropharyngeal erythema NECK: no palpable LN but fullness lower mandible CV: RRR PULM: NI WOB; CTAB without w/r/r ABD: soft, NT SKIN: no rash     Assessment & Plan:

## 2012-12-22 NOTE — Patient Instructions (Addendum)
I think you have a viral infection  I would recommend fluids, Tylenol/ibuprofen (600-800 mg every 6 hours as needed for pain or fever) and monitoring of symptoms  Continue allergy medication  If you develop worsening sore throat or fevers or persistent symptoms after 10 days, you may take antibiotics, otherwise I would not take it.

## 2012-12-23 DIAGNOSIS — J029 Acute pharyngitis, unspecified: Secondary | ICD-10-CM | POA: Insufficient documentation

## 2012-12-23 NOTE — Assessment & Plan Note (Signed)
Sore throat, drainage, headache. Most likely viral URI. Negative rapid strep although daughter here today as well positive.  -Recommend symptomatic treatment; see AVS  -Rx for amoxicillin given to take if symptoms worsen in next 5 days or do not improve in next week

## 2013-04-06 ENCOUNTER — Other Ambulatory Visit: Payer: Self-pay | Admitting: Emergency Medicine

## 2013-04-06 MED ORDER — TRAMADOL HCL 50 MG PO TABS: 50.0000 mg | ORAL_TABLET | Freq: Three times a day (TID) | ORAL | Status: DC | PRN

## 2013-04-06 MED ORDER — ROSUVASTATIN CALCIUM 20 MG PO TABS: 20.0000 mg | ORAL_TABLET | Freq: Every day | ORAL | Status: DC

## 2013-04-06 NOTE — Telephone Encounter (Signed)
PA form for Crestor completed and faxed.

## 2013-04-11 ENCOUNTER — Other Ambulatory Visit: Payer: Self-pay | Admitting: Emergency Medicine

## 2013-04-11 MED ORDER — LORATADINE 10 MG PO TABS: 10.0000 mg | ORAL_TABLET | Freq: Every day | ORAL | Status: DC

## 2013-04-11 MED ORDER — LOSARTAN POTASSIUM-HCTZ 50-12.5 MG PO TABS: 1.0000 | ORAL_TABLET | Freq: Every day | ORAL | Status: DC

## 2013-04-11 MED ORDER — TRAZODONE HCL 50 MG PO TABS: 25.0000 mg | ORAL_TABLET | Freq: Every evening | ORAL | Status: DC | PRN

## 2013-04-11 MED ORDER — PROPRANOLOL HCL 20 MG PO TABS: 20.0000 mg | ORAL_TABLET | Freq: Two times a day (BID) | ORAL | Status: DC

## 2013-04-27 ENCOUNTER — Encounter: Payer: Self-pay | Admitting: Emergency Medicine

## 2013-04-27 ENCOUNTER — Ambulatory Visit (INDEPENDENT_AMBULATORY_CARE_PROVIDER_SITE_OTHER): Payer: Medicaid Other | Admitting: Emergency Medicine

## 2013-04-27 VITALS — BP 150/94 | HR 96 | Ht 65.0 in | Wt 321.9 lb

## 2013-04-27 DIAGNOSIS — M25561 Pain in right knee: Secondary | ICD-10-CM | POA: Insufficient documentation

## 2013-04-27 DIAGNOSIS — J019 Acute sinusitis, unspecified: Secondary | ICD-10-CM | POA: Insufficient documentation

## 2013-04-27 DIAGNOSIS — M25569 Pain in unspecified knee: Secondary | ICD-10-CM

## 2013-04-27 DIAGNOSIS — Z975 Presence of (intrauterine) contraceptive device: Secondary | ICD-10-CM

## 2013-04-27 DIAGNOSIS — G43909 Migraine, unspecified, not intractable, without status migrainosus: Secondary | ICD-10-CM

## 2013-04-27 DIAGNOSIS — M7989 Other specified soft tissue disorders: Secondary | ICD-10-CM

## 2013-04-27 DIAGNOSIS — I10 Essential (primary) hypertension: Secondary | ICD-10-CM

## 2013-04-27 DIAGNOSIS — R0602 Shortness of breath: Secondary | ICD-10-CM | POA: Insufficient documentation

## 2013-04-27 LAB — CBC
HCT: 37.1 % (ref 36.0–46.0)
Hemoglobin: 12.6 g/dL (ref 12.0–15.0)
MCHC: 34 g/dL (ref 30.0–36.0)
RDW: 14.7 % (ref 11.5–15.5)
WBC: 4.9 10*3/uL (ref 4.0–10.5)

## 2013-04-27 MED ORDER — TOPIRAMATE 50 MG PO TABS: 50.0000 mg | ORAL_TABLET | Freq: Every day | ORAL | Status: DC

## 2013-04-27 MED ORDER — LORATADINE 10 MG PO TABS: 10.0000 mg | ORAL_TABLET | Freq: Every day | ORAL | Status: DC

## 2013-04-27 MED ORDER — IBUPROFEN 800 MG PO TABS: 800.0000 mg | ORAL_TABLET | Freq: Three times a day (TID) | ORAL | Status: DC | PRN

## 2013-04-27 MED ORDER — PROCHLORPERAZINE MALEATE 10 MG PO TABS: 10.0000 mg | ORAL_TABLET | Freq: Three times a day (TID) | ORAL | Status: DC | PRN

## 2013-04-27 MED ORDER — OMEPRAZOLE 20 MG PO CPDR: 20.0000 mg | DELAYED_RELEASE_CAPSULE | Freq: Every day | ORAL | Status: DC | PRN

## 2013-04-27 MED ORDER — FUROSEMIDE 20 MG PO TABS: 20.0000 mg | ORAL_TABLET | Freq: Every day | ORAL | Status: DC | PRN

## 2013-04-27 MED ORDER — LOSARTAN POTASSIUM-HCTZ 50-12.5 MG PO TABS: 1.0000 | ORAL_TABLET | Freq: Every day | ORAL | Status: DC

## 2013-04-27 MED ORDER — AMOXICILLIN-POT CLAVULANATE 875-125 MG PO TABS: 1.0000 | ORAL_TABLET | Freq: Two times a day (BID) | ORAL | Status: DC

## 2013-04-27 MED ORDER — PROPRANOLOL HCL 20 MG PO TABS: 20.0000 mg | ORAL_TABLET | Freq: Two times a day (BID) | ORAL | Status: DC

## 2013-04-27 MED ORDER — ROSUVASTATIN CALCIUM 20 MG PO TABS: 20.0000 mg | ORAL_TABLET | Freq: Every day | ORAL | Status: DC

## 2013-04-27 MED ORDER — TRAZODONE HCL 50 MG PO TABS: 25.0000 mg | ORAL_TABLET | Freq: Every evening | ORAL | Status: DC | PRN

## 2013-04-27 NOTE — Assessment & Plan Note (Signed)
OHS vs new-CHF.   Suspect OHS given her body habitus. Given orthopnea and leg edema, will check labs including a BNP.

## 2013-04-27 NOTE — Progress Notes (Signed)
  Subjective:    Patient ID: Danielle Shaw, female    DOB: 1971/07/23, 42 y.o.   MRN: [id number]  HPI SYLVI RYBOLT is here for right knee pain.  Hypertension Compliant with medication: no - out of all meds x2 weeks. Side effects from medication: no Check BP at home: no  Chest pain: no Palpitations: yes - out of propranolol for 2 weeks; has been worked up in the past Vision changes: no Leg edema: yes - intermittent Dizziness: no Reports some shortness of breath, especially with lying flat.  Right knee pain Present for the last 2-3 works; through knee but mostly anterior.  Worse with stairs.  Popping and stiffness, especially after sitting for awhile.  No injury or trauma.  Does have some intermittent swelling.    Sinus congestion Reports having left nasal congestion with purulent discharge for the last month.  No fevers.   I have reviewed and updated the following as appropriate: allergies and current medications SHx: non smoker   Review of Systems See HPI    Objective:   Physical Exam BP 150/94  Pulse 96  Ht 5\' 5"  (1.651 m)  Wt 321 lb 14.4 oz (146.013 kg)  BMI 53.57 kg/m2  LMP 02/25/2013 Gen: alert, cooperative, NAD, morbidly obese HEENT: AT/[place], sclera white, MMM, R TM normal; L TM retracted; purulent discharge in left nares; left maxillary sinus tenderness Neck: supple CV: RRR, no murmurs Pulm: CTAB, no wheezes or rales Ext: 1-2+ edema to knees bilaterally, 2+ DP pulses bilaterally Right knee: no erythema or deformity; no effusion; + crepitus; full range of motion; negative mcmurrays     Assessment & Plan:

## 2013-04-27 NOTE — Assessment & Plan Note (Signed)
Restart home losartan-HCTZ. Check CMP today.  Will also check CBC and pro-BNP given her report of orthopnea. Will recheck BP at appt for IUD exchange.

## 2013-04-27 NOTE — Assessment & Plan Note (Signed)
Pain and purulent drainage x1 month. Will treat with augmentin BID x10 days. Nasal saline spray 2-3 times a day.

## 2013-04-27 NOTE — Assessment & Plan Note (Signed)
Patient to schedule appt for removal and re-insertion in the next month or so.

## 2013-04-27 NOTE — Patient Instructions (Addendum)
It was nice to see you!  Take Augmentin 1 pill twice a day for 10 days for a sinus infection. Use nasal saline spray 2-3 times a day to wash out the sinuses.  Restart all your medicines.  Get an [facility] of [facility] at [facility]. Take ibuprofen 800mg  up to 3 times a day for pain.  Make an appointment to change your IUD and possible do a knee injection.

## 2013-04-27 NOTE — Assessment & Plan Note (Signed)
Suspect OA. Will get plain films. Ibuprofen 800mg  TID prn. Discussed weight loss. Consider steroid injection if x-ray appropriate.

## 2013-04-28 LAB — COMPREHENSIVE METABOLIC PANEL
AST: 14 U/L (ref 0–37)
Albumin: 4 g/dL (ref 3.5–5.2)
BUN: 11 mg/dL (ref 6–23)
CO2: 28 mEq/L (ref 19–32)
Calcium: 9.1 mg/dL (ref 8.4–10.5)
Chloride: 102 mEq/L (ref 96–112)
Creat: 0.71 mg/dL (ref 0.50–1.10)
Potassium: 3.4 mEq/L — ABNORMAL LOW (ref 3.5–5.3)

## 2013-05-04 ENCOUNTER — Ambulatory Visit (HOSPITAL_COMMUNITY)
Admission: RE | Admit: 2013-05-04 | Discharge: 2013-05-04 | Disposition: A | Discharge status: Home / Self Care | Payer: Medicaid Other | Source: Ambulatory Visit | Attending: Family Medicine | Admitting: Family Medicine

## 2013-05-04 DIAGNOSIS — M25569 Pain in unspecified knee: Secondary | ICD-10-CM | POA: Insufficient documentation

## 2013-05-04 DIAGNOSIS — M25561 Pain in right knee: Secondary | ICD-10-CM

## 2013-05-04 IMAGING — CR DG KNEE COMPLETE 4+V*R*
4 series · 4 of 4 positions shown · non-contrast
Comparison: None.

CLINICAL DATA: Pain for 3 weeks with weight-bearing.

EXAM:
RIGHT KNEE - COMPLETE 4+ VIEW

[t knee ap right]
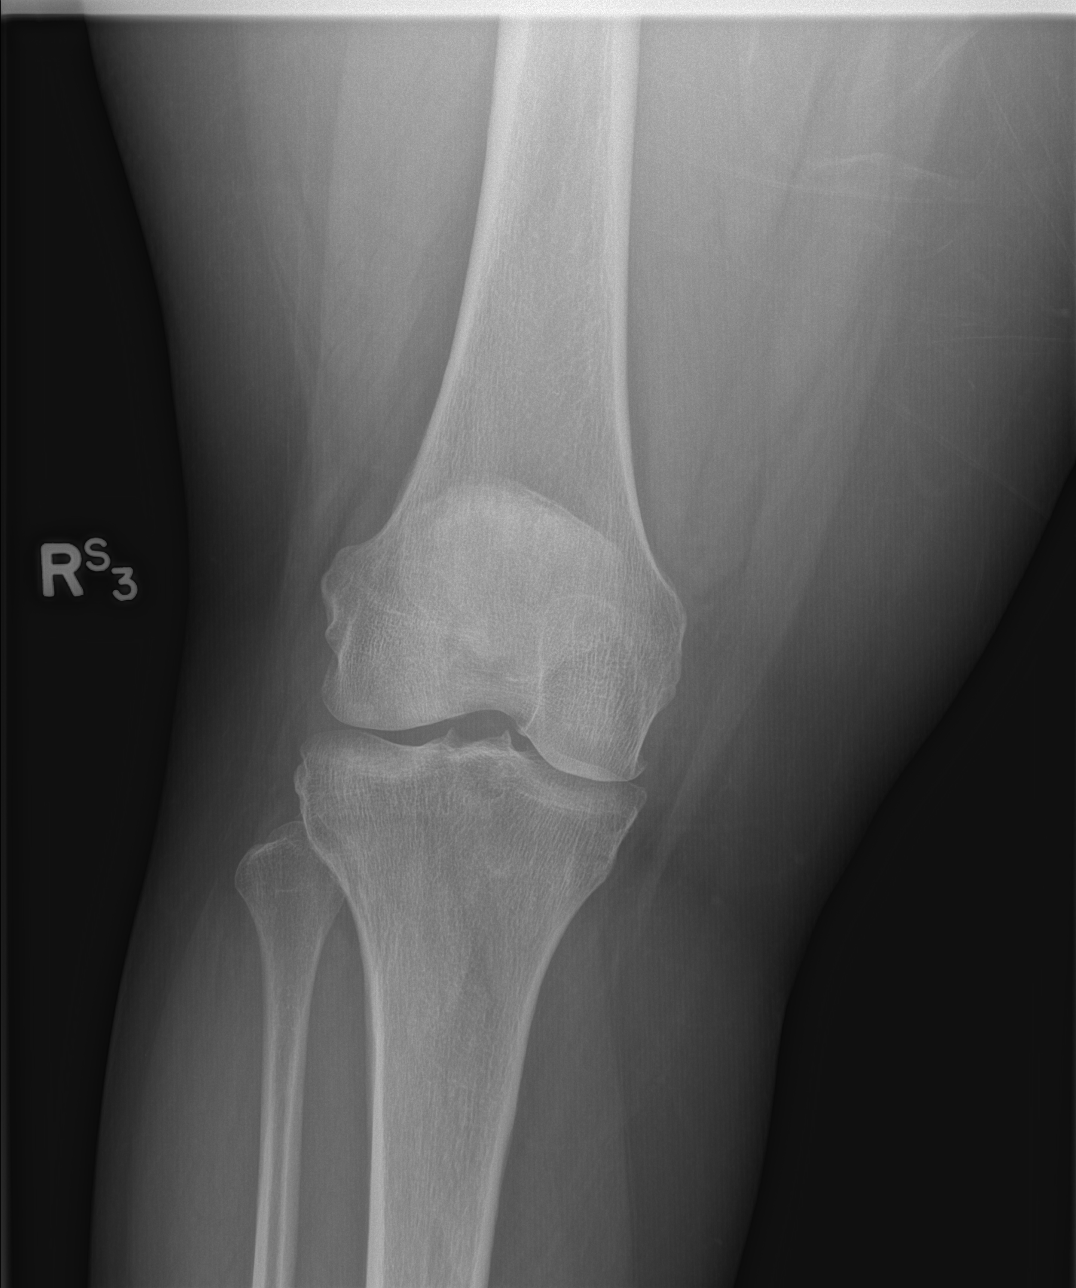

[t knee obl right (1 of 2)]
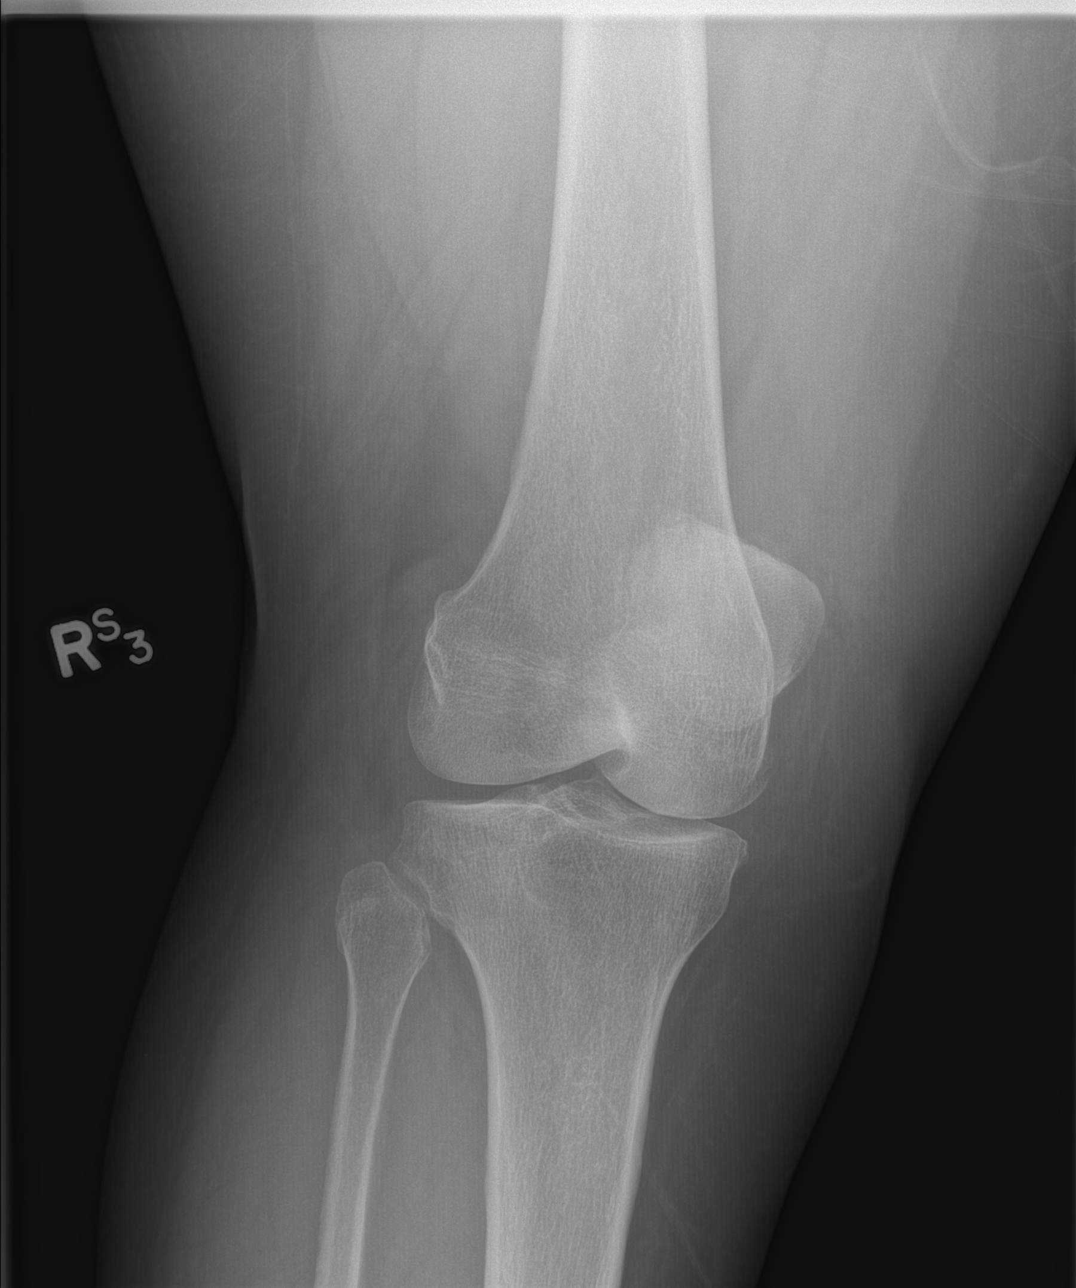

[t knee obl right (2 of 2)]
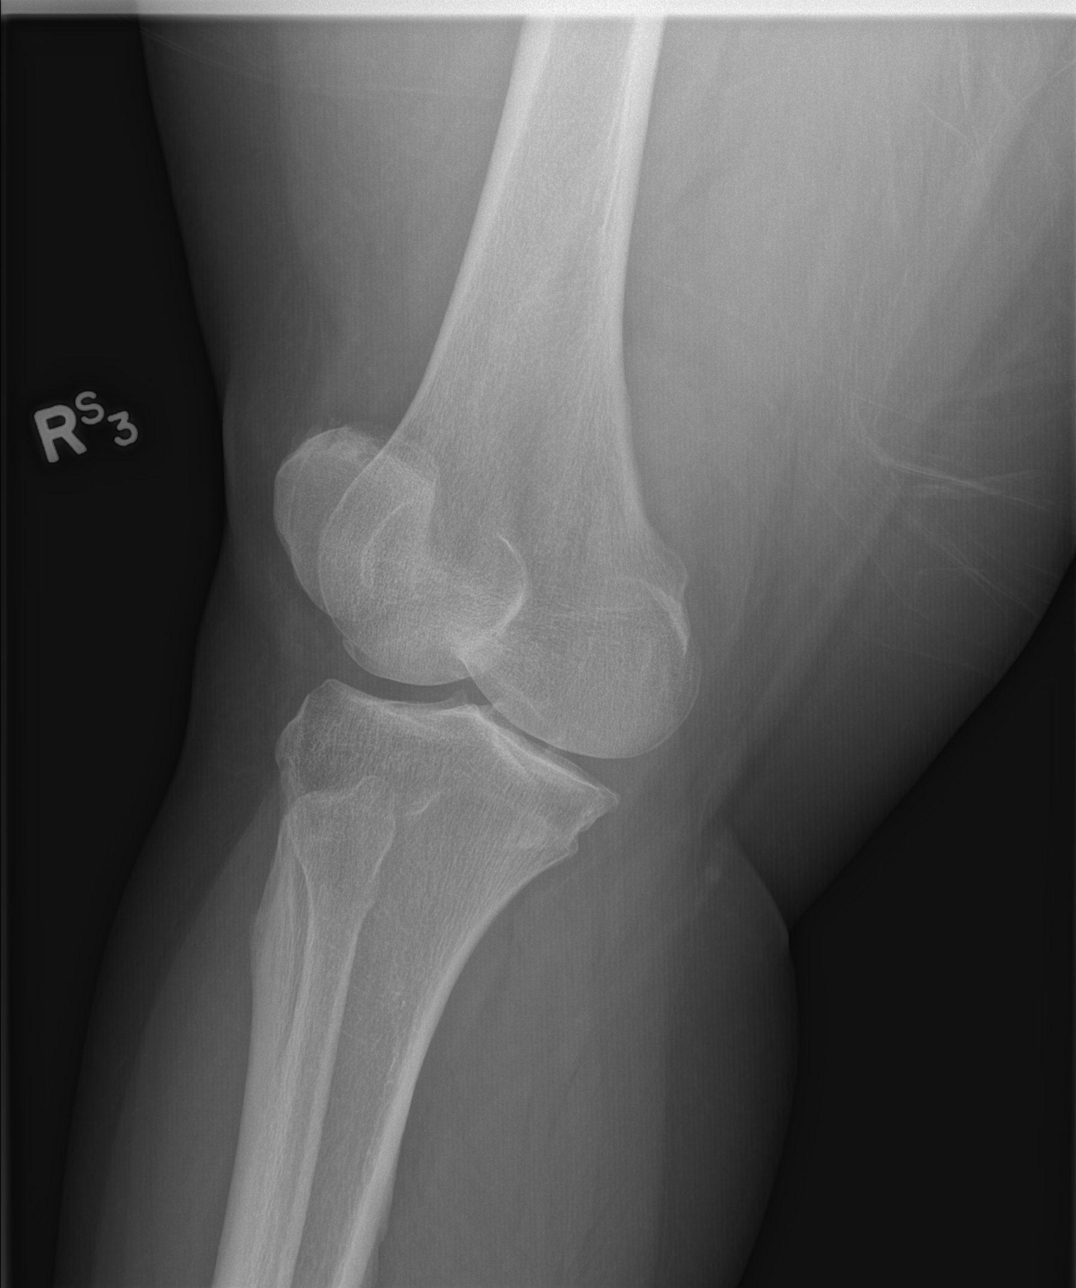

[t knee lat right]
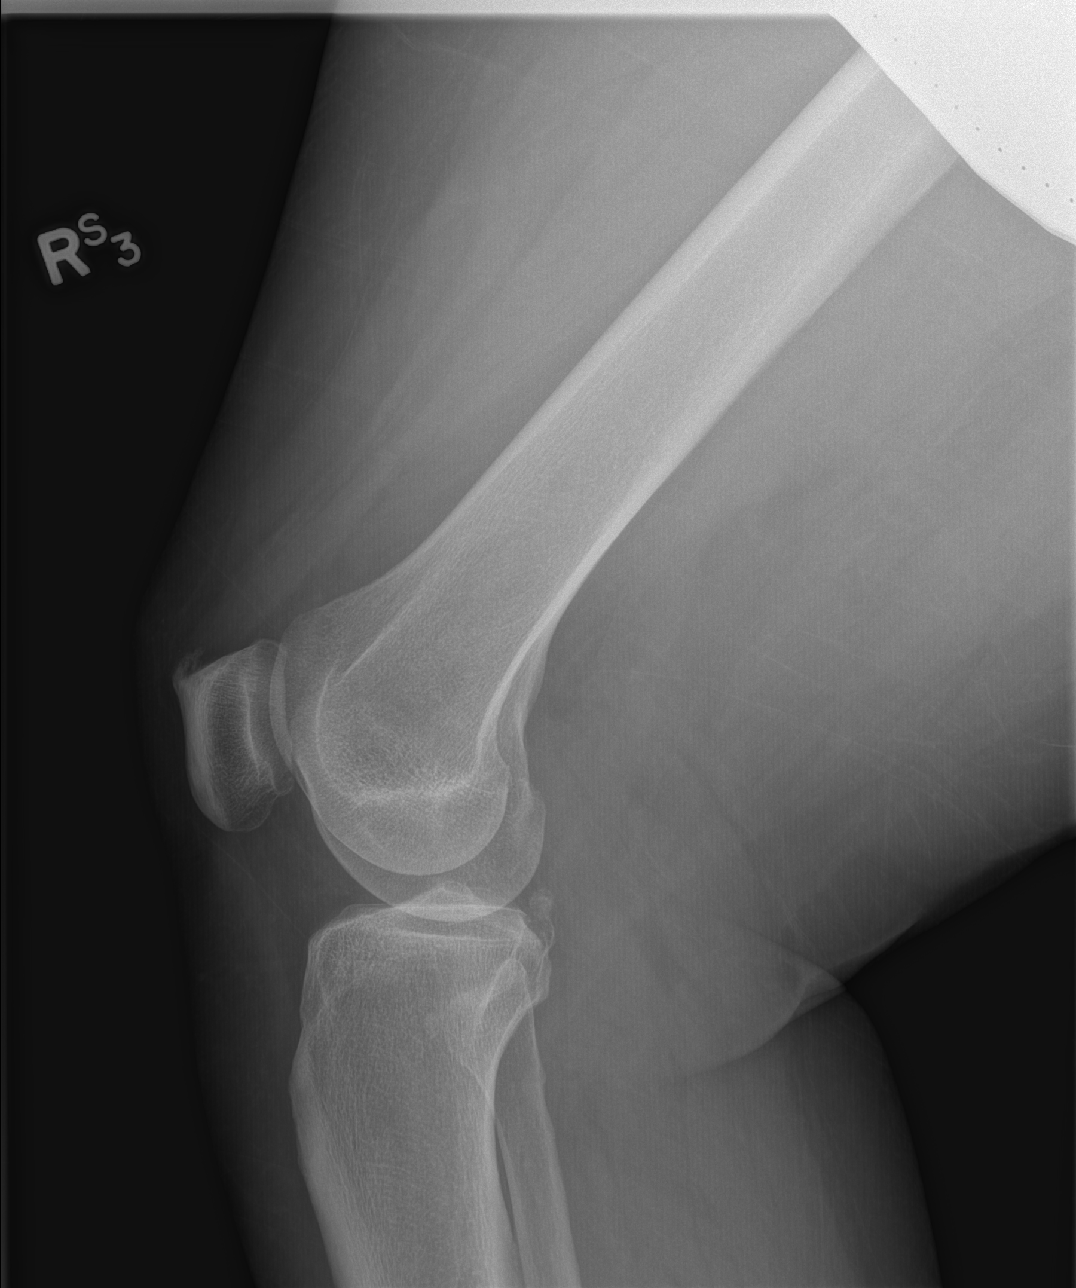

[4 of 4 positions shown; findings below may reference images not displayed]

FINDINGS: Mild medial tibiofemoral joint space degenerative changes. Minimal
patellofemoral joint degenerative changes.

No fracture or dislocation.

No plain film evidence of joint effusion.
IMPRESSION: Mild medial tibiofemoral joint space degenerative changes. Minimal
patellofemoral joint degenerative changes.

## 2013-05-17 ENCOUNTER — Encounter: Payer: Self-pay | Admitting: Emergency Medicine

## 2013-05-17 ENCOUNTER — Ambulatory Visit (INDEPENDENT_AMBULATORY_CARE_PROVIDER_SITE_OTHER): Payer: Medicaid Other | Admitting: Emergency Medicine

## 2013-05-17 ENCOUNTER — Other Ambulatory Visit (HOSPITAL_COMMUNITY)
Admission: RE | Admit: 2013-05-17 | Discharge: 2013-05-17 | Disposition: A | Discharge status: Home / Self Care | Payer: Medicaid Other | Source: Ambulatory Visit | Attending: Emergency Medicine | Admitting: Emergency Medicine

## 2013-05-17 VITALS — BP 136/87 | HR 62 | Temp 97.2°F | Ht 65.0 in | Wt 316.0 lb

## 2013-05-17 DIAGNOSIS — Z124 Encounter for screening for malignant neoplasm of cervix: Secondary | ICD-10-CM

## 2013-05-17 DIAGNOSIS — M25561 Pain in right knee: Secondary | ICD-10-CM

## 2013-05-17 DIAGNOSIS — Z1151 Encounter for screening for human papillomavirus (HPV): Secondary | ICD-10-CM | POA: Insufficient documentation

## 2013-05-17 DIAGNOSIS — Z309 Encounter for contraceptive management, unspecified: Secondary | ICD-10-CM

## 2013-05-17 DIAGNOSIS — Z975 Presence of (intrauterine) contraceptive device: Secondary | ICD-10-CM

## 2013-05-17 DIAGNOSIS — Z01419 Encounter for gynecological examination (general) (routine) without abnormal findings: Secondary | ICD-10-CM | POA: Insufficient documentation

## 2013-05-17 DIAGNOSIS — M25569 Pain in unspecified knee: Secondary | ICD-10-CM

## 2013-05-17 MED ORDER — METHYLPREDNISOLONE ACETATE 40 MG/ML IJ SUSP
40.0000 mg | Freq: Once | INTRAMUSCULAR | Status: AC
  Administered 2013-05-17: 40 mg via INTRAMUSCULAR

## 2013-05-17 NOTE — Patient Instructions (Signed)
It was nice to see you!  We changed your IUD today.  I also put a steroid injection in your right knee. Take it easy today.  Ice the knee for 20 minutes tonight. If you see signs of infection - swelling or redness - please let me know.  Follow up in 3 months.  Intrauterine Device Insertion Care After Refer to this sheet in the next few weeks. These instructions provide you with information on caring for yourself after your procedure. Your caregiver may also give you more specific instructions. Your treatment has been planned according to current medical practices, but problems sometimes occur. Call your caregiver if you have any problems or questions after your procedure. HOME CARE INSTRUCTIONS   Only take over-the-counter or prescription medicines for pain, discomfort, or fever as directed by your caregiver. Do not use aspirin. This may increase bleeding.  Check your IUD to make sure it is in place before you resume sexual activity. You should be able to feel the strings. If you cannot feel the strings, something may be wrong. The IUD may have fallen out of the uterus, or the uterus may have been punctured (perforated) during placement. Also, if the strings are getting longer, it may mean that the IUD is being forced out of the uterus. You no longer have full protection from pregnancy if any of these problems occur.  You may resume sexual intercourse if you are not having problems with the IUD. The IUD is considered immediately effective.  You may resume normal activities.  Keep all follow-up appointments to be sure your IUD has remained in place. After the first exam, yearly exams are advised, unless you cannot feel the strings of your IUD.  Continue to check that the IUD is still in place by feeling for the strings after every menstrual period. SEEK MEDICAL CARE IF:   You have bleeding that is heavier or lasts longer than a normal menstrual cycle.  You have a fever.  You have  increasing cramps or abdominal pain not relieved with medicine.  You have abdominal pain that does not seem to be related to the same area of earlier cramping and pain.  You are lightheaded, unusually weak, or faint.  You have abnormal vaginal discharge or smells.  You have pain during sexual intercourse.  You cannot feel the IUD strings, or the IUD string has gotten longer.  You feel the IUD at the opening of the cervix in the vagina.  You think you are pregnant, or you miss your menstrual period.  The IUD string is hurting your sex partner. Document Released: 03/11/2011 Document Revised: 10/05/2011 Document Reviewed: 03/11/2011 [facility] Patient Information 2014 [place], [place].

## 2013-05-17 NOTE — Assessment & Plan Note (Signed)
Mirena replaced today.

## 2013-05-17 NOTE — Assessment & Plan Note (Signed)
X-rays consistent with early OA. Steroid injection today. Instructions given.

## 2013-05-17 NOTE — Progress Notes (Signed)
  Subjective:    Patient ID: Danielle Shaw, female    DOB: 02-01-1971, 42 y.o.   MRN: [id number]  HPI Danielle Shaw is here for IUD change and right knee injection.  IUD change She currently has a mirena in place.  Placed 04/2008.  She has intermittent bleeding with the mirena.  She is sexually active with one partner.  No new partners.  Does not use condoms.  No vaginal discharge or discomfort.  Right knee pain She states the knee pain is about the same.  Gets some shooting pains up her medial thigh occasionally.  Has noticed a nodule by her kneecap.  No swelling or redness.  Reviewed x-ray results with the patient.  I have reviewed and updated the following as appropriate: allergies and current medications SHx: never smoker  Review of Systems See HPI    Objective:   Physical Exam BP 136/87  Pulse 62  Temp(Src) 97.2 F (36.2 C) (Oral)  Ht 5\' 5"  (1.651 m)  Wt 316 lb (143.337 kg)  BMI 52.59 kg/m2  LMP 04/27/2013 Gen: alert, cooperative, NAD Pelvic: normal external genitalia; has a skin tag on left posterior labia; normal vagina and cervix; strings found in the os. Right knee: no redness or obvious deformity; no effusion, 4mm cystic nodule at 11 o'clock of the kneecap     Assessment & Plan:  IUD INSERTION: Patient given informed consent, signed copy in the chart.  Appropriate time out taken.   Sterile instruments and technique was used. Cervix brought into view with use of speculum.  The IUD strings were visualized and the old IUD was removed.  The cervix was then cleansed three times with  betadine swabs.  A tenaculum was placed into the anterior lip of the cervix and a uterine sound was used to measure uterine size.   A Mirena IUD was placed into the endometrial cavity, deployed and secured. The applicator was removed. The strings were trimmed to 2 centimeters.   There were no complications and the patient tolerated the procedure well.   She was given handouts for post  procedure instructions and information about the IUD including a card with the time of recommended removal.  KNEE INJECTION Informed consent was obtained.  Appropriate time out was taken.  The skin was prepped with betadine and alcohol.  Freeze spray was used for anesthetic.  A steroid injection was performed at the right knee, anterolateral approach using 1% plain Lidocaine and 40 mg of depomedrol. This was well tolerated.

## 2013-05-18 MED ORDER — LEVONORGESTREL 20 MCG/24HR IU IUD
INTRAUTERINE_SYSTEM | Freq: Once | INTRAUTERINE | Status: AC
  Administered 2013-05-17: 1 via INTRAUTERINE

## 2013-05-18 NOTE — Addendum Note (Signed)
Addended by: Jone Baseman D on: 05/18/2013 02:23 PM   Modules accepted: Orders

## 2013-05-23 ENCOUNTER — Encounter: Payer: Self-pay | Admitting: Emergency Medicine

## 2013-05-23 ENCOUNTER — Other Ambulatory Visit (HOSPITAL_COMMUNITY)
Admission: RE | Admit: 2013-05-23 | Discharge: 2013-05-23 | Disposition: A | Discharge status: Home / Self Care | Payer: Medicaid Other | Source: Ambulatory Visit | Attending: Family Medicine | Admitting: Family Medicine

## 2013-05-23 ENCOUNTER — Encounter: Payer: Self-pay | Admitting: Family Medicine

## 2013-05-23 ENCOUNTER — Ambulatory Visit (INDEPENDENT_AMBULATORY_CARE_PROVIDER_SITE_OTHER): Payer: Medicaid Other | Admitting: Family Medicine

## 2013-05-23 VITALS — BP 151/86 | HR 76 | Temp 97.4°F | Wt 312.0 lb

## 2013-05-23 DIAGNOSIS — Z113 Encounter for screening for infections with a predominantly sexual mode of transmission: Secondary | ICD-10-CM | POA: Insufficient documentation

## 2013-05-23 DIAGNOSIS — N939 Abnormal uterine and vaginal bleeding, unspecified: Secondary | ICD-10-CM

## 2013-05-23 DIAGNOSIS — N898 Other specified noninflammatory disorders of vagina: Secondary | ICD-10-CM

## 2013-05-23 LAB — POCT HEMOGLOBIN: Hemoglobin: 13.7 g/dL (ref 12.2–16.2)

## 2013-05-23 MED ORDER — HYDROCODONE-ACETAMINOPHEN 5-325 MG PO TABS: 1.0000 | ORAL_TABLET | Freq: Four times a day (QID) | ORAL | Status: DC | PRN

## 2013-05-23 NOTE — Patient Instructions (Signed)
It was nice to meet you today!  I'm sorry you are having so much discomfort. Keep taking ibuprofen as needed. I also am giving you a small prescription for some pain medicine to use. Schedule a follow up appointment with Dr. Piedad Climes in one week. If you have any dizziness, ligthheadedness, shortness of breath, or the pain or bleeding are worsening please return immediately.  Be well, Dr. Pollie Meyer

## 2013-05-23 NOTE — Progress Notes (Signed)
Patient ID: Danielle Shaw, female   DOB: 08/03/1970, 42 y.o.   MRN: [id number]   HPI:  Pt presents for a same day appointment to discuss bleeding and cramping after IUD insertion. Pt had IUD inserted 6 days ago, after which she had heavy bleeding and cramping. She has taken ibuprofen 800mg  for the cramping, and also took some old hydrocodone she had from a dentist appointment. The hydrocodone helped with the pain. Every time she gets up to go to the bathroom, she has messed her clothes up with bleeding. She states she knows that her uterus sits in a different position, but that she has had two IUD's prior to this one and has never had any problems with it until now. She thought that the day after having it placed, she could feel it in her cervix but she wasn't able to get her finger in her vagina to check for it. She denies having any dizziness or lightheadedness.   ROS: See HPI  PMFSH: hx obesity, depression, HLD  PHYSICAL EXAM: BP 151/86  Pulse 76  Temp(Src) 97.4 F (36.3 C)  Wt 312 lb (141.522 kg)  BMI 51.92 kg/m2  LMP 04/27/2013 Gen: No acute distress, pleasant and cooperative, appears frustrated HEENT: Normocephalic, atraumatic Abdomen: Soft, obese, nontender to palpation, no masses organomegaly appreciable Neuro: Grossly nonfocal, speech intact Pelvic: Normal appearing external genitalia without lesions, vagina is moist with some blood in the vault. Cervix visualized with IUD strings in place. IUD itself is not visible. There is some mucousy blood coming from the os. She does have some slight tenderness with digital manipulation of the cervix, but no marked tenderness as would be expected with PID. No adnexal masses. She is somewhat tender on her uterine bimanual exam.  ASSESSMENT/PLAN:  # Bleeding and cramping after IUD insertion: - no concerning active bleeding on pelvic exam today - continue ibuprofen for pain control, will also prescribe limited amount of narcotic for use -  point of care fingerstick Hgb today stable at 13.7, so not concerned for life threatening hemorrhage or need for transfusion at this time - will check urine gc/chlamydia today to rule out other causes of pain - expect bleeding and cramping will continue to improve with time, but will have pt f/u in 1 week to ensure symptoms improving. If not improved at that time, may require evaluation by OB/GYN. - return precautions given per AVS - Precepted with Dr. Armen Pickup who also was present for pelvic exam and agrees with this plan.   FOLLOW UP: F/u in 1 week for recheck IUD, or sooner per AVS instructions as needed.  [place] J. Pollie Meyer, MD [facility]

## 2013-06-01 ENCOUNTER — Ambulatory Visit (INDEPENDENT_AMBULATORY_CARE_PROVIDER_SITE_OTHER): Payer: Medicaid Other | Admitting: Emergency Medicine

## 2013-06-01 ENCOUNTER — Other Ambulatory Visit: Payer: Self-pay

## 2013-06-01 ENCOUNTER — Encounter: Payer: Self-pay | Admitting: Emergency Medicine

## 2013-06-01 VITALS — BP 159/115 | HR 68 | Temp 97.8°F | Wt 310.0 lb

## 2013-06-01 DIAGNOSIS — R51 Headache: Secondary | ICD-10-CM

## 2013-06-01 DIAGNOSIS — Z975 Presence of (intrauterine) contraceptive device: Secondary | ICD-10-CM

## 2013-06-01 DIAGNOSIS — R519 Headache, unspecified: Secondary | ICD-10-CM

## 2013-06-01 DIAGNOSIS — I10 Essential (primary) hypertension: Secondary | ICD-10-CM

## 2013-06-01 DIAGNOSIS — G43909 Migraine, unspecified, not intractable, without status migrainosus: Secondary | ICD-10-CM

## 2013-06-01 NOTE — Assessment & Plan Note (Signed)
Medications causing fatigue. Will stop topamax and propranolol as they do not seem to help and may be causing her fatigue. Will place referral to the headache clinic.  Let her know that sometimes it can take awhile to get an appointment.

## 2013-06-01 NOTE — Assessment & Plan Note (Signed)
IUD placed on 10/22 fell out. She has an appointment at GYN to have it replaced next week.

## 2013-06-01 NOTE — Patient Instructions (Signed)
It was nice to see you! I'm sorry about the IUD.  Please stop the propranolol and topomax.  Take lasix only as needed for leg swelling.  I will put in a referral to the headache clinic.  There is usually a waiting list.  Follow up in 3 months or sooner as needed.

## 2013-06-01 NOTE — Progress Notes (Signed)
  Subjective:    Patient ID: Danielle Shaw, female    DOB: March 02, 1971, 42 y.o.   MRN: [id number]  HPI Danielle Shaw is here for f/u IUD and medication.  She had her IUD changed last week.  Afterwards she had significant bleeding and discomfort.  As of last Thursday, the IUD fell out.  She will get a new one placed next week at the gyn office.  She would also like to discuss her medications as she states that something makes her really tired after taking it.    I have reviewed and updated the following as appropriate: allergies and current medications SHx: never smoker   Review of Systems none    Objective:   Physical Exam BP 159/115  Pulse 68  Temp(Src) 97.8 F (36.6 C) (Oral)  Wt 310 lb (140.615 kg)  LMP 04/27/2013 Gen: alert, cooperative, NAD      Assessment & Plan:

## 2013-06-01 NOTE — Assessment & Plan Note (Signed)
Elevated, but has not taken her meds yet today.

## 2013-06-07 ENCOUNTER — Ambulatory Visit (INDEPENDENT_AMBULATORY_CARE_PROVIDER_SITE_OTHER): Payer: Medicaid Other | Admitting: Obstetrics & Gynecology

## 2013-06-07 ENCOUNTER — Encounter: Payer: Self-pay | Admitting: Obstetrics & Gynecology

## 2013-06-07 ENCOUNTER — Encounter: Payer: Self-pay | Admitting: *Deleted

## 2013-06-07 VITALS — BP 156/85 | HR 70 | Resp 20 | Ht 64.5 in | Wt 314.4 lb

## 2013-06-07 DIAGNOSIS — Z01812 Encounter for preprocedural laboratory examination: Secondary | ICD-10-CM

## 2013-06-07 DIAGNOSIS — IMO0001 Reserved for inherently not codable concepts without codable children: Secondary | ICD-10-CM

## 2013-06-07 DIAGNOSIS — Z111 Encounter for screening for respiratory tuberculosis: Secondary | ICD-10-CM

## 2013-06-07 DIAGNOSIS — Z975 Presence of (intrauterine) contraceptive device: Secondary | ICD-10-CM

## 2013-06-07 DIAGNOSIS — Z30430 Encounter for insertion of intrauterine contraceptive device: Secondary | ICD-10-CM

## 2013-06-07 LAB — POCT PREGNANCY, URINE: Preg Test, Ur: NEGATIVE

## 2013-06-07 MED ORDER — LEVONORGESTREL 20 MCG/24HR IU IUD
INTRAUTERINE_SYSTEM | Freq: Once | INTRAUTERINE | Status: AC
  Administered 2013-06-07: 15:00:00 via INTRAUTERINE

## 2013-06-07 NOTE — Patient Instructions (Addendum)
Intrauterine Device Insertion Care After Refer to this sheet in the next few weeks. These instructions provide you with information on caring for yourself after your procedure. Your caregiver may also give you more specific instructions. Your treatment has been planned according to current medical practices, but problems sometimes occur. Call your caregiver if you have any problems or questions after your procedure. HOME CARE INSTRUCTIONS   Only take over-the-counter or prescription medicines for pain, discomfort, or fever as directed by your caregiver. Do not use aspirin. This may increase bleeding.  Check your IUD to make sure it is in place before you resume sexual activity. You should be able to feel the strings. If you cannot feel the strings, something may be wrong. The IUD may have fallen out of the uterus, or the uterus may have been punctured (perforated) during placement. Also, if the strings are getting longer, it may mean that the IUD is being forced out of the uterus. You no longer have full protection from pregnancy if any of these problems occur.  You may resume sexual intercourse if you are not having problems with the IUD. The IUD is considered immediately effective.  You may resume normal activities.  Keep all follow-up appointments to be sure your IUD has remained in place. After the first exam, yearly exams are advised, unless you cannot feel the strings of your IUD.  Continue to check that the IUD is still in place by feeling for the strings after every menstrual period. SEEK MEDICAL CARE IF:   You have bleeding that is heavier or lasts longer than a normal menstrual cycle.  You have a fever.  You have increasing cramps or abdominal pain not relieved with medicine.  You have abdominal pain that does not seem to be related to the same area of earlier cramping and pain.  You are lightheaded, unusually weak, or faint.  You have abnormal vaginal discharge or  smells.  You have pain during sexual intercourse.  You cannot feel the IUD strings, or the IUD string has gotten longer.  You feel the IUD at the opening of the cervix in the vagina.  You think you are pregnant, or you miss your menstrual period.  The IUD string is hurting your sex partner. Document Released: 03/11/2011 Document Revised: 10/05/2011 Document Reviewed: 01/01/2013 [facility] Patient Information 2014 [place], [place]. Uterine Fibroid A uterine fibroid is a growth (tumor) that occurs in a woman's uterus. This type of tumor is not cancerous and does not spread out of the uterus. A woman can have one or many fibroids, and the fiboid(s) can become quite large. A fibroid can vary in size, weight, and where it grows in the uterus. Most fibroids do not require medical treatment, but some can cause pain or heavy bleeding during and between periods. CAUSES  A fibroid is the result of a single uterine cell that keeps growing (unregulated), which is different than most cells in the human body. Most cells have a control mechanism that keeps them from reproducing without control.  SYMPTOMS   Bleeding.  Pelvic pain and pressure.  Bladder problems due to the size of the fibroid.  Infertility and miscarriages depending on the size and location of the fibroid. DIAGNOSIS  A diagnosis is made by physical exam. Your caregiver may feel the lumpy tumors during a pelvic exam. Important information regarding size, location, and number of tumors can be gained by having an ultrasound. It is rare that other tests, such as a CT scan  or MRI, are needed. TREATMENT   Your caregiver may recommend watchful waiting. This involves getting the fibroid checked by your caregiver to see if the fibroids grow or shrink.   Hormonal treatment or an intrauterine device (IUD) may be prescribed.   Surgery may be needed to remove the fibroids (myomectomy) or the uterus (hysterectomy). This depends on your  situation. When fibroids interfere with fertility and a woman wants to become pregnant, a caregiver may recommend having the fibroids removed.  HOME CARE INSTRUCTIONS  Home care depends on how you were treated. In general:   Keep all follow-up appointments with your caregiver.   Only take medicine as told by your caregiver. Do not take aspirin. It can cause bleeding.   If you have excessive periods and soak tampons or pads in a half hour or less, contact your caregiver immediately. If your periods are troublesome but not so heavy, lie down with your feet raised slightly above your heart. Place cold packs on your lower abdomen.   If your periods are heavy, write down the number of pads or tampons you use per month. Bring this information to your caregiver.   Talk to your caregiver about taking iron pills.   Include green vegetables in your diet.   If you were prescribed a hormonal treatment, take the hormonal medicines as directed.   If you need surgery, ask your caregiver for information on your specific surgery.  SEEK IMMEDIATE MEDICAL CARE IF:  You have pelvic pain or cramps not controlled with medicines.   You have a sudden increase in pelvic pain.   You have an increase of bleeding between and during periods.   You feel lightheaded or have fainting episodes.  MAKE SURE YOU:  Understand these instructions.  Will watch your condition.  Will get help right away if you are not doing well or get worse. Document Released: 07/10/2000 Document Revised: 10/05/2011 Document Reviewed: 02/09/2013 [facility] Patient Information 2014 [place], [place].

## 2013-06-07 NOTE — Progress Notes (Signed)
Patient ID: Danielle Shaw, female   DOB: 01-27-1971, 42 y.o.   MRN: [id number] GYNECOLOGY CLINIC PROCEDURE NOTE  Danielle Shaw is a 42 y.o. No obstetric history on file. here for Mirena IUD insertion. No GYN concerns. She had one placed 3 weeks ago at the [facility] but, it fell out.  She reports that it 'felt uncomfortable' from insertion.  This is her 3rd consecutive IUD.  She was never told that she had fibroids.  She has no bleeding with the IUD and no pain.  IUD Insertion Procedure Note Patient identified, informed consent performed.  Discussed risks of irregular bleeding, cramping, infection, malpositioning or misplacement of the IUD outside the uterus which may require further procedures. Time out was performed.  Urine pregnancy test negative.  Speculum placed in the vagina.  Cervix visualized.  Cleaned with Betadine x 2.  Grasped anteriorly with a single tooth tenaculum.  Uterus sounded to 11 cm.  Mirena IUD placed per manufacturer's recommendations.  Strings trimmed to 3 cm. Tenaculum was removed, good hemostasis noted.  Patient tolerated procedure well.   [facility] 05/12/2011: TRANSABDOMINAL AND TRANSVAGINAL ULTRASOUND OF PELVIS  Technique: Both transabdominal and transvaginal ultrasound  examinations of the pelvis were performed. Transabdominal technique  was performed for global imaging of the pelvis including uterus,  ovaries, adnexal regions, and pelvic cul-de-sac.  Comparison: March 25, 2006  It was necessary to proceed with endovaginal exam following the  transabdominal exam to visualize the endometrium.  Findings: The uterus is mildly enlarged and lobular in contour,  measuring 12.7 x 5.9 x 6.1 cm. There are myometrial fibroids at  the fundus which measure 3.4 cm on the left and 4.1 cm on the  right. The IUD is within the endometrial cavity at the fundus.  The endometrial stripe is within normal limits, measuring 9 mm.  Both ovaries have a normal size and appearance. The right  ovary  measures 3.0 x 4.7 x 3.0 cm, and the left ovary measures 5.2 x 2.5  x 2.8 cm. There are no adnexal masses or free pelvic fluid.  IMPRESSION:  Fibroid uterus.  The IUD is within the endometrial cavity.   Patient was given post-procedure instructions.  Patient was also asked to check IUD strings periodically and follow up in 4 weeks for IUD check. Fibroid uterus

## 2013-06-07 NOTE — Progress Notes (Signed)
Pt states her IUD came out on 05/25/13.  She has had unprotected intercourse once since then- approximately 8-9 days ago.  She desires re-insertion today.  Neg UPT.  Pt also requests PPD test today.

## 2013-06-09 ENCOUNTER — Encounter: Payer: Self-pay | Admitting: *Deleted

## 2013-06-12 ENCOUNTER — Telehealth: Payer: Self-pay | Admitting: *Deleted

## 2013-06-12 ENCOUNTER — Encounter: Payer: Self-pay | Admitting: Emergency Medicine

## 2013-06-12 NOTE — Telephone Encounter (Signed)
Message copied by Radene Ou on Mon Jun 12, 2013  9:01 AM ------      Message from: Latrelle Dodrill      Created: Fri Jun 02, 2013  2:15 PM       Please call pt and let her know that gonorrhea and chlamydia tests were negative.      Latrelle Dodrill, MD ------

## 2013-06-12 NOTE — Telephone Encounter (Signed)
Pt given results at 08/01/2012 office visit.  [place] Plake, Darlyne Russian, CMA

## 2013-07-06 ENCOUNTER — Ambulatory Visit (INDEPENDENT_AMBULATORY_CARE_PROVIDER_SITE_OTHER): Payer: Medicaid Other | Admitting: Obstetrics & Gynecology

## 2013-07-06 ENCOUNTER — Encounter: Payer: Self-pay | Admitting: Obstetrics & Gynecology

## 2013-07-06 VITALS — BP 129/91 | HR 80 | Temp 97.1°F | Ht 64.0 in | Wt 320.7 lb

## 2013-07-06 DIAGNOSIS — N946 Dysmenorrhea, unspecified: Secondary | ICD-10-CM

## 2013-07-06 MED ORDER — ACETAMINOPHEN-CODEINE #2 300-15 MG PO TABS: 1.0000 | ORAL_TABLET | Freq: Four times a day (QID) | ORAL | Status: DC | PRN

## 2013-07-06 NOTE — Patient Instructions (Signed)
Levonorgestrel intrauterine device (IUD) What is this medicine? LEVONORGESTREL IUD (LEE voe nor jes trel) is a contraceptive (birth control) device. The device is placed inside the uterus by a healthcare professional. It is used to prevent pregnancy and can also be used to treat heavy bleeding that occurs during your period. Depending on the device, it can be used for 3 to 5 years. This medicine may be used for other purposes; ask your health care provider or pharmacist if you have questions. COMMON BRAND NAME(S): Mirena, Skyla What should I tell my health care provider before I take this medicine? They need to know if you have any of these conditions: -abnormal Pap smear -cancer of the breast, uterus, or cervix -diabetes -endometritis -genital or pelvic infection now or in the past -have more than one sexual partner or your partner has more than one partner -heart disease -history of an ectopic or tubal pregnancy -immune system problems -IUD in place -liver disease or tumor -problems with blood clots or take blood-thinners -use intravenous drugs -uterus of unusual shape -vaginal bleeding that has not been explained -an unusual or allergic reaction to levonorgestrel, other hormones, silicone, or polyethylene, medicines, foods, dyes, or preservatives -pregnant or trying to get pregnant -breast-feeding How should I use this medicine? This device is placed inside the uterus by a health care professional. Talk to your pediatrician regarding the use of this medicine in children. Special care may be needed. Overdosage: If you think you have taken too much of this medicine contact a poison control center or emergency room at once. NOTE: This medicine is only for you. Do not share this medicine with others. What if I miss a dose? This does not apply. What may interact with this medicine? Do not take this medicine with any of the following  medications: -amprenavir -bosentan -fosamprenavir This medicine may also interact with the following medications: -aprepitant -barbiturate medicines for inducing sleep or treating seizures -bexarotene -griseofulvin -medicines to treat seizures like carbamazepine, ethotoin, felbamate, oxcarbazepine, phenytoin, topiramate -modafinil -pioglitazone -rifabutin -rifampin -rifapentine -some medicines to treat HIV infection like atazanavir, indinavir, lopinavir, nelfinavir, tipranavir, ritonavir -St. John's wort -warfarin This list may not describe all possible interactions. Give your health care provider a list of all the medicines, herbs, non-prescription drugs, or dietary supplements you use. Also tell them if you smoke, drink alcohol, or use illegal drugs. Some items may interact with your medicine. What should I watch for while using this medicine? Visit your doctor or health care professional for regular check ups. See your doctor if you or your partner has sexual contact with others, becomes HIV positive, or gets a sexual transmitted disease. This product does not protect you against HIV infection (AIDS) or other sexually transmitted diseases. You can check the placement of the IUD yourself by reaching up to the top of your vagina with clean fingers to feel the threads. Do not pull on the threads. It is a good habit to check placement after each menstrual period. Call your doctor right away if you feel more of the IUD than just the threads or if you cannot feel the threads at all. The IUD may come out by itself. You may become pregnant if the device comes out. If you notice that the IUD has come out use a backup birth control method like condoms and call your health care provider. Using tampons will not change the position of the IUD and are okay to use during your period. What side effects may I   notice from receiving this medicine? Side effects that you should report to your doctor or  health care professional as soon as possible: -allergic reactions like skin rash, itching or hives, swelling of the face, lips, or tongue -fever, flu-like symptoms -genital sores -high blood pressure -no menstrual period for 6 weeks during use -pain, swelling, warmth in the leg -pelvic pain or tenderness -severe or sudden headache -signs of pregnancy -stomach cramping -sudden shortness of breath -trouble with balance, talking, or walking -unusual vaginal bleeding, discharge -yellowing of the eyes or skin Side effects that usually do not require medical attention (report to your doctor or health care professional if they continue or are bothersome): -acne -breast pain -change in sex drive or performance -changes in weight -cramping, dizziness, or faintness while the device is being inserted -headache -irregular menstrual bleeding within first 3 to 6 months of use -nausea This list may not describe all possible side effects. Call your doctor for medical advice about side effects. You may report side effects to FDA at 1-800-FDA-1088. Where should I keep my medicine? This does not apply. NOTE: This sheet is a summary. It may not cover all possible information. If you have questions about this medicine, talk to your doctor, pharmacist, or health care provider.  2014, [employer]vier/Gold Standard. (2011-08-13 13:54:04)  

## 2013-07-06 NOTE — Progress Notes (Signed)
Patient ID: Danielle Shaw, female   DOB: 06-29-1971, 42 y.o.   MRN: [id number] History:  42 y.o. [id number] here today for today for IUD string check; Mirena IUD was placed 06/07/2013.  Pt had previously had one placed 05/17/2013 which fell out.  Pt has that IUD at home on her dresser!  Pt now c/o c/o bleeding of dark blood and cramping.   Review of Systems:  Pertinent items are noted in HPI.  Objective:  Physical Exam Blood pressure 129/91, pulse 80, temperature 97.1 F (36.2 C), temperature source Oral, height 5\' 4"  (1.626 m), weight 320 lb 11.2 oz (145.469 kg), last menstrual period 06/22/2013. Gen: NAD Abd: Soft, nontender and nondistended Pelvic: Normal appearing external genitalia; normal appearing vaginal mucosa and cervix.  IUD strings visualized, about 3 cm in length outside cervix.   Assessment & Plan:  Normal IUD check. Patient to keep IUD in place for five years; can come in for removal if she desires pregnancy within the next five years. Routine preventative health maintenance measures emphasized. Tylenol #3 prn dysmenorrhea

## 2013-10-26 ENCOUNTER — Ambulatory Visit (INDEPENDENT_AMBULATORY_CARE_PROVIDER_SITE_OTHER): Payer: Medicaid Other | Admitting: Family Medicine

## 2013-10-26 ENCOUNTER — Encounter: Payer: Self-pay | Admitting: Family Medicine

## 2013-10-26 VITALS — BP 144/80 | HR 72 | Temp 98.2°F | Ht 65.0 in | Wt 326.0 lb

## 2013-10-26 DIAGNOSIS — G44209 Tension-type headache, unspecified, not intractable: Secondary | ICD-10-CM | POA: Insufficient documentation

## 2013-10-26 MED ORDER — KETOROLAC TROMETHAMINE 60 MG/2ML IM SOLN
60.0000 mg | Freq: Once | INTRAMUSCULAR | Status: AC
  Administered 2013-10-26: 60 mg via INTRAMUSCULAR

## 2013-10-26 NOTE — Assessment & Plan Note (Signed)
Pt with typical tension type HA, no CN or focal nerve deficiencies.  She denies any hepatic or renal impairment and has never had GI bleed or cranial bleed.  Has not really tried anything for this, so will give a shot of toradol 60 mg IM today and have her take tylenol 650 mg TID to QID, restart her ibuprofen tomorrow.  As well, cervical exercises and posture exercises given to her today.  F/U PRN.

## 2013-10-26 NOTE — Progress Notes (Signed)
Danielle Shaw is a 43 y.o. who presents today for HA for three days.  Pt has previous history of migraines tx with Propranolol and Topamax but stopped about 4 months ago due to increasing fatigue and no real benefit.  This episode feels similar to typical migraine but also vase like B/L frontal HA with neck stiffness.  She denies any paresthesias, focal nerve deficits, diplopia, blurred vision, scotomas.  Is having some nausea but no vomiting and also c/o some photophobia.    Past Medical History  Diagnosis Date  . IUD 2004  . IUD 2009  . Hypertension   . Arrhythmia     History   Social History  . Marital Status: Single    Spouse Name: N/A    Number of Children: N/A  . Years of Education: N/A   Occupational History  . Not on file.   Social History Main Topics  . Smoking status: Never Smoker   . Smokeless tobacco: Not on file  . Alcohol Use: Not on file  . Drug Use: Not on file  . Sexual Activity: Not on file   Other Topics Concern  . Not on file   Social History Narrative  . No narrative on file    Family History  Problem Relation Age of Onset  . Heart disease Mother   . Diabetes Father   . Heart disease Father   . Heart disease Maternal Grandmother     Current Outpatient Prescriptions on File Prior to Visit  Medication Sig Dispense Refill  . acetaminophen-codeine (TYLENOL #2) 300-15 MG per tablet Take 1-2 tablets by mouth every 6 (six) hours as needed for moderate pain.  30 tablet  0  . aspirin 81 MG tablet Take 81 mg by mouth daily.       [name] docusate sodium (COLACE) 100 MG capsule Take 1 capsule (100 mg total) by mouth daily.  30 capsule  3  . furosemide (LASIX) 20 MG tablet Take 1 tablet (20 mg total) by mouth daily as needed (for leg swelling).  30 tablet  3  . ibuprofen (ADVIL,MOTRIN) 800 MG tablet Take 1 tablet (800 mg total) by mouth every 8 (eight) hours as needed for pain.  30 tablet  3  . loratadine (CLARITIN) 10 MG tablet Take 1 tablet (10 mg total) by  mouth daily.  30 tablet  6  . losartan-hydrochlorothiazide (HYZAAR) 50-12.5 MG per tablet Take 1 tablet by mouth daily.  30 tablet  6  . omeprazole (PRILOSEC) 20 MG capsule Take 1 capsule (20 mg total) by mouth daily as needed (heartburn).  30 capsule  3  . prochlorperazine (COMPAZINE) 10 MG tablet Take 1 tablet (10 mg total) by mouth every 8 (eight) hours as needed (migraine).  15 tablet  3  . rosuvastatin (CRESTOR) 20 MG tablet Take 1 tablet (20 mg total) by mouth daily.  90 tablet  3  . traMADol (ULTRAM) 50 MG tablet Take 1 tablet (50 mg total) by mouth every 8 (eight) hours as needed for pain. For pain  30 tablet  3  . traZODone (DESYREL) 50 MG tablet Take 0.5-1 tablets (25-50 mg total) by mouth at bedtime as needed for sleep.  30 tablet  3   No current facility-administered medications on file prior to visit.   Physical Exam Filed Vitals:   10/26/13 1004  BP: 144/80  Pulse: 72  Temp: 98.2 F (36.8 C)   Vision: 20/20 L, 20/25 R   Gen: NAD, Well nourished, Well  developed HEENT: PERLA, EOMI, [place]/AT Neck: no JVD Cardio: RRR, No murmurs/gallops/rubs Lungs: CTA, no wheezes, rhonchi, crackles Abd: NABS, soft nontender nondistended MSK: ROM normal  Neuro: CN 2-12 intact, MS 5/5 B/L UE and LE, +2 patellar and achilles relfex b/l  Psych: AAO x 3     Chemistry      Component Value Date/Time   NA 135 04/27/2013 1655   K 3.4* 04/27/2013 1655   CL 102 04/27/2013 1655   CO2 28 04/27/2013 1655   BUN 11 04/27/2013 1655   CREATININE 0.71 04/27/2013 1655   CREATININE 0.87 06/29/2012 0925      Component Value Date/Time   CALCIUM 9.1 04/27/2013 1655   ALKPHOS 83 04/27/2013 1655   AST 14 04/27/2013 1655   ALT 11 04/27/2013 1655   BILITOT 0.3 04/27/2013 1655

## 2013-10-26 NOTE — Patient Instructions (Signed)
Danielle Shaw, please start taking your ibuprofen again tomorrow and start taking tylenol 650 mg (2 pills) three to four times per day for the next two weeks.  As well please work on your posture with a tennis ball at your back during work and sit with your eyes in the middle of the computer screen.  Please follow up as needed.  Thanks, Dr. Awanda Mink  Cervical Strain and Sprain (Whiplash) with Rehab Cervical strain and sprains are injuries that commonly occur with "whiplash" injuries. Whiplash occurs when the neck is forcefully whipped backward or forward, such as during a motor vehicle accident. The muscles, ligaments, tendons, discs and nerves of the neck are susceptible to injury when this occurs. SYMPTOMS   Pain or stiffness in the front and/or back of neck  Symptoms may present immediately or up to 24 hours after injury.  Dizziness, headache, nausea and vomiting.  Muscle spasm with soreness and stiffness in the neck.  Tenderness and swelling at the injury site. CAUSES  Whiplash injuries often occur during contact sports or motor vehicle accidents.  RISK INCREASES WITH:  Osteoarthritis of the spine.  Situations that make head or neck accidents or trauma more likely.  High-risk sports (football, rugby, wrestling, hockey, auto racing, gymnastics, diving, contact karate or boxing).  Poor strength and flexibility of the neck.  Previous neck injury.  Poor tackling technique.  Improperly fitted or padded equipment. PREVENTION  Learn and use proper technique (avoid tackling with the head, spearing and head-butting; use proper falling techniques to avoid landing on the head).  Warm up and stretch properly before activity.  Maintain physical fitness:  Strength, flexibility and endurance.  Cardiovascular fitness.  Wear properly fitted and padded protective equipment, such as padded soft collars, for participation in contact sports. PROGNOSIS  Recovery for cervical strain and  sprain injuries is dependent on the extent of the injury. These injuries are usually curable in 1 week to 3 months with appropriate treatment.  RELATED COMPLICATIONS   Temporary numbness and weakness may occur if the nerve roots are damaged, and this may persist until the nerve has completely healed.  Chronic pain due to frequent recurrence of symptoms.  Prolonged healing, especially if activity is resumed too soon (before complete recovery). TREATMENT  Treatment initially involves the use of ice and medication to help reduce pain and inflammation. It is also important to perform strengthening and stretching exercises and modify activities that worsen symptoms so the injury does not get worse. These exercises may be performed at home or with a therapist. For patients who experience severe symptoms, a soft padded collar may be recommended to be worn around the neck.  Improving your posture may help reduce symptoms. Posture improvement includes pulling your chin and abdomen in while sitting or standing. If you are sitting, sit in a firm chair with your buttocks against the back of the chair. While sleeping, try replacing your pillow with a small towel rolled to 2 inches in diameter, or use a cervical pillow or soft cervical collar. Poor sleeping positions delay healing.  For patients with nerve root damage, which causes numbness or weakness, the use of a cervical traction apparatus may be recommended. Surgery is rarely necessary for these injuries. However, cervical strain and sprains that are present at birth (congenital) may require surgery. MEDICATION   If pain medication is necessary, nonsteroidal anti-inflammatory medications, such as aspirin and ibuprofen, or other minor pain relievers, such as acetaminophen, are often recommended.  Do not take pain  medication for 7 days before surgery.  Prescription pain relievers may be given if deemed necessary by your caregiver. Use only as directed and  only as much as you need. HEAT AND COLD:   Cold treatment (icing) relieves pain and reduces inflammation. Cold treatment should be applied for 10 to 15 minutes every 2 to 3 hours for inflammation and pain and immediately after any activity that aggravates your symptoms. Use ice packs or an ice massage.  Heat treatment may be used prior to performing the stretching and strengthening activities prescribed by your caregiver, physical therapist, or athletic trainer. Use a heat pack or a warm soak. SEEK MEDICAL CARE IF:   Symptoms get worse or do not improve in 2 weeks despite treatment.  New, unexplained symptoms develop (drugs used in treatment may produce side effects). EXERCISES RANGE OF MOTION (ROM) AND STRETCHING EXERCISES - Cervical Strain and Sprain These exercises may help you when beginning to rehabilitate your injury. In order to successfully resolve your symptoms, you must improve your posture. These exercises are designed to help reduce the forward-head and rounded-shoulder posture which contributes to this condition. Your symptoms may resolve with or without further involvement from your physician, physical therapist or athletic trainer. While completing these exercises, remember:   Restoring tissue flexibility helps normal motion to return to the joints. This allows healthier, less painful movement and activity.  An effective stretch should be held for at least 20 seconds, although you may need to begin with shorter hold times for comfort.  A stretch should never be painful. You should only feel a gentle lengthening or release in the stretched tissue. STRETCH- Axial Extensors  Lie on your back on the floor. You may bend your knees for comfort. Place a rolled up hand towel or dish towel, about 2 inches in diameter, under the part of your head that makes contact with the floor.  Gently tuck your chin, as if trying to make a "double chin," until you feel a gentle stretch at the base of  your head.  Hold __________ seconds. Repeat __________ times. Complete this exercise __________ times per day.  STRETECH - Axial Extension   Stand or sit on a firm surface. Assume a good posture: chest up, shoulders drawn back, abdominal muscles slightly tense, knees unlocked (if standing) and feet hip width apart.  Slowly retract your chin so your head slides back and your chin slightly lowers.Continue to look straight ahead.  You should feel a gentle stretch in the back of your head. Be certain not to feel an aggressive stretch since this can cause headaches later.  Hold for __________ seconds. Repeat __________ times. Complete this exercise __________ times per day. STRETCH  Cervical Side Bend   Stand or sit on a firm surface. Assume a good posture: chest up, shoulders drawn back, abdominal muscles slightly tense, knees unlocked (if standing) and feet hip width apart.  Without letting your nose or shoulders move, slowly tip your right / left ear to your shoulder until your feel a gentle stretch in the muscles on the opposite side of your neck.  Hold __________ seconds. Repeat __________ times. Complete this exercise __________ times per day. STRETCH  Cervical Rotators   Stand or sit on a firm surface. Assume a good posture: chest up, shoulders drawn back, abdominal muscles slightly tense, knees unlocked (if standing) and feet hip width apart.  Keeping your eyes level with the ground, slowly turn your head until you feel a gentle stretch along  the back and opposite side of your neck.  Hold __________ seconds. Repeat __________ times. Complete this exercise __________ times per day. RANGE OF MOTION - Neck Circles   Stand or sit on a firm surface. Assume a good posture: chest up, shoulders drawn back, abdominal muscles slightly tense, knees unlocked (if standing) and feet hip width apart.  Gently roll your head down and around from the back of one shoulder to the back of the other.  The motion should never be forced or painful.  Repeat the motion 10-20 times, or until you feel the neck muscles relax and loosen. Repeat __________ times. Complete the exercise __________ times per day. STRENGTHENING EXERCISES - Cervical Strain and Sprain These exercises may help you when beginning to rehabilitate your injury. They may resolve your symptoms with or without further involvement from your physician, physical therapist or athletic trainer. While completing these exercises, remember:   Muscles can gain both the endurance and the strength needed for everyday activities through controlled exercises.  Complete these exercises as instructed by your physician, physical therapist or athletic trainer. Progress the resistance and repetitions only as guided.  You may experience muscle soreness or fatigue, but the pain or discomfort you are trying to eliminate should never worsen during these exercises. If this pain does worsen, stop and make certain you are following the directions exactly. If the pain is still present after adjustments, discontinue the exercise until you can discuss the trouble with your clinician. STRENGTH Cervical Flexors, Isometric  Face a wall, standing about 6 inches away. Place a small pillow, a ball about 6-8 inches in diameter, or a folded towel between your forehead and the wall.  Slightly tuck your chin and gently push your forehead into the soft object. Push only with mild to moderate intensity, building up tension gradually. Keep your jaw and forehead relaxed.  Hold 10 to 20 seconds. Keep your breathing relaxed.  Release the tension slowly. Relax your neck muscles completely before you start the next repetition. Repeat __________ times. Complete this exercise __________ times per day. STRENGTH- Cervical Lateral Flexors, Isometric   Stand about 6 inches away from a wall. Place a small pillow, a ball about 6-8 inches in diameter, or a folded towel between the  side of your head and the wall.  Slightly tuck your chin and gently tilt your head into the soft object. Push only with mild to moderate intensity, building up tension gradually. Keep your jaw and forehead relaxed.  Hold 10 to 20 seconds. Keep your breathing relaxed.  Release the tension slowly. Relax your neck muscles completely before you start the next repetition. Repeat __________ times. Complete this exercise __________ times per day. STRENGTH  Cervical Extensors, Isometric   Stand about 6 inches away from a wall. Place a small pillow, a ball about 6-8 inches in diameter, or a folded towel between the back of your head and the wall.  Slightly tuck your chin and gently tilt your head back into the soft object. Push only with mild to moderate intensity, building up tension gradually. Keep your jaw and forehead relaxed.  Hold 10 to 20 seconds. Keep your breathing relaxed.  Release the tension slowly. Relax your neck muscles completely before you start the next repetition. Repeat __________ times. Complete this exercise __________ times per day. POSTURE AND BODY MECHANICS CONSIDERATIONS - Cervical Strain and Sprain Keeping correct posture when sitting, standing or completing your activities will reduce the stress put on different body tissues, allowing injured  tissues a chance to heal and limiting painful experiences. The following are general guidelines for improved posture. Your physician or physical therapist will provide you with any instructions specific to your needs. While reading these guidelines, remember:  The exercises prescribed by your provider will help you have the flexibility and strength to maintain correct postures.  The correct posture provides the optimal environment for your joints to work. All of your joints have less wear and tear when properly supported by a spine with good posture. This means you will experience a healthier, less painful body.  Correct posture must  be practiced with all of your activities, especially prolonged sitting and standing. Correct posture is as important when doing repetitive low-stress activities (typing) as it is when doing a single heavy-load activity (lifting). PROLONGED STANDING WHILE SLIGHTLY LEANING FORWARD When completing a task that requires you to lean forward while standing in one place for a long time, place either foot up on a stationary 2-4 inch high object to help maintain the best posture. When both feet are on the ground, the low back tends to lose its slight inward curve. If this curve flattens (or becomes too large), then the back and your other joints will experience too much stress, fatigue more quickly and can cause pain.  RESTING POSITIONS Consider which positions are most painful for you when choosing a resting position. If you have pain with flexion-based activities (sitting, bending, stooping, squatting), choose a position that allows you to rest in a less flexed posture. You would want to avoid curling into a fetal position on your side. If your pain worsens with extension-based activities (prolonged standing, working overhead), avoid resting in an extended position such as sleeping on your stomach. Most people will find more comfort when they rest with their spine in a more neutral position, neither too rounded nor too arched. Lying on a non-sagging bed on your side with a pillow between your knees, or on your back with a pillow under your knees will often provide some relief. Keep in mind, being in any one position for a prolonged period of time, no matter how correct your posture, can still lead to stiffness. WALKING Walk with an upright posture. Your ears, shoulders and hips should all line-up. OFFICE WORK When working at a desk, create an environment that supports good, upright posture. Without extra support, muscles fatigue and lead to excessive strain on joints and other tissues. CHAIR:  A chair should be  able to slide under your desk when your back makes contact with the back of the chair. This allows you to work closely.  The chair's height should allow your eyes to be level with the upper part of your monitor and your hands to be slightly lower than your elbows.  Body position:  Your feet should make contact with the floor. If this is not possible, use a foot rest.  Keep your ears over your shoulders. This will reduce stress on your neck and low back. Document Released: 07/13/2005 Document Revised: 11/07/2012 Document Reviewed: 10/25/2008 [facility] Patient Information 2014 [place], [place].

## 2014-02-01 ENCOUNTER — Ambulatory Visit (INDEPENDENT_AMBULATORY_CARE_PROVIDER_SITE_OTHER): Payer: Medicaid Other | Admitting: Family Medicine

## 2014-02-01 ENCOUNTER — Telehealth: Payer: Self-pay | Admitting: Family Medicine

## 2014-02-01 VITALS — BP 137/93 | HR 73 | Ht 65.0 in | Wt 325.0 lb

## 2014-02-01 DIAGNOSIS — I10 Essential (primary) hypertension: Secondary | ICD-10-CM

## 2014-02-01 DIAGNOSIS — J0100 Acute maxillary sinusitis, unspecified: Secondary | ICD-10-CM

## 2014-02-01 DIAGNOSIS — M7989 Other specified soft tissue disorders: Secondary | ICD-10-CM

## 2014-02-01 DIAGNOSIS — R002 Palpitations: Secondary | ICD-10-CM

## 2014-02-01 LAB — CBC WITH DIFFERENTIAL/PLATELET
BASOS ABS: 0 10*3/uL (ref 0.0–0.1)
BASOS PCT: 1 % (ref 0–1)
Eosinophils Absolute: 0.2 10*3/uL (ref 0.0–0.7)
Eosinophils Relative: 4 % (ref 0–5)
HCT: 37 % (ref 36.0–46.0)
Hemoglobin: 12.8 g/dL (ref 12.0–15.0)
Lymphocytes Relative: 54 % — ABNORMAL HIGH (ref 12–46)
Lymphs Abs: 2.3 10*3/uL (ref 0.7–4.0)
MCH: 29.1 pg (ref 26.0–34.0)
MCHC: 34.6 g/dL (ref 30.0–36.0)
MCV: 84.1 fL (ref 78.0–100.0)
MONOS PCT: 6 % (ref 3–12)
Monocytes Absolute: 0.3 10*3/uL (ref 0.1–1.0)
NEUTROS PCT: 35 % — AB (ref 43–77)
Neutro Abs: 1.5 10*3/uL — ABNORMAL LOW (ref 1.7–7.7)
Platelets: 400 10*3/uL (ref 150–400)
RBC: 4.4 MIL/uL (ref 3.87–5.11)
RDW: 14.7 % (ref 11.5–15.5)
WBC: 4.2 10*3/uL (ref 4.0–10.5)

## 2014-02-01 LAB — COMPREHENSIVE METABOLIC PANEL
ALBUMIN: 3.9 g/dL (ref 3.5–5.2)
ALT: 14 U/L (ref 0–35)
AST: 17 U/L (ref 0–37)
Alkaline Phosphatase: 83 U/L (ref 39–117)
BUN: 14 mg/dL (ref 6–23)
CALCIUM: 9 mg/dL (ref 8.4–10.5)
CHLORIDE: 103 meq/L (ref 96–112)
CO2: 28 mEq/L (ref 19–32)
Creat: 0.64 mg/dL (ref 0.50–1.10)
Glucose, Bld: 89 mg/dL (ref 70–99)
POTASSIUM: 3.6 meq/L (ref 3.5–5.3)
Sodium: 139 mEq/L (ref 135–145)
Total Bilirubin: 0.5 mg/dL (ref 0.2–1.2)
Total Protein: 6.9 g/dL (ref 6.0–8.3)

## 2014-02-01 LAB — LIPID PANEL
CHOLESTEROL: 250 mg/dL — AB (ref 0–200)
HDL: 36 mg/dL — ABNORMAL LOW (ref >39)
LDL CALC: 190 mg/dL — AB (ref 0–99)
Total CHOL/HDL Ratio: 6.9 Ratio
Triglycerides: 122 mg/dL (ref <150)
VLDL: 24 mg/dL (ref 0–40)

## 2014-02-01 LAB — TSH: TSH: 1.125 u[IU]/mL (ref 0.350–4.500)

## 2014-02-01 MED ORDER — AZITHROMYCIN 250 MG PO TABS: ORAL_TABLET | ORAL | Status: DC

## 2014-02-01 NOTE — Progress Notes (Signed)
Patient ID: Danielle Shaw, female   DOB: 1970/11/07, 43 y.o.   MRN: [id number]  Kenn File, MD Phone: [phone]  Subjective:  Chief complaint-noted  Pt Here for followup hypertension, palpitations, leg edema, sinus pressure  Pressure Patient states she's had maxillary and frontal sinus pressure for about one week. She also notes a generalized malaise and fatigue but no overt fevers in that week. She denies any decreased by mouth intake. She takes Claritin every day for her allergies but is not sure if helps She's tried several steroid nose sprays which have not worked for her  Hypertension Positive headache, no chest pain, positive palpitations, positive leg edema She does endorse constant chest pressure at night when she lays down to sleep, she also has the same sensation during her palpitations described below Takes her medications everyday, checks her blood pressure at home is usually 130 to 140/80-90  Palpitations Has been evaluated by cardiology previously and found PACs, PVCs and brief run of A. fib. They have been slightly worse for last several months of and worse now over the last month. She states that she has 1-30 seconds of racing heart sensation, coupled with chest heaviness but no loss of consciousness. These come on at random times without any predictable aggravating factors. They happen occasionally when she is sleeping lying down, and sometimes at work.  Edema She has been using Lasix intermittently for edema for quite some time. She feels that most of her weight is from fluid. She comes seeking additional help for her edema she has not been taking Lasix lately and does not want to take it every day. The edema is mostly completely resolved when she wakes in the morning  ROS-  For fever, chills, sweats Positive malaise fatigue Positive sinus pressure Positive palpitations No chest heaviness today but has had it recently No dyspnea today    Past Medical  History Patient Active Problem List   Diagnosis Date Noted  . Tension headache 10/26/2013  . Right knee pain 04/27/2013  . Acute sinus infection 04/27/2013  . Shortness of breath 04/27/2013  . Sore throat 12/23/2012  . Leg swelling 10/04/2012  . Annual physical exam 07/15/2012  . Depression 02/19/2012  . Insomnia 10/22/2011  . Stress At [facility] 10/15/2011  . Palpitations 05/11/2011  . Presence of intrauterine contraceptive device (IUD)   . UNSPECIFIED BACKACHE 08/15/2010  . CONSTIPATION, CHRONIC 02/18/2009  . SINUSITIS, CHRONIC 11/06/2008  . MORBID OBESITY 03/07/2007  . HYPERLIPIDEMIA 09/23/2006  . MIGRAINE, UNSPEC., W/O INTRACTABLE MIGRAINE 09/23/2006  . HYPERTENSION, BENIGN SYSTEMIC 09/23/2006    Medications- reviewed and updated Current Outpatient Prescriptions  Medication Sig Dispense Refill  . acetaminophen-codeine (TYLENOL #2) 300-15 MG per tablet Take 1-2 tablets by mouth every 6 (six) hours as needed for moderate pain.  30 tablet  0  . aspirin 81 MG tablet Take 81 mg by mouth daily.       [name] azithromycin (ZITHROMAX) 250 MG tablet Take 2 pills on day 1 then 1 pill daily until gone  6 tablet  0  . docusate sodium (COLACE) 100 MG capsule Take 1 capsule (100 mg total) by mouth daily.  30 capsule  3  . furosemide (LASIX) 20 MG tablet Take 1 tablet (20 mg total) by mouth daily as needed (for leg swelling).  30 tablet  3  . ibuprofen (ADVIL,MOTRIN) 800 MG tablet Take 1 tablet (800 mg total) by mouth every 8 (eight) hours as needed for pain.  30 tablet  3  .  loratadine (CLARITIN) 10 MG tablet Take 1 tablet (10 mg total) by mouth daily.  30 tablet  6  . losartan-hydrochlorothiazide (HYZAAR) 50-12.5 MG per tablet Take 1 tablet by mouth daily.  30 tablet  6  . omeprazole (PRILOSEC) 20 MG capsule Take 1 capsule (20 mg total) by mouth daily as needed (heartburn).  30 capsule  3  . prochlorperazine (COMPAZINE) 10 MG tablet Take 1 tablet (10 mg total) by mouth every 8 (eight) hours as needed  (migraine).  15 tablet  3  . rosuvastatin (CRESTOR) 20 MG tablet Take 1 tablet (20 mg total) by mouth daily.  90 tablet  3  . traMADol (ULTRAM) 50 MG tablet Take 1 tablet (50 mg total) by mouth every 8 (eight) hours as needed for pain. For pain  30 tablet  3  . traZODone (DESYREL) 50 MG tablet Take 0.5-1 tablets (25-50 mg total) by mouth at bedtime as needed for sleep.  30 tablet  3   No current facility-administered medications for this visit.    Objective: BP 137/93  Pulse 73  Ht 5\' 5"  (1.651 m)  Wt 325 lb (147.419 kg)  BMI 54.08 kg/m2 Gen: NAD, alert, cooperative with exam HEENT: NCAT, maxillary and frontal sinus pain with palpation, right TM erythematous and bulging, left TM bulging but not erythematous, bilateral turbinates swollen CV: RRR, good S1/S2, no murmur Resp: CTABL, no wheezes, non-labored Abd:  obese Ext: 2-3+ edema to the midcalf Neuro: Alert and oriented, No gross deficits   Assessment/Plan:  Palpitations As the worked up by cardiology most likely due to PACs/PVCs, previous brief run of A. Fib. They're becoming more frequent, I reviewed and provided red flags for palpitations No symptoms today Refer to cardiology  Acute sinus infection Symptoms x1 week, tenderness on maxillary facial sinus, erythematous right TM which could be considered in acute suppurative otitis media Likely acute on chronic, discussed use of NetiPot Change antihistamine to Zyrtec Followup as needed  HYPERTENSION, BENIGN SYSTEMIC Slightly elevated at 137/93 Continue losartan/HCTZ, intermittent Lasix as patient describes is okay as well   Leg swelling Leg edema likely due to venous insufficiency do to obesity. Discussed obesity and her comorbid 80s at length, discussed drastic lifestyle changes in bariatric surgery I'm okay to continue 20 mg when necessary Lasix, however this is not ideal Encouraged patient to lose weight, elevate legs, use compression stockings- Rx given today for  compression stockings    Orders Placed This Encounter  Procedures  . CBC with Differential  . Lipid Panel  . Comprehensive metabolic panel  . TSH  . Ambulatory referral to Cardiology    Referral Priority:  Routine    Referral Type:  Consultation    Referral Reason:  Specialty Services Required    Requested Specialty:  Cardiology    Number of Visits Requested:  1    Meds ordered this encounter  Medications  . azithromycin (ZITHROMAX) 250 MG tablet    Sig: Take 2 pills on day 1 then 1 pill daily until gone    Dispense:  6 tablet    Refill:  0

## 2014-02-01 NOTE — Assessment & Plan Note (Signed)
Slightly elevated at 137/93 Continue losartan/HCTZ, intermittent Lasix as patient describes is okay as well

## 2014-02-01 NOTE — Assessment & Plan Note (Signed)
Leg edema likely due to venous insufficiency do to obesity. Discussed obesity and her comorbid 80s at length, discussed drastic lifestyle changes in bariatric surgery I'm okay to continue 20 mg when necessary Lasix, however this is not ideal Encouraged patient to lose weight, elevate legs, use compression stockings- Rx given today for compression stockings

## 2014-02-01 NOTE — Telephone Encounter (Signed)
LVM to pt   Appt 07/24@10 :[place]

## 2014-02-01 NOTE — Assessment & Plan Note (Signed)
Symptoms x1 week, tenderness on maxillary facial sinus, erythematous right TM which could be considered in acute suppurative otitis media Likely acute on chronic, discussed use of NetiPot Change antihistamine to Zyrtec Followup as needed

## 2014-02-01 NOTE — Patient Instructions (Signed)
Great to meet you !!!  Think about the surgery, I understand your resevations.   I have sent a cardiology referral  We talked about the Neti pot, watch it on youtube, the brand doesn't matter  Palpitations A palpitation is the feeling that your heartbeat is irregular or is faster than normal. It may feel like your heart is fluttering or skipping a beat. Palpitations are usually not a serious problem. However, in some cases, you may need further medical evaluation. CAUSES  Palpitations can be caused by:  Smoking.  Caffeine or other stimulants, such as diet pills or energy drinks.  Alcohol.  Stress and anxiety.  Strenuous physical activity.  Fatigue.  Certain medicines.  Heart disease, especially if you have a history of irregular heart rhythms (arrhythmias), such as atrial fibrillation, atrial flutter, or supraventricular tachycardia.  An improperly working pacemaker or defibrillator. DIAGNOSIS  To find the cause of your palpitations, your health care provider will take your medical history and perform a physical exam. Your health care provider may also have you take a test called an ambulatory electrocardiogram (ECG). An ECG records your heartbeat patterns over a 24-hour period. You may also have other tests, such as:  Transthoracic echocardiogram (TTE). During echocardiography, sound waves are used to evaluate how blood flows through your heart.  Transesophageal echocardiogram (TEE).  Cardiac monitoring. This allows your health care provider to monitor your heart rate and rhythm in real time.  Holter monitor. This is a portable device that records your heartbeat and can help diagnose heart arrhythmias. It allows your health care provider to track your heart activity for several days, if needed.  Stress tests by exercise or by giving medicine that makes the heart beat faster. TREATMENT  Treatment of palpitations depends on the cause of your symptoms and can vary greatly.  Most cases of palpitations do not require any treatment other than time, relaxation, and monitoring your symptoms. Other causes, such as atrial fibrillation, atrial flutter, or supraventricular tachycardia, usually require further treatment. HOME CARE INSTRUCTIONS   Avoid:  Caffeinated coffee, tea, soft drinks, diet pills, and energy drinks.  Chocolate.  Alcohol.  Stop smoking if you smoke.  Reduce your stress and anxiety. Things that can help you relax include:  A method of controlling things in your body, such as your heartbeats, with your mind (biofeedback).  Yoga.  Meditation.  Physical activity such as swimming, jogging, or walking.  Get plenty of rest and sleep. SEEK MEDICAL CARE IF:   You continue to have a fast or irregular heartbeat beyond 24 hours.  Your palpitations occur more often. SEEK IMMEDIATE MEDICAL CARE IF:  You have chest pain or shortness of breath.  You have a severe headache.  You feel dizzy or you faint. MAKE SURE YOU:  Understand these instructions.  Will watch your condition.  Will get help right away if you are not doing well or get worse. Document Released: 07/10/2000 Document Revised: 07/18/2013 Document Reviewed: 09/11/2011 [facility] Patient Information 2015 [place], [place]. This information is not intended to replace advice given to you by your health care provider. Make sure you discuss any questions you have with your health care provider.

## 2014-02-01 NOTE — Assessment & Plan Note (Signed)
As the worked up by cardiology most likely due to PACs/PVCs, previous brief run of A. Fib. They're becoming more frequent, I reviewed and provided red flags for palpitations No symptoms today Refer to cardiology

## 2014-02-05 ENCOUNTER — Telehealth: Payer: Self-pay | Admitting: Family Medicine

## 2014-02-05 NOTE — Telephone Encounter (Signed)
Called adn discussed elevated LDL. Pt has missed a few doses. Advised good compliance and will check LDL next visit.   Laroy Apple, MD [place] Resident, PGY-3 02/05/2014, 3:59 PM

## 2014-02-16 ENCOUNTER — Ambulatory Visit (INDEPENDENT_AMBULATORY_CARE_PROVIDER_SITE_OTHER): Payer: Medicaid Other | Admitting: Cardiology

## 2014-02-16 ENCOUNTER — Encounter: Payer: Self-pay | Admitting: Cardiology

## 2014-02-16 VITALS — BP 134/90 | HR 78 | Ht 65.0 in | Wt 326.0 lb

## 2014-02-16 DIAGNOSIS — I119 Hypertensive heart disease without heart failure: Secondary | ICD-10-CM

## 2014-02-16 DIAGNOSIS — R002 Palpitations: Secondary | ICD-10-CM

## 2014-02-16 DIAGNOSIS — E876 Hypokalemia: Secondary | ICD-10-CM

## 2014-02-16 DIAGNOSIS — E7800 Pure hypercholesterolemia, unspecified: Secondary | ICD-10-CM

## 2014-02-16 MED ORDER — POTASSIUM CHLORIDE CRYS ER 20 MEQ PO TBCR: 20.0000 meq | EXTENDED_RELEASE_TABLET | Freq: Every day | ORAL | Status: DC

## 2014-02-16 NOTE — Patient Instructions (Signed)
START KDUR (POTASSIUM) DAILY, RX SENT TO [id number]  Work on weight loss  Follow up as needed

## 2014-02-16 NOTE — Progress Notes (Signed)
Danielle Shaw Date of Birth:  1970-10-13 [place] [address] [place], [place]  [address] [phone]        Fax   [phone]   History of Present Illness: This 43 year old African American woman is seen after a three-year absence.  She was last seen in our office in 2012 by Dr. Dorris Carnes.  She receives her medical care at [facility].  She comes back because of concern over palpitations.  3 years ago the patient was evaluated with Holter monitor which showed PACs and PVCs and one short burst of atrial fibrillation.  She had an echocardiogram in 2012 which showed normal systolic function with an ejection fraction of 55-60%.  There was mild left atrial enlargement.  At that time Dr. Harrington Challenger placed her on a beta blocker but she did not tolerate it because of malaise and fatigue.  She was then given a prescription for diltiazem to have on hand for symptomatic palpitations.  She does not think that she never got the prescription filled.  Her palpitations have not been severe.  They were worse 2 weeks ago and since then has improved.  She has not had any dizzy spells or syncope.  She is not having any chest pain or she does not drink much caffeine.  She drinks mainly water. She has a past history of hypertension.  She is not diabetic.  She had recent lab work at primary care which showed underlying hypokalemia. The patient has a history of morbid obesity.  Her weight has gone up 7 pounds since last visit. The patient works as a Art gallery manager at [employer].  She has 2 children.  She does not smoke.  She uses no illicit drugs.  She drinks rare alcohol.   Current Outpatient Prescriptions  Medication Sig Dispense Refill  . acetaminophen-codeine (TYLENOL #2) 300-15 MG per tablet Take 1-2 tablets by mouth every 6 (six) hours as needed for moderate pain.  30 tablet  0  . aspirin 81 MG tablet Take 81 mg by mouth daily.       [name] azithromycin  (ZITHROMAX) 250 MG tablet Take 2 pills on day 1 then 1 pill daily until gone  6 tablet  0  . docusate sodium (COLACE) 100 MG capsule Take 1 capsule (100 mg total) by mouth daily.  30 capsule  3  . ibuprofen (ADVIL,MOTRIN) 800 MG tablet Take 1 tablet (800 mg total) by mouth every 8 (eight) hours as needed for pain.  30 tablet  3  . loratadine (CLARITIN) 10 MG tablet Take 1 tablet (10 mg total) by mouth daily.  30 tablet  6  . losartan-hydrochlorothiazide (HYZAAR) 50-12.5 MG per tablet Take 1 tablet by mouth daily.  30 tablet  6  . omeprazole (PRILOSEC) 20 MG capsule Take 1 capsule (20 mg total) by mouth daily as needed (heartburn).  30 capsule  3  . rosuvastatin (CRESTOR) 20 MG tablet Take 1 tablet (20 mg total) by mouth daily.  90 tablet  3  . traMADol (ULTRAM) 50 MG tablet Take 1 tablet (50 mg total) by mouth every 8 (eight) hours as needed for pain. For pain  30 tablet  3  . traZODone (DESYREL) 50 MG tablet Take 0.5-1 tablets (25-50 mg total) by mouth at bedtime as needed for sleep.  30 tablet  3  . furosemide (LASIX) 20 MG tablet Take 1 tablet (20 mg total) by mouth daily as  needed (for leg swelling).  30 tablet  3  . potassium chloride SA (K-DUR,KLOR-CON) 20 MEQ tablet Take 1 tablet (20 mEq total) by mouth daily.  30 tablet  5  . prochlorperazine (COMPAZINE) 10 MG tablet Take 1 tablet (10 mg total) by mouth every 8 (eight) hours as needed (migraine).  15 tablet  3   No current facility-administered medications for this visit.    Allergies  Allergen Reactions  . Latex   . Lisinopril   . Metoprolol     Patient Active Problem List   Diagnosis Date Noted  . Hypokalemia 02/16/2014  . Tension headache 10/26/2013  . Right knee pain 04/27/2013  . Acute sinus infection 04/27/2013  . Shortness of breath 04/27/2013  . Sore throat 12/23/2012  . Leg swelling 10/04/2012  . Annual physical exam 07/15/2012  . Depression 02/19/2012  . Insomnia 10/22/2011  . Stress At [facility] 10/15/2011  .  Palpitations 05/11/2011  . Presence of intrauterine contraceptive device (IUD)   . UNSPECIFIED BACKACHE 08/15/2010  . CONSTIPATION, CHRONIC 02/18/2009  . SINUSITIS, CHRONIC 11/06/2008  . MORBID OBESITY 03/07/2007  . HYPERLIPIDEMIA 09/23/2006  . MIGRAINE, UNSPEC., W/O INTRACTABLE MIGRAINE 09/23/2006  . HYPERTENSION, BENIGN SYSTEMIC 09/23/2006    History  Smoking status  . Never Smoker   Smokeless tobacco  . Not on file    History  Alcohol Use: Not on file    Family History  Problem Relation Age of Onset  . Heart disease Mother   . Diabetes Father   . Heart disease Father   . Heart disease Maternal Grandmother     Review of Systems: Constitutional: no fever chills diaphoresis or fatigue or change in weight.  Head and neck: no hearing loss, no epistaxis, no photophobia or visual disturbance. Respiratory: No cough, shortness of breath or wheezing. Cardiovascular: No chest pain peripheral edema, palpitations. Gastrointestinal: No abdominal distention, no abdominal pain, no change in bowel habits hematochezia or melena. Genitourinary: No dysuria, no frequency, no urgency, no nocturia. Musculoskeletal:No arthralgias, no back pain, no gait disturbance or myalgias. Neurological: No dizziness, no headaches, no numbness, no seizures, no syncope, no weakness, no tremors. Hematologic: No lymphadenopathy, no easy bruising. Psychiatric: No confusion, no hallucinations, no sleep disturbance.    Physical Exam: Filed Vitals:   02/16/14 1100  BP: 134/90  Pulse: 78   the general appearance reveals a well-developed well-nourished but morbidly obese woman in no distress.The head and neck exam reveals pupils equal and reactive.  Extraocular movements are full.  There is no scleral icterus.  The mouth and pharynx are normal.  The neck is supple.  The carotids reveal no bruits.  The jugular venous pressure is normal.  The  thyroid is not enlarged.  There is no lymphadenopathy.  The chest is  clear to percussion and auscultation.  There are no rales or rhonchi.  Expansion of the chest is symmetrical.  The precordium is quiet.  The first heart sound is normal.  The second heart sound is physiologically split.  There is no murmur gallop rub or click.  There is no abnormal lift or heave.  The abdomen is soft and nontender.  The bowel sounds are normal.  The liver and spleen are not enlarged.  There are no abdominal masses.  There are no abdominal bruits.  Extremities reveal good pedal pulses.  There is no phlebitis or edema.  There is no cyanosis or clubbing.  Strength is normal and symmetrical in all extremities.  There is no lateralizing  weakness.  There are no sensory deficits.  The skin is warm and dry.  There is no rash.  EKG shows normal sinus rhythm with nonspecific T-wave flattening unchanged since 05/06/11   Assessment / Plan: 1. Palpitations. 2.  Borderline hypokalemia secondary to hydrochlorothiazide 3. essential hypertension 4. morbid obesity  Plan: Advised her to avoid caffeinated beverages.  I have encouraged weight loss.  Some of her palpitations Shaw be secondary to borderline hypokalemia and we will add K-Dur 20 milliequivalents one daily. No indication at this point to repeat her monitor her or her echo.  However if symptoms worsen we would want to see her again and consider another event monitor.  She states that her episodes of palpitations occur once or twice a week. Recheck as needed.  Continue close followup with PCP Dr. Wendi Snipes at [facility]

## 2014-03-01 ENCOUNTER — Ambulatory Visit: Payer: Medicaid Other | Admitting: Family Medicine

## 2014-05-11 ENCOUNTER — Other Ambulatory Visit: Payer: Self-pay

## 2014-05-28 ENCOUNTER — Encounter: Payer: Self-pay | Admitting: Cardiology

## 2015-05-03 ENCOUNTER — Encounter: Payer: Self-pay | Admitting: Internal Medicine

## 2015-05-03 ENCOUNTER — Other Ambulatory Visit (INDEPENDENT_AMBULATORY_CARE_PROVIDER_SITE_OTHER): Payer: BLUE CROSS/BLUE SHIELD

## 2015-05-03 ENCOUNTER — Ambulatory Visit (INDEPENDENT_AMBULATORY_CARE_PROVIDER_SITE_OTHER): Payer: BLUE CROSS/BLUE SHIELD | Admitting: Internal Medicine

## 2015-05-03 VITALS — BP 134/88 | HR 76 | Temp 98.2°F | Resp 16 | Ht 64.5 in | Wt 335.1 lb

## 2015-05-03 DIAGNOSIS — J3089 Other allergic rhinitis: Secondary | ICD-10-CM

## 2015-05-03 DIAGNOSIS — Z0000 Encounter for general adult medical examination without abnormal findings: Secondary | ICD-10-CM

## 2015-05-03 DIAGNOSIS — Z23 Encounter for immunization: Secondary | ICD-10-CM | POA: Diagnosis not present

## 2015-05-03 DIAGNOSIS — J309 Allergic rhinitis, unspecified: Secondary | ICD-10-CM | POA: Insufficient documentation

## 2015-05-03 DIAGNOSIS — I10 Essential (primary) hypertension: Secondary | ICD-10-CM | POA: Diagnosis not present

## 2015-05-03 DIAGNOSIS — E785 Hyperlipidemia, unspecified: Secondary | ICD-10-CM

## 2015-05-03 LAB — HEMOGLOBIN A1C: Hgb A1c MFr Bld: 6 % (ref 4.6–6.5)

## 2015-05-03 LAB — COMPREHENSIVE METABOLIC PANEL
ALT: 11 U/L (ref 0–35)
AST: 13 U/L (ref 0–37)
Albumin: 4 g/dL (ref 3.5–5.2)
Alkaline Phosphatase: 96 U/L (ref 39–117)
BUN: 7 mg/dL (ref 6–23)
CO2: 29 meq/L (ref 19–32)
Calcium: 9.3 mg/dL (ref 8.4–10.5)
Chloride: 102 mEq/L (ref 96–112)
Creatinine, Ser: 0.68 mg/dL (ref 0.40–1.20)
GFR: 121.69 mL/min (ref >60.00)
GLUCOSE: 97 mg/dL (ref 70–99)
POTASSIUM: 3.3 meq/L — AB (ref 3.5–5.1)
Sodium: 139 mEq/L (ref 135–145)
Total Bilirubin: 0.3 mg/dL (ref 0.2–1.2)
Total Protein: 8 g/dL (ref 6.0–8.3)

## 2015-05-03 LAB — CBC
HCT: 38.6 % (ref 36.0–46.0)
Hemoglobin: 13.1 g/dL (ref 12.0–15.0)
MCHC: 34 g/dL (ref 30.0–36.0)
MCV: 86 fl (ref 78.0–100.0)
Platelets: 389 10*3/uL (ref 150.0–400.0)
RBC: 4.49 Mil/uL (ref 3.87–5.11)
RDW: 13.6 % (ref 11.5–15.5)
WBC: 5.6 10*3/uL (ref 4.0–10.5)

## 2015-05-03 LAB — LIPID PANEL
CHOL/HDL RATIO: 7
Cholesterol: 269 mg/dL — ABNORMAL HIGH (ref 0–200)
HDL: 38.4 mg/dL — AB (ref >39.00)
LDL Cholesterol: 203 mg/dL — ABNORMAL HIGH (ref 0–99)
NONHDL: 230.85
Triglycerides: 141 mg/dL (ref 0.0–149.0)
VLDL: 28.2 mg/dL (ref 0.0–40.0)

## 2015-05-03 MED ORDER — PANTOPRAZOLE SODIUM 40 MG PO TBEC: 40.0000 mg | DELAYED_RELEASE_TABLET | Freq: Every day | ORAL | Status: DC

## 2015-05-03 MED ORDER — LEVOCETIRIZINE DIHYDROCHLORIDE 5 MG PO TABS: 5.0000 mg | ORAL_TABLET | Freq: Every evening | ORAL | Status: DC

## 2015-05-03 NOTE — Assessment & Plan Note (Signed)
Checking lipid panel, not on medication at this time. Previously on crestor 20 mg daily.

## 2015-05-03 NOTE — Progress Notes (Signed)
Pre visit review using our clinic review tool, if applicable. No additional management support is needed unless otherwise documented below in the visit note. 

## 2015-05-03 NOTE — Patient Instructions (Signed)
We have sent in the xyzal for the allergies. Take 1 pill a day at night time for the sinuses. This may take 5 days or so to fully kick in.   We have also sent in a medicine for the stomach called protonix. Take 1 pill before breakfast daily to see if this helps with your symptoms.   Health Maintenance, Female Adopting a healthy lifestyle and getting preventive care can go a long way to promote health and wellness. Talk with your health care provider about what schedule of regular examinations is right for you. This is a good chance for you to check in with your provider about disease prevention and staying healthy. In between checkups, there are plenty of things you can do on your own. Experts have done a lot of research about which lifestyle changes and preventive measures are most likely to keep you healthy. Ask your health care provider for more information. WEIGHT AND DIET  Eat a healthy diet  Be sure to include plenty of vegetables, fruits, low-fat dairy products, and lean protein.  Do not eat a lot of foods high in solid fats, added sugars, or salt.  Get regular exercise. This is one of the most important things you can do for your health.  Most adults should exercise for at least 150 minutes each week. The exercise should increase your heart rate and make you sweat (moderate-intensity exercise).  Most adults should also do strengthening exercises at least twice a week. This is in addition to the moderate-intensity exercise.  Maintain a healthy weight  Body mass index (BMI) is a measurement that can be used to identify possible weight problems. It estimates body fat based on height and weight. Your health care provider can help determine your BMI and help you achieve or maintain a healthy weight.  For females 42 years of age and older:   A BMI below 18.5 is considered underweight.  A BMI of 18.5 to 24.9 is normal.  A BMI of 25 to 29.9 is considered overweight.  A BMI of 30 and  above is considered obese.  Watch levels of cholesterol and blood lipids  You should start having your blood tested for lipids and cholesterol at 42 years of age, then have this test every 5 years.  You may need to have your cholesterol levels checked more often if:  Your lipid or cholesterol levels are high.  You are older than 42 years of age.  You are at high risk for heart disease.  CANCER SCREENING   Lung Cancer  Lung cancer screening is recommended for adults 42-42 years old who are at high risk for lung cancer because of a history of smoking.  A yearly low-dose CT scan of the lungs is recommended for people who:  Currently smoke.  Have quit within the past 15 years.  Have at least a 30-pack-year history of smoking. A pack year is smoking an average of one pack of cigarettes a day for 1 year.  Yearly screening should continue until it has been 15 years since you quit.  Yearly screening should stop if you develop a health problem that would prevent you from having lung cancer treatment.  Breast Cancer  Practice breast self-awareness. This means understanding how your breasts normally appear and feel.  It also means doing regular breast self-exams. Let your health care provider know about any changes, no matter how small.  If you are in your 42s or 42s, you should have a clinical  breast exam (CBE) by a health care provider every 1-3 years as part of a regular health exam.  If you are 42 or older, have a CBE every year. Also consider having a breast X-ray (mammogram) every year.  If you have a family history of breast cancer, talk to your health care provider about genetic screening.  If you are at high risk for breast cancer, talk to your health care provider about having an MRI and a mammogram every year.  Breast cancer gene (BRCA) assessment is recommended for women who have family members with BRCA-related cancers. BRCA-related cancers  include:  Breast.  Ovarian.  Tubal.  Peritoneal cancers.  Results of the assessment will determine the need for genetic counseling and BRCA1 and BRCA2 testing. Cervical Cancer Your health care provider may recommend that you be screened regularly for cancer of the pelvic organs (ovaries, uterus, and vagina). This screening involves a pelvic examination, including checking for microscopic changes to the surface of your cervix (Pap test). You may be encouraged to have this screening done every 3 years, beginning at age 42.  For women ages 42-42, health care providers may recommend pelvic exams and Pap testing every 3 years, or they may recommend the Pap and pelvic exam, combined with testing for human papilloma virus (HPV), every 5 years. Some types of HPV increase your risk of cervical cancer. Testing for HPV may also be done on women of any age with unclear Pap test results.  Other health care providers may not recommend any screening for nonpregnant women who are considered low risk for pelvic cancer and who do not have symptoms. Ask your health care provider if a screening pelvic exam is right for you.  If you have had past treatment for cervical cancer or a condition that could lead to cancer, you need Pap tests and screening for cancer for at least 20 years after your treatment. If Pap tests have been discontinued, your risk factors (such as having a new sexual partner) need to be reassessed to determine if screening should resume. Some women have medical problems that increase the chance of getting cervical cancer. In these cases, your health care provider may recommend more frequent screening and Pap tests. Colorectal Cancer  This type of cancer can be detected and often prevented.  Routine colorectal cancer screening usually begins at 42 years of age and continues through 42 years of age.  Your health care provider may recommend screening at an earlier age if you have risk factors for  colon cancer.  Your health care provider may also recommend using home test kits to check for hidden blood in the stool.  A small camera at the end of a tube can be used to examine your colon directly (sigmoidoscopy or colonoscopy). This is done to check for the earliest forms of colorectal cancer.  Routine screening usually begins at age 37.  Direct examination of the colon should be repeated every 5-10 years through 42 years of age. However, you may need to be screened more often if early forms of precancerous polyps or small growths are found. Skin Cancer  Check your skin from head to toe regularly.  Tell your health care provider about any new moles or changes in moles, especially if there is a change in a mole's shape or color.  Also tell your health care provider if you have a mole that is larger than the size of a pencil eraser.  Always use sunscreen. Apply sunscreen liberally and  repeatedly throughout the day.  Protect yourself by wearing long sleeves, pants, a wide-brimmed hat, and sunglasses whenever you are outside. HEART DISEASE, DIABETES, AND HIGH BLOOD PRESSURE   High blood pressure causes heart disease and increases the risk of stroke. High blood pressure is more likely to develop in:  People who have blood pressure in the high end of the normal range (130-139/85-89 mm Hg).  People who are overweight or obese.  People who are African American.  If you are 7-51 years of age, have your blood pressure checked every 3-5 years. If you are 24 years of age or older, have your blood pressure checked every year. You should have your blood pressure measured twice--once when you are at a hospital or clinic, and once when you are not at a hospital or clinic. Record the average of the two measurements. To check your blood pressure when you are not at a hospital or clinic, you can use:  An automated blood pressure machine at a pharmacy.  A home blood pressure monitor.  If you  are between 66 years and 21 years old, ask your health care provider if you should take aspirin to prevent strokes.  Have regular diabetes screenings. This involves taking a blood sample to check your fasting blood sugar level.  If you are at a normal weight and have a low risk for diabetes, have this test once every three years after 42 years of age.  If you are overweight and have a high risk for diabetes, consider being tested at a younger age or more often. PREVENTING INFECTION  Hepatitis B  If you have a higher risk for hepatitis B, you should be screened for this virus. You are considered at high risk for hepatitis B if:  You were born in a country where hepatitis B is common. Ask your health care provider which countries are considered high risk.  Your parents were born in a high-risk country, and you have not been immunized against hepatitis B (hepatitis B vaccine).  You have HIV or AIDS.  You use needles to inject street drugs.  You live with someone who has hepatitis B.  You have had sex with someone who has hepatitis B.  You get hemodialysis treatment.  You take certain medicines for conditions, including cancer, organ transplantation, and autoimmune conditions. Hepatitis C  Blood testing is recommended for:  Everyone born from [birth year] through 1963.  Anyone with known risk factors for hepatitis C. Sexually transmitted infections (STIs)  You should be screened for sexually transmitted infections (STIs) including gonorrhea and chlamydia if:  You are sexually active and are younger than 42 years of age.  You are older than 42 years of age and your health care provider tells you that you are at risk for this type of infection.  Your sexual activity has changed since you were last screened and you are at an increased risk for chlamydia or gonorrhea. Ask your health care provider if you are at risk.  If you do not have HIV, but are at risk, it may be recommended that you  take a prescription medicine daily to prevent HIV infection. This is called pre-exposure prophylaxis (PrEP). You are considered at risk if:  You are sexually active and do not regularly use condoms or know the HIV status of your partner(s).  You take drugs by injection.  You are sexually active with a partner who has HIV. Talk with your health care provider about whether you are at  high risk of being infected with HIV. If you choose to begin PrEP, you should first be tested for HIV. You should then be tested every 3 months for as long as you are taking PrEP.  PREGNANCY   If you are premenopausal and you may become pregnant, ask your health care provider about preconception counseling.  If you may become pregnant, take 400 to 800 micrograms (mcg) of folic acid every day.  If you want to prevent pregnancy, talk to your health care provider about birth control (contraception). OSTEOPOROSIS AND MENOPAUSE   Osteoporosis is a disease in which the bones lose minerals and strength with aging. This can result in serious bone fractures. Your risk for osteoporosis can be identified using a bone density scan.  If you are 29 years of age or older, or if you are at risk for osteoporosis and fractures, ask your health care provider if you should be screened.  Ask your health care provider whether you should take a calcium or vitamin D supplement to lower your risk for osteoporosis.  Menopause may have certain physical symptoms and risks.  Hormone replacement therapy may reduce some of these symptoms and risks. Talk to your health care provider about whether hormone replacement therapy is right for you.  HOME CARE INSTRUCTIONS   Schedule regular health, dental, and eye exams.  Stay current with your immunizations.   Do not use any tobacco products including cigarettes, chewing tobacco, or electronic cigarettes.  If you are pregnant, do not drink alcohol.  If you are breastfeeding, limit how  much and how often you drink alcohol.  Limit alcohol intake to no more than 1 drink per day for nonpregnant women. One drink equals 12 ounces of beer, 5 ounces of wine, or 1 ounces of hard liquor.  Do not use street drugs.  Do not share needles.  Ask your health care provider for help if you need support or information about quitting drugs.  Tell your health care provider if you often feel depressed.  Tell your health care provider if you have ever been abused or do not feel safe at home.   This information is not intended to replace advice given to you by your health care provider. Make sure you discuss any questions you have with your health care provider.   Document Released: 01/26/2011 Document Revised: 08/03/2014 Document Reviewed: 06/14/2013 Elsevier Interactive Patient Education [employer].

## 2015-05-03 NOTE — Assessment & Plan Note (Signed)
Previously has failed nasal corticosteroids. Will rx xyzal today and trial.

## 2015-05-03 NOTE — Progress Notes (Signed)
   Subjective:    Patient ID: Danielle Shaw, female    DOB: [birth year], 44 y.o.   MRN: [id number]  HPI The patient is a 44 YO female coming in for her blood pressure. She had been on blood pressure medicine in the past but lost insurance so has not been to a doctor in several years. She has not been exercising as she works 2 jobs to support herself. She is not having headaches, SOB, chest pains. She does occasionally get migraines and can tell the difference between these and the headaches she got in the past from blood pressure. Otherwise wants to make sure her health is good as there is family history of diabetes. Diet is mediocre.   PMH, [facility], social history reviewed and updated.   Review of Systems  Constitutional: Negative for fever, activity change, appetite change, fatigue and unexpected weight change.  HENT: Negative.   Eyes: Negative.   Respiratory: Negative for cough, chest tightness, shortness of breath and wheezing.   Cardiovascular: Negative for chest pain, palpitations and leg swelling.  Gastrointestinal: Negative for nausea, abdominal pain, diarrhea, constipation and abdominal distention.  Musculoskeletal: Positive for arthralgias. Negative for myalgias, back pain and joint swelling.  Skin: Negative.   Neurological: Negative.   Psychiatric/Behavioral: Negative.       Objective:   Physical Exam  Constitutional: She is oriented to person, place, and time. She appears well-developed and well-nourished.  Obese  HENT:  Head: Normocephalic and atraumatic.  Eyes: EOM are normal.  Neck: Normal range of motion.  Cardiovascular: Normal rate and regular rhythm.   Pulmonary/Chest: Effort normal and breath sounds normal. No respiratory distress. She has no wheezes. She has no rales.  Abdominal: Soft. Bowel sounds are normal. She exhibits no distension. There is no tenderness. There is no rebound.  Musculoskeletal: She exhibits no edema.  Neurological: She is alert and oriented to  person, place, and time. Coordination normal.  Skin: Skin is warm and dry.  Psychiatric: She has a normal mood and affect.   Filed Vitals:   05/03/15 1402  BP: 134/88  Pulse: 76  Temp: 98.2 F (36.8 C)  TempSrc: Oral  Resp: 16  Height: 5' 4.5" (1.638 m)  Weight: 335 lb 1.9 oz (152.009 kg)  SpO2: 97%      Assessment & Plan:  Flu shot given at visit.

## 2015-05-03 NOTE — Assessment & Plan Note (Signed)
Checking for complications with lipid panel, CMP, HgA1c today and address as needed. Talked to her about weight and overall health including the need for regular exercise.

## 2015-05-03 NOTE — Assessment & Plan Note (Signed)
Previously with elevated blood pressures. Today normotensive. Will observe off medication and she is working on losing weight and increasing exercise to help as well. She will periodically monitor at home and if >140/90 will call office or come back in for visit. Follow up every 6 months.

## 2015-05-16 ENCOUNTER — Other Ambulatory Visit: Payer: Self-pay | Admitting: Internal Medicine

## 2015-05-16 MED ORDER — ROSUVASTATIN CALCIUM 20 MG PO TABS: 20.0000 mg | ORAL_TABLET | Freq: Every day | ORAL | Status: DC

## 2015-05-30 ENCOUNTER — Ambulatory Visit (INDEPENDENT_AMBULATORY_CARE_PROVIDER_SITE_OTHER): Payer: BLUE CROSS/BLUE SHIELD | Admitting: Obstetrics and Gynecology

## 2015-05-30 ENCOUNTER — Encounter: Payer: Self-pay | Admitting: Obstetrics and Gynecology

## 2015-05-30 VITALS — BP 168/100 | HR 84 | Resp 18 | Ht 64.5 in | Wt 337.0 lb

## 2015-05-30 DIAGNOSIS — Z01419 Encounter for gynecological examination (general) (routine) without abnormal findings: Secondary | ICD-10-CM

## 2015-05-30 DIAGNOSIS — Z30431 Encounter for routine checking of intrauterine contraceptive device: Secondary | ICD-10-CM | POA: Diagnosis not present

## 2015-05-30 DIAGNOSIS — I10 Essential (primary) hypertension: Secondary | ICD-10-CM

## 2015-05-30 NOTE — Patient Instructions (Signed)

## 2015-05-30 NOTE — Progress Notes (Signed)
Patient ID: Danielle Shaw, female   DOB: [birth year], 44 y.o.   MRN: [id number] 44 y.o. [id number] SingleAfrican AmericanF here for annual exam. Patient has a skin tag in her vaginal area she is concerned about.  She has a h/o HTN, hasn't been on medication for a few years. She is has had a very stressful few months, working 16 hours a day. Her daughter just lost a child at 30 weeks. She has a mirena IUD, placed in 11/14. Irregular spotting with the IUD, no pain. She can't feel the IUD string. Occasional ovulatory pain, tolerable. Sexually active, same partner x 3 years, lives together.    Patient's last menstrual period was 05/21/2015.          Sexually active: Yes.    The current method of family planning is IUD.    Exercising: No.  The patient does not participate in regular exercise at present. Smoker:  no  Health Maintenance: Pap:  05-17-13 WNL NEG HR HPV History of abnormal Pap: yes years ago, denies a h/o dysplasia MMG:  09-11-09 abnormal- Right breast U/S had surgical excision   Colonoscopy:  Never BMD:   Never TDaP:  1-2 years ago Gardasil: N/A   reports that she has never smoked. She has never used smokeless tobacco. She reports that she does not drink alcohol or use illicit drugs. [place]. 1 grandchild, oldest daughter is 34, younger daughter is 44.   Past Medical History  Diagnosis Date  . IUD 2004  . IUD 2009  . Hypertension   . Arrhythmia   . GERD (gastroesophageal reflux disease)   . Hyperlipidemia   . Migraine     Past Surgical History  Procedure Laterality Date  . Breast surgery  2000    reduction  . Breast biopsy Right 2011    Current Outpatient Prescriptions  Medication Sig Dispense Refill  . aspirin 81 MG tablet Take 81 mg by mouth daily.     [name] levocetirizine (XYZAL) 5 MG tablet Take 1 tablet (5 mg total) by mouth every evening. 30 tablet 6  . pantoprazole (PROTONIX) 40 MG tablet Take 1 tablet (40 mg total) by mouth daily. 30 tablet  3  . rosuvastatin (CRESTOR) 20 MG tablet Take 1 tablet (20 mg total) by mouth daily. 90 tablet 3   No current facility-administered medications for this visit.    Family History  Problem Relation Age of Onset  . Heart disease Mother   . Diabetes Father   . Heart disease Father   . Heart disease Maternal Grandmother     Review of Systems  Constitutional: Negative.   HENT: Negative.   Eyes: Negative.   Respiratory: Negative.   Cardiovascular: Negative.   Gastrointestinal: Negative.   Endocrine: Negative.   Genitourinary: Negative.        Vaginal skin tag  Musculoskeletal: Negative.   Skin: Negative.   Allergic/Immunologic: Negative.   Neurological: Negative.   Psychiatric/Behavioral: Negative.     Exam:   BP 168/102 mmHg  Pulse 84  Resp 18  Ht 5' 4.5" (1.638 m)  Wt 337 lb (152.862 kg)  BMI 56.97 kg/m2  LMP 05/21/2015  Weight change: @WEIGHTCHANGE @ Height:   Height: 5' 4.5" (163.8 cm)  Ht Readings from Last 3 Encounters:  05/30/15 5' 4.5" (1.638 m)  05/03/15 5' 4.5" (1.638 m)  02/16/14 5\' 5"  (1.651 m)    General appearance: alert, cooperative and appears stated age Head: Normocephalic, without obvious abnormality, atraumatic Neck: no  adenopathy, supple, symmetrical, trachea midline and thyroid normal to inspection and palpation Lungs: clear to auscultation bilaterally Breasts: normal appearance, no masses or tenderness, bilateral breast reduction Heart: regular rate and rhythm Abdomen: soft, non-tender; bowel sounds normal; no masses,  no organomegaly Extremities: extremities normal, atraumatic, no cyanosis or edema Skin: Skin color, texture, turgor normal. No rashes or lesions Lymph nodes: Cervical, supraclavicular, and axillary nodes normal. No abnormal inguinal nodes palpated Neurologic: Grossly normal   Pelvic: External genitalia:  4 mm skin tag just below the left labia majora              Urethra:  normal appearing urethra with no masses, tenderness or  lesions              Bartholins and Skenes: normal                 Vagina: normal appearing vagina with normal color and discharge, no lesions              Cervix: no lesions and IUD string not seen               Bimanual Exam:  Uterus:  Exam limited secondary to BMI, no masses.               Adnexa: no mass, fullness, tenderness               Rectovaginal: Confirms               Anus:  normal sphincter tone, no lesions  Chaperone was present for exam.  A:  Well Woman with normal exam  HTN, elevated today, not currently on medication  Skin tag, not bothersome, can remove if it becomes symptomatic  Mirena IUD, string not seen  P:   No pap this year  Mammogram  Return for a gyn ultrasound to check the string  F/U with primary for BP

## 2015-06-10 ENCOUNTER — Telehealth: Payer: Self-pay | Admitting: Obstetrics and Gynecology

## 2015-06-10 NOTE — Telephone Encounter (Signed)
Reviewed benefit for pelvic ultrasound with patient. Patient understands and agreeable. Patient ready to schedule. Patient scheduled 06/18/15 @130  with Dr. Talbert Nan. Patient agreeable to arrival date/time. Patient agreeable to 72 hour cancellation policy with [id number] fee. No further questions. Ok to close.

## 2015-06-18 ENCOUNTER — Encounter: Payer: Self-pay | Admitting: Obstetrics and Gynecology

## 2015-06-18 ENCOUNTER — Ambulatory Visit (INDEPENDENT_AMBULATORY_CARE_PROVIDER_SITE_OTHER): Payer: BLUE CROSS/BLUE SHIELD | Admitting: Obstetrics and Gynecology

## 2015-06-18 ENCOUNTER — Ambulatory Visit (INDEPENDENT_AMBULATORY_CARE_PROVIDER_SITE_OTHER): Payer: BLUE CROSS/BLUE SHIELD

## 2015-06-18 VITALS — BP 162/100 | HR 88 | Resp 16 | Wt 333.0 lb

## 2015-06-18 DIAGNOSIS — Z30431 Encounter for routine checking of intrauterine contraceptive device: Secondary | ICD-10-CM

## 2015-06-18 DIAGNOSIS — D259 Leiomyoma of uterus, unspecified: Secondary | ICD-10-CM | POA: Diagnosis not present

## 2015-06-18 DIAGNOSIS — I10 Essential (primary) hypertension: Secondary | ICD-10-CM | POA: Diagnosis not present

## 2015-06-18 NOTE — Progress Notes (Signed)
     S: the patient's IUD strings were not seen at her recent annual exam. She has used the mirena for the last 12 years. This IUD was placed 2 years ago. She has only ever had spotting, over the last 4 months she has a monthly cycle x 7 days, with significant cramps. Bleeding is light. No dyspareunia. She is under lots of stress with her grown daughter who has lost a child.   Past Medical History  Diagnosis Date  . IUD 2004  . IUD 2009  . Hypertension   . Arrhythmia   . GERD (gastroesophageal reflux disease)   . Hyperlipidemia   . Migraine    Past Surgical History  Procedure Laterality Date  . Breast surgery  2000    reduction  . Breast biopsy Right 2011   Current Outpatient Prescriptions on File Prior to Visit  Medication Sig Dispense Refill  . aspirin 81 MG tablet Take 81 mg by mouth daily.     [name] levocetirizine (XYZAL) 5 MG tablet Take 1 tablet (5 mg total) by mouth every evening. 30 tablet 6  . pantoprazole (PROTONIX) 40 MG tablet Take 1 tablet (40 mg total) by mouth daily. 30 tablet 3  . rosuvastatin (CRESTOR) 20 MG tablet Take 1 tablet (20 mg total) by mouth daily. 90 tablet 3   No current facility-administered medications on file prior to visit.   Allergies  Allergen Reactions  . Latex   . Lisinopril   . Metoprolol      Review of Systems  Genitourinary:       Ovarian pain at times   All other systems reviewed and are negative.  Reviewed ultrasound pictures with the patient  O: General: alert female in NAD Blood pressure 162/100, pulse 88, resp. rate 16, weight 333 lb (151.048 kg), last menstrual period 05/21/2015.  A/P  1) IUD check, IUD string seen in her cervix, unable to clearly identify the IUD in her uterus. Fibroids impairing view  -Discussed with fibroids, particularly if in her cavity this can decrease the contraception effectiveness   -Discussed the option of pulling the IUD, possibly placing a new one. She is not a candidate for OCP's, declines  depo-provera and nexplanon. She has overall done very well with the mirena  over the last 12 years  -Discussed vasectomy for contraception  -Discussed possible office hysteroscopy or sonohysterogram to try and confirm the IUD is properly placed  2)HTN  -she will f/u with her primary MD  Spent approximately 20 minutes face to face, over 50% in counseling

## 2015-07-17 ENCOUNTER — Telehealth: Payer: Self-pay | Admitting: *Deleted

## 2015-07-17 NOTE — Telephone Encounter (Signed)
Call to patient to schedule in-office hysteroscopy with [facility] to visualize IUD. Brief discussion of procedure. Instructed to Take motrin 800 mg one hour prior with food. Appointment 08-05-15 at [facility] to provider for final review. Anything else needed?

## 2015-07-17 NOTE — Telephone Encounter (Signed)
Nothing else is needed. Thank you!!!

## 2015-08-02 ENCOUNTER — Other Ambulatory Visit: Payer: Self-pay | Admitting: *Deleted

## 2015-08-02 ENCOUNTER — Telehealth: Payer: Self-pay | Admitting: Obstetrics and Gynecology

## 2015-08-02 DIAGNOSIS — Z30431 Encounter for routine checking of intrauterine contraceptive device: Secondary | ICD-10-CM

## 2015-08-02 NOTE — Telephone Encounter (Signed)
Called patient to discuss benefits for a procedure. Left Voicemail requesting a call. °

## 2015-08-05 ENCOUNTER — Ambulatory Visit: Payer: BLUE CROSS/BLUE SHIELD | Admitting: Obstetrics and Gynecology

## 2015-08-06 ENCOUNTER — Ambulatory Visit (INDEPENDENT_AMBULATORY_CARE_PROVIDER_SITE_OTHER): Payer: BLUE CROSS/BLUE SHIELD | Admitting: Obstetrics and Gynecology

## 2015-08-06 ENCOUNTER — Ambulatory Visit (INDEPENDENT_AMBULATORY_CARE_PROVIDER_SITE_OTHER): Payer: BLUE CROSS/BLUE SHIELD

## 2015-08-06 ENCOUNTER — Encounter: Payer: Self-pay | Admitting: Obstetrics and Gynecology

## 2015-08-06 ENCOUNTER — Other Ambulatory Visit: Payer: Self-pay | Admitting: Obstetrics and Gynecology

## 2015-08-06 VITALS — BP 190/100 | HR 88 | Resp 18 | Wt 331.0 lb

## 2015-08-06 DIAGNOSIS — Z30431 Encounter for routine checking of intrauterine contraceptive device: Secondary | ICD-10-CM | POA: Diagnosis not present

## 2015-08-06 DIAGNOSIS — M545 Low back pain: Secondary | ICD-10-CM

## 2015-08-06 DIAGNOSIS — R1030 Lower abdominal pain, unspecified: Secondary | ICD-10-CM | POA: Diagnosis not present

## 2015-08-06 DIAGNOSIS — R3915 Urgency of urination: Secondary | ICD-10-CM | POA: Diagnosis not present

## 2015-08-06 DIAGNOSIS — R350 Frequency of micturition: Secondary | ICD-10-CM

## 2015-08-06 DIAGNOSIS — K5900 Constipation, unspecified: Secondary | ICD-10-CM | POA: Diagnosis not present

## 2015-08-06 NOTE — Progress Notes (Signed)
v GYNECOLOGY  VISIT   HPI: 45 y.o.   Single  African American  female   [id number] with No LMP recorded. Patient is not currently having periods (Reason: IUD).   here for pelvic U/S   She has been working crazy hours, that should be calming down. Moving locally in February. Living with her 1 year old and boyfriend. Older daughter isn't living there currently. At her annual visit she was only c/o intermittent ovulatory pain. She hasn't been able to lie on her lower stomach for a long time, uncomfortable.  Since mid November she has been having tenderness/pain in her lower back and stomach. Worse after she has been sitting a long time. If she is up and moving it's tolerable. The pain is present 3 out 4 days. It lasts for hours at a time. She has taken tylenol or ibuprofen, not helping. She has taken some of her hydrocodone (boyfriends) which helps her pain.  The pain moves from her lower back to lower abdomen. No fevers, some nausea, some emesis (thinks from her acid reflux). She has some constipation, has gone up to 2 weeks without a BM. She takes stool softeners. She c/o a 3 month h/o urinary frequency and urgency to void, voids normal amounts. No dysuria. No incontinence.  Sexually active, no pain. No significant pain with ultrasound today.   GYNECOLOGIC HISTORY: No LMP recorded. Patient is not currently having periods (Reason: IUD). Contraception:IUD Menopausal hormone therapy: None        OB History    Gravida Para Term Preterm AB TAB SAB Ectopic Multiple Living   2 2 2  0 0 0 0 0 0 2         Patient Active Problem List   Diagnosis Date Noted  . Allergic rhinitis 05/03/2015  . Right knee pain 04/27/2013  . Annual physical exam 07/15/2012  . Palpitations 05/11/2011  . Presence of intrauterine contraceptive device (IUD)   . Morbid obesity ([place]) 03/07/2007  . Hyperlipidemia 09/23/2006  . MIGRAINE, UNSPEC., W/O INTRACTABLE MIGRAINE 09/23/2006  . HYPERTENSION, BENIGN SYSTEMIC  09/23/2006    Past Medical History  Diagnosis Date  . IUD 2004  . IUD 2009  . Hypertension   . Arrhythmia   . GERD (gastroesophageal reflux disease)   . Hyperlipidemia   . Migraine     Past Surgical History  Procedure Laterality Date  . Breast surgery  2000    reduction  . Breast biopsy Right 2011    Current Outpatient Prescriptions  Medication Sig Dispense Refill  . aspirin 81 MG tablet Take 81 mg by mouth daily.     [name] levocetirizine (XYZAL) 5 MG tablet Take 1 tablet (5 mg total) by mouth every evening. 30 tablet 6  . pantoprazole (PROTONIX) 40 MG tablet Take 1 tablet (40 mg total) by mouth daily. 30 tablet 3  . rosuvastatin (CRESTOR) 20 MG tablet Take 1 tablet (20 mg total) by mouth daily. 90 tablet 3   No current facility-administered medications for this visit.     ALLERGIES: Latex; Lisinopril; and Metoprolol  Family History  Problem Relation Age of Onset  . Heart disease Mother   . Diabetes Father   . Heart disease Father   . Heart disease Maternal Grandmother     Social History   Social History  . Marital Status: Single    Spouse Name: N/A  . Number of Children: N/A  . Years of Education: N/A   Occupational History  . Not on file.  Social History Main Topics  . Smoking status: Never Smoker   . Smokeless tobacco: Never Used  . Alcohol Use: No  . Drug Use: No  . Sexual Activity:    Partners: Male   Other Topics Concern  . Not on file   Social History Narrative    Review of Systems  Constitutional: Negative.   HENT: Negative.   Eyes: Negative.   Respiratory: Negative.   Cardiovascular: Negative.   Gastrointestinal: Negative.   Genitourinary:       Right LLQ pain   Musculoskeletal: Negative.   Skin: Negative.   Neurological: Negative.   Endo/Heme/Allergies: Negative.   Psychiatric/Behavioral: Negative.     PHYSICAL EXAMINATION:    BP 190/100 mmHg  Pulse 88  Resp 18  Wt 331 lb (150.141 kg)    General appearance: alert,  cooperative and appears stated age Abdomen: soft, obese, mildly tender to palpation of the lower abdomen, increased tenderness when muscles are tensed and palpating lower abdomen Back: tender to palpation over her lower back  Pelvic: External genitalia:  no lesions              Cervix: equivocally tender              Bimanual Exam:  Uterus:  anteverted, mobile, mildly tender, exam limited by BMI. No masses.               Adnexa: No masses, minimally tender bilaterally Pelvic floor: not tender Bladder: mildly tender  Chaperone was present for exam.  ASSESSMENT IUD check, IUD seen on ultrasound today, in place Abdominal/back pain, suspect MS etiology Constipation, long term, severe, could also be contributing to pain Urinary frequency and urgency HTN, her BP has ranged from 162-190/100 for her last 3 visits. Normal at her primary MD's in October. Not on medication     PLAN ccua for ua, c&s F/U with her primary for her HTN and back/abdominal pain. I don't think her pain is from a GYN source Metamucil for constipation, if that doesn't work try miralax   An After Visit Summary was printed and given to the patient.  Over 15 minutes face to face time of which over 50% was spent in counseling.

## 2015-08-06 NOTE — Patient Instructions (Signed)
Try metamucil 1-3 x a day If that doesn't help then try miralax About Constipation  Constipation Overview Constipation is the most common gastrointestinal complaint - about 4 million Americans experience constipation and make 2.5 million physician visits a year to get help for the problem.  Constipation can occur when the colon absorbs too much water, the colon's muscle contraction is slow or sluggish, and/or there is delayed transit time through the colon.  The result is stool that is hard and dry.  Indicators of constipation include straining during bowel movements greater than 25% of the time, having fewer than three bowel movements per week, and/or the feeling of incomplete evacuation.  There are established guidelines ([place] II ) for defining constipation. A person needs to have two or more of the following symptoms for at least 12 weeks (not necessarily consecutive) in the preceding 12 months: . Straining in  greater than 25% of bowel movements . Lumpy or hard stools in greater than 25% of bowel movements . Sensation of incomplete emptying in greater than 25% of bowel movements . Sensation of anorectal obstruction/blockade in greater than 25% of bowel movements . Manual maneuvers to help empty greater than 25% of bowel movements (e.g., digital evacuation, support of the pelvic floor)  . Less than  3 bowel movements/week . Loose stools are not present, and criteria for irritable bowel syndrome are insufficient  Common Causes of Constipation . Lack of fiber in your diet . Lack of physical activity . Medications, including iron and calcium supplements  . Dairy intake . Dehydration . Abuse of laxatives  Travel  Irritable Bowel Syndrome  Pregnancy  Luteal phase of menstruation (after ovulation and before menses)  Colorectal problems  Intestinal Dysfunction  Treating Constipation  There are several ways of treating constipation, including changes to diet and exercise, use of  laxatives, adjustments to the pelvic floor, and scheduled toileting.  These treatments include: . increasing fiber and fluids in the diet  . increasing physical activity . learning muscle coordination   learning proper toileting techniques and toileting modifications   designing and sticking  to a toileting schedule     2007, [employer] Doc.22

## 2015-09-10 ENCOUNTER — Encounter: Payer: Self-pay | Admitting: Internal Medicine

## 2015-09-10 ENCOUNTER — Ambulatory Visit (INDEPENDENT_AMBULATORY_CARE_PROVIDER_SITE_OTHER): Payer: BLUE CROSS/BLUE SHIELD | Admitting: Internal Medicine

## 2015-09-10 VITALS — BP 130/90 | HR 82 | Temp 97.7°F | Ht 64.5 in | Wt 333.0 lb

## 2015-09-10 DIAGNOSIS — I10 Essential (primary) hypertension: Secondary | ICD-10-CM | POA: Diagnosis not present

## 2015-09-10 DIAGNOSIS — R062 Wheezing: Secondary | ICD-10-CM | POA: Diagnosis not present

## 2015-09-10 DIAGNOSIS — R059 Cough, unspecified: Secondary | ICD-10-CM | POA: Insufficient documentation

## 2015-09-10 DIAGNOSIS — R05 Cough: Secondary | ICD-10-CM | POA: Diagnosis not present

## 2015-09-10 MED ORDER — ALBUTEROL SULFATE HFA 108 (90 BASE) MCG/ACT IN AERS: 2.0000 | INHALATION_SPRAY | Freq: Four times a day (QID) | RESPIRATORY_TRACT | Status: DC | PRN

## 2015-09-10 MED ORDER — LEVOFLOXACIN 500 MG PO TABS: 500.0000 mg | ORAL_TABLET | Freq: Every day | ORAL | Status: DC

## 2015-09-10 MED ORDER — PREDNISONE 10 MG PO TABS: ORAL_TABLET | ORAL | Status: DC

## 2015-09-10 MED ORDER — HYDROCODONE-HOMATROPINE 5-1.5 MG/5ML PO SYRP: 5.0000 mL | ORAL_SOLUTION | Freq: Four times a day (QID) | ORAL | Status: DC | PRN

## 2015-09-10 NOTE — Progress Notes (Signed)
Pre visit review using our clinic review tool, if applicable. No additional management support is needed unless otherwise documented below in the visit note. 

## 2015-09-10 NOTE — Patient Instructions (Addendum)
Please take all new medication as prescribed -the antibiotic, cough medicine, prednisone, and Inhaler as needed  Please continue all other medications as before, and refills have been done if requested.  Please have the pharmacy call with any other refills you may need.  Please keep your appointments with your specialists as you may have planned

## 2015-09-11 NOTE — Assessment & Plan Note (Signed)
Mild to mod, c/w bronchospastic type,  for predpac course asd,  to f/u any worsening symptoms or concerns

## 2015-09-11 NOTE — Assessment & Plan Note (Signed)
Mild to mod, c/w bronchtiis vs pna, decliens cxr, for antibx course, cough med prn, to f/u any worsening symptoms or concerns

## 2015-09-11 NOTE — Progress Notes (Signed)
Subjective:    Patient ID: Danielle Shaw, female    DOB: 1971/05/06, 45 y.o.   MRN: [id number]  HPI   Here with acute onset mild to mod 2-3 days ST, HA, general weakness and malaise, with prod cough greenish sputum, but Pt denies chest pain, increased sob or doe, wheezing, orthopnea, PND, increased LE swelling, palpitations, dizziness or syncope, except for onset mild wheezing/sob since last PM.  Pt denies new neurological symptoms such as new headache, or facial or extremity weakness or numbness   Pt denies polydipsia, polyuria   Pt denies fever, wt loss, night sweats, loss of appetite, or other constitutional symptoms except for the above Past Medical History  Diagnosis Date  . IUD 2004  . IUD 2009  . Hypertension   . Arrhythmia   . GERD (gastroesophageal reflux disease)   . Hyperlipidemia   . Migraine    Past Surgical History  Procedure Laterality Date  . Breast surgery  2000    reduction  . Breast biopsy Right 2011    reports that she has never smoked. She has never used smokeless tobacco. She reports that she does not drink alcohol or use illicit drugs. family history includes Diabetes in her father; Heart disease in her father, maternal grandmother, and mother. Allergies  Allergen Reactions  . Latex   . Lisinopril   . Metoprolol    Current Outpatient Prescriptions on File Prior to Visit  Medication Sig Dispense Refill  . aspirin 81 MG tablet Take 81 mg by mouth daily.     [name] levocetirizine (XYZAL) 5 MG tablet Take 1 tablet (5 mg total) by mouth every evening. 30 tablet 6  . pantoprazole (PROTONIX) 40 MG tablet Take 1 tablet (40 mg total) by mouth daily. 30 tablet 3  . rosuvastatin (CRESTOR) 20 MG tablet Take 1 tablet (20 mg total) by mouth daily. 90 tablet 3   No current facility-administered medications on file prior to visit.    Review of Systems  Constitutional: Negative for unusual diaphoresis or night sweats HENT: Negative for ringing in ear or discharge Eyes:  Negative for double vision or worsening visual disturbance.  Respiratory: Negative for choking and stridor.   Gastrointestinal: Negative for vomiting or other signifcant bowel change Genitourinary: Negative for hematuria or change in urine volume.  Musculoskeletal: Negative for other MSK pain or swelling Skin: Negative for color change and worsening wound.  Neurological: Negative for tremors and numbness other than noted  Psychiatric/Behavioral: Negative for decreased concentration or agitation other than above       Objective:   Physical Exam BP 130/90 mmHg  Pulse 82  Temp(Src) 97.7 F (36.5 C) (Oral)  Ht 5' 4.5" (1.638 m)  Wt 333 lb (151.048 kg)  BMI 56.30 kg/m2  SpO2 95% VS noted, mild ill Constitutional: Pt appears in no significant distress HENT: Head: NCAT.  Right Ear: External ear normal.  Left Ear: External ear normal.  Bilat tm's with mild erythema.  Max sinus areas mild tender.  Pharynx with mild erythema, no exudate Eyes: . Pupils are equal, round, and reactive to light. Conjunctivae and EOM are normal Neck: Normal range of motion. Neck supple.  Cardiovascular: Normal rate and regular rhythm.   Pulmonary/Chest: Effort normal and breath sounds decreased without rales but few scattered wheezing.  Neurological: Pt is alert. Not confused , motor grossly intact Skin: Skin is warm. No rash, no LE edema Psychiatric: Pt behavior is normal. No agitation.     Assessment & Plan:

## 2015-09-11 NOTE — Assessment & Plan Note (Signed)
stable overall by history and exam, recent data reviewed with pt, and pt to continue medical treatment as before,  to f/u any worsening symptoms or concerns BP Readings from Last 3 Encounters:  09/10/15 130/90  08/06/15 190/100  06/18/15 162/100

## 2015-10-22 ENCOUNTER — Ambulatory Visit (INDEPENDENT_AMBULATORY_CARE_PROVIDER_SITE_OTHER)
Admission: RE | Admit: 2015-10-22 | Discharge: 2015-10-22 | Disposition: A | Discharge status: Home / Self Care | Payer: BLUE CROSS/BLUE SHIELD | Source: Ambulatory Visit | Attending: Internal Medicine | Admitting: Internal Medicine

## 2015-10-22 ENCOUNTER — Ambulatory Visit (INDEPENDENT_AMBULATORY_CARE_PROVIDER_SITE_OTHER): Payer: BLUE CROSS/BLUE SHIELD | Admitting: Internal Medicine

## 2015-10-22 ENCOUNTER — Encounter: Payer: Self-pay | Admitting: Internal Medicine

## 2015-10-22 VITALS — BP 158/102 | HR 90 | Temp 98.2°F | Resp 18 | Ht 64.5 in | Wt 343.1 lb

## 2015-10-22 DIAGNOSIS — I10 Essential (primary) hypertension: Secondary | ICD-10-CM | POA: Diagnosis not present

## 2015-10-22 DIAGNOSIS — R059 Cough, unspecified: Secondary | ICD-10-CM

## 2015-10-22 DIAGNOSIS — R05 Cough: Secondary | ICD-10-CM

## 2015-10-22 DIAGNOSIS — E785 Hyperlipidemia, unspecified: Secondary | ICD-10-CM | POA: Diagnosis not present

## 2015-10-22 IMAGING — DX DG CHEST 2V
2 series · 2 of 2 positions shown · non-contrast
Comparison: Chest x-ray of [DATE]

CLINICAL DATA: Nonproductive cough for 6 weeks associated with
chest and back pain; history of morbid obesity, former smoker.

EXAM:
CHEST  2 VIEW

[chest pa]
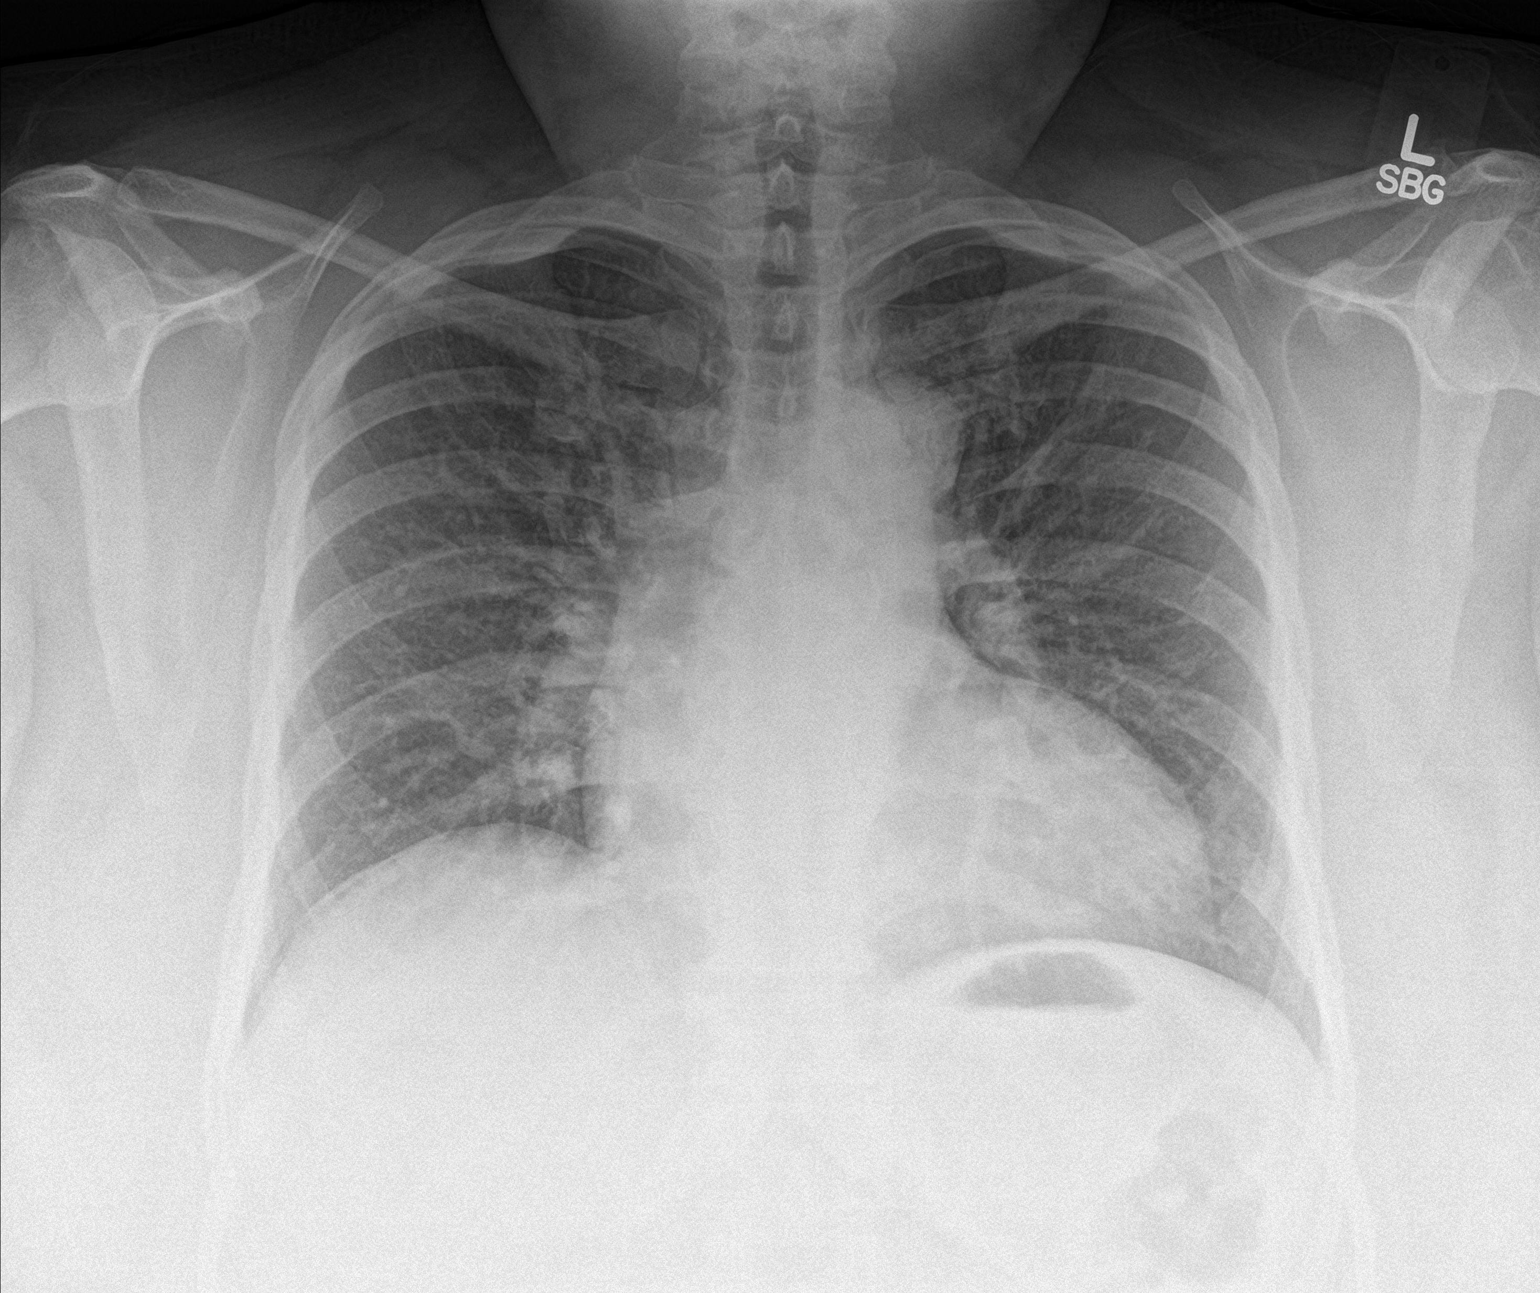

[chest lat]
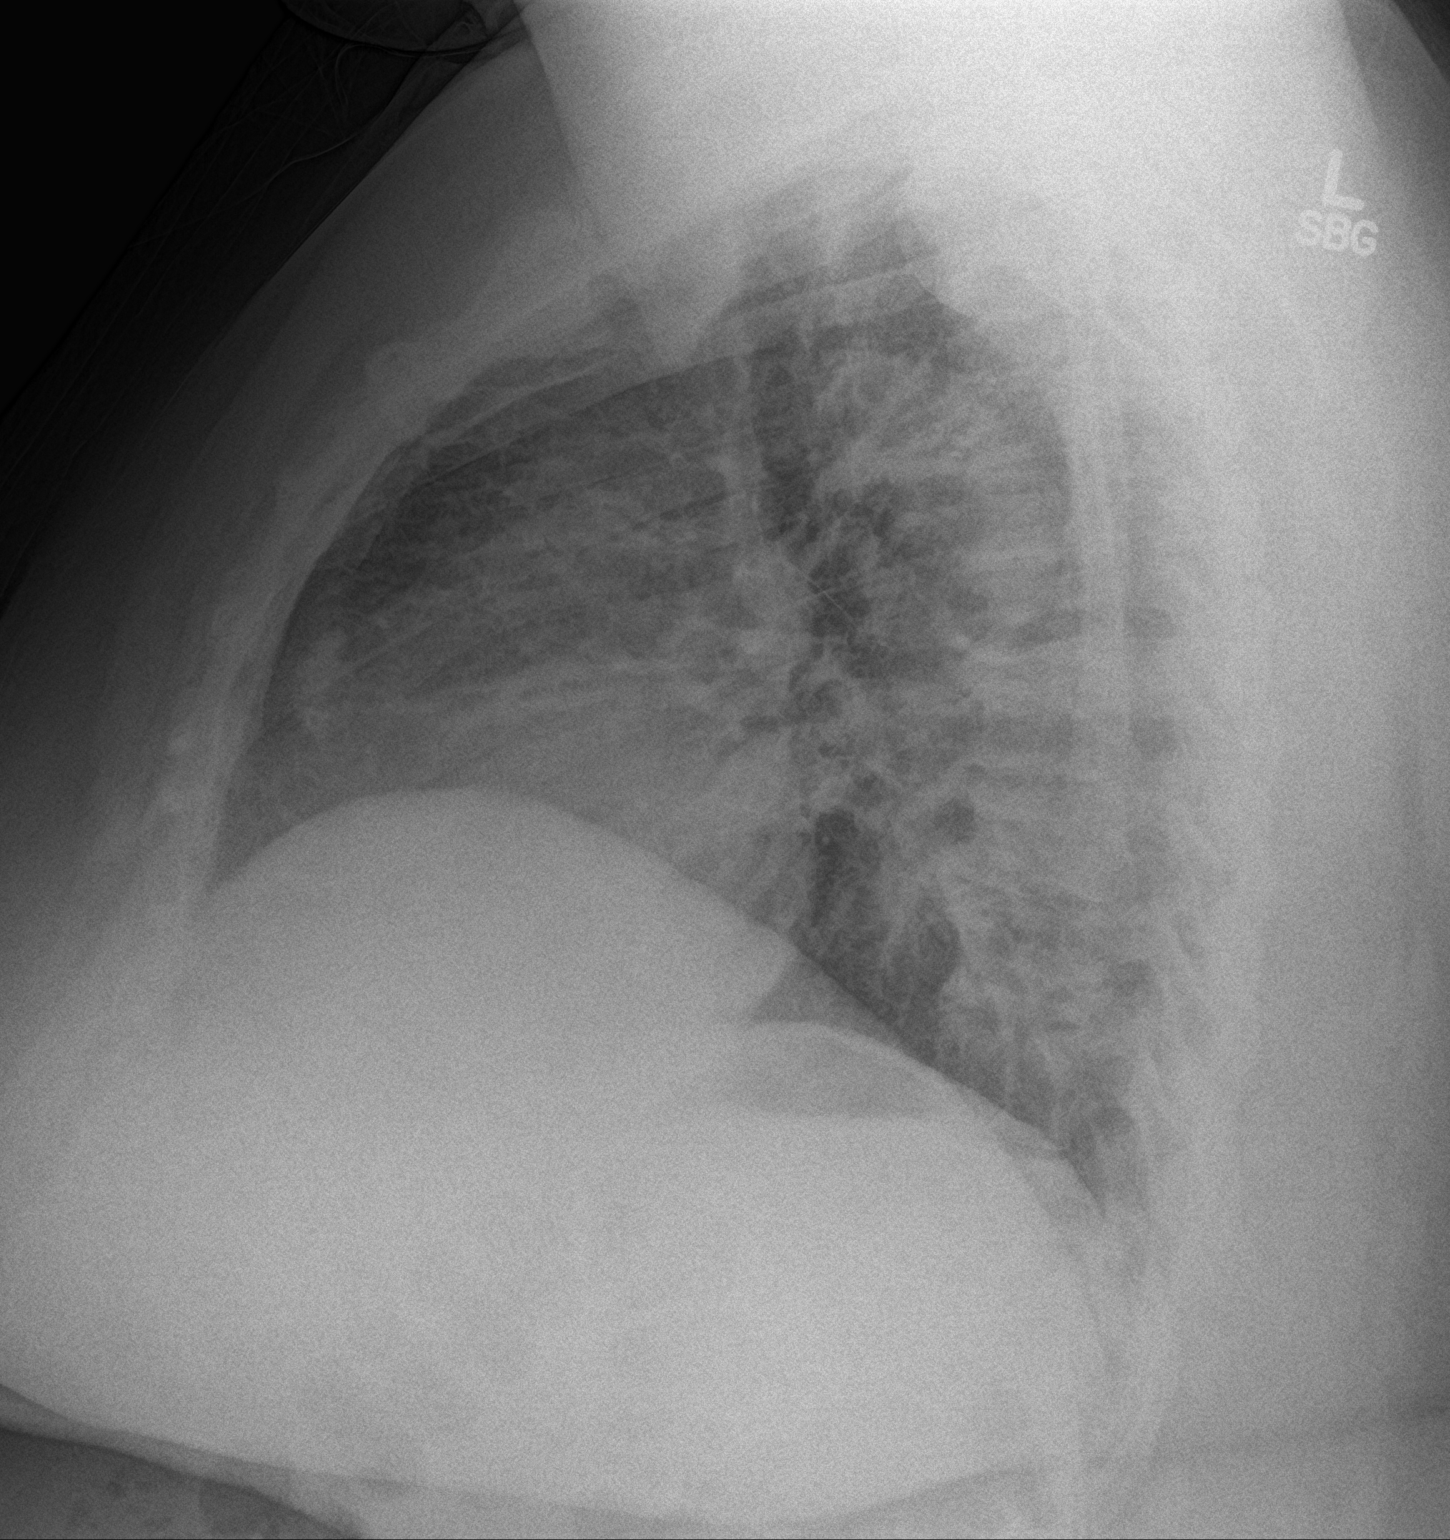

[2 of 2 positions shown; findings below may reference images not displayed]

FINDINGS: The lungs are better inflated today but remain borderline
hypoinflated. The interstitial markings are more prominent
bilaterally. The cardiac silhouette remains enlarged. The central
pulmonary vascularity is prominent. There is no pleural effusion.
The mediastinum is normal in width. There is increased conspicuity
of soft tissue at the level of the aortic knob. There is tortuosity
of the descending thoracic aorta. There is multilevel degenerative
disc disease of the thoracic spine.
IMPRESSION: 1. Mild pulmonary vascular congestion and mild cardiomegaly suggest
low-grade CHF. The findings are accentuated by borderline
hypoinflation.
2. Increased soft tissue density adjacent to the aortic arch more
conspicuous than in the past. While this may reflect a somewhat
tortuous aortic arch, lymphadenopathy or a medial pulmonary
parenchymal or pleural mass is not excluded. Chest CT scanning is
recommended.
3. These results will be called to the ordering clinician or
representative by the Radiologist Assistant, and communication
documented in the PACS or zVision Dashboard.

## 2015-10-22 MED ORDER — AMOXICILLIN-POT CLAVULANATE 875-125 MG PO TABS: 1.0000 | ORAL_TABLET | Freq: Two times a day (BID) | ORAL | Status: DC

## 2015-10-22 MED ORDER — LOSARTAN POTASSIUM-HCTZ 50-12.5 MG PO TABS: 1.0000 | ORAL_TABLET | Freq: Every day | ORAL | Status: DC

## 2015-10-22 MED ORDER — BENZONATATE 200 MG PO CAPS: 200.0000 mg | ORAL_CAPSULE | Freq: Two times a day (BID) | ORAL | Status: DC | PRN

## 2015-10-22 NOTE — Progress Notes (Signed)
   Subjective:    Patient ID: Danielle Shaw, female    DOB: 1970/10/17, 45 y.o.   MRN: [id number]  HPI The patient is a 45 YO female coming in for new and old problems today. She is having high blood pressure (has had in the past, used to be on medicine but had stopped with good BP, up 20 pounds since that time, denies headaches or chest pains, not taking OTC cold medicine), her cholesterol (taking crestor since last visit without side effects, needs recheck of her lipids, not complicated), and new problem of her sinuses (painful sinuses and drainage with congestion for 3-4 weeks, took medicine for cold last month which did not help, no fevers or chills, yellow sputum and nose drainage, no headaches, cough and mild to moderate SOB, no wheezing). Also having some musculoskeletal complaints which we are not able to address today.   Review of Systems  Constitutional: Negative for fever, activity change, appetite change, fatigue and unexpected weight change.  HENT: Positive for congestion, postnasal drip and rhinorrhea. Negative for sinus pressure and trouble swallowing.   Eyes: Negative.   Respiratory: Positive for cough and shortness of breath. Negative for chest tightness and wheezing.   Cardiovascular: Negative for chest pain, palpitations and leg swelling.  Gastrointestinal: Negative for nausea, abdominal pain, diarrhea, constipation and abdominal distention.  Musculoskeletal: Positive for arthralgias. Negative for myalgias, back pain and joint swelling.  Skin: Negative.   Neurological: Negative.   Psychiatric/Behavioral: Negative.       Objective:   Physical Exam  Constitutional: She is oriented to person, place, and time. She appears well-developed and well-nourished.  Obese  HENT:  Head: Normocephalic and atraumatic.  Oropharynx red with clear drainage, nasal turbinates with swelling and yellow crusting.   Eyes: EOM are normal.  Neck: Normal range of motion.  Cardiovascular: Normal  rate and regular rhythm.   Pulmonary/Chest: Effort normal and breath sounds normal. No respiratory distress. She has no wheezes. She has no rales.  Some mild wheezing at the bases  Abdominal: Soft. Bowel sounds are normal. She exhibits no distension. There is no tenderness. There is no rebound.  Musculoskeletal: She exhibits tenderness. She exhibits no edema.  Neurological: She is alert and oriented to person, place, and time. Coordination normal.  Skin: Skin is warm and dry.  Psychiatric: She has a normal mood and affect.   Filed Vitals:   10/22/15 1547  BP: 158/102  Pulse: 90  Temp: 98.2 F (36.8 C)  TempSrc: Oral  Resp: 18  Height: 5' 4.5" (1.638 m)  Weight: 343 lb 1.9 oz (155.638 kg)  SpO2: 95%      Assessment & Plan:

## 2015-10-22 NOTE — Patient Instructions (Signed)
We have sent in the losartan/hctz for the blood pressure. Take 1 pill daily to keep the pressure better.   We are checking the chest x-ray today and will send the results on mychart. We have sent in augmentin for the sinuses to take 1 pill twice a day with food for 10 days to help out. If there is something on the chest x-ray we may change this.   We have also sent in tessalon perles for the cough that you can use up to 3 times per day as needed for cough.   We are going to have you come before you eat in the morning to get the blood work checked.   Allergic Rhinitis Allergic rhinitis is when the mucous membranes in the nose respond to allergens. Allergens are particles in the air that cause your body to have an allergic reaction. This causes you to release allergic antibodies. Through a chain of events, these eventually cause you to release histamine into the blood stream. Although meant to protect the body, it is this release of histamine that causes your discomfort, such as frequent sneezing, congestion, and an itchy, runny nose.  CAUSES Seasonal allergic rhinitis (hay fever) is caused by pollen allergens that may come from grasses, trees, and weeds. Year-round allergic rhinitis (perennial allergic rhinitis) is caused by allergens such as house dust mites, pet dander, and mold spores. SYMPTOMS  Nasal stuffiness (congestion).  Itchy, runny nose with sneezing and tearing of the eyes. DIAGNOSIS Your health care provider can help you determine the allergen or allergens that trigger your symptoms. If you and your health care provider are unable to determine the allergen, skin or blood testing may be used. Your health care provider will diagnose your condition after taking your health history and performing a physical exam. Your health care provider may assess you for other related conditions, such as asthma, pink eye, or an ear infection. TREATMENT Allergic rhinitis does not have a cure, but it can  be controlled by:  Medicines that block allergy symptoms. These may include allergy shots, nasal sprays, and oral antihistamines.  Avoiding the allergen. Hay fever may often be treated with antihistamines in pill or nasal spray forms. Antihistamines block the effects of histamine. There are over-the-counter medicines that may help with nasal congestion and swelling around the eyes. Check with your health care provider before taking or giving this medicine. If avoiding the allergen or the medicine prescribed do not work, there are many new medicines your health care provider can prescribe. Stronger medicine may be used if initial measures are ineffective. Desensitizing injections can be used if medicine and avoidance does not work. Desensitization is when a patient is given ongoing shots until the body becomes less sensitive to the allergen. Make sure you follow up with your health care provider if problems continue. HOME CARE INSTRUCTIONS It is not possible to completely avoid allergens, but you can reduce your symptoms by taking steps to limit your exposure to them. It helps to know exactly what you are allergic to so that you can avoid your specific triggers. SEEK MEDICAL CARE IF:  You have a fever.  You develop a cough that does not stop easily (persistent).  You have shortness of breath.  You start wheezing.  Symptoms interfere with normal daily activities.   This information is not intended to replace advice given to you by your health care provider. Make sure you discuss any questions you have with your health care provider.  Document Released: 04/07/2001 Document Revised: 08/03/2014 Document Reviewed: 03/20/2013 Elsevier Interactive Patient Education [employer].

## 2015-10-22 NOTE — Progress Notes (Signed)
Pre visit review using our clinic review tool, if applicable. No additional management support is needed unless otherwise documented below in the visit note. 

## 2015-10-23 ENCOUNTER — Other Ambulatory Visit: Payer: Self-pay | Admitting: Internal Medicine

## 2015-10-23 DIAGNOSIS — R9389 Abnormal findings on diagnostic imaging of other specified body structures: Secondary | ICD-10-CM

## 2015-10-23 NOTE — Assessment & Plan Note (Signed)
Checking lipid panel and adjust her crestor 20 mg daily if needed.

## 2015-10-23 NOTE — Assessment & Plan Note (Signed)
Rx for tessalon perles and treating for sinus infection with chest x-ray for symptoms ongoing for about 4-5 weeks now with sputum. Rx for augmentin.

## 2015-10-23 NOTE — Assessment & Plan Note (Signed)
BP is elevated today and she needs to go back on medicine. Rx for losartan/hctz which she has done well with in the past. Checking labs today.

## 2015-10-29 ENCOUNTER — Ambulatory Visit: Payer: BLUE CROSS/BLUE SHIELD | Admitting: Internal Medicine

## 2015-11-07 ENCOUNTER — Other Ambulatory Visit: Payer: BLUE CROSS/BLUE SHIELD

## 2015-11-11 ENCOUNTER — Other Ambulatory Visit: Payer: BLUE CROSS/BLUE SHIELD

## 2015-11-12 ENCOUNTER — Ambulatory Visit
Admission: RE | Admit: 2015-11-12 | Discharge: 2015-11-12 | Disposition: A | Discharge status: Home / Self Care | Payer: BLUE CROSS/BLUE SHIELD | Source: Ambulatory Visit | Attending: Internal Medicine | Admitting: Internal Medicine

## 2015-11-12 DIAGNOSIS — R918 Other nonspecific abnormal finding of lung field: Secondary | ICD-10-CM | POA: Diagnosis not present

## 2015-11-12 DIAGNOSIS — R9389 Abnormal findings on diagnostic imaging of other specified body structures: Secondary | ICD-10-CM

## 2015-11-12 IMAGING — CT CT CHEST W/ CM
2 of 4 series · 15 of 36 positions shown, 18 images · IV contrast (APPLIED)
Comparison: Chest x-ray of [DATE]

CLINICAL DATA: Abnormal opacity overlying the aortic arch on chest
x-ray

EXAM:
CT CHEST WITH CONTRAST
TECHNIQUE: Multidetector CT imaging of the chest was performed during
intravenous contrast administration.
CONTRAST:  75mL [P5] IOPAMIDOL ([P5]) INJECTION 61%

[Series 2: chest w/cm · axial · 0.63mm/px · z∈[+1053,+1283]mm · 12 of 56 slices shown, 15 images]
[im 5/56  mediastinal]
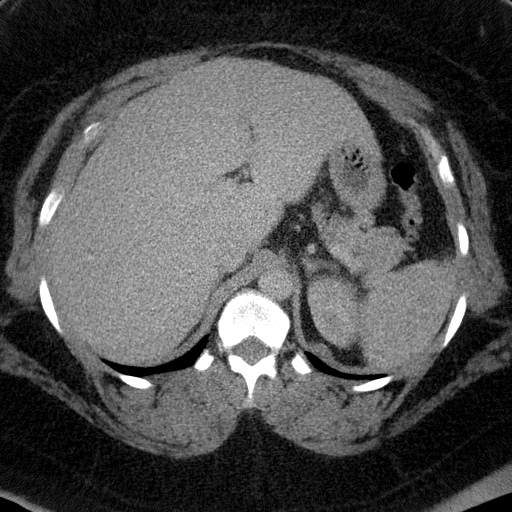
[im 5/56  lung]
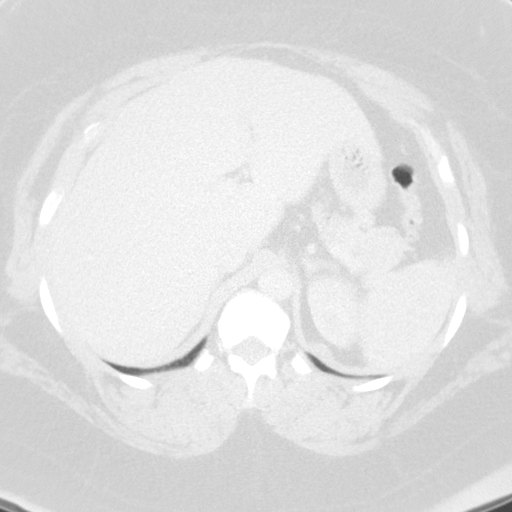
[im 9/56  lung]
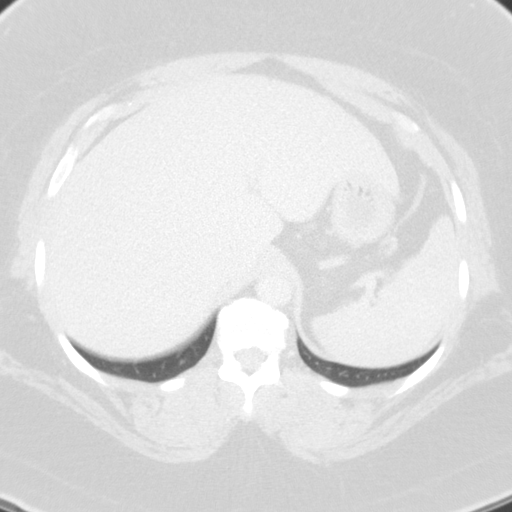
[im 13/56  lung]
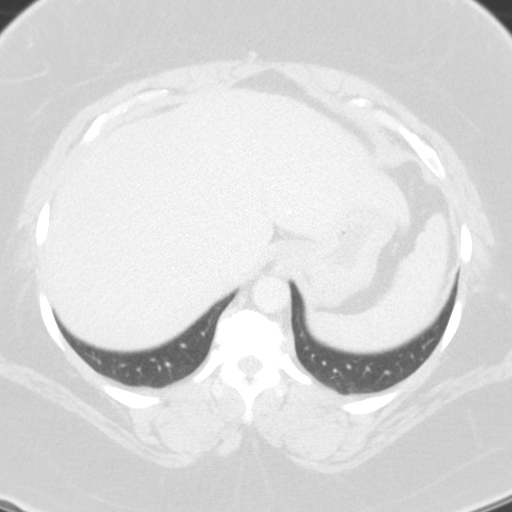
[im 17/56  lung]
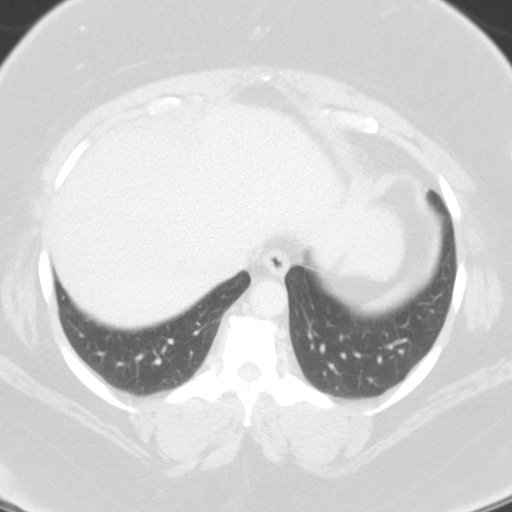
[im 22/56  mediastinal]
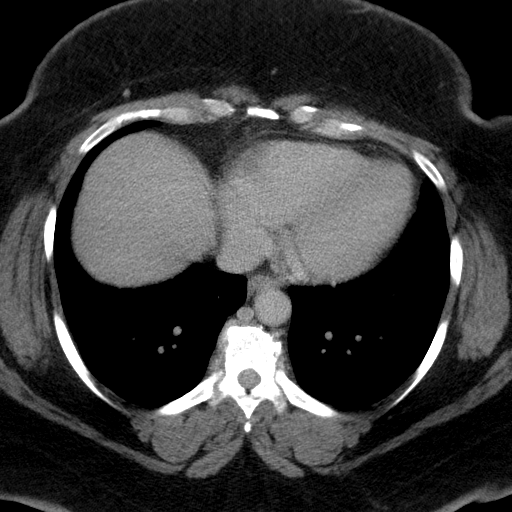
[im 22/56  lung]
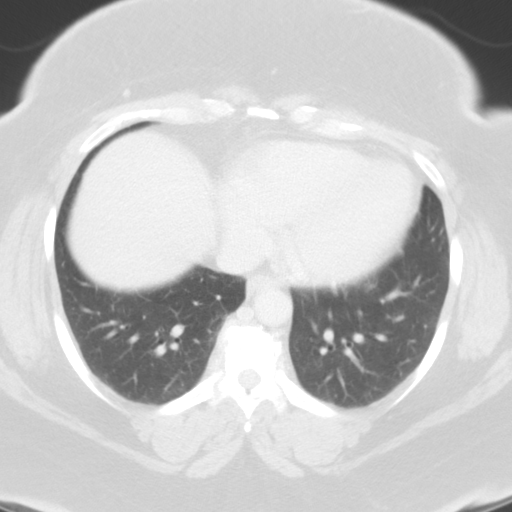
[im 26/56  lung]
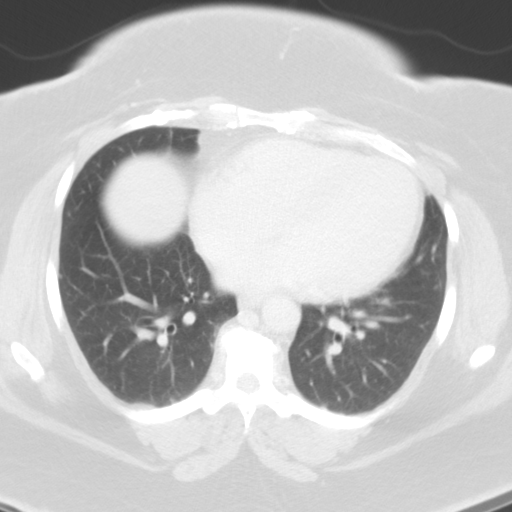
[im 30/56  lung]
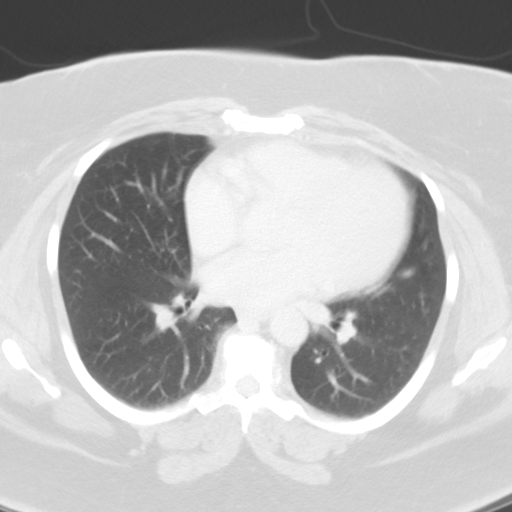
[im 34/56  lung]
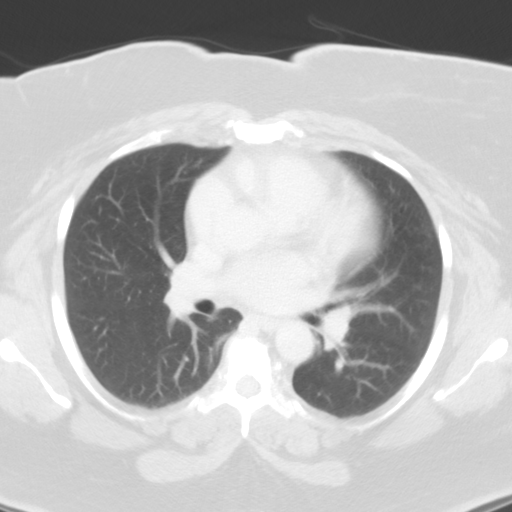
[im 39/56  mediastinal]
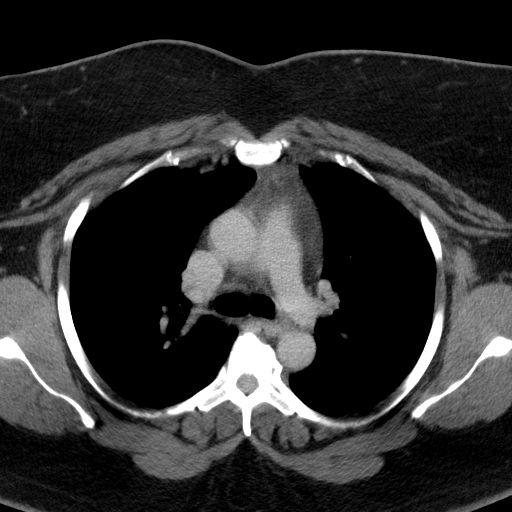
[im 39/56  lung]
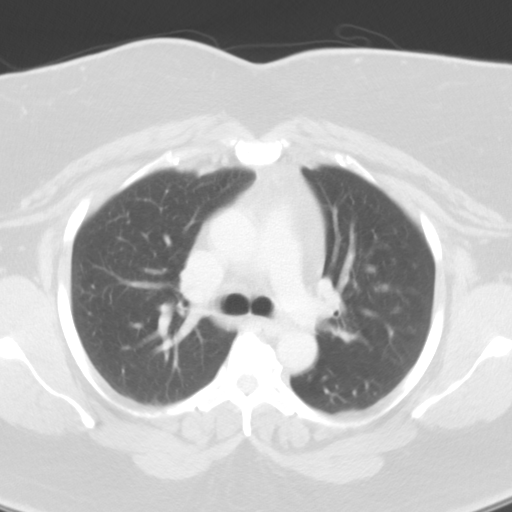
[im 43/56  lung]
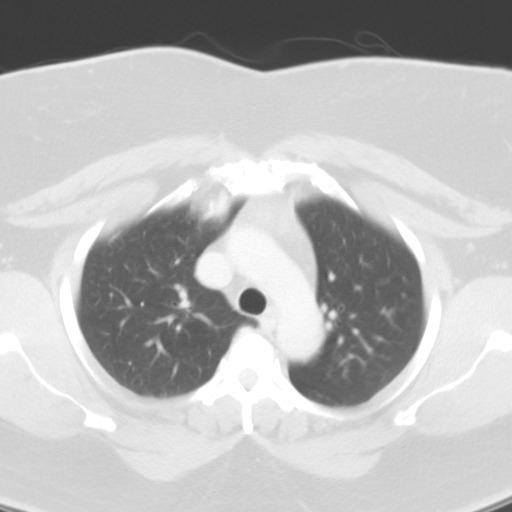
[im 47/56  lung]
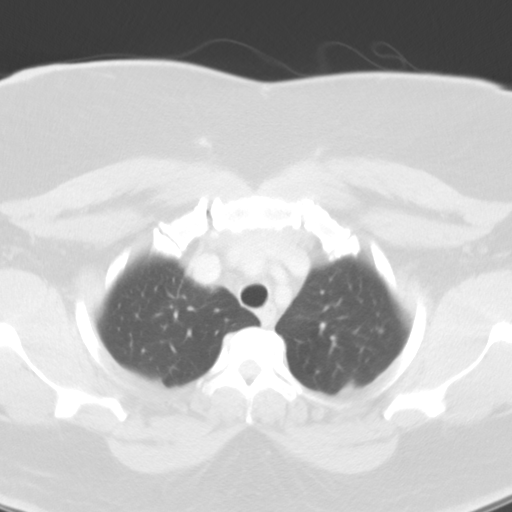
[im 51/56  lung]
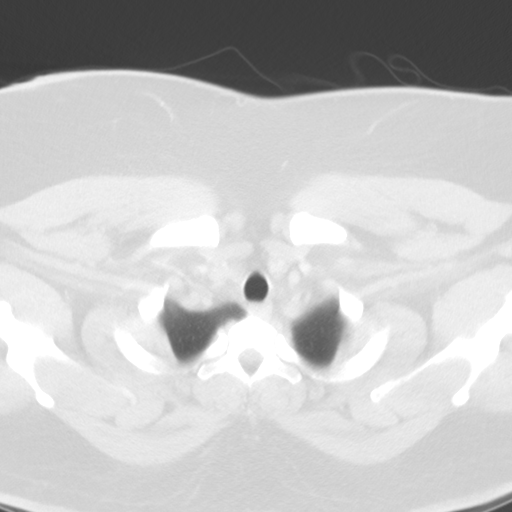

[Series 3: cor · coronal · 0.55mm/px · 3 of 94 slices shown]
[im 19/94  lung]
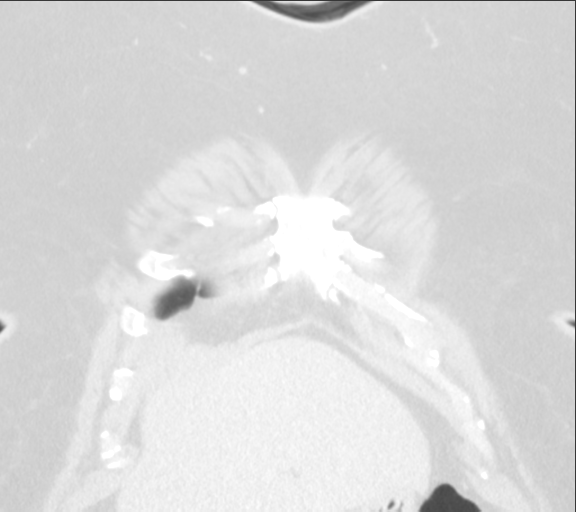
[im 38/94  lung]
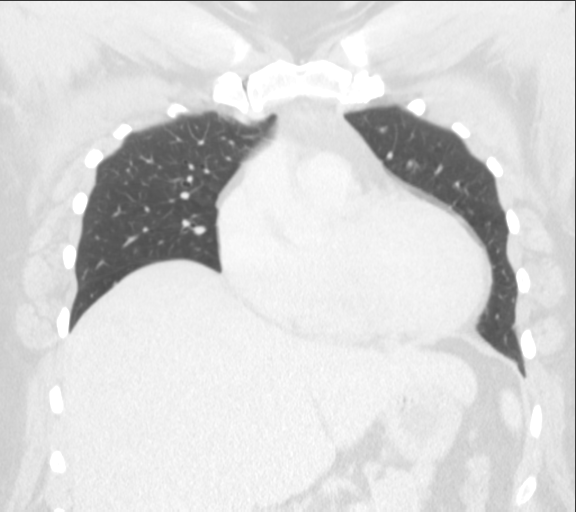
[im 56/94  lung]
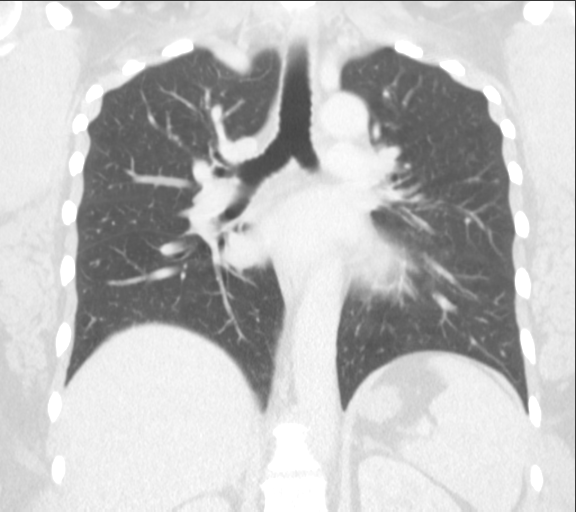

[15 of 36 positions shown; findings below may reference images not displayed]

FINDINGS: On soft tissue window images, the opacity questioned overlying the
aortic arch laterally on recent chest x-ray appears to be due to
prominent mediastinal fat. Minimal strandiness is noted within some
of the more anterior mediastinal fat but no soft tissue mass is
seen. No abnormal soft tissue lesion or aneurysmal dilatation is
seen. There is mild cardiomegaly present. The pulmonary arteries are
unremarkable. The ascending aorta is normal in caliber. No
mediastinal or hilar adenopathy is seen. The portion of the upper
abdomen that is visualized is unremarkable. The thyroid gland is
unremarkable.

On lung window images, no parenchymal infiltrate or effusion is
seen. On image 24 there is a somewhat flattened nodular opacity of 7
mm in diameter. This probably represents a perifissural lymph node.
Two adjacent nodular opacities on image 26 in the right middle lobe
are of doubtful significance. A 3 mm subpleural nodule is present in
the left lower lobe on image 33 with a pleural-based nodular opacity
on image 37 of 6 mm in diameter. No follow-up needed if patient is
low-risk (and has no known or suspected primary neoplasm).
Non-contrast chest CT can be considered in 12 months if patient is
high-risk. This recommendation follows the consensus statement:
Guidelines for Management of Incidental Pulmonary Nodules Detected
on CT Images:From the [HOSPITAL] [P5]; published online
before print (10.1148/radiol.[PHONE_NUMBER]). The thoracic vertebrae are
in normal alignment. There are degenerative changes throughout the
mid to lower thoracic spine.
IMPRESSION: 1. The opacity overlying the aortic knob on frontal chest x-ray
recently appears to be due to prominent mediastinal fat with no mass
or aneurysmal dilatation noted. No mediastinal or hilar adenopathy
is seen.
2. Small noncalcified lung nodules as described above. The followup
recommendations are given above.

## 2015-11-12 MED ORDER — IOPAMIDOL (ISOVUE-300) INJECTION 61%
75.0000 mL | Freq: Once | INTRAVENOUS | Status: AC | PRN
  Administered 2015-11-12: 75 mL via INTRAVENOUS

## 2016-01-21 ENCOUNTER — Encounter: Payer: Self-pay | Admitting: Internal Medicine

## 2016-01-21 ENCOUNTER — Ambulatory Visit (INDEPENDENT_AMBULATORY_CARE_PROVIDER_SITE_OTHER): Payer: BLUE CROSS/BLUE SHIELD | Admitting: Internal Medicine

## 2016-01-21 ENCOUNTER — Other Ambulatory Visit (INDEPENDENT_AMBULATORY_CARE_PROVIDER_SITE_OTHER): Payer: BLUE CROSS/BLUE SHIELD

## 2016-01-21 VITALS — BP 172/102 | HR 83 | Temp 98.5°F | Resp 16 | Ht 64.5 in | Wt 336.0 lb

## 2016-01-21 DIAGNOSIS — I10 Essential (primary) hypertension: Secondary | ICD-10-CM | POA: Diagnosis not present

## 2016-01-21 DIAGNOSIS — R519 Headache, unspecified: Secondary | ICD-10-CM

## 2016-01-21 DIAGNOSIS — E785 Hyperlipidemia, unspecified: Secondary | ICD-10-CM

## 2016-01-21 DIAGNOSIS — R05 Cough: Secondary | ICD-10-CM

## 2016-01-21 DIAGNOSIS — R51 Headache: Secondary | ICD-10-CM

## 2016-01-21 DIAGNOSIS — R0683 Snoring: Secondary | ICD-10-CM

## 2016-01-21 DIAGNOSIS — J3089 Other allergic rhinitis: Secondary | ICD-10-CM

## 2016-01-21 DIAGNOSIS — R059 Cough, unspecified: Secondary | ICD-10-CM

## 2016-01-21 LAB — LIPID PANEL
CHOL/HDL RATIO: 6
Cholesterol: 258 mg/dL — ABNORMAL HIGH (ref 0–200)
HDL: 40.9 mg/dL (ref >39.00)
LDL CALC: 191 mg/dL — AB (ref 0–99)
NONHDL: 217.51
Triglycerides: 131 mg/dL (ref 0.0–149.0)
VLDL: 26.2 mg/dL (ref 0.0–40.0)

## 2016-01-21 LAB — COMPREHENSIVE METABOLIC PANEL
ALT: 10 U/L (ref 0–35)
AST: 14 U/L (ref 0–37)
Albumin: 4.4 g/dL (ref 3.5–5.2)
Alkaline Phosphatase: 101 U/L (ref 39–117)
BUN: 8 mg/dL (ref 6–23)
CHLORIDE: 100 meq/L (ref 96–112)
CO2: 32 meq/L (ref 19–32)
Calcium: 9.9 mg/dL (ref 8.4–10.5)
Creatinine, Ser: 0.69 mg/dL (ref 0.40–1.20)
GFR: 119.25 mL/min (ref >60.00)
GLUCOSE: 91 mg/dL (ref 70–99)
POTASSIUM: 3.4 meq/L — AB (ref 3.5–5.1)
SODIUM: 137 meq/L (ref 135–145)
Total Bilirubin: 0.6 mg/dL (ref 0.2–1.2)
Total Protein: 8.5 g/dL — ABNORMAL HIGH (ref 6.0–8.3)

## 2016-01-21 LAB — TROPONIN I: TNIDX: 0 ug/L (ref 0.00–0.06)

## 2016-01-21 LAB — HEMOGLOBIN A1C: Hgb A1c MFr Bld: 6 % (ref 4.6–6.5)

## 2016-01-21 MED ORDER — IPRATROPIUM BROMIDE 0.03 % NA SOLN: 2.0000 | Freq: Two times a day (BID) | NASAL | Status: DC

## 2016-01-21 NOTE — Progress Notes (Signed)
   Subjective:    Patient ID: Danielle Shaw, female    DOB: 1971-02-18, 45 y.o.   MRN: [id number]  HPI The patient is a 45 YO female coming in for follow up of her blood pressure (up at home for the last 2 weeks, denies new OTC meds, taking some naprxoen at home for pain and headaches, still taking her losartan/hctz daily). She is having headaches in the morning for the last 1-2 months that are mild to moderate and fade as the day goes on. She does snore. Feels tired and sleepy a lot during the day. She is also following up on her weight (down about 10 pounds since last visit but still above her goal, not exercising right now). She is also having acute problem of nose congestion and sinus pressure. She is using xyzal right now which is helping some. Tried taking flonase in the past but it caused her some eye side effects and she did not like how that made her feel. Over the last 1-2 weeks. No fevers or chills.   Review of Systems  Constitutional: Negative for fever, activity change, appetite change, fatigue and unexpected weight change.  HENT: Positive for congestion, postnasal drip and rhinorrhea. Negative for sinus pressure and trouble swallowing.   Eyes: Negative.   Respiratory: Positive for cough. Negative for chest tightness, shortness of breath and wheezing.   Cardiovascular: Negative for chest pain, palpitations and leg swelling.  Gastrointestinal: Negative for nausea, abdominal pain, diarrhea, constipation and abdominal distention.  Musculoskeletal: Positive for arthralgias. Negative for myalgias, back pain and joint swelling.  Skin: Negative.   Neurological: Positive for headaches. Negative for dizziness, seizures, speech difficulty, weakness and numbness.  Psychiatric/Behavioral: Positive for sleep disturbance.       Tired during the day      Objective:   Physical Exam  Constitutional: She is oriented to person, place, and time. She appears well-developed and well-nourished.  Obese   HENT:  Head: Normocephalic and atraumatic.  Oropharynx red with clear drainage, no sinus pressure  Eyes: EOM are normal.  Neck: Normal range of motion.  Cardiovascular: Normal rate and regular rhythm.   Pulmonary/Chest: Effort normal and breath sounds normal. No respiratory distress. She has no wheezes. She has no rales.  Abdominal: Soft. Bowel sounds are normal. She exhibits no distension. There is no tenderness. There is no rebound.  Musculoskeletal: She exhibits tenderness. She exhibits no edema.  Neurological: She is alert and oriented to person, place, and time. Coordination normal.  Skin: Skin is warm and dry.   Filed Vitals:   01/21/16 1611  BP: 172/102  Pulse: 83  Temp: 98.5 F (36.9 C)  TempSrc: Oral  Resp: 16  Height: 5' 4.5" (1.638 m)  Weight: 336 lb (152.409 kg)  SpO2: 99%      Assessment & Plan:

## 2016-01-21 NOTE — Patient Instructions (Signed)
Get the blood work done today and we will send the results on mychart.   We have ordered the home sleep test so you will get a call about doing that. This could be causing the morning headaches.   If the blood pressure continues to be high we may need to add another medicine for the pressure as it can cause problems being home for some time.  We have sent in the nose spray to use up to 3 times a day as needed to help with the drainage and swelling in your nose.

## 2016-01-21 NOTE — Progress Notes (Signed)
Pre visit review using our clinic review tool, if applicable. No additional management support is needed unless otherwise documented below in the visit note. 

## 2016-01-22 DIAGNOSIS — R0683 Snoring: Secondary | ICD-10-CM | POA: Insufficient documentation

## 2016-01-22 NOTE — Assessment & Plan Note (Signed)
Rx for ipratropium nose spray for symptoms along with continuing her xyzal.

## 2016-01-22 NOTE — Assessment & Plan Note (Signed)
Weight is down some but she is not exercising and we discussed portions and exercise as ways to help her continue with her weight loss.

## 2016-01-22 NOTE — Assessment & Plan Note (Signed)
Advised her that we need to increase her BP meds but she declines feeling that her BP is going too low in the evening. Taking losartan/hctz 50/12.5 and would increase to 100/25 for next med change. She agrees to change if not decreased at next check.

## 2016-01-22 NOTE — Assessment & Plan Note (Signed)
She also is having snoring and tiredness during the day. Needs sleep study as she is morbidly obese and likely has OSA. This could be causing her BP to be more labile.

## 2016-01-24 ENCOUNTER — Other Ambulatory Visit: Payer: Self-pay | Admitting: Internal Medicine

## 2016-01-24 MED ORDER — SIMVASTATIN 40 MG PO TABS: 40.0000 mg | ORAL_TABLET | Freq: Every day | ORAL | Status: DC

## 2016-04-24 ENCOUNTER — Other Ambulatory Visit: Payer: Self-pay | Admitting: Internal Medicine

## 2016-04-24 DIAGNOSIS — Z1231 Encounter for screening mammogram for malignant neoplasm of breast: Secondary | ICD-10-CM

## 2016-05-01 ENCOUNTER — Ambulatory Visit
Admission: RE | Admit: 2016-05-01 | Discharge: 2016-05-01 | Disposition: A | Discharge status: Home / Self Care | Payer: BLUE CROSS/BLUE SHIELD | Source: Ambulatory Visit | Attending: Internal Medicine | Admitting: Internal Medicine

## 2016-05-01 DIAGNOSIS — Z1231 Encounter for screening mammogram for malignant neoplasm of breast: Secondary | ICD-10-CM | POA: Diagnosis not present

## 2016-05-01 IMAGING — MG 2D DIGITAL SCREENING BILATERAL MAMMOGRAM WITH CAD AND ADJUNCT TO
8 of 13 series · 8 of 29 positions shown · non-contrast
Comparison: Previous exam(s).

ACR Breast Density Category a: The breast tissue is almost entirely
fatty.

CLINICAL DATA: Screening.

EXAM:
2D DIGITAL SCREENING BILATERAL MAMMOGRAM WITH CAD AND ADJUNCT TOMO

[L CV]
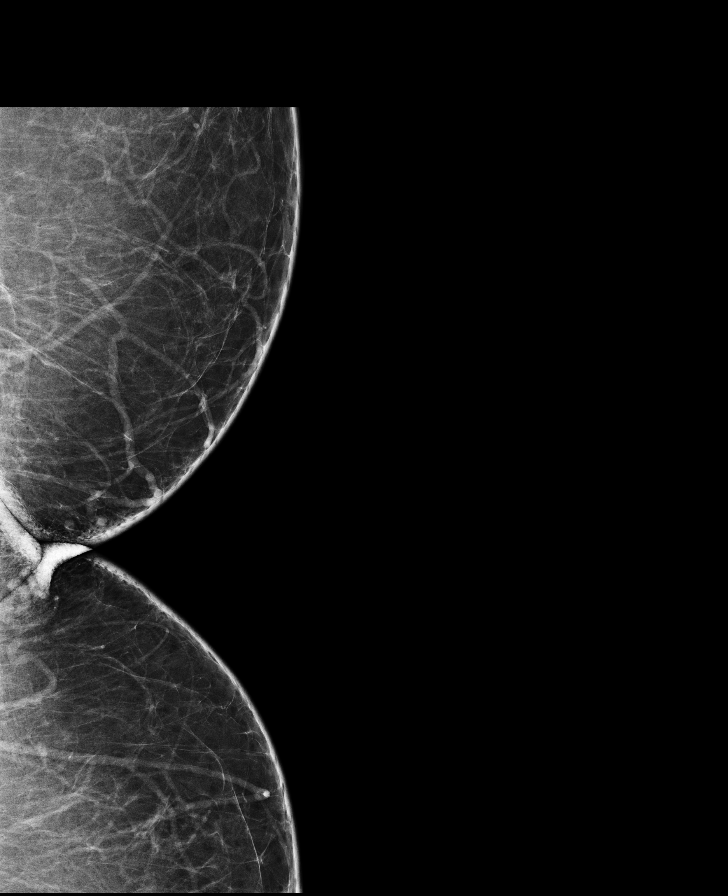

[L CC synth-2D]
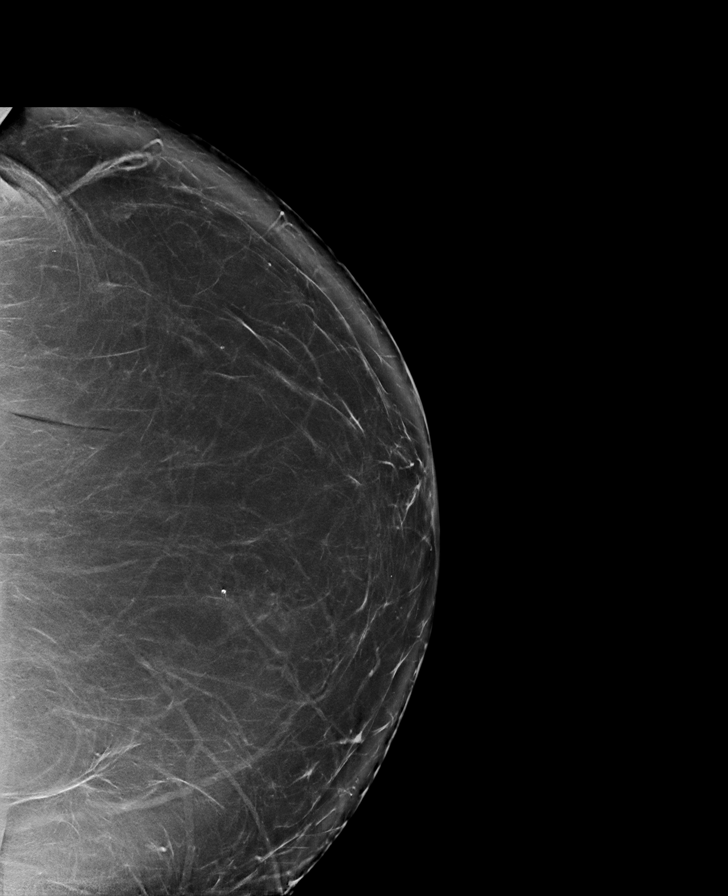

[L CC]
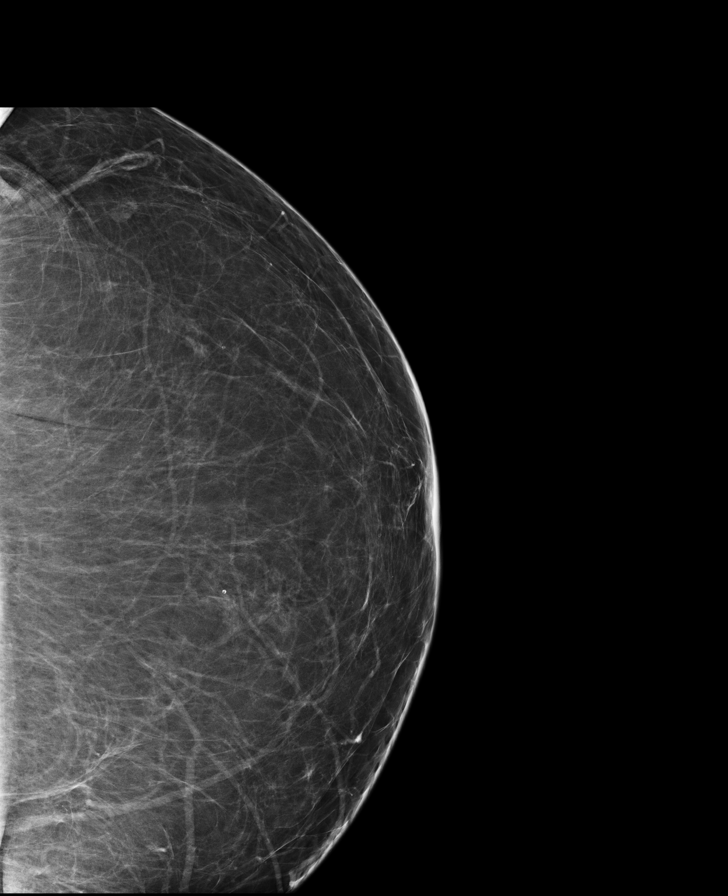

[L MLO]
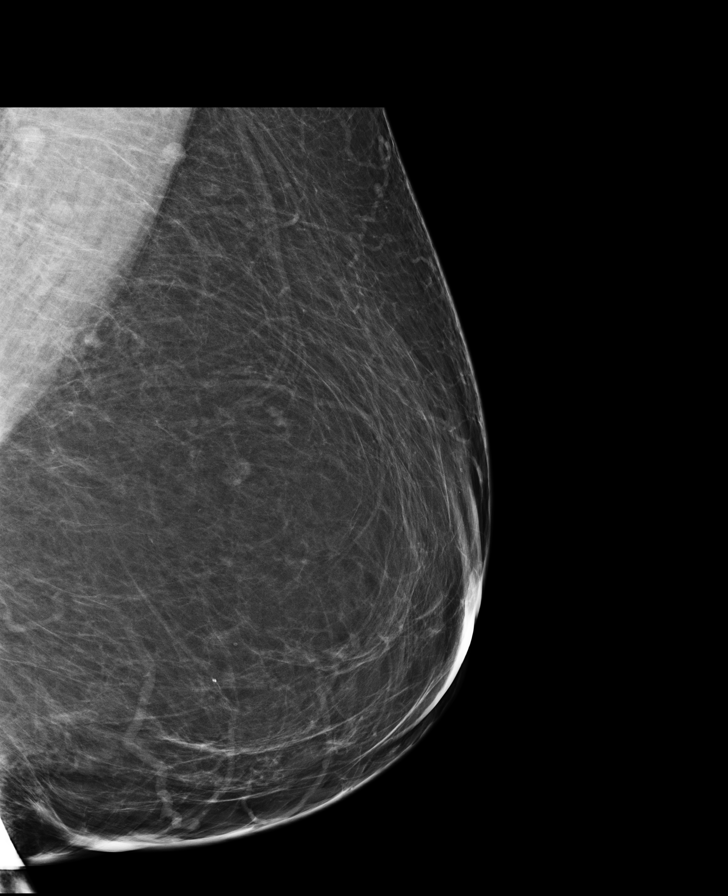

[R MLO synth-2D]
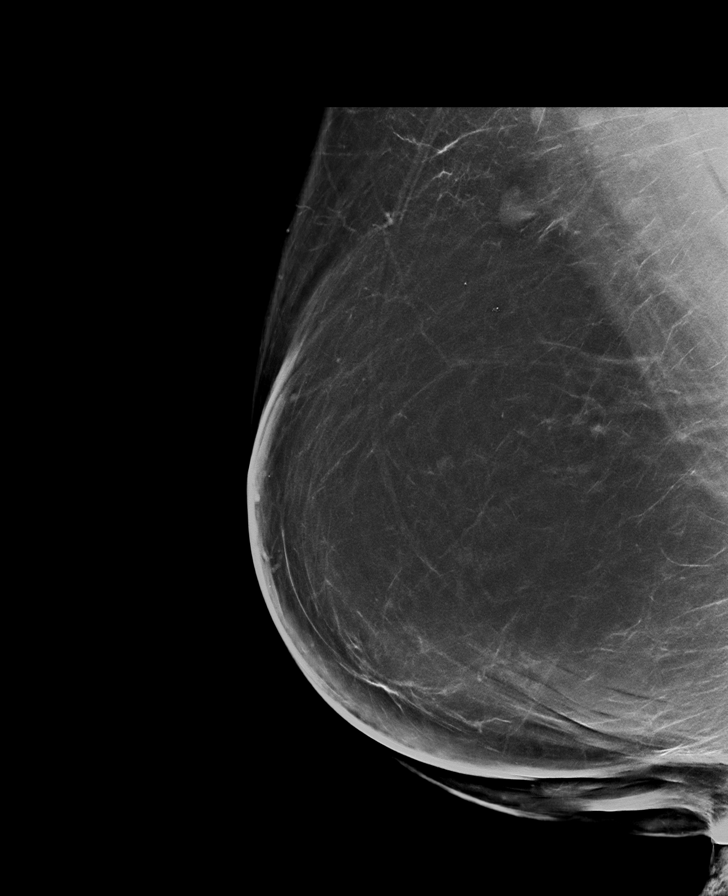

[L MLO synth-2D]
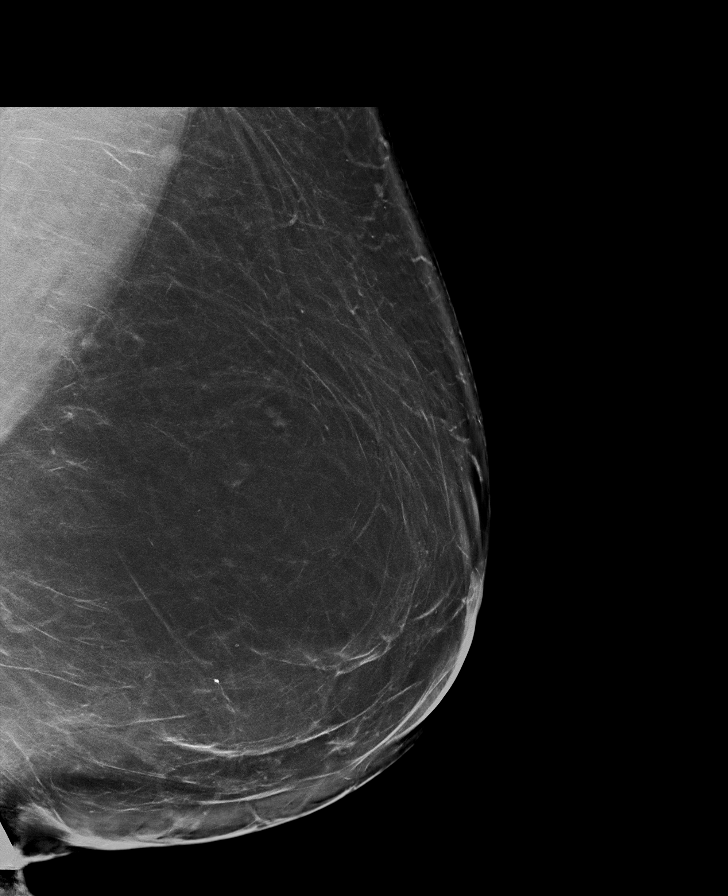

[R CC]
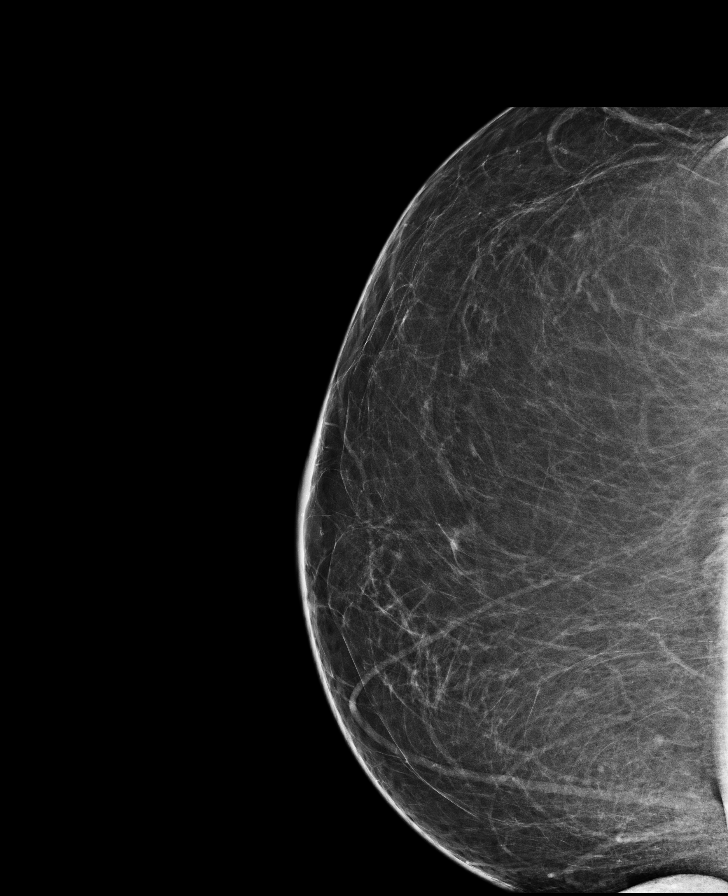

[R CC synth-2D]
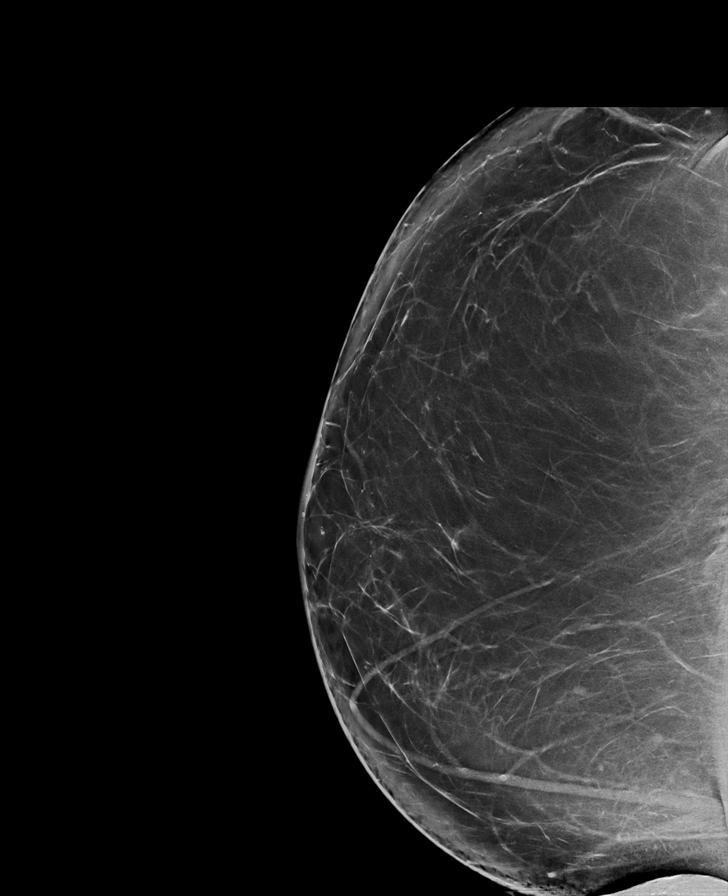

[8 of 29 positions shown; findings below may reference images not displayed]

FINDINGS: There are no findings suspicious for malignancy. Images were
processed with CAD.
IMPRESSION: No mammographic evidence of malignancy. A result letter of this
screening mammogram will be mailed directly to the patient.

RECOMMENDATION:
Screening mammogram in one year. (Code:[ZP])

BI-RADS CATEGORY  1: Negative.

## 2016-06-01 ENCOUNTER — Ambulatory Visit: Payer: BLUE CROSS/BLUE SHIELD | Admitting: Obstetrics and Gynecology

## 2016-06-01 ENCOUNTER — Encounter: Payer: Self-pay | Admitting: Obstetrics and Gynecology

## 2016-08-28 ENCOUNTER — Encounter: Payer: Self-pay | Admitting: Internal Medicine

## 2016-08-28 ENCOUNTER — Ambulatory Visit (INDEPENDENT_AMBULATORY_CARE_PROVIDER_SITE_OTHER): Payer: BLUE CROSS/BLUE SHIELD | Admitting: Internal Medicine

## 2016-08-28 DIAGNOSIS — B9789 Other viral agents as the cause of diseases classified elsewhere: Secondary | ICD-10-CM | POA: Diagnosis not present

## 2016-08-28 DIAGNOSIS — J069 Acute upper respiratory infection, unspecified: Secondary | ICD-10-CM

## 2016-08-28 MED ORDER — PREDNISONE 20 MG PO TABS
40.0000 mg | ORAL_TABLET | Freq: Every day | ORAL | 0 refills | Status: DC
Start: 2016-08-28 — End: 2016-12-24

## 2016-08-28 MED ORDER — CYCLOBENZAPRINE HCL 5 MG PO TABS: 5.0000 mg | ORAL_TABLET | Freq: Three times a day (TID) | ORAL | 1 refills | Status: DC | PRN

## 2016-08-28 MED ORDER — ALBUTEROL SULFATE HFA 108 (90 BASE) MCG/ACT IN AERS: 2.0000 | INHALATION_SPRAY | Freq: Four times a day (QID) | RESPIRATORY_TRACT | 1 refills | Status: DC | PRN

## 2016-08-28 NOTE — Progress Notes (Signed)
Pre visit review using our clinic review tool, if applicable. No additional management support is needed unless otherwise documented below in the visit note. 

## 2016-08-28 NOTE — Patient Instructions (Signed)
We have sent in a new albuterol inhaler.   We have sent in prednisone to dry this up. Take 2 pills a day for 5 days.   We have sent in the muscle relaxer called flexeril which you can use as needed up to 1 pill 3 times per day.

## 2016-08-28 NOTE — Progress Notes (Signed)
   Subjective:    Patient ID: Danielle Shaw, female    DOB: Dec 05, 1970, 46 y.o.   MRN: [id number]  HPI The patient is a 46 YO female coming in for cold symptoms for about 1 week. She is congested and coughing. She is having loss of voice but no sore throat. No fevers or chills. She is having some muscle pain from coughing so much. Overall the symptoms are worsening with the last week. She has been using the albuterol inhaler more often and is almost out.   Review of Systems  Constitutional: Positive for activity change and fatigue. Negative for appetite change, chills, fever and unexpected weight change.  HENT: Positive for congestion, postnasal drip, rhinorrhea and voice change. Negative for ear discharge, ear pain, sinus pain, sinus pressure, sore throat and trouble swallowing.   Respiratory: Positive for cough and chest tightness. Negative for shortness of breath and wheezing.   Cardiovascular: Negative.   Gastrointestinal: Negative.   Musculoskeletal: Positive for myalgias. Negative for arthralgias and back pain.      Objective:   Physical Exam  Constitutional: She is oriented to person, place, and time. She appears well-developed and well-nourished.  HENT:  Head: Normocephalic and atraumatic.  Right Ear: External ear normal.  Left Ear: External ear normal.  Oropharynx with redness and clear drainage.   Eyes: EOM are normal.  Neck: Normal range of motion.  Cardiovascular: Normal rate and regular rhythm.   Pulmonary/Chest: Effort normal. No respiratory distress. She has no wheezes.  Scattered rhonchi which does not clear with cough.   Abdominal: Soft.  Neurological: She is alert and oriented to person, place, and time.  Skin: Skin is warm and dry.   Vitals:   08/28/16 1610  BP: (!) 160/90  Pulse: 79  Temp: 98 F (36.7 C)  TempSrc: Oral  SpO2: 99%  Weight: (!) 338 lb 12 oz (153.7 kg)  Height: 5' 4.5" (1.638 m)      Assessment & Plan:

## 2016-08-30 NOTE — Assessment & Plan Note (Signed)
Refill her albuterol inhaler, prednisone for some rhonchi on exam, flexeril for the muscle pain from coughing.

## 2016-12-24 ENCOUNTER — Ambulatory Visit (INDEPENDENT_AMBULATORY_CARE_PROVIDER_SITE_OTHER): Payer: BLUE CROSS/BLUE SHIELD | Admitting: Internal Medicine

## 2016-12-24 ENCOUNTER — Other Ambulatory Visit (INDEPENDENT_AMBULATORY_CARE_PROVIDER_SITE_OTHER): Payer: BLUE CROSS/BLUE SHIELD

## 2016-12-24 ENCOUNTER — Encounter: Payer: Self-pay | Admitting: Internal Medicine

## 2016-12-24 ENCOUNTER — Ambulatory Visit (INDEPENDENT_AMBULATORY_CARE_PROVIDER_SITE_OTHER)
Admission: RE | Admit: 2016-12-24 | Discharge: 2016-12-24 | Disposition: A | Discharge status: Home / Self Care | Payer: BLUE CROSS/BLUE SHIELD | Source: Ambulatory Visit | Attending: Internal Medicine | Admitting: Internal Medicine

## 2016-12-24 VITALS — BP 150/90 | HR 81 | Temp 98.1°F | Resp 14 | Ht 64.5 in | Wt 336.0 lb

## 2016-12-24 DIAGNOSIS — M2550 Pain in unspecified joint: Secondary | ICD-10-CM

## 2016-12-24 DIAGNOSIS — M25561 Pain in right knee: Secondary | ICD-10-CM | POA: Diagnosis not present

## 2016-12-24 LAB — CBC
HEMATOCRIT: 40.3 % (ref 36.0–46.0)
Hemoglobin: 13.5 g/dL (ref 12.0–15.0)
MCHC: 33.6 g/dL (ref 30.0–36.0)
MCV: 86.8 fl (ref 78.0–100.0)
Platelets: 325 10*3/uL (ref 150.0–400.0)
RBC: 4.64 Mil/uL (ref 3.87–5.11)
RDW: 14.2 % (ref 11.5–15.5)
WBC: 4.2 10*3/uL (ref 4.0–10.5)

## 2016-12-24 LAB — COMPREHENSIVE METABOLIC PANEL
ALT: 10 U/L (ref 0–35)
AST: 13 U/L (ref 0–37)
Albumin: 4.4 g/dL (ref 3.5–5.2)
Alkaline Phosphatase: 106 U/L (ref 39–117)
BILIRUBIN TOTAL: 0.5 mg/dL (ref 0.2–1.2)
BUN: 9 mg/dL (ref 6–23)
CHLORIDE: 102 meq/L (ref 96–112)
CO2: 31 mEq/L (ref 19–32)
CREATININE: 0.76 mg/dL (ref 0.40–1.20)
Calcium: 9.6 mg/dL (ref 8.4–10.5)
GFR: 106.21 mL/min (ref >60.00)
GLUCOSE: 99 mg/dL (ref 70–99)
Potassium: 3.2 mEq/L — ABNORMAL LOW (ref 3.5–5.1)
SODIUM: 138 meq/L (ref 135–145)
Total Protein: 8.2 g/dL (ref 6.0–8.3)

## 2016-12-24 LAB — LIPID PANEL
CHOLESTEROL: 291 mg/dL — AB (ref 0–200)
HDL: 40.5 mg/dL (ref >39.00)
LDL Cholesterol: 223 mg/dL — ABNORMAL HIGH (ref 0–99)
NONHDL: 250.53
Total CHOL/HDL Ratio: 7
Triglycerides: 137 mg/dL (ref 0.0–149.0)
VLDL: 27.4 mg/dL (ref 0.0–40.0)

## 2016-12-24 LAB — HEMOGLOBIN A1C: Hgb A1c MFr Bld: 6.4 % (ref 4.6–6.5)

## 2016-12-24 IMAGING — DX DG KNEE COMPLETE 4+V*R*
4 series · 4 of 4 positions shown · non-contrast
Comparison: [DATE]

CLINICAL DATA: Fell 5 days ago, generalized pain with flexion
since, history arthritis

EXAM:
RIGHT KNEE - COMPLETE 4+ VIEW

[knee ap]
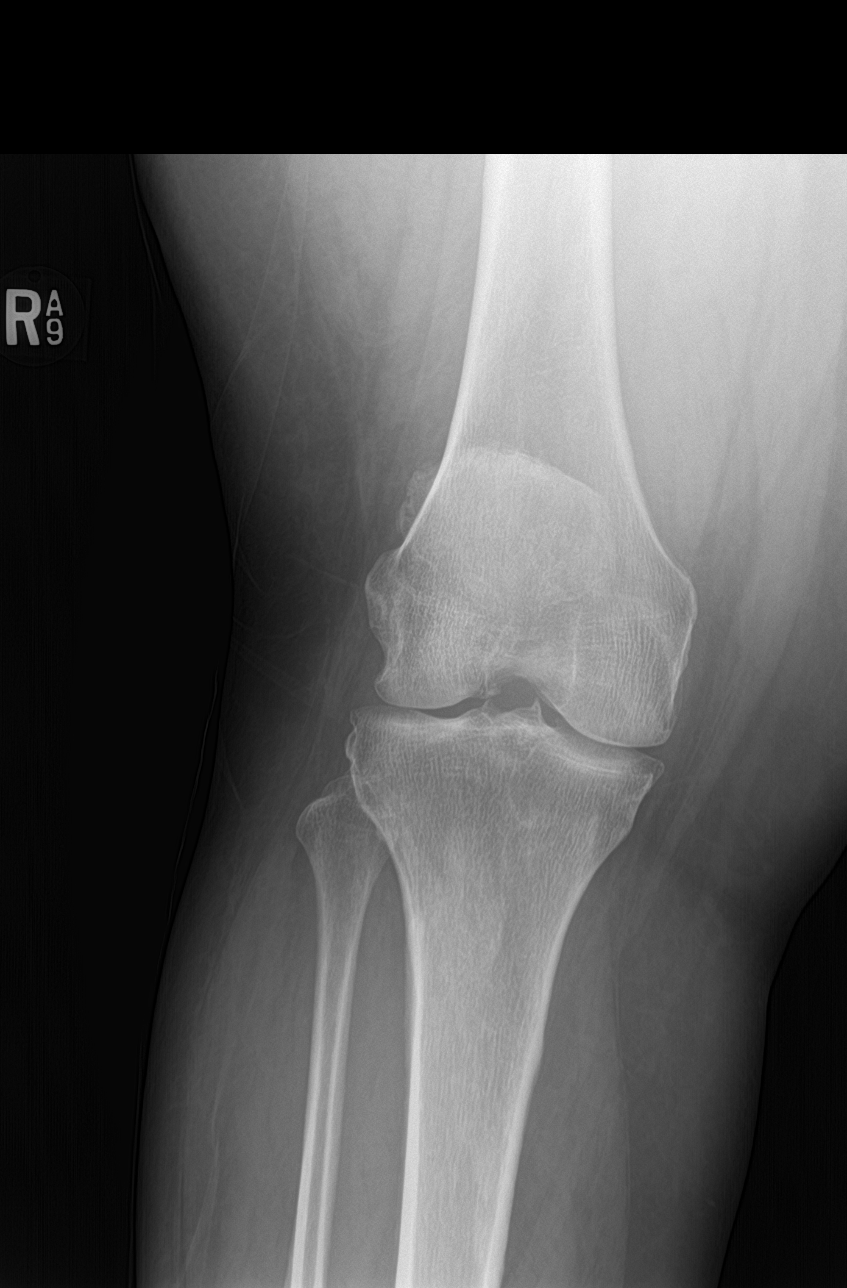

[knee tunnel]
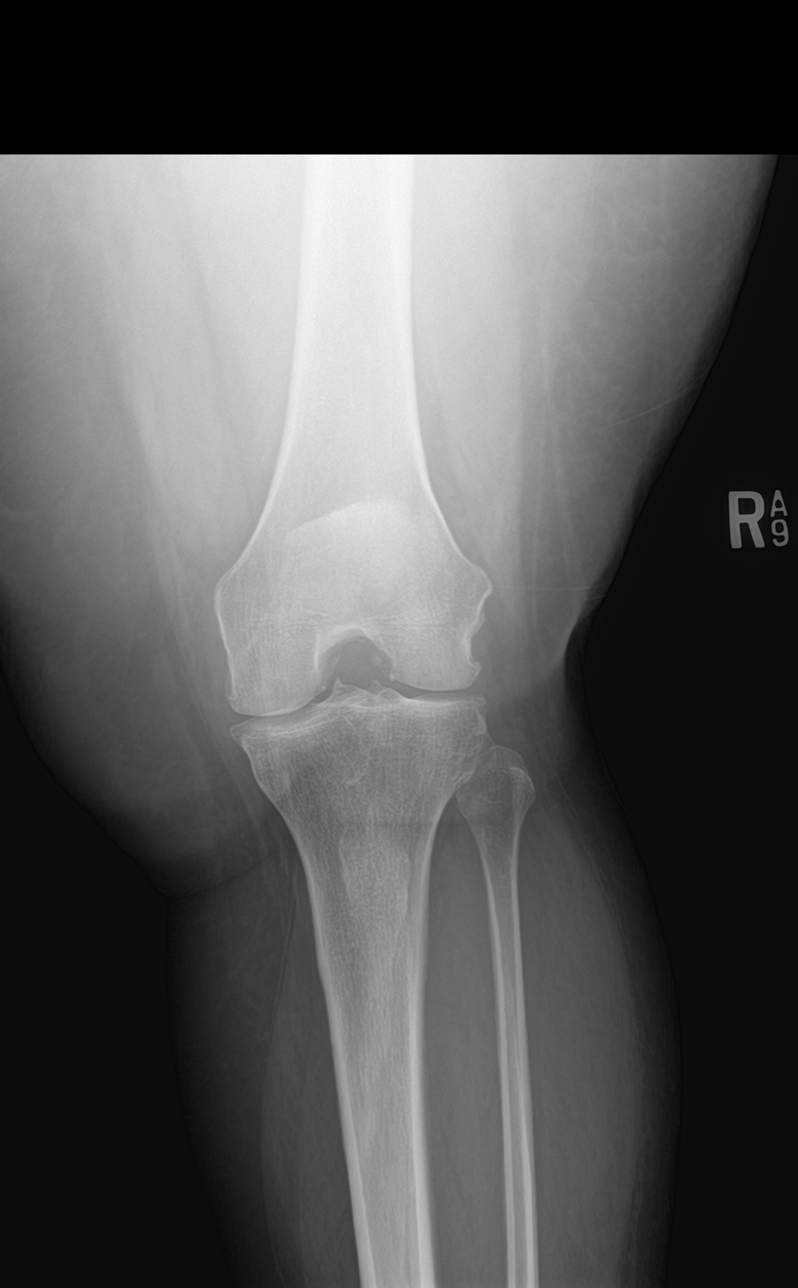

[knee lat]
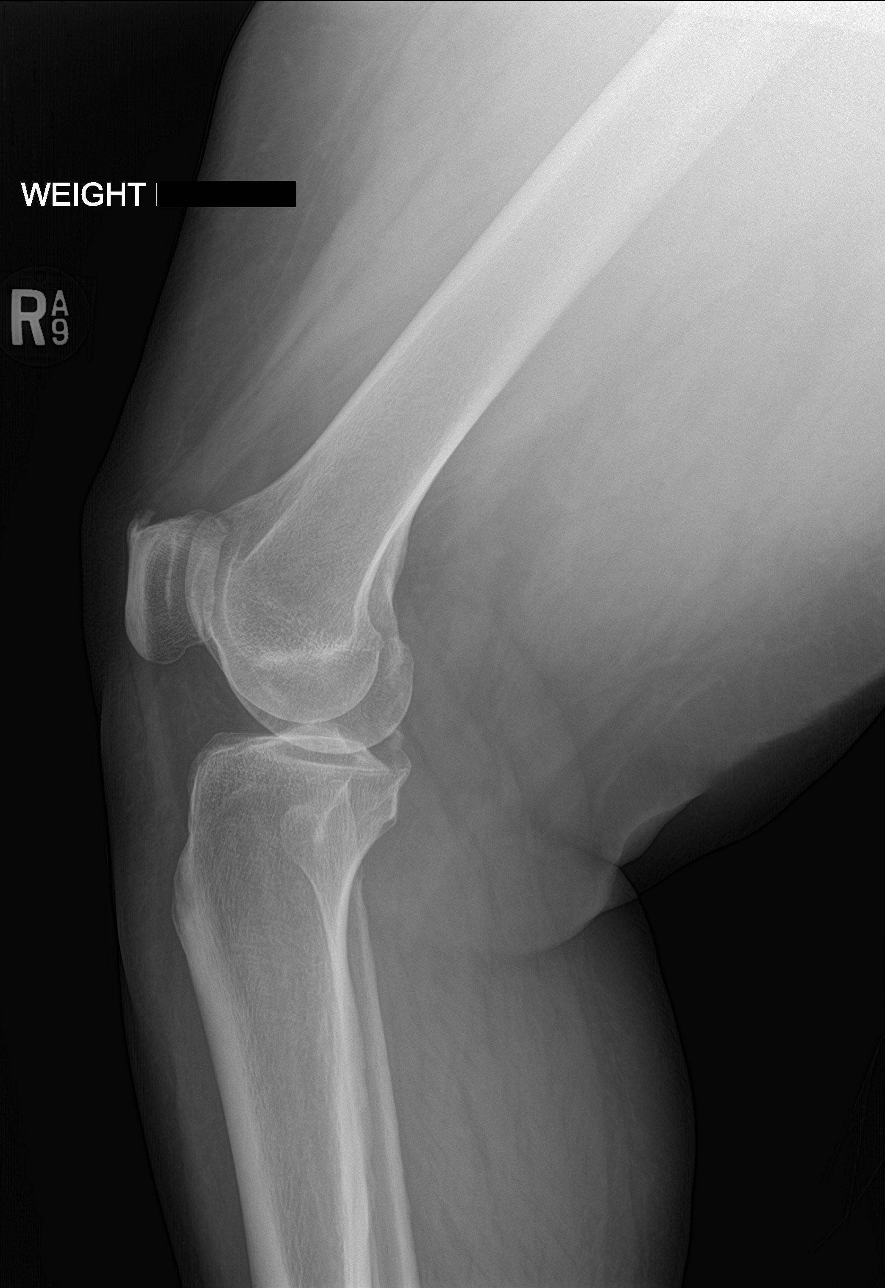

[sunrise]
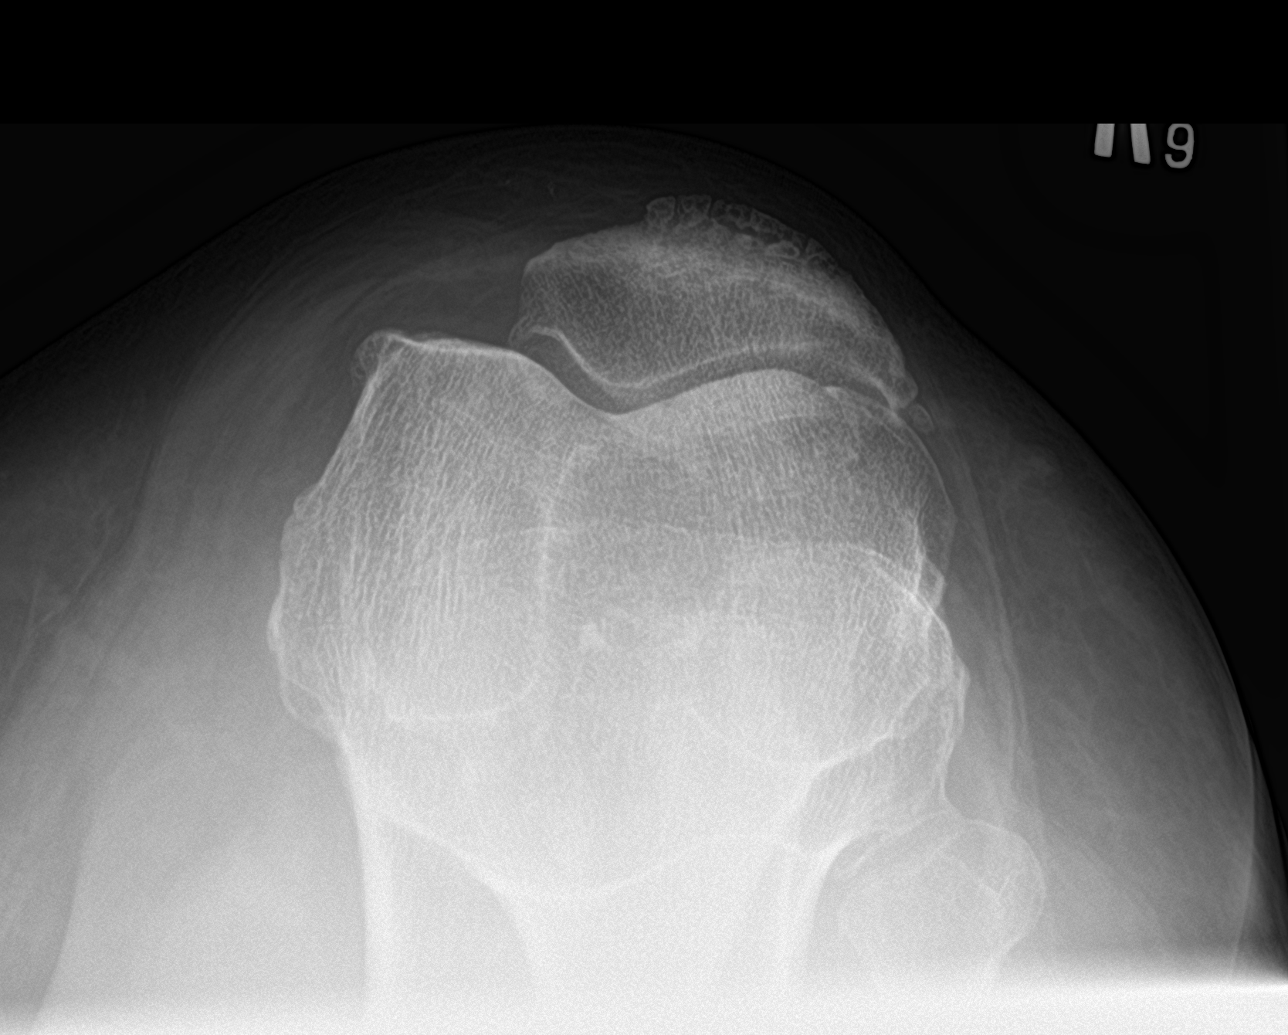

[4 of 4 positions shown; findings below may reference images not displayed]

FINDINGS: Osseous demineralization.

Scattered joint space narrowing.

No acute fracture, dislocation, or bone destruction.

No knee joint effusion.

Small patellar spur at quadriceps tendon insertion.
IMPRESSION: Osseous demineralization with mild degenerative changes RIGHT knee.

No acute abnormalities pre

## 2016-12-24 MED ORDER — CYCLOBENZAPRINE HCL 5 MG PO TABS: 5.0000 mg | ORAL_TABLET | Freq: Three times a day (TID) | ORAL | 1 refills | Status: AC | PRN

## 2016-12-24 MED ORDER — PREDNISONE 20 MG PO TABS: 40.0000 mg | ORAL_TABLET | Freq: Every day | ORAL | 0 refills | Status: DC

## 2016-12-24 NOTE — Patient Instructions (Signed)
We have sent in prednisone to take for the shoulder pain. Take 2 pills daily for 5 days. We have also sent in the muscle relaxer flexeril to take 1 pill up to 3 times per day to help with the pain until the prednisone works.  We are checking the x-ray of the knee and the labs today.

## 2016-12-24 NOTE — Progress Notes (Signed)
   Subjective:    Patient ID: Danielle Shaw, female    DOB: [birth year], 46 y.o.   MRN: [id number]  HPI The patient is a 45 YO female coming in for arthritis pains in her left shoulder and right knee. The right knee pain has been ongoing for several years and is overall worsening with time. She gets stiff with sitting or standing for a long time. She is taking advil and tylenol with relief but concerned that she is so young. She has a friend with rheumatoid arthritis and wants to make sure she does not have this. She denies excessive morning stiffness.  The left shoulder has been hurting for the last week or so. She denies excessive use or injury. Taking advil and tylenol without much relief. Some loss of ROM due to pain. Denies rash on the skin or swelling in the joint. She has tried heat with mild relief. No fevers or chills.   Review of Systems  Constitutional: Positive for activity change. Negative for appetite change, fatigue, fever and unexpected weight change.  Respiratory: Negative.   Cardiovascular: Negative.   Gastrointestinal: Negative.   Musculoskeletal: Positive for arthralgias and myalgias. Negative for back pain, gait problem and joint swelling.  Skin: Negative.   Neurological: Negative.       Objective:   Physical Exam  Constitutional: She appears well-developed and well-nourished.  HENT:  Head: Normocephalic and atraumatic.  Eyes: EOM are normal.  Neck: Normal range of motion.  Cardiovascular: Normal rate and regular rhythm.   Pulmonary/Chest: Effort normal. No respiratory distress. She has no wheezes. She has no rales.  Abdominal: Soft. She exhibits no distension. There is no tenderness.  Musculoskeletal: She exhibits tenderness.  Pain in the left shoulder with some mild loss of ROM due to pain on active, passive with similar ROM to active.   Skin: Skin is warm and dry.   Vitals:   12/24/16 1118  BP: (!) 150/90  Pulse: 81  Resp: 14  Temp: 98.1 F (36.7 C)    TempSrc: Oral  SpO2: 98%  Weight: (!) 336 lb (152.4 kg)  Height: 5' 4.5" (1.638 m)      Assessment & Plan:

## 2016-12-25 LAB — RHEUMATOID FACTOR

## 2016-12-25 LAB — ANTI-NUCLEAR AB-TITER (ANA TITER)

## 2016-12-25 LAB — ANA: ANA: POSITIVE — AB

## 2016-12-26 DIAGNOSIS — M2550 Pain in unspecified joint: Secondary | ICD-10-CM | POA: Insufficient documentation

## 2016-12-26 NOTE — Assessment & Plan Note (Signed)
Checking x-ray knee for progression of arthritis and labs for autoimmune cause of the pains. Likely cause is her morbid obesity causing osteoarthritis which was discussed with her. Rx for prednisone burst for her shoulder. Continue to use tylenol and advil at home for pain.

## 2016-12-30 ENCOUNTER — Ambulatory Visit (INDEPENDENT_AMBULATORY_CARE_PROVIDER_SITE_OTHER): Payer: BLUE CROSS/BLUE SHIELD | Admitting: Internal Medicine

## 2016-12-30 ENCOUNTER — Encounter: Payer: Self-pay | Admitting: Internal Medicine

## 2016-12-30 DIAGNOSIS — R062 Wheezing: Secondary | ICD-10-CM | POA: Diagnosis not present

## 2016-12-30 MED ORDER — HYDROCODONE-HOMATROPINE 5-1.5 MG/5ML PO SYRP: 5.0000 mL | ORAL_SOLUTION | Freq: Three times a day (TID) | ORAL | 0 refills | Status: DC | PRN

## 2016-12-30 MED ORDER — AZITHROMYCIN 250 MG PO TABS: ORAL_TABLET | ORAL | 0 refills | Status: DC

## 2016-12-30 NOTE — Patient Instructions (Signed)
We have sent in the antibiotic called azithromycin or the z-pack. Take 2 pills today, then starting tomorrow take 1 pill daily.   We have also given you the cough medicine to use as needed.   If you are not getting better call us back.

## 2017-01-01 ENCOUNTER — Other Ambulatory Visit: Payer: Self-pay | Admitting: Internal Medicine

## 2017-01-01 MED ORDER — ROSUVASTATIN CALCIUM 20 MG PO TABS: 20.0000 mg | ORAL_TABLET | Freq: Every day | ORAL | 3 refills | Status: DC

## 2017-01-01 NOTE — Progress Notes (Signed)
   Subjective:    Patient ID: Danielle Shaw, female    DOB: 03/14/1971, 46 y.o.   MRN: [id number]  HPI The patient is a 46 YO female coming in for cough and hx of RAD. Started about 4 days ago, overall worsening. Was on prednisone when it started for her joints which did not help. She denies sick contacts. She is taking her xyzal for allergies some of the time. She is coughing daytime and night time. She does have some mild wheezing and SOB. Using albuterol inhaler more often lately. No fevers or chills. Minimal sinus drainage symptoms.   Review of Systems  Constitutional: Positive for activity change and fatigue. Negative for appetite change, chills, fever and unexpected weight change.  HENT: Positive for congestion and rhinorrhea. Negative for dental problem, drooling, ear pain, facial swelling, postnasal drip, sinus pain and sinus pressure.   Eyes: Negative.   Respiratory: Positive for cough, shortness of breath and wheezing. Negative for chest tightness.   Cardiovascular: Negative.   Gastrointestinal: Negative.   Skin: Negative.       Objective:   Physical Exam  Constitutional: She is oriented to person, place, and time. She appears well-developed and well-nourished.  HENT:  Head: Normocephalic and atraumatic.  Oropharynx with mild clear drainage, no nasal crusting.   Cardiovascular: Normal rate and regular rhythm.   Pulmonary/Chest: Effort normal. No respiratory distress. She has wheezes. She has no rales.  Mild wheezing and rhonchi in the left lung which partially clears with coughing.   Abdominal: Soft.  Neurological: She is alert and oriented to person, place, and time. Coordination normal.  Skin: Skin is warm and dry.   Vitals:   12/30/16 1551  BP: (!) 156/100  Pulse: 94  Resp: 16  Temp: 98.1 F (36.7 C)  TempSrc: Oral  SpO2: 96%  Weight: (!) 328 lb (148.8 kg)  Height: 5' 4.5" (1.638 m)      Assessment & Plan:

## 2017-01-01 NOTE — Assessment & Plan Note (Signed)
Rx for azithromycin and hycodan. Given recent steroid course will avoid for now and she will call if worsening or lack of improvement and will repeat course.

## 2017-02-09 ENCOUNTER — Encounter: Payer: Self-pay | Admitting: Obstetrics and Gynecology

## 2017-03-19 ENCOUNTER — Encounter: Payer: Self-pay | Admitting: Family Medicine

## 2017-03-19 ENCOUNTER — Ambulatory Visit (INDEPENDENT_AMBULATORY_CARE_PROVIDER_SITE_OTHER): Payer: BLUE CROSS/BLUE SHIELD | Admitting: Family Medicine

## 2017-03-19 DIAGNOSIS — M25561 Pain in right knee: Secondary | ICD-10-CM | POA: Diagnosis not present

## 2017-03-19 MED ORDER — MELOXICAM 15 MG PO TABS: 15.0000 mg | ORAL_TABLET | Freq: Every day | ORAL | 1 refills | Status: DC | PRN

## 2017-03-19 NOTE — Assessment & Plan Note (Addendum)
Likely contributing to her underlying knee pain. Her medial joint line narrowing will get worse. - Counseled on weight loss today - Could consider referral to bariatric surgery versus obesity clinic

## 2017-03-19 NOTE — Progress Notes (Signed)
Danielle Shaw - 46 y.o. female MRN [id number]  Date of birth: 1970-07-28  SUBJECTIVE:  Including CC & ROS.  Chief Complaint  Patient presents with  . Knee Pain    X3 days constant excruciating pain. feels better if leg is rested in an extended position. hurts to walk. patients states a burning sensation on the inner knee and above the knee a pin prick sensation and a pulling sensation from upper thight down to calf   Mrs. Danielle Shaw is a 46 year old female presenting with right knee pain. She reports the pain is anterior medial in nature. This is acute on chronic pain. She denies any specific injury. She reports standing at work for long periods of time. She reports a popping. She has not taken any medications. The pain is localized. She is ambulating with a crutch to help with assistance. She reports the pain is occurring in the posterior aspect as well. Reports the pain is significant in nature. She describes this as sharp and throbbing.   Patient with knee x-rays from 5/91/18 of the right knee. I have independently reviewed these x-rays. She has some lateral shift of the patella. She has some mild medial joint space narrowing.  Review of Systems  Cardiovascular: Negative for leg swelling.  Musculoskeletal: Positive for gait problem and joint swelling. Negative for myalgias.  Skin: Negative for color change.  Neurological: Negative for weakness and numbness.   otherwise negative  HISTORY: Past Medical, Surgical, Social, and Family History Reviewed & Updated per EMR.   Pertinent Historical Findings include:  Past Medical History:  Diagnosis Date  . Arrhythmia   . GERD (gastroesophageal reflux disease)   . Hyperlipidemia   . Hypertension   . IUD 2004  . IUD 2009  . Migraine     Past Surgical History:  Procedure Laterality Date  . BREAST BIOPSY Right 2011  . BREAST SURGERY  2000   reduction    Allergies  Allergen Reactions  . Latex   . Lisinopril   . Metoprolol     Family  History  Problem Relation Age of Onset  . Heart disease Mother   . Diabetes Father   . Heart disease Father   . Heart disease Maternal Grandmother      Social History   Social History  . Marital status: Single    Spouse name: N/A  . Number of children: N/A  . Years of education: N/A   Occupational History  . Not on file.   Social History Main Topics  . Smoking status: Never Smoker  . Smokeless tobacco: Never Used  . Alcohol use No  . Drug use: No  . Sexual activity: Yes    Partners: Male   Other Topics Concern  . Not on file   Social History Narrative  . No narrative on file     PHYSICAL EXAM:  VS: BP (!) 142/90 (BP Location: Left Arm, Patient Position: Sitting, Cuff Size: Large)   Pulse 69   Temp 98.5 F (36.9 C) (Oral)   Ht 5' 4.5" (1.638 m)   Wt (!) 321 lb (145.6 kg)   SpO2 98%   BMI 54.25 kg/m  Physical Exam Gen: NAD, alert, cooperative with exam,  ENT: normal lips, normal nasal mucosa,  Eye: normal EOM, normal conjunctiva and lids CV:  no edema, +2 pedal pulses   Resp: no accessory muscle use, non-labored,  Skin: no rashes, no areas of induration  Neuro: normal tone, normal sensation to touch Psych:  normal  insight, alert and oriented MSK:  Right knee: Exam limited based on patient's pain. Tenderness to palpation of the medial joint line. Some tenderness to palpation of the medial femoral condyle. Tenderness to palpation of the quadriceps tendon. No tenderness to palpation of the patellar tendon. Limited flexion. Normal extension. Pain with Murray's testing. No stability with valgus or varus stress testing. Neurovascularly intact.  Limited ultrasound: Right knee:  Mild to moderate effusion with suprapatellar pouch. Arthritic changes with in the medial joint line without pouching of the medial meniscus  Summary: Findings consistent with degenerative meniscal changes and arthritis.  Ultrasound and interpretation by Clearance Coots, MD              ASSESSMENT & PLAN:   Right knee pain Think to be related to chronic degenerative meniscal tear versus some underlying arthritis. She likely has component of patellofemoral syndrome as well. - Mobic today - If no improvement consider steroid injection - Provided with home exercises  Morbid obesity ([place]) Likely contributing to her underlying knee pain. Her medial joint line narrowing will get worse. - Counseled on weight loss today - Could consider referral to bariatric surgery versus obesity clinic

## 2017-03-19 NOTE — Patient Instructions (Addendum)
Thank you for coming in,   Please try obtaining an over-the-counter orthotic at [facility]. Please take the mobic for 10 days straight and then as needed after that. Please try to ice the knee. Once the pain has subsided then please try these different exercises to help with the knee. If there is no improvement then you can follow-up for an injection.    Please feel free to call with any questions or concerns at any time, at [phone]. --Dr. Raeford Razor

## 2017-03-19 NOTE — Assessment & Plan Note (Signed)
Think to be related to chronic degenerative meniscal tear versus some underlying arthritis. She likely has component of patellofemoral syndrome as well. - Mobic today - If no improvement consider steroid injection - Provided with home exercises

## 2017-05-17 DIAGNOSIS — B309 Viral conjunctivitis, unspecified: Secondary | ICD-10-CM | POA: Diagnosis not present

## 2017-05-27 DIAGNOSIS — B309 Viral conjunctivitis, unspecified: Secondary | ICD-10-CM | POA: Diagnosis not present

## 2017-10-11 ENCOUNTER — Ambulatory Visit: Payer: BLUE CROSS/BLUE SHIELD | Admitting: Internal Medicine

## 2017-10-11 ENCOUNTER — Encounter: Payer: Self-pay | Admitting: Internal Medicine

## 2017-10-11 VITALS — BP 152/110 | HR 88 | Temp 98.6°F | Ht 64.5 in | Wt 338.0 lb

## 2017-10-11 DIAGNOSIS — I10 Essential (primary) hypertension: Secondary | ICD-10-CM | POA: Diagnosis not present

## 2017-10-11 DIAGNOSIS — J40 Bronchitis, not specified as acute or chronic: Secondary | ICD-10-CM | POA: Diagnosis not present

## 2017-10-11 DIAGNOSIS — R0683 Snoring: Secondary | ICD-10-CM | POA: Diagnosis not present

## 2017-10-11 MED ORDER — DOXYCYCLINE HYCLATE 100 MG PO TABS
100.0000 mg | ORAL_TABLET | Freq: Two times a day (BID) | ORAL | 0 refills | Status: DC
Start: 2017-10-11 — End: 2017-12-08

## 2017-10-11 MED ORDER — LEVOCETIRIZINE DIHYDROCHLORIDE 5 MG PO TABS: 5.0000 mg | ORAL_TABLET | Freq: Every evening | ORAL | 6 refills | Status: DC

## 2017-10-11 MED ORDER — LOSARTAN POTASSIUM-HCTZ 50-12.5 MG PO TABS: 1.0000 | ORAL_TABLET | Freq: Every day | ORAL | 0 refills | Status: DC

## 2017-10-11 MED ORDER — HYDROCODONE-HOMATROPINE 5-1.5 MG/5ML PO SYRP: 5.0000 mL | ORAL_SOLUTION | Freq: Every evening | ORAL | 0 refills | Status: DC | PRN

## 2017-10-11 MED ORDER — MELOXICAM 15 MG PO TABS: 15.0000 mg | ORAL_TABLET | Freq: Every day | ORAL | 1 refills | Status: DC | PRN

## 2017-10-11 NOTE — Progress Notes (Signed)
   Subjective:    Patient ID: Danielle Shaw, female    DOB: [birth year], 47 y.o.   MRN: [id number]  HPI The patient is a 47 YO female coming in for several problems including snoring (previously asked about this problem but did not have the money for sleep study, her partner states she is snoring a lot and sometimes she wakes up not being able to breathe while sleeping, BMI >50) and blood pressure (she is out of her losartan/hctz for months, she is not sure why she has not gotten refills, she does have headaches, denies chest pains), and cold symptoms (going on 2-3 weeks with cough, SOB, congestion, nose drainage, some sinus pressure, headaches, some chills initially but not in the last 3 days, cough and SOB is getting worse). She is also having some side and back pain since she has been coughing so much.   Review of Systems  Constitutional: Positive for activity change, appetite change and chills. Negative for fatigue, fever and unexpected weight change.  HENT: Positive for congestion, postnasal drip, rhinorrhea, sinus pressure and sore throat. Negative for ear discharge, ear pain, sinus pain, sneezing, tinnitus, trouble swallowing and voice change.   Eyes: Negative.   Respiratory: Positive for cough and shortness of breath. Negative for chest tightness and wheezing.   Cardiovascular: Negative.   Gastrointestinal: Negative.   Musculoskeletal: Positive for arthralgias, back pain and myalgias.  Skin: Negative.   Neurological: Positive for headaches.  Psychiatric/Behavioral: Negative.       Objective:   Physical Exam  Constitutional: She is oriented to person, place, and time. She appears well-developed and well-nourished.  HENT:  Head: Normocephalic and atraumatic.  Oropharynx with redness and clear drainage, nose with swollen turbinates, TMs normal bilaterally  Eyes: EOM are normal.  Neck: Normal range of motion. No thyromegaly present.  Cardiovascular: Normal rate and regular rhythm.    Pulmonary/Chest: Effort normal and breath sounds normal. No respiratory distress. She has no wheezes. She has no rales.  Some scattered rhonchi which partially clear with cough  Abdominal: Soft. She exhibits no distension. There is no tenderness. There is no rebound.  Musculoskeletal: She exhibits tenderness.  Lymphadenopathy:    She has no cervical adenopathy.  Neurological: She is alert and oriented to person, place, and time. Coordination normal.  Skin: Skin is warm and dry.  Psychiatric: She has a normal mood and affect.   Vitals:   10/11/17 1439  BP: (!) 152/110  Pulse: 88  Temp: 98.6 F (37 C)  TempSrc: Oral  SpO2: 96%  Weight: (!) 338 lb (153.3 kg)  Height: 5' 4.5" (1.638 m)      Assessment & Plan:

## 2017-10-11 NOTE — Patient Instructions (Signed)
We have sent in the cough medicine to use if needed.   We have sent in xyzal to take 1 pill daily for the drainage.   We have sent in doxycycline to take for the lungs and sinuses. Take 1 pill twice a day for 1 week.   We will get the sleep test ordered.

## 2017-10-12 NOTE — Assessment & Plan Note (Signed)
Rx for xyzal and doxycycline given time course. Given rx for hycodan after consulting [place] narcotic database without any inappropriate fills. Has albuterol prn.

## 2017-10-12 NOTE — Assessment & Plan Note (Signed)
Referral to neurology for home sleep test for likely OSA and CPAP if appropriate.

## 2017-10-12 NOTE — Assessment & Plan Note (Signed)
In moderate flare today, she denies any cold medicine with ingredients that could elevate her pressure. Refill her losartan/hctz and bring back soon to recheck on meds.

## 2017-10-20 ENCOUNTER — Encounter: Payer: Self-pay | Admitting: Neurology

## 2017-10-20 ENCOUNTER — Ambulatory Visit: Payer: BLUE CROSS/BLUE SHIELD | Admitting: Neurology

## 2017-10-20 VITALS — BP 159/99 | HR 79 | Ht 64.0 in | Wt 339.0 lb

## 2017-10-20 DIAGNOSIS — Z6841 Body Mass Index (BMI) 40.0 and over, adult: Secondary | ICD-10-CM | POA: Diagnosis not present

## 2017-10-20 DIAGNOSIS — G4719 Other hypersomnia: Secondary | ICD-10-CM | POA: Diagnosis not present

## 2017-10-20 DIAGNOSIS — R0683 Snoring: Secondary | ICD-10-CM

## 2017-10-20 DIAGNOSIS — R4189 Other symptoms and signs involving cognitive functions and awareness: Secondary | ICD-10-CM | POA: Diagnosis not present

## 2017-10-20 DIAGNOSIS — G4726 Circadian rhythm sleep disorder, shift work type: Secondary | ICD-10-CM | POA: Diagnosis not present

## 2017-10-20 DIAGNOSIS — G4753 Recurrent isolated sleep paralysis: Secondary | ICD-10-CM

## 2017-10-20 MED ORDER — MODAFINIL 100 MG PO TABS: ORAL_TABLET | ORAL | 0 refills | Status: DC

## 2017-10-20 NOTE — Progress Notes (Signed)
SLEEP MEDICINE CLINIC   Provider:  Larey Seat, M D  Primary Care Physician:  Hoyt Koch, MD   Referring Provider: Hoyt Koch, *  Chief Complaint  Patient presents with  . New Patient (Initial Visit)    pt alone, rm 11. she has sinus infections reguarlily and a cough she has been dealing with. she has no problems going to sleep, she states on a avg night she sleeps 3-4 hours. pt has been told she snores in sleep. she has heart palpitations and irregular heartbeat    HPI:  Danielle Shaw is a 47 y.o. female patient , seen here in a referral from Dr. Sharlet Salina for a sleep medicine evaluation. [name] reports that she has been told that she snores and probably has been a snorer for many years but in the past did not have the insurance coverage for sleep study.  Her bed partner states that she is snoring a lot sometimes she wakes up choking or feeling that she cannot get the next breath.  She also has hypertension, headaches which are partially attributed to migraine, partially to sinusitis and may also be exacerbated under uncontrolled hypertension.  She did not have chest pains but she did have cold symptoms with coughing shortness of breath and congestion.  She also had some chills.  During the visit with Dr. Sharlet Salina on 19 March the patient had a blood pressure of 152/110 mmhg and had run out of her antihypertensives which is losartan and hydrochlorothiazide.  She has reached morbid obesity- BMI 58. No history of gestational diabetes. HTN, sinusuitis, unable to sleep longer than 4-5 hours.  Chief complaint according to patient : " I can go to sleep , but I sleep only 4-5 hours on a weekday"   Sleep habits are as follows: she return by 5 pm from work, cooks, takes a bath. Bedtime after a morning shift is around 10 Pm- she is often asleep within 5-10  Minutes.  She sleeps on her left side, on one pillow. The pillow helps acid reflux.  The bedroom is cool,  quiet and dark. Her boyfriend uses a CPAP. He watches TV and plays video game in the bedroom with  headphones-  On weekends - he used to eat in bed , too. That has stopped after she intervened.  She feels disturbed by the light. She has covered all clocks.  He has to wake at 4.30 , but she seems always awake at 3.30 and would like to gain that extra hour.  She wakes sometimes clammy, and has felt palpitations, wakes up in a sweat-. headaches recently in AM - did not stop with taking HTN medication.   Sleep medical history and family sleep history:  IUD for birth control, morbid obesity, HTN, irregular heart beats. No night terrors or sleep waling history. One brother is obese, snores, alcoholic,  ESRD on Hemo-dialysis.   Social history: Patient is a [name], academic at [employer]. She is on rotating shifts, one week a month at night, mainly in AM- 6 AM through 5 PM- on salary position , 5 days a week.  Single- raised her children alone. 2 daughters 24 and 48, and a 59 year old grandson.  She drinks seldomly- 1 drink per 3 month,no  tobacco use, caffein - reduced soda- had none in 2019, iced tea 8 ounces- 2  glasses , no coffee.   Review of Systems: Out of a complete 14 system review, the patient complains of  only the following symptoms, and all other reviewed systems are negative. 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    Epworth score  16/ 24   , Fatigue severity score 54/ 63   , depression score n/a    Social History   Socioeconomic History  . Marital status: Single    Spouse name: Not on file  . Number of children: Not on file  . Years of education: Not on file  . Highest education level: Not on file  Occupational History  . Not on file  Social Needs  . Financial resource strain: Not on file  . Food insecurity:    Worry: Not on file    Inability: Not on file  . Transportation needs:    Medical: Not on file    Non-medical: Not on file  Tobacco Use  . Smoking status:  Never Smoker  . Smokeless tobacco: Never Used  Substance and Sexual Activity  . Alcohol use: No    Alcohol/week: 0.0 oz  . Drug use: No  . Sexual activity: Yes    Partners: Male  Lifestyle  . Physical activity:    Days per week: Not on file    Minutes per session: Not on file  . Stress: Not on file  Relationships  . Social connections:    Talks on phone: Not on file    Gets together: Not on file    Attends religious service: Not on file    Active member of club or organization: Not on file    Attends meetings of clubs or organizations: Not on file    Relationship status: Not on file  . Intimate partner violence:    Fear of current or ex partner: Not on file    Emotionally abused: Not on file    Physically abused: Not on file    Forced sexual activity: Not on file  Other Topics Concern  . Not on file  Social History Narrative  . Not on file    Family History  Problem Relation Age of Onset  . Heart disease Mother   . Diabetes Father   . Heart disease Father   . Heart disease Maternal Grandmother   . Hodgkin's lymphoma Daughter     Past Medical History:  Diagnosis Date  . Arrhythmia   . GERD (gastroesophageal reflux disease)   . Hyperlipidemia   . Hypertension   . IUD 2004  . IUD 2009  . Migraine     Past Surgical History:  Procedure Laterality Date  . BREAST BIOPSY Right 2011  . BREAST SURGERY  2000   reduction  . CESAREAN SECTION      Current Outpatient Medications  Medication Sig Dispense Refill  . albuterol (PROVENTIL HFA;VENTOLIN HFA) 108 (90 Base) MCG/ACT inhaler Inhale 2 puffs into the lungs every 6 (six) hours as needed for wheezing or shortness of breath. 1 Inhaler 1  . aspirin 81 MG tablet Take 81 mg by mouth daily.     . cyclobenzaprine (FLEXERIL) 5 MG tablet Take 1 tablet (5 mg total) by mouth 3 (three) times daily as needed for muscle spasms. 30 tablet 1  . doxycycline (VIBRA-TABS) 100 MG tablet Take 1 tablet (100 mg total) by mouth 2 (two)  times daily. 14 tablet 0  . HYDROcodone-homatropine (HYCODAN) 5-1.5 MG/5ML syrup Take 5-10 mLs by mouth at bedtime as needed for cough. 50 mL 0  . ipratropium (ATROVENT) 0.03 % nasal spray Place 2 sprays into both nostrils every 12 (twelve) hours. [place]  mL 12  . levocetirizine (XYZAL) 5 MG tablet Take 1 tablet (5 mg total) by mouth every evening. 30 tablet 6  . losartan-hydrochlorothiazide (HYZAAR) 50-12.5 MG tablet Take 1 tablet by mouth daily. 90 tablet 0  . meloxicam (MOBIC) 15 MG tablet Take 1 tablet (15 mg total) by mouth daily as needed for pain. 30 tablet 1  . pantoprazole (PROTONIX) 40 MG tablet Take 1 tablet (40 mg total) by mouth daily. 30 tablet 3  . rosuvastatin (CRESTOR) 20 MG tablet Take 1 tablet (20 mg total) by mouth daily. 90 tablet 3  . simvastatin (ZOCOR) 40 MG tablet Take 1 tablet (40 mg total) by mouth at bedtime. 90 tablet 3   No current facility-administered medications for this visit.     Allergies as of 10/20/2017 - Review Complete 10/20/2017  Allergen Reaction Noted  . Latex    . Lisinopril  10/22/2011  . Metoprolol  10/22/2011    Vitals: BP (!) 159/99   Pulse 79   Ht 5\' 4"  (1.626 m)   Wt (!) 339 lb (153.8 kg)   BMI 58.19 kg/m  Last Weight:  Wt Readings from Last 1 Encounters:  10/20/17 (!) 339 lb (153.8 kg)   ACZ:YSAY mass index is 58.19 kg/m.     Last Height:   Ht Readings from Last 1 Encounters:  10/20/17 5\' 4"  (1.626 m)    Physical exam:  General: The patient is awake, alert and appears not in acute distress. The patient is well groomed. Head: Normocephalic, atraumatic. Neck is supple. Mallampati ,  neck circumference:18. 5 . Nasal airflow patent , TMJ unremarkable . Retrognathia is mild.  Cardiovascular:  Regular rate and rhythm , without  murmurs or carotid bruit, and without distended neck veins. Respiratory: wheezing  to auscultation. Skin:  Without evidence of edema, or rash Trunk: BMI is 58. The patient's posture is erect  Neurologic exam  : The patient is awake and alert, oriented to place and time.    Attention span & concentration ability appears normal.  Speech is fluent,  without dysarthria, dysphonia or aphasia.  Mood and affect are appropriate.  Cranial nerves: Pupils are equal and briskly reactive to light. Funduscopic exam without evidence of pallor or edema. Extraocular movements  in vertical and horizontal planes intact and without nystagmus. Visual fields by finger perimetry are intact. Hearing to finger rub intact.  Facial sensation intact to fine touch. Facial motor strength is symmetric and tongue and uvula move midline. Shoulder shrug was symmetrical.   Motor exam:   Normal tone, muscle bulk and symmetric strength in all extremities. Has good grip strength but left thenar eminence is smaller- carpal tunnel?  Sensory:  Fine touch, pinprick and vibration were tested in all extremities. Proprioception tested in the upper extremities was normal. She has sometimes the feeling o f her left arm being asleep, over the thighs. Her left arm is sometimes clumsy.   Coordination: Rapid alternating movements in the fingers/hands was normal.  Finger-to-nose maneuver normal without evidence of ataxia, dysmetria or tremor. Gait and station: Patient walks without assistive device Deep tendon reflexes: in the  upper and lower extremities are symmetric and intact.    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 Insomnia only partially due to sleep hygiene changes.   2) likely OSA-  Risk factors include a body mass index currently estimated at 58, a higher neck circumference 18.5 inches, and moderately narrow upper  airway Mallampati grade 3.  Witnessed snoring her bed partner.  She would be considered a high risk patient for obstructive sleep apnea.  3)  her body mass index determined also have a higher risk for hypertension, she may develop diabetes, obstructive sleep apnea risk is very strongly correlated with body mass index.  I will offer her a referral for a medical weight management physician.  I think she has good insight and what healthy nutrition should be and maybe she needs some help finding out where her metabolic rate is and how to exercise or trichomoniasis the body into burning more calories.   The patient was advised of the nature of the diagnosed disorder , the treatment options and the  risks for general health and wellness arising from not treating the condition.   I spent more than 50 minutes of face to face time with the patient.  Greater than 50% of time was spent in counseling and coordination of care. We have discussed the diagnosis and differential and I answered the patient's questions.    Plan:  Treatment plan and additional workup :  SPLIT night - 3 % at [facility] 20- shift worker, make daytime available if needed. Has a very high Epworth score. Dangerously high. Vivid dreams, no sleep paralysis, she dreams within short naps, within 20 minutes or less.    If not OSA , may need to stay for MSLT. Not on SSRI.      Larey Seat, MD 09/17/9796, 9:21 AM  Certified in Neurology by ABPN Certified in [place] by [facility] [address] [place], [place] [address]

## 2017-10-20 NOTE — Addendum Note (Signed)
Addended by: Larey Seat on: 10/20/2017 09:44 AM   Modules accepted: Orders

## 2017-11-10 ENCOUNTER — Other Ambulatory Visit: Payer: Self-pay | Admitting: Internal Medicine

## 2017-11-25 ENCOUNTER — Encounter (INDEPENDENT_AMBULATORY_CARE_PROVIDER_SITE_OTHER): Payer: BLUE CROSS/BLUE SHIELD

## 2017-12-08 ENCOUNTER — Encounter (INDEPENDENT_AMBULATORY_CARE_PROVIDER_SITE_OTHER): Payer: Self-pay | Admitting: Family Medicine

## 2017-12-08 ENCOUNTER — Ambulatory Visit (INDEPENDENT_AMBULATORY_CARE_PROVIDER_SITE_OTHER): Payer: BLUE CROSS/BLUE SHIELD | Admitting: Family Medicine

## 2017-12-08 VITALS — BP 162/102 | HR 79 | Temp 97.9°F | Ht 65.0 in | Wt 335.0 lb

## 2017-12-08 DIAGNOSIS — I10 Essential (primary) hypertension: Secondary | ICD-10-CM | POA: Diagnosis not present

## 2017-12-08 DIAGNOSIS — Z9189 Other specified personal risk factors, not elsewhere classified: Secondary | ICD-10-CM

## 2017-12-08 DIAGNOSIS — Z6841 Body Mass Index (BMI) 40.0 and over, adult: Secondary | ICD-10-CM

## 2017-12-08 DIAGNOSIS — Z1331 Encounter for screening for depression: Secondary | ICD-10-CM

## 2017-12-08 DIAGNOSIS — R0602 Shortness of breath: Secondary | ICD-10-CM | POA: Diagnosis not present

## 2017-12-08 DIAGNOSIS — Z0289 Encounter for other administrative examinations: Secondary | ICD-10-CM

## 2017-12-08 DIAGNOSIS — R5383 Other fatigue: Secondary | ICD-10-CM | POA: Diagnosis not present

## 2017-12-09 LAB — CBC WITH DIFFERENTIAL
Basophils Absolute: 0 10*3/uL (ref 0.0–0.2)
Basos: 1 %
EOS (ABSOLUTE): 0.4 10*3/uL (ref 0.0–0.4)
Eos: 8 %
HEMATOCRIT: 39.5 % (ref 34.0–46.6)
Hemoglobin: 12.9 g/dL (ref 11.1–15.9)
Immature Grans (Abs): 0 10*3/uL (ref 0.0–0.1)
Immature Granulocytes: 0 %
LYMPHS ABS: 2 10*3/uL (ref 0.7–3.1)
Lymphs: 44 %
MCH: 28.9 pg (ref 26.6–33.0)
MCHC: 32.7 g/dL (ref 31.5–35.7)
MCV: 89 fL (ref 79–97)
MONOCYTES: 5 %
MONOS ABS: 0.2 10*3/uL (ref 0.1–0.9)
NEUTROS ABS: 1.9 10*3/uL (ref 1.4–7.0)
NEUTROS PCT: 42 %
RBC: 4.46 x10E6/uL (ref 3.77–5.28)
RDW: 15 % (ref 12.3–15.4)
WBC: 4.5 10*3/uL (ref 3.4–10.8)

## 2017-12-09 LAB — COMPREHENSIVE METABOLIC PANEL
ALT: 11 IU/L (ref 0–32)
AST: 18 IU/L (ref 0–40)
Albumin/Globulin Ratio: 1.3 (ref 1.2–2.2)
Albumin: 4.2 g/dL (ref 3.5–5.5)
Alkaline Phosphatase: 114 IU/L (ref 39–117)
BILIRUBIN TOTAL: 0.4 mg/dL (ref 0.0–1.2)
BUN/Creatinine Ratio: 17 (ref 9–23)
BUN: 10 mg/dL (ref 6–24)
CALCIUM: 9.4 mg/dL (ref 8.7–10.2)
CHLORIDE: 101 mmol/L (ref 96–106)
CO2: 25 mmol/L (ref 20–29)
Creatinine, Ser: 0.59 mg/dL (ref 0.57–1.00)
GFR calc non Af Amer: 111 mL/min/{1.73_m2} (ref >59)
GFR, EST AFRICAN AMERICAN: 128 mL/min/{1.73_m2} (ref >59)
Globulin, Total: 3.2 g/dL (ref 1.5–4.5)
Glucose: 87 mg/dL (ref 65–99)
Potassium: 4 mmol/L (ref 3.5–5.2)
Sodium: 139 mmol/L (ref 134–144)
TOTAL PROTEIN: 7.4 g/dL (ref 6.0–8.5)

## 2017-12-09 LAB — T3: T3, Total: 111 ng/dL (ref 71–180)

## 2017-12-09 LAB — HEMOGLOBIN A1C
Est. average glucose Bld gHb Est-mCnc: 131 mg/dL
Hgb A1c MFr Bld: 6.2 % — ABNORMAL HIGH (ref 4.8–5.6)

## 2017-12-09 LAB — LIPID PANEL WITH LDL/HDL RATIO
Cholesterol, Total: 214 mg/dL — ABNORMAL HIGH (ref 100–199)
HDL: 41 mg/dL (ref >39)
LDL CALC: 144 mg/dL — AB (ref 0–99)
LDl/HDL Ratio: 3.5 ratio — ABNORMAL HIGH (ref 0.0–3.2)
Triglycerides: 143 mg/dL (ref 0–149)
VLDL CHOLESTEROL CAL: 29 mg/dL (ref 5–40)

## 2017-12-09 LAB — FOLATE: Folate: 7.4 ng/mL (ref >3.0)

## 2017-12-09 LAB — T4, FREE: FREE T4: 1.21 ng/dL (ref 0.82–1.77)

## 2017-12-09 LAB — VITAMIN D 25 HYDROXY (VIT D DEFICIENCY, FRACTURES): VIT D 25 HYDROXY: 4.6 ng/mL — AB (ref 30.0–100.0)

## 2017-12-09 LAB — VITAMIN B12: VITAMIN B 12: 326 pg/mL (ref 232–1245)

## 2017-12-09 LAB — TSH: TSH: 1.31 u[IU]/mL (ref 0.450–4.500)

## 2017-12-09 LAB — INSULIN, RANDOM: INSULIN: 20.9 u[IU]/mL (ref 2.6–24.9)

## 2017-12-09 NOTE — Progress Notes (Signed)
Office: [phone]  /  Fax: [phone]   Dear Dr. Brett Fairy,   Thank you for referring Danielle Shaw to our clinic. The following note includes my evaluation and treatment recommendations.  HPI:   Chief Complaint: OBESITY    Danielle Shaw has been referred by Danielle Seat, Danielle Shaw for consultation regarding her obesity and obesity related comorbidities.    Danielle Shaw (MR# [id number]) is a 47 y.o. female who presents on 12/09/2017 for obesity evaluation and treatment. Current BMI is Body mass index is 55.75 kg/m.[name] Danielle Shaw has been struggling with her weight for many years and has been unsuccessful in either losing weight, maintaining weight loss, or reaching her healthy weight goal.     Danielle Shaw attended our information session and states she is currently in the action stage of change and ready to dedicate time achieving and maintaining a healthier weight. Danielle Shaw is interested in becoming our Danielle Shaw and working on intensive lifestyle modifications including (but not limited to) diet, exercise and weight loss.    Danielle Shaw states her family eats meals together she thinks her family will eat healthier with  her her desired weight loss is 130 lbs she has been heavy most of  her life she started gaining weight after giving birth her heaviest weight ever was 330 lbs. she is a picky eater and doesn't like to eat healthier foods  she skips meals frequently she is frequently drinking liquids with calories   Danielle Shaw feels her energy is lower than it should be. This has worsened with weight gain and has not worsened recently. Danielle Shaw admits to daytime somnolence and admits to waking up still tired. Danielle Shaw is at risk for obstructive sleep apnea. Danielle Shaw has a history of symptoms of daytime Danielle, morning Danielle, morning headache and hypertension. Danielle Shaw generally gets 3 or 4 hours of sleep per night, and states they generally have restless sleep. Snoring is present. Apneic episodes are  present. Epworth Sleepiness Score is 16  EKG was ordered today and shows LVH.  Dyspnea on exertion Danielle Shaw notes increasing shortness of breath with exercising and seems to be worsening over time with weight gain. She notes getting out of breath sooner with activity than she used to. This has not gotten worse recently. EKG was ordered today and shows LVH. Donie denies orthopnea.  Hypertension Danielle Shaw is a 47 y.o. female with hypertension. Her blood pressure is elevated today at 162/102. EKG was ordered today and shows LVH. Brittiany is only occasionally taking her blood pressure medications. Danielle Shaw denies chest pain. She is working weight loss to help control her blood pressure with the goal of decreasing her risk of heart attack and stroke. Tracys blood pressure is not currently controlled.  At risk for cardiovascular disease Danielle Shaw is at a higher than average risk for cardiovascular disease due to obesity. She currently denies any chest pain.  Depression Screen Danielle Shaw's Food and Mood (modified PHQ-9) score was  Depression screen PHQ 2/9 12/08/2017  Decreased Interest 2  Down, Depressed, Hopeless 1  PHQ - 2 Score 3  Altered sleeping 2  Tired, decreased energy 2  Change in appetite 2  Feeling bad or failure about yourself  1  Trouble concentrating 0  Moving slowly or fidgety/restless 1  Suicidal thoughts 1  PHQ-9 Score 12  Difficult doing work/chores Somewhat difficult    ALLERGIES: Allergies  Allergen Reactions  . Latex   . Lisinopril   . Metoprolol     MEDICATIONS: Current Outpatient  Medications on File Prior to Visit  Medication Sig Dispense Refill  . albuterol (PROVENTIL HFA;VENTOLIN HFA) 108 (90 Base) MCG/ACT inhaler INHALE TWO PUFFS BY MOUTH EVERY 6 HOURS AS NEEDED FOR WHEEZING OR SHORTNESS OF BREATH 18 each 1  . aspirin 81 MG tablet Take 81 mg by mouth daily.     . cyclobenzaprine (FLEXERIL) 5 MG tablet Take 1 tablet (5 mg total) by mouth 3 (three) times  daily as needed for muscle spasms. 30 tablet 1  . HYDROcodone-homatropine (HYCODAN) 5-1.5 MG/5ML syrup Take 5-10 mLs by mouth at bedtime as needed for cough. 50 mL 0  . ipratropium (ATROVENT) 0.03 % nasal spray Place 2 sprays into both nostrils every 12 (twelve) hours. 30 mL 12  . losartan-hydrochlorothiazide (HYZAAR) 50-12.5 MG tablet Take 1 tablet by mouth daily. 90 tablet 0  . meloxicam (MOBIC) 15 MG tablet Take 1 tablet (15 mg total) by mouth daily as needed for pain. 30 tablet 1  . modafinil (PROVIGIL) 100 MG tablet Use when sleepy at work or driving , do not use within 4 hours of intended bedtime. 30 tablet 0  . pantoprazole (PROTONIX) 40 MG tablet Take 1 tablet (40 mg total) by mouth daily. 30 tablet 3  . rosuvastatin (CRESTOR) 20 MG tablet Take 1 tablet (20 mg total) by mouth daily. 90 tablet 3   No current facility-administered medications on file prior to visit.     PAST MEDICAL HISTORY: Past Medical History:  Diagnosis Date  . Arrhythmia   . Arthritis   . GERD (gastroesophageal reflux disease)   . Hyperlipidemia   . Hypertension   . IUD 2004  . IUD 2009  . Migraine   . SOB (shortness of breath)     PAST SURGICAL HISTORY: Past Surgical History:  Procedure Laterality Date  . BREAST BIOPSY Right 2011  . BREAST SURGERY  2000   reduction  . CESAREAN SECTION      SOCIAL HISTORY: Social History   Tobacco Use  . Smoking status: Never Smoker  . Smokeless tobacco: Never Used  Substance Use Topics  . Alcohol use: No    Alcohol/week: 0.0 oz  . Drug use: No    FAMILY HISTORY: Family History  Problem Relation Age of Onset  . Heart disease Mother   . High Cholesterol Mother   . High blood pressure Mother   . Diabetes Father   . Heart disease Father   . High blood pressure Father   . High Cholesterol Father   . Heart disease Maternal Grandmother   . Hodgkin's lymphoma Daughter     ROS: Review of Systems  Constitutional: Positive for malaise/Danielle.        Fever/Chills  HENT: Positive for congestion (nasal stuffiness), ear discharge and sinus pain.   Eyes:       Vision Changes Blurry or Double Vision Floaters  Respiratory: Positive for shortness of breath and wheezing.   Cardiovascular: Negative for chest pain and orthopnea.       Positive for Shortness of Breath on exertion  Gastrointestinal:       Rectal Bleeding  Genitourinary: Positive for frequency.  Musculoskeletal:       Muscle or Joint Pain Muscle Stiffness  Neurological: Positive for weakness.  Psychiatric/Behavioral: The Danielle Shaw has insomnia.     PHYSICAL EXAM: Blood pressure (!) 162/102, pulse 79, temperature 97.9 F (36.6 C), temperature source Oral, height 5\' 5"  (1.651 m), weight (!) 335 lb (152 kg), SpO2 94 %. Body mass index is 55.75  kg/m. Physical Exam  Constitutional: She is oriented to person, place, and time. She appears well-developed and well-nourished.  HENT:  Head: Normocephalic and atraumatic.  Nose: Nose normal.  Eyes: EOM are normal. No scleral icterus.  Neck: Normal range of motion. Neck supple. No thyromegaly present.  Cardiovascular: Normal rate and regular rhythm.  Pulmonary/Chest: Effort normal. No respiratory distress.  Abdominal: Soft. There is no tenderness.  + obesity  Musculoskeletal: Normal range of motion.  Range of Motion is normal in all 4 extremities  Neurological: She is alert and oriented to person, place, and time. Coordination normal.  Skin: Skin is warm and dry.  Psychiatric: She has a normal mood and affect. Her behavior is normal.  Vitals reviewed.   RECENT LABS AND TESTS: BMET    Component Value Date/Time   NA 139 12/08/2017 1201   K 4.0 12/08/2017 1201   CL 101 12/08/2017 1201   CO2 25 12/08/2017 1201   GLUCOSE 87 12/08/2017 1201   GLUCOSE 99 12/24/2016 1142   BUN 10 12/08/2017 1201   CREATININE 0.59 12/08/2017 1201   CREATININE 0.64 02/01/2014 1005   CALCIUM 9.4 12/08/2017 1201   GFRNONAA 111 12/08/2017 1201    GFRAA 128 12/08/2017 1201   Lab Results  Component Value Date   HGBA1C 6.2 (H) 12/08/2017   Lab Results  Component Value Date   INSULIN 20.9 12/08/2017   CBC    Component Value Date/Time   WBC 4.5 12/08/2017 1201   WBC 4.2 12/24/2016 1142   RBC 4.46 12/08/2017 1201   RBC 4.64 12/24/2016 1142   HGB 12.9 12/08/2017 1201   HCT 39.5 12/08/2017 1201   PLT 325.0 12/24/2016 1142   MCV 89 12/08/2017 1201   MCH 28.9 12/08/2017 1201   MCH 29.1 02/01/2014 1005   MCHC 32.7 12/08/2017 1201   MCHC 33.6 12/24/2016 1142   RDW 15.0 12/08/2017 1201   LYMPHSABS 2.0 12/08/2017 1201   MONOABS 0.3 02/01/2014 1005   EOSABS 0.4 12/08/2017 1201   BASOSABS 0.0 12/08/2017 1201   Iron/TIBC/Ferritin/ %Sat No results found for: IRON, TIBC, FERRITIN, IRONPCTSAT Lipid Panel     Component Value Date/Time   CHOL 214 (H) 12/08/2017 1201   TRIG 143 12/08/2017 1201   HDL 41 12/08/2017 1201   CHOLHDL 7 12/24/2016 1142   VLDL 27.4 12/24/2016 1142   LDLCALC 144 (H) 12/08/2017 1201   LDLDIRECT 177 (H) 10/28/2010 0916   Hepatic Function Panel     Component Value Date/Time   PROT 7.4 12/08/2017 1201   ALBUMIN 4.2 12/08/2017 1201   AST 18 12/08/2017 1201   ALT 11 12/08/2017 1201   ALKPHOS 114 12/08/2017 1201   BILITOT 0.4 12/08/2017 1201      Component Value Date/Time   TSH 1.310 12/08/2017 1201   TSH 1.125 02/01/2014 1005   TSH 1.310 05/06/2011 1131    ECG  shows NSR with a rate of 77 BPM INDIRECT CALORIMETER done today shows a VO2 of 258 and a REE of 1796.  Her calculated basal metabolic rate is 7867 thus her basal metabolic rate is worse than expected.    ASSESSMENT AND PLAN: Other Danielle - Plan: EKG 12-Lead, Hemoglobin A1c, Insulin, random, VITAMIN D 25 Hydroxy (Vit-D Deficiency, Fractures), Vitamin B12, Folate, T3, T4, free, TSH  Shortness of breath on exertion  Essential hypertension - Plan: Comprehensive metabolic panel, CBC With Differential, Hemoglobin A1c, Insulin, random, Lipid  Panel With LDL/HDL Ratio  Depression screening  At risk for heart disease  Class 3 severe obesity with serious comorbidity and body mass index (BMI) of 50.0 to 59.9 in adult, unspecified obesity type ([place])  PLAN: Danielle Zoei was informed that her Danielle Shaw be related to obesity, depression or many other causes. Labs will be ordered, and in the meanwhile Jamaia has agreed to work on diet, exercise and weight loss to help with Danielle. Proper sleep hygiene was discussed including the need for 7-8 hours of quality sleep each night. A sleep study was not ordered based on symptoms and Epworth score. We will order indirect calorimetry today.  Dyspnea on exertion Shineka's shortness of breath appears to be obesity related and exercise induced. She has agreed to work on weight loss and gradually increase exercise to treat her exercise induced shortness of breath. If Briell follows our instructions and loses weight without improvement of her shortness of breath, we will plan to refer to pulmonology. We will order labs and indirect calorimetry today. We will monitor this condition regularly. Rozelia agrees to this plan.  Hypertension We discussed sodium restriction, working on healthy weight loss, and a regular exercise program as the means to achieve improved blood pressure control. Tyera agreed with this plan and agreed to follow up as directed. We will continue to monitor her blood pressure as well as her progress with the above lifestyle modifications. She will continue her medications as prescribed and will watch for signs of hypotension as she continues her lifestyle modifications. CMP and EKG were ordered today.  Cardiovascular risk counseling Ady was given extended (15 minutes) coronary artery disease prevention counseling today. She is 47 y.o. female and has risk factors for heart disease including obesity and hypertension. We discussed intensive lifestyle modifications today with an emphasis on  specific weight loss instructions and strategies. Pt was also informed of the importance of increasing exercise and decreasing saturated fats to help prevent heart disease.  Depression Screen Jessyca had a moderately positive depression screening. Depression is commonly associated with obesity and often results in emotional eating behaviors. We will monitor this closely and work on CBT to help improve the non-hunger eating patterns. Referral to Psychology Shaw be required if no improvement is seen as she continues in our clinic.  Obesity Willean is currently in the action stage of change and her goal is to continue with weight loss efforts. I recommend Thena begin the structured treatment plan as follows:  She has agreed to follow the Category 2 plan +1 if needed Loney has been instructed to eventually work up to a goal of 150 minutes of combined cardio and strengthening exercise per week for weight loss and overall health benefits. We discussed the following Behavioral Modification Strategies today: better snacking choices, planning for success, increasing lean protein intake, increasing vegetables and work on meal planning and easy cooking plans   She was informed of the importance of frequent follow up visits to maximize her success with intensive lifestyle modifications for her multiple health conditions. She was informed we would discuss her lab results at her next visit unless there is a critical issue that needs to be addressed sooner. Emberly agreed to keep her next visit at the agreed upon time to discuss these results.    OBESITY BEHAVIORAL INTERVENTION VISIT  Today's visit was # 1 out of 22.  Starting weight: 335 lbs Starting date: 12/08/17 Today's weight : 335 lbs Today's date: 12/08/2017 Total lbs lost to date: 0 (Patients must lose 7 lbs in the first 6 months to continue with  counseling)   ASK: We discussed the diagnosis of obesity with Danielle Shaw today and Jailyne agreed to  give [place] permission to discuss obesity behavioral modification therapy today.  ASSESS: Zaylei has the diagnosis of obesity and her BMI today is 55.75 Floraine is in the action stage of change   ADVISE: Bianney was educated on the multiple health risks of obesity as well as the benefit of weight loss to improve her health. She was advised of the need for long term treatment and the importance of lifestyle modifications.  AGREE: Multiple dietary modification options and treatment options were discussed and  Makina agreed to the above obesity treatment plan.   I, Doreene Nest, am acting as transcriptionist for  Eber Jones, Danielle Shaw   I have reviewed the above documentation for accuracy and completeness, and I agree with the above. - Ilene Qua, Danielle Shaw

## 2017-12-13 ENCOUNTER — Ambulatory Visit (INDEPENDENT_AMBULATORY_CARE_PROVIDER_SITE_OTHER): Payer: BLUE CROSS/BLUE SHIELD | Admitting: Neurology

## 2017-12-13 DIAGNOSIS — G4733 Obstructive sleep apnea (adult) (pediatric): Secondary | ICD-10-CM | POA: Diagnosis not present

## 2017-12-13 DIAGNOSIS — R4189 Other symptoms and signs involving cognitive functions and awareness: Secondary | ICD-10-CM

## 2017-12-13 DIAGNOSIS — E662 Morbid (severe) obesity with alveolar hypoventilation: Secondary | ICD-10-CM

## 2017-12-13 DIAGNOSIS — G4719 Other hypersomnia: Secondary | ICD-10-CM

## 2017-12-13 DIAGNOSIS — R0683 Snoring: Secondary | ICD-10-CM

## 2017-12-13 DIAGNOSIS — Z6841 Body Mass Index (BMI) 40.0 and over, adult: Secondary | ICD-10-CM

## 2017-12-13 DIAGNOSIS — G4753 Recurrent isolated sleep paralysis: Secondary | ICD-10-CM

## 2017-12-14 ENCOUNTER — Other Ambulatory Visit: Payer: Self-pay

## 2017-12-14 ENCOUNTER — Encounter (HOSPITAL_COMMUNITY): Payer: Self-pay | Admitting: Emergency Medicine

## 2017-12-14 DIAGNOSIS — I10 Essential (primary) hypertension: Secondary | ICD-10-CM | POA: Diagnosis not present

## 2017-12-14 DIAGNOSIS — R9431 Abnormal electrocardiogram [ECG] [EKG]: Secondary | ICD-10-CM | POA: Diagnosis not present

## 2017-12-14 DIAGNOSIS — N118 Other chronic tubulo-interstitial nephritis: Secondary | ICD-10-CM | POA: Diagnosis not present

## 2017-12-14 DIAGNOSIS — Z7982 Long term (current) use of aspirin: Secondary | ICD-10-CM | POA: Diagnosis not present

## 2017-12-14 DIAGNOSIS — R1032 Left lower quadrant pain: Secondary | ICD-10-CM | POA: Insufficient documentation

## 2017-12-14 DIAGNOSIS — N12 Tubulo-interstitial nephritis, not specified as acute or chronic: Secondary | ICD-10-CM | POA: Diagnosis not present

## 2017-12-14 DIAGNOSIS — R109 Unspecified abdominal pain: Secondary | ICD-10-CM | POA: Diagnosis not present

## 2017-12-14 DIAGNOSIS — M5489 Other dorsalgia: Secondary | ICD-10-CM | POA: Diagnosis not present

## 2017-12-14 DIAGNOSIS — M79605 Pain in left leg: Secondary | ICD-10-CM | POA: Insufficient documentation

## 2017-12-14 DIAGNOSIS — I4581 Long QT syndrome: Secondary | ICD-10-CM | POA: Insufficient documentation

## 2017-12-14 DIAGNOSIS — Z79899 Other long term (current) drug therapy: Secondary | ICD-10-CM | POA: Insufficient documentation

## 2017-12-14 DIAGNOSIS — M549 Dorsalgia, unspecified: Secondary | ICD-10-CM | POA: Diagnosis not present

## 2017-12-14 LAB — BASIC METABOLIC PANEL
Anion gap: 10 (ref 5–15)
BUN: 13 mg/dL (ref 6–20)
CALCIUM: 9.4 mg/dL (ref 8.9–10.3)
CHLORIDE: 101 mmol/L (ref 101–111)
CO2: 26 mmol/L (ref 22–32)
Creatinine, Ser: 0.78 mg/dL (ref 0.44–1.00)
GFR calc Af Amer: >60 mL/min (ref >60)
GFR calc non Af Amer: >60 mL/min (ref >60)
GLUCOSE: 101 mg/dL — AB (ref 65–99)
Potassium: 3.5 mmol/L (ref 3.5–5.1)
Sodium: 137 mmol/L (ref 135–145)

## 2017-12-14 LAB — CBC
HEMATOCRIT: 40.4 % (ref 36.0–46.0)
Hemoglobin: 13.7 g/dL (ref 12.0–15.0)
MCH: 29.5 pg (ref 26.0–34.0)
MCHC: 33.9 g/dL (ref 30.0–36.0)
MCV: 86.9 fL (ref 78.0–100.0)
Platelets: 379 10*3/uL (ref 150–400)
RBC: 4.65 MIL/uL (ref 3.87–5.11)
RDW: 13.7 % (ref 11.5–15.5)
WBC: 5.2 10*3/uL (ref 4.0–10.5)

## 2017-12-14 LAB — URINALYSIS, ROUTINE W REFLEX MICROSCOPIC
BILIRUBIN URINE: NEGATIVE
GLUCOSE, UA: NEGATIVE mg/dL
Ketones, ur: NEGATIVE mg/dL
NITRITE: NEGATIVE
Protein, ur: NEGATIVE mg/dL
SPECIFIC GRAVITY, URINE: 1.011 (ref 1.005–1.030)
WBC, UA: >50 WBC/hpf — ABNORMAL HIGH (ref 0–5)
pH: 7 (ref 5.0–8.0)

## 2017-12-14 LAB — I-STAT BETA HCG BLOOD, ED (MC, WL, AP ONLY)

## 2017-12-14 NOTE — ED Triage Notes (Signed)
Patient is complaining of left back pain that radiates down her left side and her left leg down to her foot. Patient states the muscle relaxer that md gave her is not working. Patient is afraid due to family hx of heart disease.

## 2017-12-15 ENCOUNTER — Emergency Department (HOSPITAL_COMMUNITY)
Admission: EM | Admit: 2017-12-15 | Discharge: 2017-12-15 | Disposition: A | Discharge status: Home / Self Care | Payer: BLUE CROSS/BLUE SHIELD | Attending: Emergency Medicine | Admitting: Emergency Medicine

## 2017-12-15 ENCOUNTER — Emergency Department (HOSPITAL_COMMUNITY): Payer: BLUE CROSS/BLUE SHIELD

## 2017-12-15 ENCOUNTER — Encounter (HOSPITAL_COMMUNITY): Payer: Self-pay

## 2017-12-15 DIAGNOSIS — N12 Tubulo-interstitial nephritis, not specified as acute or chronic: Secondary | ICD-10-CM

## 2017-12-15 DIAGNOSIS — R109 Unspecified abdominal pain: Secondary | ICD-10-CM | POA: Diagnosis not present

## 2017-12-15 DIAGNOSIS — R9431 Abnormal electrocardiogram [ECG] [EKG]: Secondary | ICD-10-CM

## 2017-12-15 DIAGNOSIS — M549 Dorsalgia, unspecified: Secondary | ICD-10-CM | POA: Diagnosis not present

## 2017-12-15 LAB — I-STAT TROPONIN, ED: TROPONIN I, POC: 0 ng/mL (ref 0.00–0.08)

## 2017-12-15 LAB — D-DIMER, QUANTITATIVE: D-Dimer, Quant: 0.32 ug/mL-FEU (ref 0.00–0.50)

## 2017-12-15 IMAGING — CR DG CHEST 2V
2 series · 2 of 2 positions shown · non-contrast
Comparison: Radiographs [DATE], CT [DATE]

CLINICAL DATA: Back and flank pain.

EXAM:
CHEST - 2 VIEW

[w chest pa]
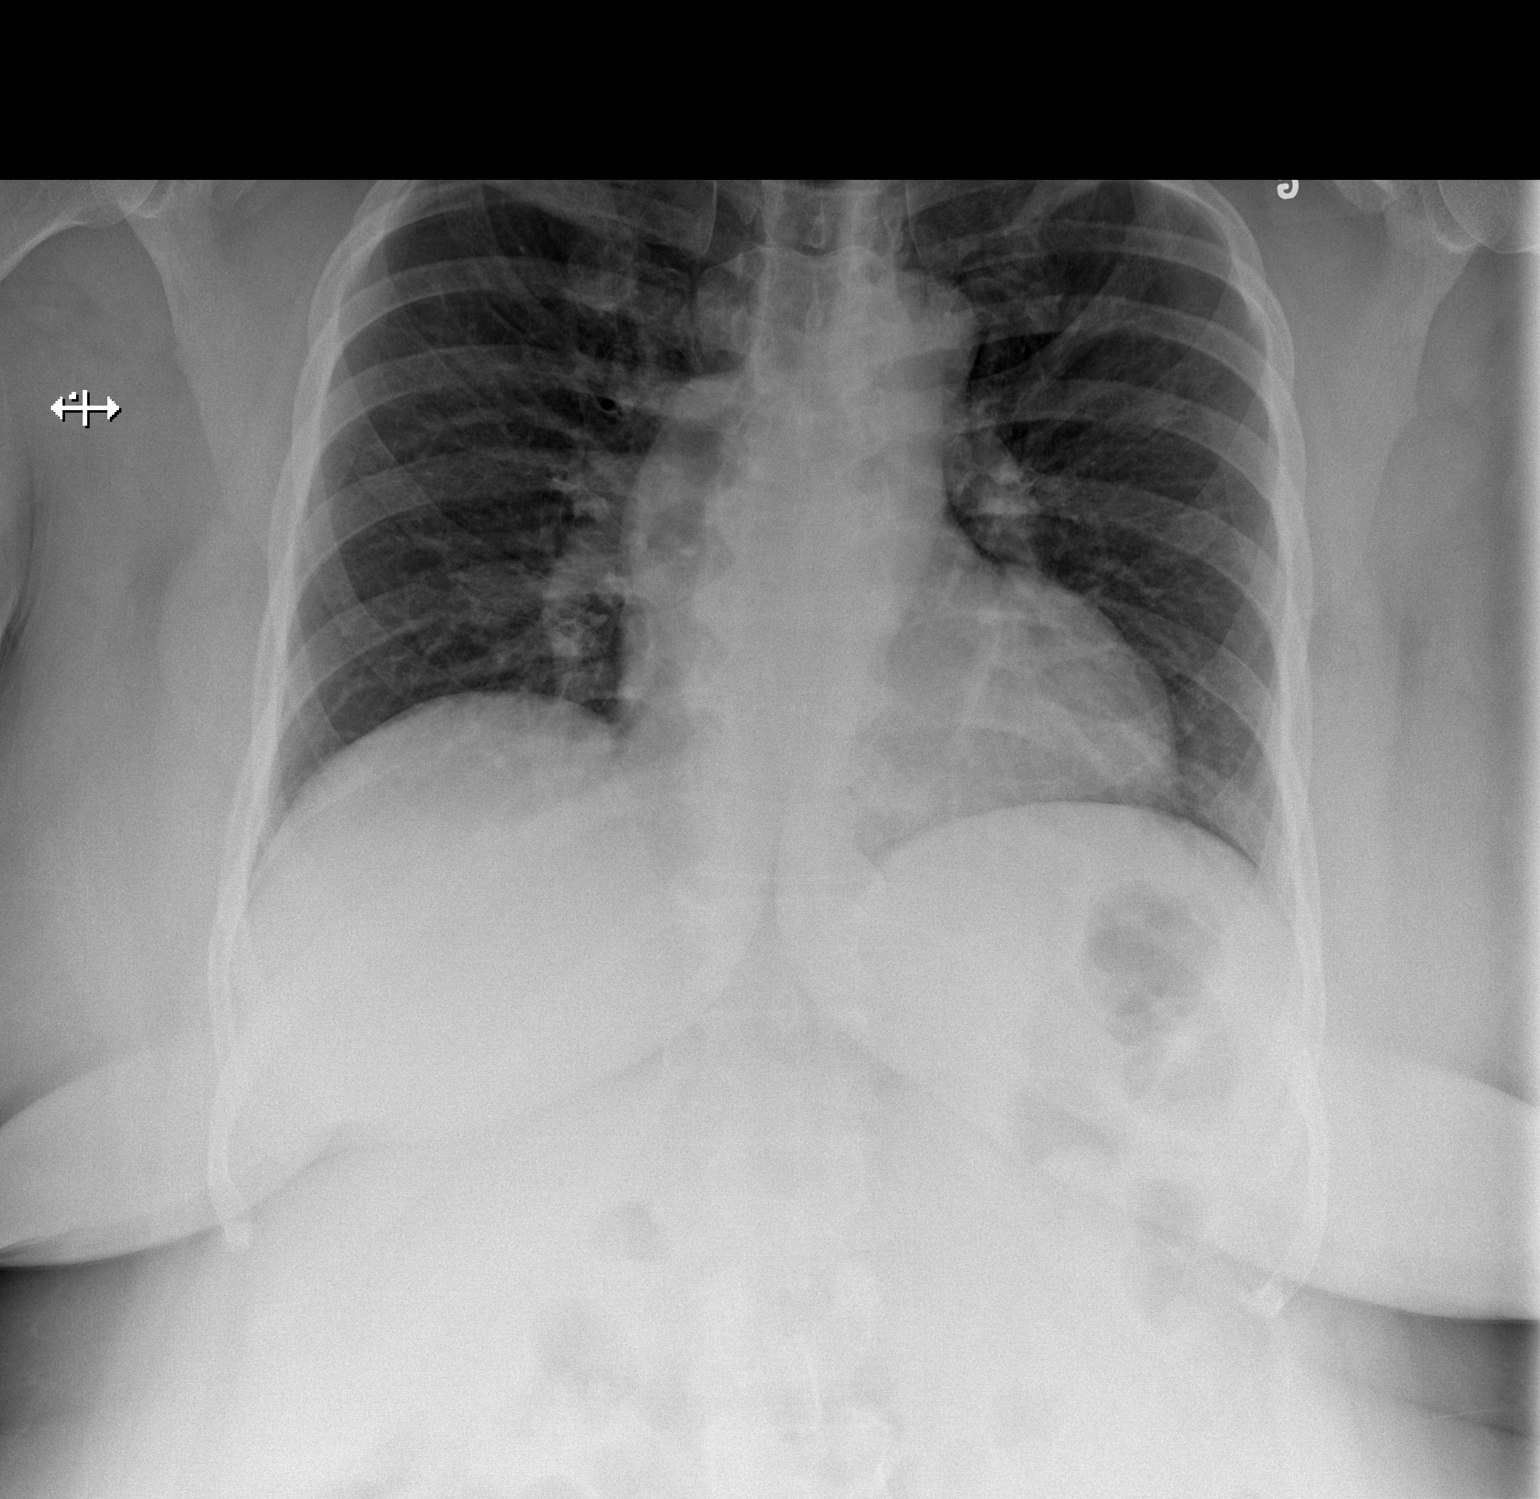

[w chest lat]
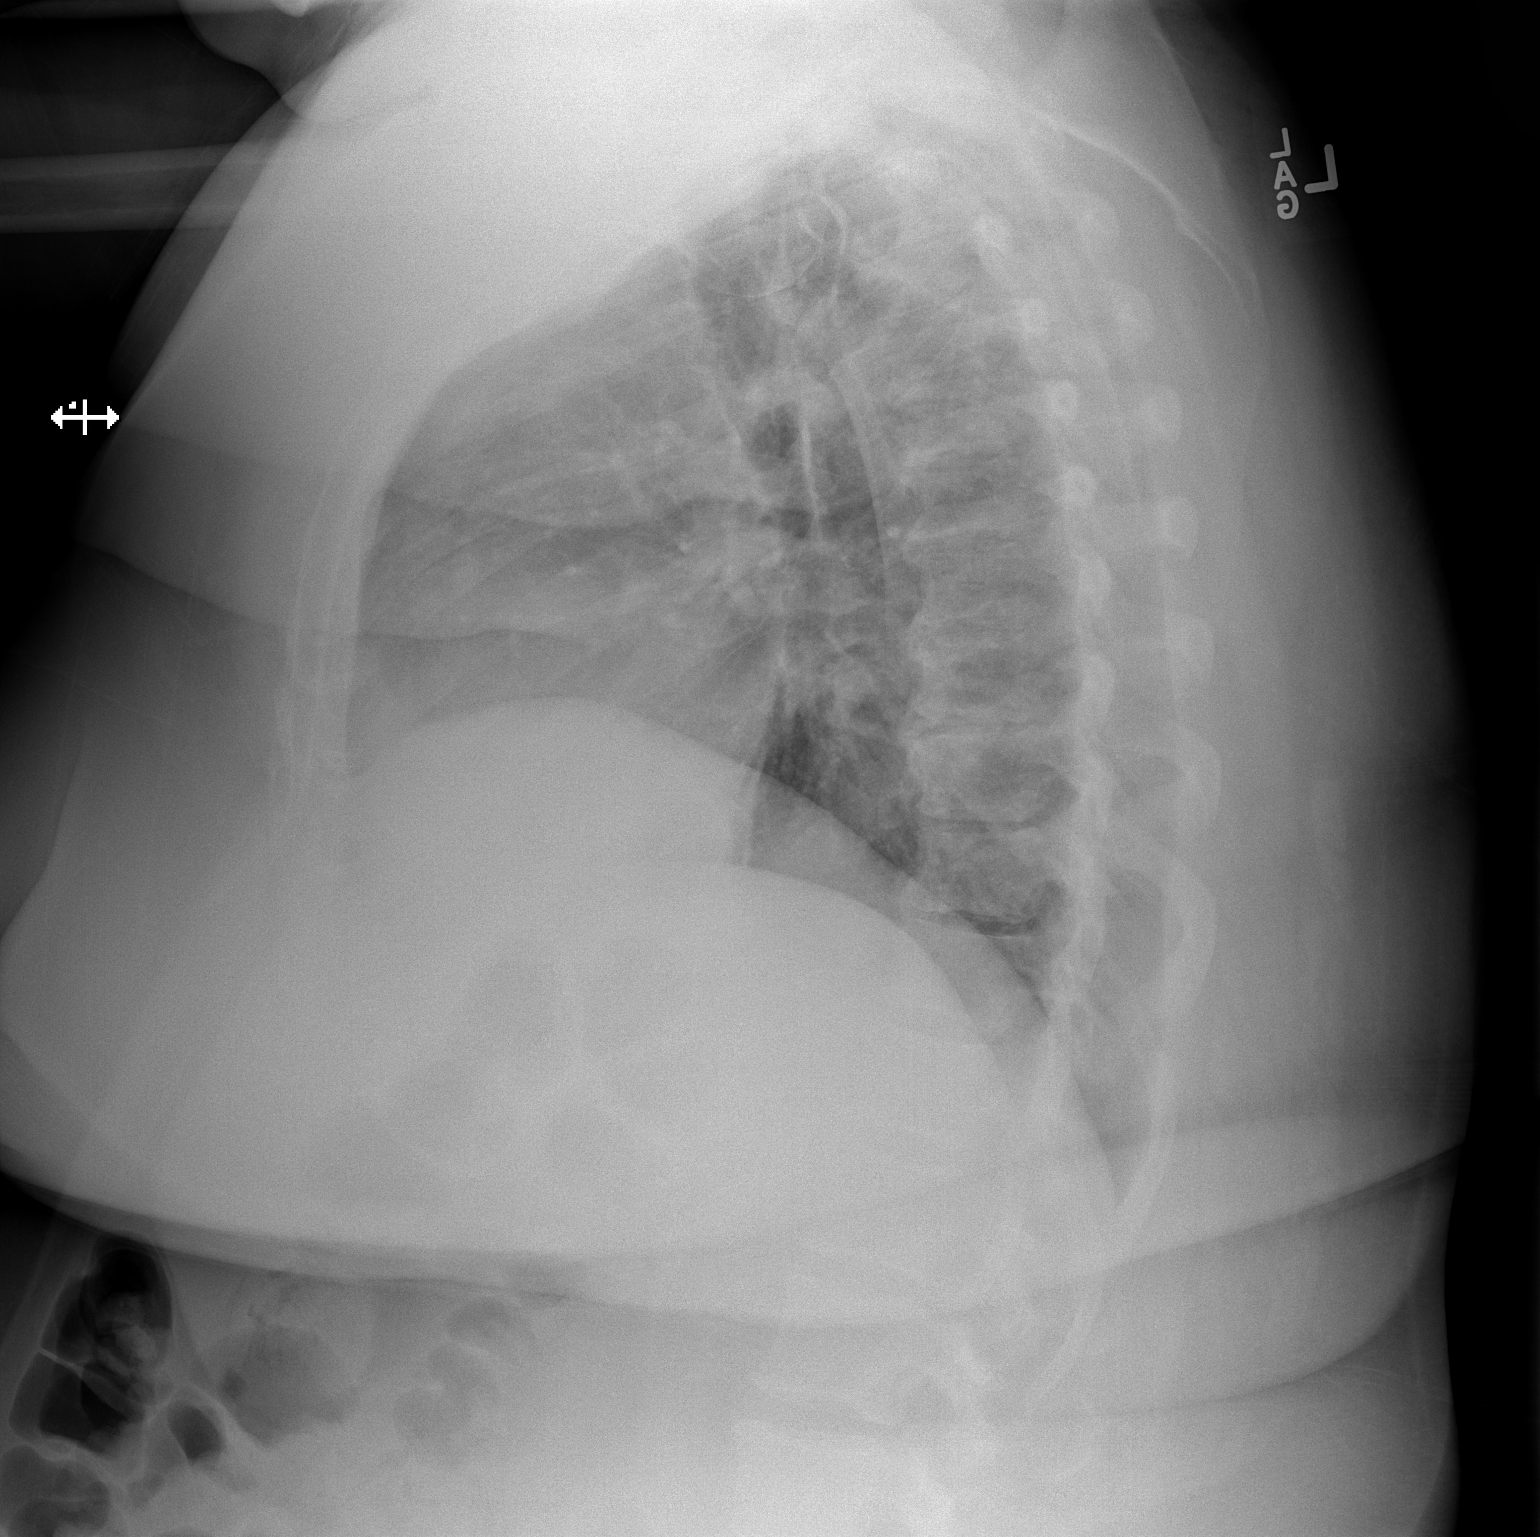

[2 of 2 positions shown; findings below may reference images not displayed]

FINDINGS: The cardiomediastinal contours are unchanged. No pulmonary edema. No
consolidation, pleural effusion, or pneumothorax. No acute osseous
abnormalities are seen. Degenerative change in the spine.
IMPRESSION: No acute pulmonary process.

## 2017-12-15 IMAGING — CT CT RENAL STONE PROTOCOL
2 of 4 series · 16 of 46 positions shown, 18 images · non-contrast
Comparison: None.

CLINICAL DATA: Left flank pain.

EXAM:
CT ABDOMEN AND PELVIS WITHOUT CONTRAST
TECHNIQUE: Multidetector CT imaging of the abdomen and pelvis was performed
following the standard protocol without IV contrast.

[Series 2: axial st · axial · 0.87mm/px · z∈[+1011,+1436]mm · 13 of 97 slices shown, 15 images]
[im 6/97  soft-tissue]
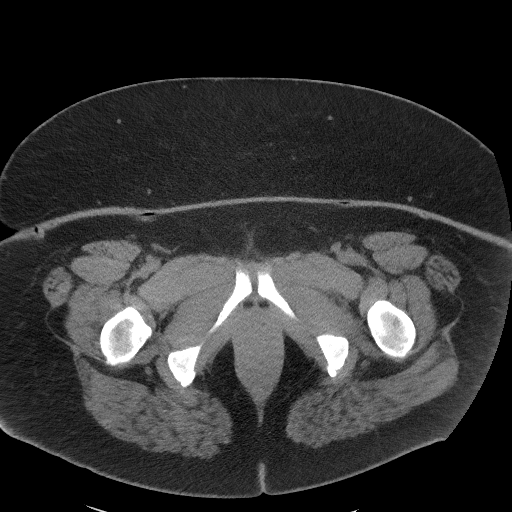
[im 6/97  bone]
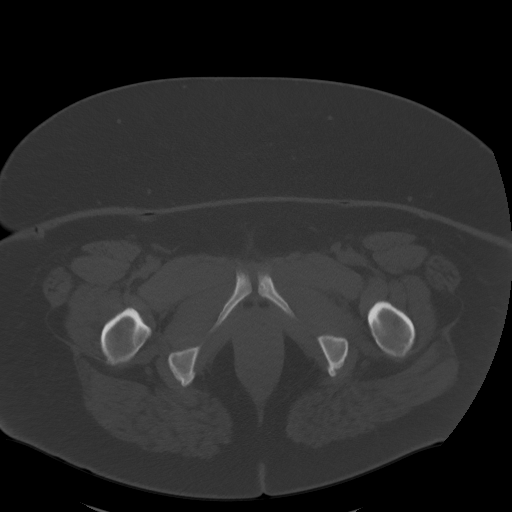
[im 16/97  soft-tissue]
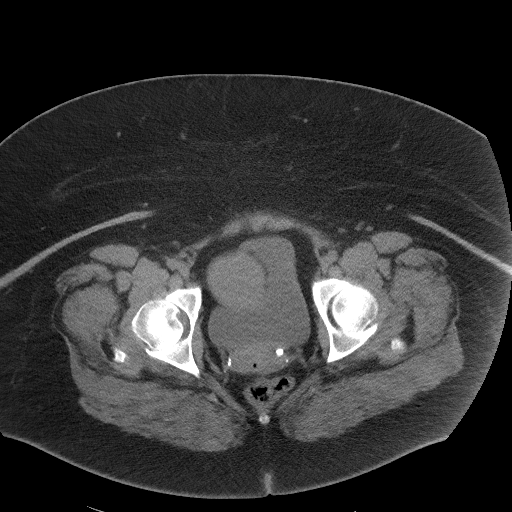
[im 21/97  soft-tissue]
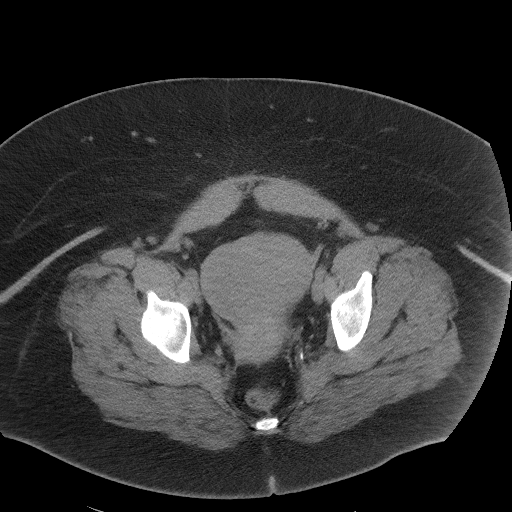
[im 26/97  soft-tissue]
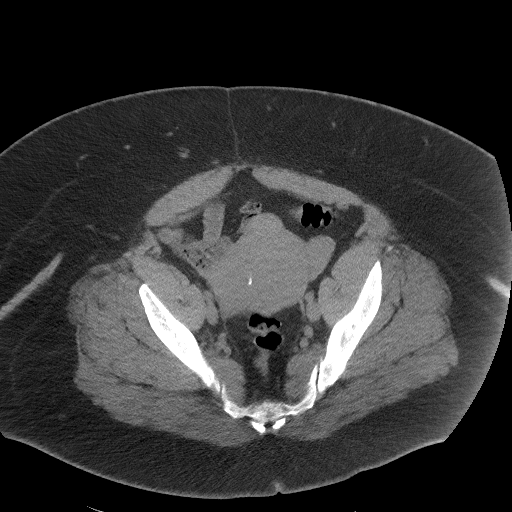
[im 36/97  soft-tissue]
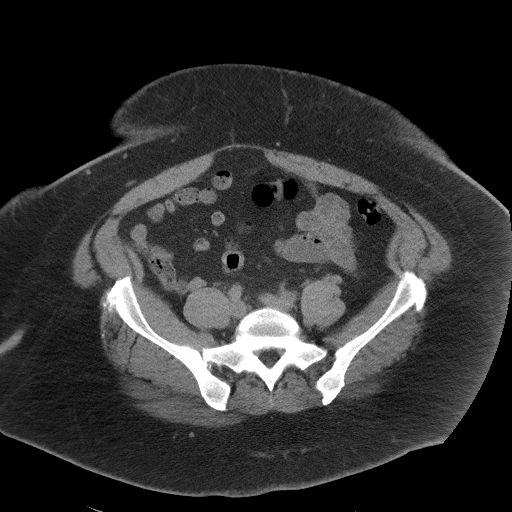
[im 41/97  soft-tissue]
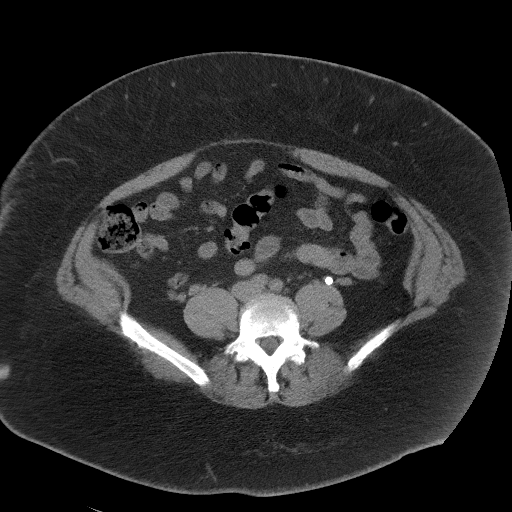
[im 51/97  soft-tissue]
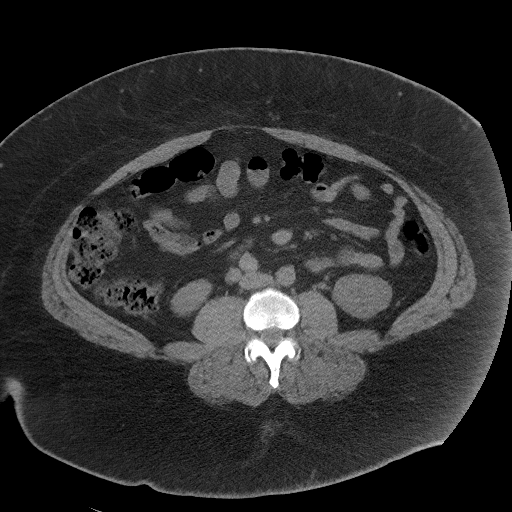
[im 56/97  soft-tissue]
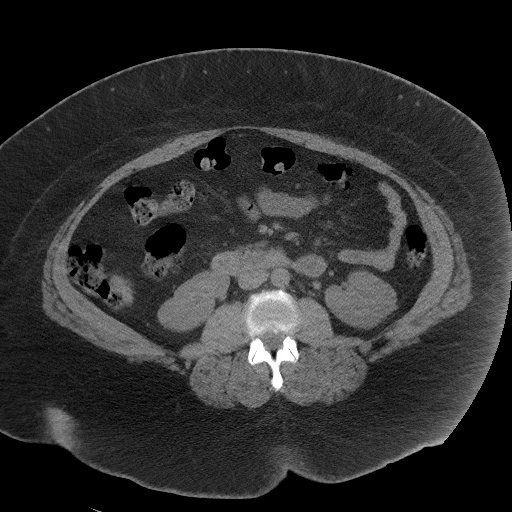
[im 61/97  soft-tissue]
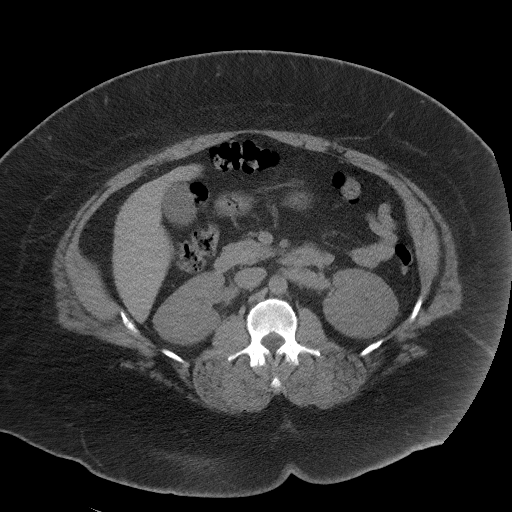
[im 61/97  bone]
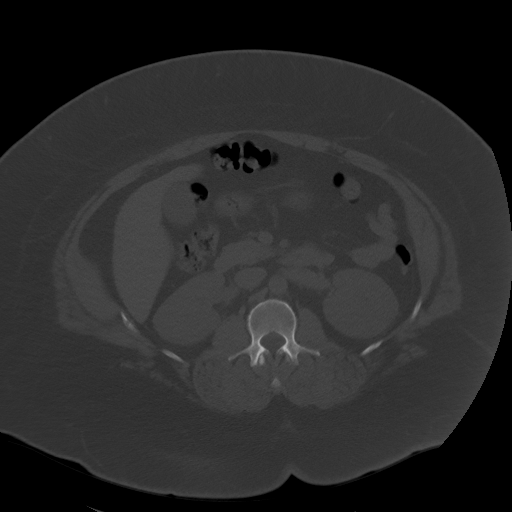
[im 71/97  soft-tissue]
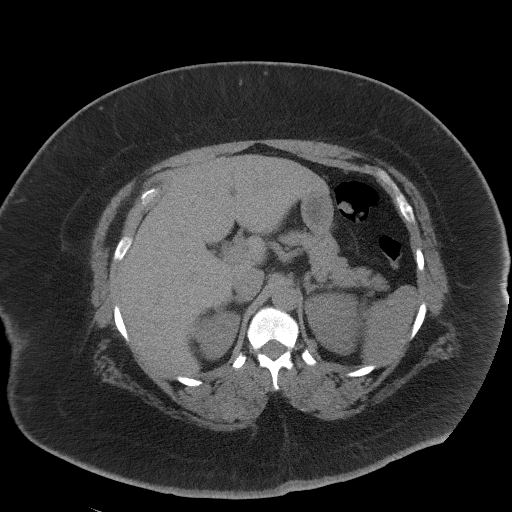
[im 76/97  soft-tissue]
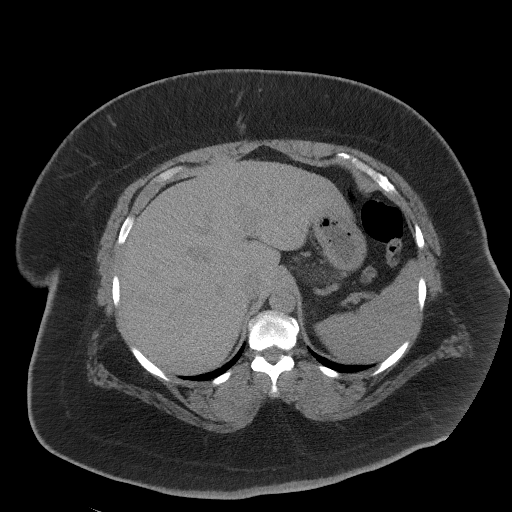
[im 81/97  soft-tissue]
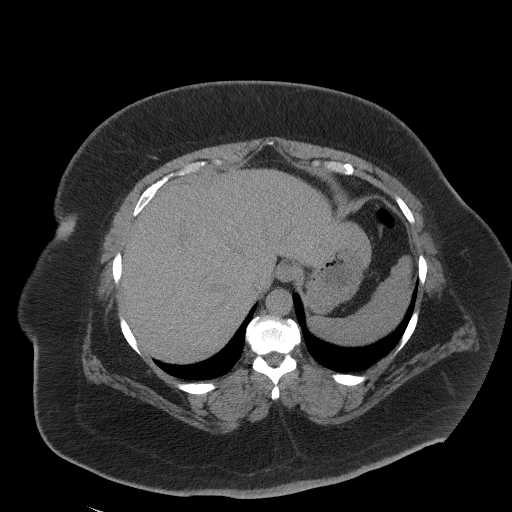
[im 91/97  soft-tissue]
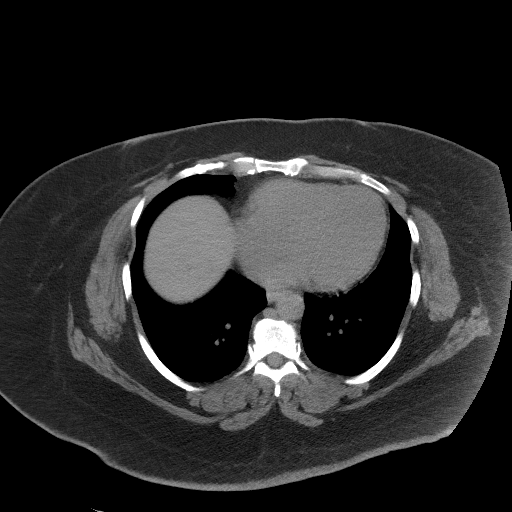

[Series 4: coronal · coronal · 0.80mm/px · 3 of 160 slices shown]
[im 54/160  soft-tissue]
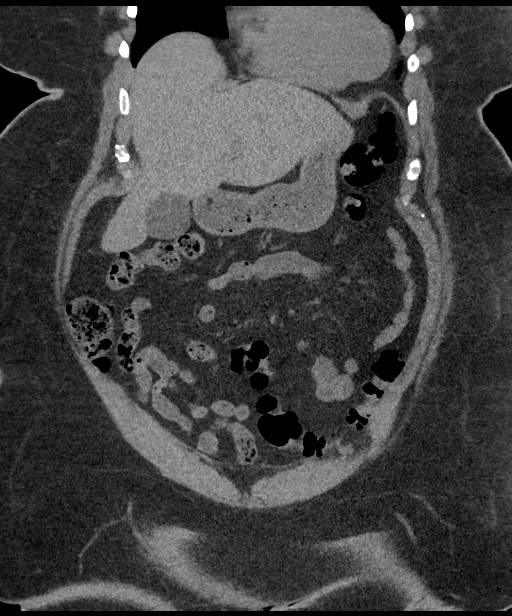
[im 71/160  soft-tissue]
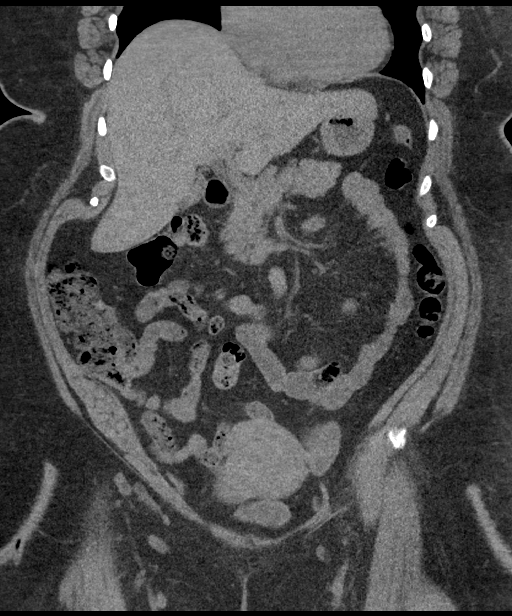
[im 89/160  soft-tissue]
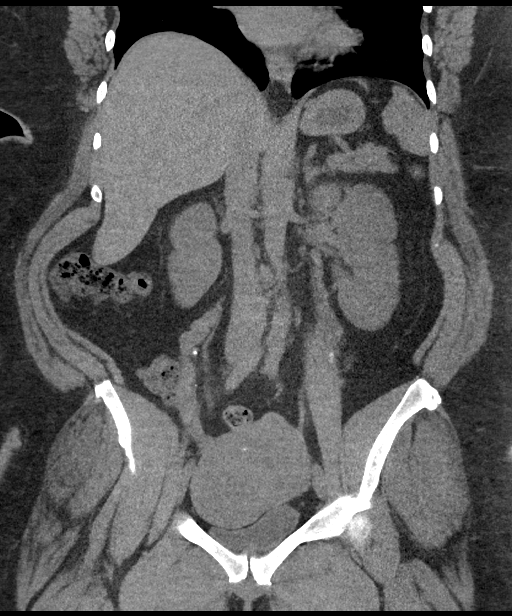

[16 of 46 positions shown; findings below may reference images not displayed]

FINDINGS: Lower chest: Lung bases are clear.

Hepatobiliary: Prominent liver spanning 20 cm cranial caudal. No
focal lesion on noncontrast exam. Gallbladder physiologically
distended, no calcified stone. No biliary dilatation.

Pancreas: No ductal dilatation or inflammation.

Spleen: Normal in size without focal abnormality.

Adrenals/Urinary Tract: Normal adrenal glands. Mild left perinephric
edema without hydronephrosis. No urolithiasis. Ureter is
decompressed without definite ureteral stone. Unremarkable
noncontrast appearance of the right kidney. Urinary bladder is
partially distended. No bladder wall thickening or stone.

Stomach/Bowel: Stomach is within normal limits. Appendix appears
normal. No evidence of bowel wall thickening, distention, or
inflammatory changes.

Vascular/Lymphatic: Abdominal aorta is normal in caliber.
Phleboliths in the ovarian veins and pelvis. No adenopathy.

Reproductive: Enlarged fibroid uterus. IUD in place, exact location
difficult to discern due to uterine fibroids. No adnexal mass.

Other: No free air, free fluid, or intra-abdominal fluid collection.

Musculoskeletal: There are no acute or suspicious osseous
abnormalities. Facet arthropathy in the lower lumbar spine.
IMPRESSION: 1. Mild left perinephric stranding without hydronephrosis or
urolithiasis. Findings may reflect urinary tract infection or
recently passed stone.
2. Fibroid uterus with IUD in place.

## 2017-12-15 MED ORDER — ONDANSETRON 4 MG PO TBDP: 4.0000 mg | ORAL_TABLET | ORAL | 0 refills | Status: DC | PRN

## 2017-12-15 MED ORDER — CEPHALEXIN 500 MG PO CAPS: 500.0000 mg | ORAL_CAPSULE | Freq: Two times a day (BID) | ORAL | 0 refills | Status: AC

## 2017-12-15 MED ORDER — CEPHALEXIN 500 MG PO CAPS
500.0000 mg | ORAL_CAPSULE | Freq: Once | ORAL | Status: AC
  Administered 2017-12-15: 500 mg via ORAL
  Filled 2017-12-15: qty 1

## 2017-12-15 MED ORDER — IBUPROFEN 200 MG PO TABS
600.0000 mg | ORAL_TABLET | Freq: Once | ORAL | Status: AC
  Administered 2017-12-15: 600 mg via ORAL
  Filled 2017-12-15: qty 3

## 2017-12-15 NOTE — ED Provider Notes (Signed)
[facility] DEPT Provider Note   CSN: [id number] Arrival date & time: 12/14/17  2018     History   Chief Complaint Chief Complaint  Patient presents with  . Back Pain  . Leg Pain  . Flank Pain    HPI Danielle Shaw is a 47 y.o. female who presents today for evaluation of left sided back pain.  She reports that she has not any fevers, she reports increased urgency and frequency, nausea but no vomiting or diarrhea.  She denies chest pain or shortness of breath, however she does report concern is she has reportedly had multiple relatives die from heart attacks before the age of 37.  She reports that her symptoms going on for the past few days.  She also reports that her left arm specifically the wrist feels tingly, and her left leg/lateral three toes are tingly.  She reports that the arm and leg symptoms are not new.  She denies any trauma.    HPI  Past Medical History:  Diagnosis Date  . Arrhythmia   . Arthritis   . GERD (gastroesophageal reflux disease)   . Hyperlipidemia   . Hypertension   . IUD 2004  . IUD 2009  . Migraine   . SOB (shortness of breath)     Patient Active Problem List   Diagnosis Date Noted  . Class 3 severe obesity due to excess calories with serious comorbidity and body mass index (BMI) of 50.0 to 59.9 in adult ([place]) 10/20/2017  . Sleep paralysis, recurrent isolated 10/20/2017  . Complaint related to dreams 10/20/2017  . Excessive daytime sleepiness 10/20/2017  . Arthralgia 12/26/2016  . Snoring 01/22/2016  . Wheezing 09/10/2015  . Allergic rhinitis 05/03/2015  . Right knee pain 04/27/2013  . Annual physical exam 07/15/2012  . Bronchitis 10/15/2011  . Palpitations 05/11/2011  . Presence of intrauterine contraceptive device (IUD)   . Morbid obesity ([place]) 03/07/2007  . MIGRAINE, UNSPEC., W/O INTRACTABLE MIGRAINE 09/23/2006  . HYPERTENSION, BENIGN SYSTEMIC 09/23/2006    Past Surgical History:  Procedure  Laterality Date  . BREAST BIOPSY Right 2011  . BREAST SURGERY  2000   reduction  . CESAREAN SECTION       OB History    Gravida  2   Para  2   Term  2   Preterm  0   AB  0   Living  2     SAB  0   TAB  0   Ectopic  0   Multiple  0   Live Births               Home Medications    Prior to Admission medications   Medication Sig Start Date End Date Taking? Authorizing Provider  albuterol (PROVENTIL HFA;VENTOLIN HFA) 108 (90 Base) MCG/ACT inhaler INHALE TWO PUFFS BY MOUTH EVERY 6 HOURS AS NEEDED FOR WHEEZING OR SHORTNESS OF BREATH 11/11/17  Yes Hoyt Koch, MD  aspirin 81 MG tablet Take 81 mg by mouth daily.    Yes [provider]  cyclobenzaprine (FLEXERIL) 5 MG tablet Take 1 tablet (5 mg total) by mouth 3 (three) times daily as needed for muscle spasms. 12/24/16  Yes Hoyt Koch, MD  HYDROcodone-homatropine [facility]) 5-1.5 MG/5ML syrup Take 5-10 mLs by mouth at bedtime as needed for cough. 10/11/17  Yes Hoyt Koch, MD  ipratropium (ATROVENT) 0.03 % nasal spray Place 2 sprays into both nostrils every 12 (twelve) hours. 01/21/16  Yes  Hoyt Koch, MD  levocetirizine (XYZAL) 5 MG tablet Take 5 mg by mouth every evening.   Yes [provider]  losartan-hydrochlorothiazide (HYZAAR) 50-12.5 MG tablet Take 1 tablet by mouth daily. 10/11/17  Yes Hoyt Koch, MD  meloxicam (MOBIC) 15 MG tablet Take 1 tablet (15 mg total) by mouth daily as needed for pain. 10/11/17  Yes Hoyt Koch, MD  modafinil (PROVIGIL) 100 MG tablet Use when sleepy at work or driving , do not use within 4 hours of intended bedtime. 10/20/17  Yes Dohmeier, Asencion Partridge, MD  pantoprazole (PROTONIX) 40 MG tablet Take 1 tablet (40 mg total) by mouth daily. 05/03/15  Yes Hoyt Koch, MD  rosuvastatin (CRESTOR) 20 MG tablet Take 1 tablet (20 mg total) by mouth daily. 01/01/17  Yes Hoyt Koch, MD  cephALEXin (KEFLEX) 500 MG capsule  Take 1 capsule (500 mg total) by mouth 2 (two) times daily for 14 days. 12/15/17 12/29/17  Lorin Glass, PA-C    Family History Family History  Problem Relation Age of Onset  . Heart disease Mother   . High Cholesterol Mother   . High blood pressure Mother   . Diabetes Father   . Heart disease Father   . High blood pressure Father   . High Cholesterol Father   . Heart disease Maternal Grandmother   . Hodgkin's lymphoma Daughter     Social History Social History   Tobacco Use  . Smoking status: Never Smoker  . Smokeless tobacco: Never Used  Substance Use Topics  . Alcohol use: No    Alcohol/week: 0.0 oz  . Drug use: No     Allergies   Latex; Lisinopril; and Metoprolol   Review of Systems Review of Systems  Constitutional: Negative for chills and fever.  HENT: Negative for ear pain and sore throat.   Eyes: Negative for pain and visual disturbance.  Respiratory: Negative for cough and shortness of breath.   Cardiovascular: Negative for chest pain and palpitations.  Gastrointestinal: Negative for abdominal pain and vomiting.  Genitourinary: Positive for flank pain, frequency and urgency. Negative for dysuria and hematuria.  Musculoskeletal: Positive for back pain. Negative for arthralgias, neck pain and neck stiffness.  Skin: Negative for color change and rash.  Neurological: Negative for seizures and syncope.  All other systems reviewed and are negative.    Physical Exam Updated Vital Signs BP 125/82   Pulse 66   Temp 98.1 F (36.7 C) (Oral)   Resp 14   Ht 5\' 5"  (1.651 m)   Wt (!) 152 kg (335 lb)   SpO2 99%   BMI 55.75 kg/m   Physical Exam  Constitutional: She appears well-developed and well-nourished. No distress.  HENT:  Head: Normocephalic and atraumatic.  Mouth/Throat: Oropharynx is clear and moist.  Eyes: Conjunctivae are normal.  Neck: Normal range of motion. Neck supple.  Cardiovascular: Normal rate, regular rhythm, normal heart sounds  and intact distal pulses.  No murmur heard. 2+ radial, DP, PT pulses bilaterally.  Pulmonary/Chest: Effort normal and breath sounds normal. No stridor. No respiratory distress. She has no wheezes.  Abdominal: Soft. Bowel sounds are normal. She exhibits no distension. There is no tenderness. There is no guarding.  Left flank has mild tenderness to percussion.  Musculoskeletal: She exhibits no edema.  Left thoracic back is TTP, however this does not re-create or exacerbate her pain exactly.    Palpation over left buttock/sciatic notch worsens her tingling in the left leg.  Palpation  over left anterior chest and percussion over carpal tunnel worsens her arm tingling.   5/5 strength in bilateral upper and lower extremities.   Neurological: She is alert.  Skin: Skin is warm and dry.  Psychiatric: She has a normal mood and affect.  Nursing note and vitals reviewed.    ED Treatments / Results  Labs (all labs ordered are listed, but only abnormal results are displayed) Labs Reviewed  URINALYSIS, ROUTINE W REFLEX MICROSCOPIC - Abnormal; Notable for the following components:      Result Value   APPearance HAZY (*)    Hgb urine dipstick MODERATE (*)    Leukocytes, UA LARGE (*)    WBC, UA >50 (*)    Bacteria, UA RARE (*)    All other components within normal limits  BASIC METABOLIC PANEL - Abnormal; Notable for the following components:   Glucose, Bld 101 (*)    All other components within normal limits  URINE CULTURE  CBC  D-DIMER, QUANTITATIVE (NOT AT [facility])  I-STAT BETA HCG BLOOD, ED ([facility], [facility], [facility] ONLY)  I-STAT TROPONIN, ED    EKG EKG Interpretation  Date/Time:  Wednesday Shaw 22 2019 02:32:01 EDT Ventricular Rate:  67 PR Interval:    QRS Duration: 87 QT Interval:  598 QTC Calculation: 632 R Axis:   29 Text Interpretation:  Sinus rhythm Borderline T abnormalities, diffuse leads Prolonged QT interval When compared with ECG of 05/06/2011, QT has lengthened Confirmed by Delora Fuel ([id number]) on 12/15/2017 3:17:02 AM   Radiology Dg Chest 2 View  Result Date: 12/15/2017 CLINICAL DATA:  Back and flank pain. EXAM: CHEST - 2 VIEW COMPARISON:  Radiographs 10/22/2015, CT 11/12/2015 FINDINGS: The cardiomediastinal contours are unchanged. No pulmonary edema. No consolidation, pleural effusion, or pneumothorax. No acute osseous abnormalities are seen. Degenerative change in the spine. IMPRESSION: No acute pulmonary process. Electronically Signed   By: Jeb Levering M.D.   On: 12/15/2017 02:02   Ct Renal Stone Study  Result Date: 12/15/2017 CLINICAL DATA:  Left flank pain. EXAM: CT ABDOMEN AND PELVIS WITHOUT CONTRAST TECHNIQUE: Multidetector CT imaging of the abdomen and pelvis was performed following the standard protocol without IV contrast. COMPARISON:  None. FINDINGS: Lower chest: Lung bases are clear. Hepatobiliary: Prominent liver spanning 20 cm cranial caudal. No focal lesion on noncontrast exam. Gallbladder physiologically distended, no calcified stone. No biliary dilatation. Pancreas: No ductal dilatation or inflammation. Spleen: Normal in size without focal abnormality. Adrenals/Urinary Tract: Normal adrenal glands. Mild left perinephric edema without hydronephrosis. No urolithiasis. Ureter is decompressed without definite ureteral stone. Unremarkable noncontrast appearance of the right kidney. Urinary bladder is partially distended. No bladder wall thickening or stone. Stomach/Bowel: Stomach is within normal limits. Appendix appears normal. No evidence of bowel wall thickening, distention, or inflammatory changes. Vascular/Lymphatic: Abdominal aorta is normal in caliber. Phleboliths in the ovarian veins and pelvis. No adenopathy. Reproductive: Enlarged fibroid uterus. IUD in place, exact location difficult to discern due to uterine fibroids. No adnexal mass. Other: No free air, free fluid, or intra-abdominal fluid collection. Musculoskeletal: There are no acute or suspicious  osseous abnormalities. Facet arthropathy in the lower lumbar spine. IMPRESSION: 1. Mild left perinephric stranding without hydronephrosis or urolithiasis. Findings Shaw reflect urinary tract infection or recently passed stone. 2. Fibroid uterus with IUD in place. Electronically Signed   By: Jeb Levering M.D.   On: 12/15/2017 02:27    Procedures Procedures (including critical care time)  Medications Ordered in ED Medications  cephALEXin (KEFLEX) capsule 500 mg (  500 mg Oral Given 12/15/17 0318)  ibuprofen (ADVIL,MOTRIN) tablet 600 mg (600 mg Oral Given 12/15/17 0317)     Initial Impression / Assessment and Plan / ED Course  I have reviewed the triage vital signs and the nursing notes.  Pertinent labs & imaging results that were available during my care of the patient were reviewed by me and considered in my medical decision making (see chart for details).  Clinical Course as of Shaw 22 0804  Wed Shaw 22, 2019  0755 Spoke with patient regarding new EKG with long QT syndrome.  She identified herself by birthday.  Discussed not taking the zofran, that she should still take the keflex.  Given her cardiology follow up phone number.  She confirmed my chart access and ability to view AVS, AVS was updated.  She stated her understanding.     [EH]    Clinical Course User Index [EH] Lorin Glass, PA-C   Danielle Shaw presents today for evaluation of left flank pain. CBC and BMP with out acute abnormalities.  Urine with moderate blood, large leukocytes and amorphus crystals presents.  After discussion with patient CT renal scan obtained showing concern for either pyelonephritis or recently passed kidney stone.  Based on large number of leukocytes in urine concern for pyelonephritis.  She did express concern based on family history that this Shaw be related to her heart.  Troponin normal, D-dimer not elevated.  CXR normal.  Patient is low risk heart score.  Given other cause for her pain will  treat for pyelonephritis with keflex.  EKG did show long QTc.  Patient given cardiology follow up.    Patient discharged home.  Return precautions discussed and she states her understanding.    Final Clinical Impressions(s) / ED Diagnoses   Final diagnoses:  Pyelonephritis  Long QT interval  Left flank pain    ED Discharge Orders        Ordered    ondansetron (ZOFRAN ODT) 4 MG disintegrating tablet  Every 4 hours PRN,   Status:  Discontinued     12/15/17 0313    cephALEXin (KEFLEX) 500 MG capsule  2 times daily     12/15/17 0313       Lorin Glass, PA-C 12/15/17 1610    Dina Rich Barbette Hair, MD 12/20/17 [phone]

## 2017-12-15 NOTE — Discharge Instructions (Addendum)
Today your CAT scan showed inflammation surrounding your left kidney.  This may either represent that you have recently passed a kidney stone, or that you have an infection.  Your urine has evidence of both kidney stones and infection.  I sent your urine for a culture which will take a couple days to grow.  I have given you your first dose of antibiotics here today.  We discussed blood testing for heart enzymes, your one here today was normal.  We discussed that this can take multiple hours to be positive even if you are having a heart attack, however given that you have a different cause for your pain found on CT will not repeat.  Your marker for blood clots was not elevated.  Please follow-up with your primary care provider in the next 1 to 2 days regarding your ED visit, and other symptoms.  Please take Ibuprofen (Advil, motrin) and Tylenol (acetaminophen) to relieve your pain.  You may take up to 600 MG (3 pills) of normal strength ibuprofen every 8 hours as needed.  In between doses of ibuprofen you make take tylenol, up to 1,000 mg (two extra strength pills).  Do not take more than 3,000 mg tylenol in a 24 hour period.  Please check all medication labels as many medications such as pain and cold medications may contain tylenol.  Do not drink alcohol while taking these medications.  Do not take other NSAID'S while taking ibuprofen (such as aleve or naproxen).  Please take ibuprofen with food to decrease stomach upset.   You may have diarrhea from the antibiotics.  It is very important that you continue to take the antibiotics even if you get diarrhea unless a medical professional tells you that you may stop taking them.  If you stop too early the bacteria you are being treated for will become stronger and you may need different, more powerful antibiotics that have more side effects and worsening diarrhea.  Please stay well hydrated and consider probiotics as they may decrease the severity of your  diarrhea.  Please be aware that if you take any hormonal contraception (birth control pills, nexplanon, the ring, etc) that your birth control will not work while you are taking antibiotics and you need to use back up protection as directed on the birth control medication information insert.

## 2017-12-16 LAB — URINE CULTURE

## 2017-12-16 NOTE — Addendum Note (Signed)
Addended by: Larey Seat on: 12/16/2017 06:27 PM   Modules accepted: Orders

## 2017-12-16 NOTE — Procedures (Signed)
PATIENT'S NAME:  Danielle Shaw, Danielle Shaw DOB:      May 17, 1971      MR#:    [id number]     DATE OF RECORDING: 12/13/2017 REFERRING M.D.:  Pricilla Holm, M.D. Study Performed:  Split-Night Titration Study HISTORY:  Danielle Shaw is a 47 y.o. female patient who has been told that she snores for many years. She has reached morbid obesity- BMI 58. She reports being unable to sleep longer than 4-5 hours. She is excessively tired in daytime. Her bed partner states that she is snoring, she wakes up choking, or feeling that she cannot get the next breath. She has headaches which are partially attributed to migraine, partially to sinusitis and may also be exacerbated under uncontrolled hypertension.  She did have cold symptoms with coughing, shortness of breath, and congestion- had chills when last seen by PCP. The patient had a blood pressure of 152/110 mmHg and had run out of her anti-hypertensive (Losartan and hydrochlorothiazide).  The patient endorsed the Epworth Sleepiness Scale at 16/ 24 points   The patient's weight 340 pounds with a height of 64 (inches), resulting in a BMI of 58.1 kg/m2. The patient's neck circumference measured 18 inches.  CURRENT MEDICATIONS: Proventil, Aspirin, Flexeril, Vibra, Hydrocodan, Atrovent, Xyzal, Hyzaar, Mobic, Protonix, Crestor, Zocor.   PROCEDURE:  This is a multichannel digital polysomnogram utilizing the Somnostar 11.2 system.  Electrodes and sensors were applied and monitored per AASM Specifications.   EEG, EOG, Chin and Limb EMG, were sampled at 200 Hz.  ECG, Snore and Nasal Pressure, Thermal Airflow, Respiratory Effort, CPAP Flow and Pressure, Oximetry was sampled at 50 Hz. Digital video and audio were recorded.      BASELINE STUDY WITHOUT CPAP RESULTS: Lights Out was at 21:00 and Lights On at 05:00.  Total recording time (TRT) was 220.5, with a total sleep time (TST) of 89.5 minutes.   The patient's sleep latency was 135 minutes.  REM latency was 82 minutes.  The  sleep efficiency was 40.6 %.    SLEEP ARCHITECTURE: WASO (Wake after sleep onset) was 7.5 minutes, Stage N1 was 12 minutes, Stage N2 was 48.5 minutes, Stage N3 was 14 minutes and Stage R (REM sleep) was 15 minutes.  The percentages were Stage N1 13.4%, Stage N2 54.2%, Stage N3 15.6% and Stage R (REM sleep) 16.8%.   RESPIRATORY ANALYSIS:  There were a total of 124 respiratory events:  2 obstructive apneas, 0 central apneas and 122 hypopneas. The patient also had 0 respiratory event related arousals (RERAs).  The total APNEA/HYPOPNEA INDEX (AHI) was 83.1 /hour and the total RESPIRATORY DISTURBANCE INDEX was 83.1 /hour.  21 events occurred in REM sleep and 204 events in NREM. The REM AHI was 84/h, /hour versus a non-REM AHI of 83.0 /hour. The patient spent 0.5 minutes sleep time in the supine position 265 minutes in non-supine. The supine AHI was 0.0 /hour versus a non-supine AHI of 83.1 /hour.  OXYGEN SATURATION & C02:  The wake baseline 02 saturation was 95%, with the lowest being 75%. Time spent below 89% saturation equaled 83 minutes.  AROUSALS : The patient had a total of 0 Periodic Limb Movements.  The arousals were noted as: 6 were spontaneous, 0 were associated with PLMs, and 12 were associated with respiratory events. Audio and video analysis did not show any abnormal or unusual movements, behaviors, phonations or vocalizations. The patient took 2 bathroom breaks. Loud Snoring was noted. EKG was in keeping with normal sinus rhythm (NSR). TITRATION STUDY  WITH CPAP RESULTS: CPAP was initiated at 5 cmH20 with heated humidity per AASM split night standards and pressure was advanced to 12 cmH20 because of hypopneas, apneas and desaturations.  At a PAP pressure of 12 cmH20, there was a reduction of the AHI to 0.0 /hour.   Total recording time (TRT) was 260 minutes, with a total sleep time (TST) of 175.5 minutes. The patient's sleep latency was 19.5 minutes. REM latency was 55 minutes.  The sleep  efficiency was 67.5 %.   Wake after sleep was 67 minutes, Stage N1 21 minutes, Stage N2 55.5 minutes, Stage N3 65 minutes and Stage R (REM sleep) 34 minutes. The percentages were: Stage N1 12.%, Stage N2 31.6%, Stage N3 37.% and Stage R (REM sleep) 19.4%.   RESPIRATORY ANALYSIS:  There were a total of 16 respiratory events: 0 obstructive apneas, 0 central apneas and 0 mixed apneas with a total of 0 apneas and an apnea index (AI) of 0. There were 16 hypopneas with a hypopnea index of 5.5 /hour. The patient also had 0 respiratory event related arousals (RERAs).     The total APNEA/HYPOPNEA INDEX (AHI) was 5.5 /hour and the total RESPIRATORY DISTURBANCE INDEX was 5.5 /hour.   13 events occurred in REM sleep and 3 events in NREM. The REM AHI was 22.9 /hour versus a non-REM AHI of 1.3 /hour. The patient spent her total sleep time in the non-supine position. The supine AHI was 0.0 /hour, versus a non-supine AHI of 5.5/hour.  OXYGEN SATURATION & C02:  The wake baseline 02 saturation was 91%, with the lowest being 84%. Time spent below 89% saturation equaled 7 minutes. PERIODIC LIMB MOVEMENTS: The patient had a total of 0 Periodic Limb Movements. The arousals were noted as: 21 were spontaneous, 0 were associated with PLMs, and 2 were associated with respiratory events.  POLYSOMNOGRAPHY IMPRESSION : Severe Obstructive Sleep Apnea (OSA with AHI of 84/h) responding to CPAP therapy at 12 cm water pressure. REM sleep did not accentuate the apnea. The patient was fitted with a ResMed AirFit P 10 in medium size.   RECOMMENDATIONS:  1) This type and degree of OSA will need to be treated by CPAP. Sleep efficiency improved under CPAP. CPAP auto titration device with heated humidification will be ordered for a setting between 5 and 15 cm water 2) Weight loss plan and sleep hygiene need to be implemented.  A follow up appointment will be scheduled in the Sleep Clinic at [facility].     I certify  that I have reviewed the entire raw data recording prior to the issuance of this report in accordance with the Standards of Accreditation of the American Academy of Sleep Medicine (AASM)  Larey Seat, M.D.    12-15-2017  Diplomat, American Board of Psychiatry and Neurology  Diplomat, [place] [facility] Medical Director, [place] [facility]

## 2017-12-21 ENCOUNTER — Telehealth: Payer: Self-pay | Admitting: Neurology

## 2017-12-21 NOTE — Telephone Encounter (Signed)
-----   Message from Larey Seat, MD sent at 12/16/2017  6:26 PM EDT ----- POLYSOMNOGRAPHY IMPRESSION : Severe Obstructive Sleep Apnea (OSA  with AHI of 84/h) responding to CPAP therapy at 12 cm water  pressure. REM sleep did not accentuate the apnea. The patient was  fitted with a ResMed AirFit P 10 in medium size.  RECOMMENDATIONS:  1) This type and degree of OSA will need to be treated by CPAP.  Sleep efficiency improved under CPAP. CPAP auto titration device  with heated humidification will be ordered for a setting between  5 and 15 cm water2) Weight loss plan and sleep hygiene need to be  implemented. A follow up appointment will be scheduled in the Sleep Clinic at  [facility].

## 2017-12-21 NOTE — Telephone Encounter (Signed)
I called pt. I advised pt that Dr. Brett Fairy reviewed their sleep study results and found that pt has severe sleep apnea. Dr. Brett Fairy recommends that pt starts a auto CPAP. I reviewed PAP compliance expectations with the pt. Pt is agreeable to starting a CPAP. I advised pt that an order will be sent to a DME, [facility], and [facility] will call the pt within about one week after they file with the pt's insurance. [facility] will show the pt how to use the machine, fit for masks, and troubleshoot the CPAP if needed. A follow up appt was made for insurance purposes with Dr. Brett Fairy on Sept 4, 2019 at 1:30 pm. Pt verbalized understanding to arrive 15 minutes early and bring their CPAP. A letter with all of this information in it will be mailed to the pt as a reminder. I verified with the pt that the address we have on file is correct. Pt verbalized understanding of results. Pt had no questions at this time but was encouraged to call back if questions arise.

## 2017-12-22 ENCOUNTER — Ambulatory Visit (INDEPENDENT_AMBULATORY_CARE_PROVIDER_SITE_OTHER): Payer: BLUE CROSS/BLUE SHIELD | Admitting: Family Medicine

## 2017-12-22 VITALS — BP 140/85 | HR 66 | Temp 97.7°F | Ht 65.0 in | Wt 322.0 lb

## 2017-12-22 DIAGNOSIS — R7303 Prediabetes: Secondary | ICD-10-CM

## 2017-12-22 DIAGNOSIS — Z9189 Other specified personal risk factors, not elsewhere classified: Secondary | ICD-10-CM

## 2017-12-22 DIAGNOSIS — Z6841 Body Mass Index (BMI) 40.0 and over, adult: Secondary | ICD-10-CM

## 2017-12-22 DIAGNOSIS — E559 Vitamin D deficiency, unspecified: Secondary | ICD-10-CM

## 2017-12-22 MED ORDER — VITAMIN D (ERGOCALCIFEROL) 1.25 MG (50000 UNIT) PO CAPS: 50000.0000 [IU] | ORAL_CAPSULE | ORAL | 0 refills | Status: DC

## 2017-12-22 MED ORDER — METFORMIN HCL 500 MG PO TABS: 500.0000 mg | ORAL_TABLET | Freq: Every day | ORAL | 0 refills | Status: DC

## 2017-12-22 NOTE — Progress Notes (Signed)
Office: [phone]  /  Fax: [phone]   HPI:   Chief Complaint: OBESITY Danielle Shaw is here to discuss her progress with her obesity treatment plan. She is on the Category 2 plan + 100 calories if needed and is following her eating plan approximately 95 % of the time. She states she is exercising 0 minutes 0 times per week. Danielle Shaw is occasionally hungry between breakfast and lunch but would eat a snack.  Her weight is (!) 322 lb (146.1 kg) today and has had a weight loss of 13 pounds over a period of 2 weeks since her last visit. She has lost 13 lbs since starting treatment with [place].  Vitamin D Deficiency Danielle Shaw has a diagnosis of vitamin D deficiency. She is not on OTC Vit D supplementation and denies nausea, vomiting or muscle weakness.  Pre-Diabetes Danielle Shaw has a diagnosis of pre-diabetes based on her elevated Hgb A1c and was informed this puts her at greater risk of developing diabetes. She notes occasional sweet cravings. Diagnosed for at least the past 2 years. She is not taking metformin currently and continues to work on diet and exercise to decrease risk of diabetes. She denies nausea or hypoglycemia.  At risk for diabetes Danielle Shaw is at higher than average risk for developing diabetes due to her obesity and pre-diabetes. She currently denies polyuria or polydipsia.  ALLERGIES: Allergies  Allergen Reactions  . Latex   . Lisinopril Cough  . Metoprolol Other (See Comments)    Funny feeling    MEDICATIONS: Current Outpatient Medications on File Prior to Visit  Medication Sig Dispense Refill  . albuterol (PROVENTIL HFA;VENTOLIN HFA) 108 (90 Base) MCG/ACT inhaler INHALE TWO PUFFS BY MOUTH EVERY 6 HOURS AS NEEDED FOR WHEEZING OR SHORTNESS OF BREATH 18 each 1  . aspirin 81 MG tablet Take 81 mg by mouth daily.     . cephALEXin (KEFLEX) 500 MG capsule Take 1 capsule (500 mg total) by mouth 2 (two) times daily for 14 days. 28 capsule 0  . cyclobenzaprine (FLEXERIL) 5 MG tablet Take 1  tablet (5 mg total) by mouth 3 (three) times daily as needed for muscle spasms. 30 tablet 1  . HYDROcodone-homatropine (HYCODAN) 5-1.5 MG/5ML syrup Take 5-10 mLs by mouth at bedtime as needed for cough. 50 mL 0  . ipratropium (ATROVENT) 0.03 % nasal spray Place 2 sprays into both nostrils every 12 (twelve) hours. 30 mL 12  . levocetirizine (XYZAL) 5 MG tablet Take 5 mg by mouth every evening.    [name] losartan-hydrochlorothiazide (HYZAAR) 50-12.5 MG tablet Take 1 tablet by mouth daily. 90 tablet 0  . meloxicam (MOBIC) 15 MG tablet Take 1 tablet (15 mg total) by mouth daily as needed for pain. 30 tablet 1  . modafinil (PROVIGIL) 100 MG tablet Use when sleepy at work or driving , do not use within 4 hours of intended bedtime. 30 tablet 0  . pantoprazole (PROTONIX) 40 MG tablet Take 1 tablet (40 mg total) by mouth daily. 30 tablet 3  . rosuvastatin (CRESTOR) 20 MG tablet Take 1 tablet (20 mg total) by mouth daily. 90 tablet 3   No current facility-administered medications on file prior to visit.     PAST MEDICAL HISTORY: Past Medical History:  Diagnosis Date  . Arrhythmia   . Arthritis   . GERD (gastroesophageal reflux disease)   . Hyperlipidemia   . Hypertension   . IUD 2004  . IUD 2009  . Migraine   . SOB (shortness of breath)  PAST SURGICAL HISTORY: Past Surgical History:  Procedure Laterality Date  . BREAST BIOPSY Right 2011  . BREAST SURGERY  2000   reduction  . CESAREAN SECTION      SOCIAL HISTORY: Social History   Tobacco Use  . Smoking status: Never Smoker  . Smokeless tobacco: Never Used  Substance Use Topics  . Alcohol use: No    Alcohol/week: 0.0 oz  . Drug use: No    FAMILY HISTORY: Family History  Problem Relation Age of Onset  . Heart disease Mother   . High Cholesterol Mother   . High blood pressure Mother   . Diabetes Father   . Heart disease Father   . High blood pressure Father   . High Cholesterol Father   . Heart disease Maternal Grandmother     . Hodgkin's lymphoma Daughter     ROS: Review of Systems  Constitutional: Positive for weight loss.  Gastrointestinal: Negative for nausea and vomiting.  Genitourinary: Negative for frequency.  Musculoskeletal:       Negative muscle weakness  Endo/Heme/Allergies: Negative for polydipsia.    PHYSICAL EXAM: Blood pressure 140/85, pulse 66, temperature 97.7 F (36.5 C), temperature source Oral, height 5\' 5"  (1.651 m), weight (!) 322 lb (146.1 kg), SpO2 98 %. Body mass index is 53.58 kg/m. Physical Exam  Constitutional: She is oriented to person, place, and time. She appears well-developed and well-nourished.  Cardiovascular: Normal rate.  Pulmonary/Chest: Effort normal.  Musculoskeletal: Normal range of motion.  Neurological: She is oriented to person, place, and time.  Skin: Skin is warm and dry.  Psychiatric: She has a normal mood and affect. Her behavior is normal.  Vitals reviewed.   RECENT LABS AND TESTS: BMET    Component Value Date/Time   NA 137 12/14/2017 2303   NA 139 12/08/2017 1201   K 3.5 12/14/2017 2303   CL 101 12/14/2017 2303   CO2 26 12/14/2017 2303   GLUCOSE 101 (H) 12/14/2017 2303   BUN 13 12/14/2017 2303   BUN 10 12/08/2017 1201   CREATININE 0.78 12/14/2017 2303   CREATININE 0.64 02/01/2014 1005   CALCIUM 9.4 12/14/2017 2303   GFRNONAA >60 12/14/2017 2303   GFRAA >60 12/14/2017 2303   Lab Results  Component Value Date   HGBA1C 6.2 (H) 12/08/2017   HGBA1C 6.4 12/24/2016   HGBA1C 6.0 01/21/2016   HGBA1C 6.0 05/03/2015   Lab Results  Component Value Date   INSULIN 20.9 12/08/2017   CBC    Component Value Date/Time   WBC 5.2 12/14/2017 2303   RBC 4.65 12/14/2017 2303   HGB 13.7 12/14/2017 2303   HGB 12.9 12/08/2017 1201   HCT 40.4 12/14/2017 2303   HCT 39.5 12/08/2017 1201   PLT 379 12/14/2017 2303   MCV 86.9 12/14/2017 2303   MCV 89 12/08/2017 1201   MCH 29.5 12/14/2017 2303   MCHC 33.9 12/14/2017 2303   RDW 13.7 12/14/2017 2303    RDW 15.0 12/08/2017 1201   LYMPHSABS 2.0 12/08/2017 1201   MONOABS 0.3 02/01/2014 1005   EOSABS 0.4 12/08/2017 1201   BASOSABS 0.0 12/08/2017 1201   Iron/TIBC/Ferritin/ %Sat No results found for: IRON, TIBC, FERRITIN, IRONPCTSAT Lipid Panel     Component Value Date/Time   CHOL 214 (H) 12/08/2017 1201   TRIG 143 12/08/2017 1201   HDL 41 12/08/2017 1201   CHOLHDL 7 12/24/2016 1142   VLDL 27.4 12/24/2016 1142   LDLCALC 144 (H) 12/08/2017 1201   LDLDIRECT 177 (H) 10/28/2010 7353  Hepatic Function Panel     Component Value Date/Time   PROT 7.4 12/08/2017 1201   ALBUMIN 4.2 12/08/2017 1201   AST 18 12/08/2017 1201   ALT 11 12/08/2017 1201   ALKPHOS 114 12/08/2017 1201   BILITOT 0.4 12/08/2017 1201      Component Value Date/Time   TSH 1.310 12/08/2017 1201   TSH 1.125 02/01/2014 1005   TSH 1.310 05/06/2011 1131    ASSESSMENT AND PLAN: Vitamin D deficiency - Plan: Vitamin D, Ergocalciferol, (DRISDOL) 50000 units CAPS capsule  Prediabetes - Plan: metFORMIN (GLUCOPHAGE) 500 MG tablet  At risk for diabetes mellitus  Class 3 severe obesity with serious comorbidity and body mass index (BMI) of 50.0 to 59.9 in adult, unspecified obesity type ([place])  PLAN:  Vitamin D Deficiency Danielle Shaw was informed that low vitamin D levels contributes to fatigue and are associated with obesity, breast, and colon cancer. Danylah agrees to start prescription Vit D @50 ,000 IU every week #4 with no refills. She will follow up for routine testing of vitamin D, at least 2-3 times per year. She was informed of the risk of over-replacement of vitamin D and agrees to not increase her dose unless she discusses this with [place] first. We will retest in 3 months and Danielle Shaw agrees to follow up with our clinic in 2 to 3 weeks.  Pre-Diabetes Danielle Shaw will continue to work on weight loss, exercise, and decreasing simple carbohydrates in her diet to help decrease the risk of diabetes. We dicussed metformin including  benefits and risks. She was informed that eating too many simple carbohydrates or too many calories at one sitting increases the likelihood of GI side effects. Danielle Shaw agrees to start. Danielle Shaw agreed to follow up with [place] as directed to monitor her progress.  Diabetes risk counselling Danielle Shaw was given extended (30 minutes) diabetes prevention counseling today. She is 47 y.o. female and has risk factors for diabetes including obesity and pre-diabetes. We discussed intensive lifestyle modifications today with an emphasis on weight loss as well as increasing exercise and decreasing simple carbohydrates in her diet.  Obesity Danielle Shaw is currently in the action stage of change. As such, her goal is to continue with weight loss efforts She has agreed to follow the Category 2 plan Danielle Shaw has been instructed to work up to a goal of 150 minutes of combined cardio and strengthening exercise per week for weight loss and overall health benefits. We discussed the following Behavioral Modification Strategies today: increasing lean protein intake, increasing vegetables, work on meal planning and easy cooking plans, better snacking choices, and planning for success   Danielle Shaw has agreed to follow up with our clinic in 2 to 3 weeks. She was informed of the importance of frequent follow up visits to maximize her success with intensive lifestyle modifications for her multiple health conditions.   OBESITY BEHAVIORAL INTERVENTION VISIT  Today's visit was # 2 out of 22.  Starting weight: 335 lbs Starting date: 12/08/17 Today's weight : 322 lbs  Today's date: 12/22/2017 Total lbs lost to date: 13 (Patients must lose 7 lbs in the first 6 months to continue with counseling)   ASK: We discussed the diagnosis of obesity with Danielle Shaw today and Danielle Shaw agreed to give [place] permission to discuss obesity behavioral modification therapy today.  ASSESS: Danielle Shaw has the diagnosis of obesity and her BMI today is 53.58 Danielle Shaw is in  the action stage of change   ADVISE: Danielle Shaw was educated on the multiple health risks of  obesity as well as the benefit of weight loss to improve her health. She was advised of the need for long term treatment and the importance of lifestyle modifications.  AGREE: Multiple dietary modification options and treatment options were discussed and  Danielle Shaw agreed to the above obesity treatment plan.  I, Trixie Dredge, am acting as transcriptionist for Ilene Qua, MD  I have reviewed the above documentation for accuracy and completeness, and I agree with the above. - Ilene Qua, MD

## 2018-01-01 ENCOUNTER — Encounter (INDEPENDENT_AMBULATORY_CARE_PROVIDER_SITE_OTHER): Payer: Self-pay | Admitting: Family Medicine

## 2018-01-03 DIAGNOSIS — G4733 Obstructive sleep apnea (adult) (pediatric): Secondary | ICD-10-CM | POA: Diagnosis not present

## 2018-01-05 ENCOUNTER — Ambulatory Visit (INDEPENDENT_AMBULATORY_CARE_PROVIDER_SITE_OTHER): Payer: BLUE CROSS/BLUE SHIELD | Admitting: Internal Medicine

## 2018-01-05 ENCOUNTER — Encounter: Payer: Self-pay | Admitting: Internal Medicine

## 2018-01-05 VITALS — BP 136/90 | HR 68 | Temp 98.0°F | Ht 65.0 in | Wt 322.0 lb

## 2018-01-05 DIAGNOSIS — R109 Unspecified abdominal pain: Secondary | ICD-10-CM | POA: Diagnosis not present

## 2018-01-05 DIAGNOSIS — Z23 Encounter for immunization: Secondary | ICD-10-CM

## 2018-01-05 DIAGNOSIS — G5602 Carpal tunnel syndrome, left upper limb: Secondary | ICD-10-CM

## 2018-01-05 LAB — POCT URINALYSIS DIPSTICK
BILIRUBIN UA: NEGATIVE
Blood, UA: NEGATIVE
GLUCOSE UA: NEGATIVE
KETONES UA: NEGATIVE
Leukocytes, UA: NEGATIVE
NITRITE UA: NEGATIVE
Protein, UA: NEGATIVE
Spec Grav, UA: 1.02 (ref 1.010–1.025)
Urobilinogen, UA: 0.2 E.U./dL
pH, UA: 6 (ref 5.0–8.0)

## 2018-01-05 MED ORDER — PANTOPRAZOLE SODIUM 40 MG PO TBEC: 40.0000 mg | DELAYED_RELEASE_TABLET | Freq: Every day | ORAL | 3 refills | Status: DC

## 2018-01-05 MED ORDER — LOSARTAN POTASSIUM-HCTZ 50-12.5 MG PO TABS: 1.0000 | ORAL_TABLET | Freq: Every day | ORAL | 3 refills | Status: DC

## 2018-01-05 MED ORDER — ALBUTEROL SULFATE HFA 108 (90 BASE) MCG/ACT IN AERS: INHALATION_SPRAY | RESPIRATORY_TRACT | 1 refills | Status: DC

## 2018-01-05 MED ORDER — TRAMADOL HCL 50 MG PO TABS: 50.0000 mg | ORAL_TABLET | Freq: Three times a day (TID) | ORAL | 0 refills | Status: DC | PRN

## 2018-01-05 MED ORDER — IPRATROPIUM BROMIDE 0.03 % NA SOLN: 2.0000 | Freq: Two times a day (BID) | NASAL | 12 refills | Status: DC

## 2018-01-05 MED ORDER — ROSUVASTATIN CALCIUM 20 MG PO TABS: 20.0000 mg | ORAL_TABLET | Freq: Every day | ORAL | 3 refills | Status: DC

## 2018-01-05 NOTE — Patient Instructions (Addendum)
We have sent in the refills today for you.   We have sent in tramadol to help with pain.   We are getting an ultrasound of the kidneys to see why you are still having pain.  We will get you in with the hand specialist to evaluate the carpal tunnel.   Health Maintenance, Female Adopting a healthy lifestyle and getting preventive care can go a long way to promote health and wellness. Talk with your health care provider about what schedule of regular examinations is right for you. This is a good chance for you to check in with your provider about disease prevention and staying healthy. In between checkups, there are plenty of things you can do on your own. Experts have done a lot of research about which lifestyle changes and preventive measures are most likely to keep you healthy. Ask your health care provider for more information. Weight and diet Eat a healthy diet  Be sure to include plenty of vegetables, fruits, low-fat dairy products, and lean protein.  Do not eat a lot of foods high in solid fats, added sugars, or salt.  Get regular exercise. This is one of the most important things you can do for your health. ? Most adults should exercise for at least 150 minutes each week. The exercise should increase your heart rate and make you sweat (moderate-intensity exercise). ? Most adults should also do strengthening exercises at least twice a week. This is in addition to the moderate-intensity exercise.  Maintain a healthy weight  Body mass index (BMI) is a measurement that can be used to identify possible weight problems. It estimates body fat based on height and weight. Your health care provider can help determine your BMI and help you achieve or maintain a healthy weight.  For females 43 years of age and older: ? A BMI below 18.5 is considered underweight. ? A BMI of 18.5 to 24.9 is normal. ? A BMI of 25 to 29.9 is considered overweight. ? A BMI of 30 and above is considered  obese.  Watch levels of cholesterol and blood lipids  You should start having your blood tested for lipids and cholesterol at 45 years of age, then have this test every 5 years.  You may need to have your cholesterol levels checked more often if: ? Your lipid or cholesterol levels are high. ? You are older than 45 years of age. ? You are at high risk for heart disease.  Cancer screening Lung Cancer  Lung cancer screening is recommended for adults 54-63 years old who are at high risk for lung cancer because of a history of smoking.  A yearly low-dose CT scan of the lungs is recommended for people who: ? Currently smoke. ? Have quit within the past 15 years. ? Have at least a 30-pack-year history of smoking. A pack year is smoking an average of one pack of cigarettes a day for 1 year.  Yearly screening should continue until it has been 15 years since you quit.  Yearly screening should stop if you develop a health problem that would prevent you from having lung cancer treatment.  Breast Cancer  Practice breast self-awareness. This means understanding how your breasts normally appear and feel.  It also means doing regular breast self-exams. Let your health care provider know about any changes, no matter how small.  If you are in your 20s or 30s, you should have a clinical breast exam (CBE) by a health care provider every 1-3  years as part of a regular health exam.  If you are 27 or older, have a CBE every year. Also consider having a breast X-ray (mammogram) every year.  If you have a family history of breast cancer, talk to your health care provider about genetic screening.  If you are at high risk for breast cancer, talk to your health care provider about having an MRI and a mammogram every year.  Breast cancer gene (BRCA) assessment is recommended for women who have family members with BRCA-related cancers. BRCA-related cancers  include: ? Breast. ? Ovarian. ? Tubal. ? Peritoneal cancers.  Results of the assessment will determine the need for genetic counseling and BRCA1 and BRCA2 testing.  Cervical Cancer Your health care provider may recommend that you be screened regularly for cancer of the pelvic organs (ovaries, uterus, and vagina). This screening involves a pelvic examination, including checking for microscopic changes to the surface of your cervix (Pap test). You may be encouraged to have this screening done every 3 years, beginning at age 57.  For women ages 42-65, health care providers may recommend pelvic exams and Pap testing every 3 years, or they may recommend the Pap and pelvic exam, combined with testing for human papilloma virus (HPV), every 5 years. Some types of HPV increase your risk of cervical cancer. Testing for HPV may also be done on women of any age with unclear Pap test results.  Other health care providers may not recommend any screening for nonpregnant women who are considered low risk for pelvic cancer and who do not have symptoms. Ask your health care provider if a screening pelvic exam is right for you.  If you have had past treatment for cervical cancer or a condition that could lead to cancer, you need Pap tests and screening for cancer for at least 20 years after your treatment. If Pap tests have been discontinued, your risk factors (such as having a new sexual partner) need to be reassessed to determine if screening should resume. Some women have medical problems that increase the chance of getting cervical cancer. In these cases, your health care provider may recommend more frequent screening and Pap tests.  Colorectal Cancer  This type of cancer can be detected and often prevented.  Routine colorectal cancer screening usually begins at 45 years of age and continues through 45 years of age.  Your health care provider may recommend screening at an earlier age if you have risk factors  for colon cancer.  Your health care provider may also recommend using home test kits to check for hidden blood in the stool.  A small camera at the end of a tube can be used to examine your colon directly (sigmoidoscopy or colonoscopy). This is done to check for the earliest forms of colorectal cancer.  Routine screening usually begins at age 81.  Direct examination of the colon should be repeated every 5-10 years through 45 years of age. However, you may need to be screened more often if early forms of precancerous polyps or small growths are found.  Skin Cancer  Check your skin from head to toe regularly.  Tell your health care provider about any new moles or changes in moles, especially if there is a change in a mole's shape or color.  Also tell your health care provider if you have a mole that is larger than the size of a pencil eraser.  Always use sunscreen. Apply sunscreen liberally and repeatedly throughout the day.  Protect yourself  by wearing long sleeves, pants, a wide-brimmed hat, and sunglasses whenever you are outside.  Heart disease, diabetes, and high blood pressure  High blood pressure causes heart disease and increases the risk of stroke. High blood pressure is more likely to develop in: ? People who have blood pressure in the high end of the normal range (130-139/85-89 mm Hg). ? People who are overweight or obese. ? People who are African American.  If you are 18-39 years of age, have your blood pressure checked every 3-5 years. If you are 40 years of age or older, have your blood pressure checked every year. You should have your blood pressure measured twice-once when you are at a hospital or clinic, and once when you are not at a hospital or clinic. Record the average of the two measurements. To check your blood pressure when you are not at a hospital or clinic, you can use: ? An automated blood pressure machine at a pharmacy. ? A home blood pressure monitor.  If  you are between 55 years and 79 years old, ask your health care provider if you should take aspirin to prevent strokes.  Have regular diabetes screenings. This involves taking a blood sample to check your fasting blood sugar level. ? If you are at a normal weight and have a low risk for diabetes, have this test once every three years after 45 years of age. ? If you are overweight and have a high risk for diabetes, consider being tested at a younger age or more often. Preventing infection Hepatitis B  If you have a higher risk for hepatitis B, you should be screened for this virus. You are considered at high risk for hepatitis B if: ? You were born in a country where hepatitis B is common. Ask your health care provider which countries are considered high risk. ? Your parents were born in a high-risk country, and you have not been immunized against hepatitis B (hepatitis B vaccine). ? You have HIV or AIDS. ? You use needles to inject street drugs. ? You live with someone who has hepatitis B. ? You have had sex with someone who has hepatitis B. ? You get hemodialysis treatment. ? You take certain medicines for conditions, including cancer, organ transplantation, and autoimmune conditions.  Hepatitis C  Blood testing is recommended for: ? Everyone born from 1945 through 1965. ? Anyone with known risk factors for hepatitis C.  Sexually transmitted infections (STIs)  You should be screened for sexually transmitted infections (STIs) including gonorrhea and chlamydia if: ? You are sexually active and are younger than 45 years of age. ? You are older than 45 years of age and your health care provider tells you that you are at risk for this type of infection. ? Your sexual activity has changed since you were last screened and you are at an increased risk for chlamydia or gonorrhea. Ask your health care provider if you are at risk.  If you do not have HIV, but are at risk, it may be recommended  that you take a prescription medicine daily to prevent HIV infection. This is called pre-exposure prophylaxis (PrEP). You are considered at risk if: ? You are sexually active and do not regularly use condoms or know the HIV status of your partner(s). ? You take drugs by injection. ? You are sexually active with a partner who has HIV.  Talk with your health care provider about whether you are at high risk of being infected   with HIV. If you choose to begin PrEP, you should first be tested for HIV. You should then be tested every 3 months for as long as you are taking PrEP. Pregnancy  If you are premenopausal and you may become pregnant, ask your health care provider about preconception counseling.  If you may become pregnant, take 400 to 800 micrograms (mcg) of folic acid every day.  If you want to prevent pregnancy, talk to your health care provider about birth control (contraception). Osteoporosis and menopause  Osteoporosis is a disease in which the bones lose minerals and strength with aging. This can result in serious bone fractures. Your risk for osteoporosis can be identified using a bone density scan.  If you are 65 years of age or older, or if you are at risk for osteoporosis and fractures, ask your health care provider if you should be screened.  Ask your health care provider whether you should take a calcium or vitamin D supplement to lower your risk for osteoporosis.  Menopause may have certain physical symptoms and risks.  Hormone replacement therapy may reduce some of these symptoms and risks. Talk to your health care provider about whether hormone replacement therapy is right for you. Follow these instructions at home:  Schedule regular health, dental, and eye exams.  Stay current with your immunizations.  Do not use any tobacco products including cigarettes, chewing tobacco, or electronic cigarettes.  If you are pregnant, do not drink alcohol.  If you are  breastfeeding, limit how much and how often you drink alcohol.  Limit alcohol intake to no more than 1 drink per day for nonpregnant women. One drink equals 12 ounces of beer, 5 ounces of wine, or 1 ounces of hard liquor.  Do not use street drugs.  Do not share needles.  Ask your health care provider for help if you need support or information about quitting drugs.  Tell your health care provider if you often feel depressed.  Tell your health care provider if you have ever been abused or do not feel safe at home. This information is not intended to replace advice given to you by your health care provider. Make sure you discuss any questions you have with your health care provider. Document Released: 01/26/2011 Document Revised: 12/19/2015 Document Reviewed: 04/16/2015 [employer] Interactive Patient Education  2018 [employer]  

## 2018-01-05 NOTE — Progress Notes (Signed)
   Subjective:    Patient ID: Danielle Shaw, female    DOB: 1971-01-04, 47 y.o.   MRN: [id number]  HPI The patient is a 47 YO female coming in for physical but she has many concerns which are more important. She is having ER visit for possible kidney infection versus kidney stone. She had CT which was not conclusive if she had passed a stone or pyelonephritis. She has finished the antibiotics she was given and is still having 10/10 pain in the left flank. She denies having any medicine for pain. She has taken tylenol and advil otc. She is taking a lot of advil for the pain but not getting any relief. She denies fevers or chills. Denies pain on urination. No blood in urine.  She is also having ongoing problem of capral tunnel. This is the first time she has mentioned it. She has had this for several years. It is worsened in the last several months. 3 fingers are numb on the left hand most of the time. Denies weakness in the hand. Pain in both hands, especially with more usage of them.   Review of Systems  Constitutional: Negative.   HENT: Negative.   Eyes: Negative.   Respiratory: Negative for cough, chest tightness and shortness of breath.   Cardiovascular: Negative for chest pain, palpitations and leg swelling.  Gastrointestinal: Positive for abdominal pain. Negative for abdominal distention, constipation, diarrhea, nausea and vomiting.  Genitourinary: Positive for flank pain. Negative for difficulty urinating, dysuria, enuresis, hematuria, pelvic pain and urgency.  Musculoskeletal: Positive for arthralgias and myalgias. Negative for gait problem and joint swelling.  Skin: Negative.   Neurological: Positive for numbness. Negative for dizziness, facial asymmetry, weakness and headaches.  Psychiatric/Behavioral: Negative.       Objective:   Physical Exam  Constitutional: She is oriented to person, place, and time. She appears well-developed and well-nourished.  HENT:  Head: Normocephalic  and atraumatic.  Eyes: EOM are normal.  Neck: Normal range of motion.  Cardiovascular: Normal rate and regular rhythm.  Pulmonary/Chest: Effort normal and breath sounds normal. No respiratory distress. She has no wheezes. She has no rales.  Abdominal: Soft. She exhibits no distension. There is tenderness. There is no rebound.  Musculoskeletal: She exhibits tenderness. She exhibits no edema.  tinnel positive, left 1-3rd fingers numb  Neurological: She is alert and oriented to person, place, and time. Coordination normal.  Skin: Skin is warm and dry.   Vitals:   01/05/18 0803  BP: 136/90  Pulse: 68  Temp: 98 F (36.7 C)  TempSrc: Oral  SpO2: 96%  Weight: (!) 322 lb (146.1 kg)  Height: 5\' 5"  (1.651 m)      Assessment & Plan:  Tdap given at visit

## 2018-01-07 DIAGNOSIS — R109 Unspecified abdominal pain: Secondary | ICD-10-CM | POA: Insufficient documentation

## 2018-01-07 DIAGNOSIS — G5602 Carpal tunnel syndrome, left upper limb: Secondary | ICD-10-CM | POA: Insufficient documentation

## 2018-01-07 NOTE — Assessment & Plan Note (Signed)
US renal to check for kidney stones. Rx for tramadol for pain. U/A done in the office not consistent with infection so this is resolved since ER.

## 2018-01-07 NOTE — Assessment & Plan Note (Signed)
Referral to hand surgery. Having worsening symptoms.

## 2018-01-10 ENCOUNTER — Ambulatory Visit (INDEPENDENT_AMBULATORY_CARE_PROVIDER_SITE_OTHER): Payer: BLUE CROSS/BLUE SHIELD | Admitting: Family Medicine

## 2018-01-10 VITALS — BP 135/85 | HR 60 | Temp 97.8°F | Ht 65.0 in | Wt 318.0 lb

## 2018-01-10 DIAGNOSIS — G8929 Other chronic pain: Secondary | ICD-10-CM

## 2018-01-10 DIAGNOSIS — R7303 Prediabetes: Secondary | ICD-10-CM

## 2018-01-10 DIAGNOSIS — E559 Vitamin D deficiency, unspecified: Secondary | ICD-10-CM | POA: Diagnosis not present

## 2018-01-10 DIAGNOSIS — M546 Pain in thoracic spine: Secondary | ICD-10-CM

## 2018-01-10 DIAGNOSIS — Z6841 Body Mass Index (BMI) 40.0 and over, adult: Secondary | ICD-10-CM

## 2018-01-11 ENCOUNTER — Encounter: Payer: Self-pay | Admitting: Physician Assistant

## 2018-01-11 ENCOUNTER — Ambulatory Visit: Payer: BLUE CROSS/BLUE SHIELD | Admitting: Physician Assistant

## 2018-01-11 VITALS — BP 138/86 | HR 84 | Ht 65.0 in | Wt 325.0 lb

## 2018-01-11 DIAGNOSIS — R079 Chest pain, unspecified: Secondary | ICD-10-CM | POA: Diagnosis not present

## 2018-01-11 DIAGNOSIS — I10 Essential (primary) hypertension: Secondary | ICD-10-CM | POA: Diagnosis not present

## 2018-01-11 DIAGNOSIS — R7303 Prediabetes: Secondary | ICD-10-CM | POA: Diagnosis not present

## 2018-01-11 DIAGNOSIS — E785 Hyperlipidemia, unspecified: Secondary | ICD-10-CM | POA: Diagnosis not present

## 2018-01-11 DIAGNOSIS — R002 Palpitations: Secondary | ICD-10-CM | POA: Diagnosis not present

## 2018-01-11 NOTE — Patient Instructions (Signed)
Medication Instructions: Your physician recommends that you continue on your current medications as directed.    If you need a refill on your cardiac medications before your next appointment, please call your pharmacy.   Procedures/Testing: Your physician has recommended that you wear an event monitor for 30 days. Event monitors are medical devices that record the heart's electrical activity. Doctors most often [place] these monitors to diagnose arrhythmias. Arrhythmias are problems with the speed or rhythm of the heartbeat. The monitor is a small, portable device. You can wear one while you do your normal daily activities. This is usually used to diagnose what is causing palpitations/syncope (passing out). [place] has requested that you have an exercise tolerance test. For further information please visit [email or web address]. Please also follow instruction sheet, as given. Kenneth: Your physician wants you to follow-up in 3 months with Dr. Harrington Challenger.   Special Instructions:    Thank you for choosing [facility]!!

## 2018-01-11 NOTE — Progress Notes (Signed)
Office: [phone]  /  Fax: [phone]   HPI:   Chief Complaint: OBESITY Keonta is here to discuss her progress with her obesity treatment plan. She is on the Category 2 plan and is following her eating plan approximately 90 % of the time. She states she is exercising 0 minutes 0 times per week. Emanie finds that she still needs to force herself to eat all the food. She denies any cravings. Her weight is (!) 318 lb (144.2 kg) today and has had a weight loss of 4 pounds over a period of 2 weeks since her last visit. She has lost 17 lbs since starting treatment with [place].  Vitamin D deficiency Selam has a diagnosis of vitamin D deficiency. She is currently taking vit D and denies nausea, vomiting or muscle weakness.  Back Pain (Left sided thoracic) CT stone study 12/15/17 showing perinephric stranding, but no stone. Urinalysis on 6/14 at PCP was not consistent with UTI.  Pre-Diabetes Annie has a diagnosis of prediabetes based on her elevated Hgb A1c and was informed this puts her at greater risk of developing diabetes. She denies any GI upset with metformin. She continues to work on diet and exercise to decrease risk of diabetes. She denies any cravings, nausea or hypoglycemia.  ALLERGIES: Allergies  Allergen Reactions  . Latex   . Lisinopril Cough  . Metoprolol Other (See Comments)    Funny feeling    MEDICATIONS: Current Outpatient Medications on File Prior to Visit  Medication Sig Dispense Refill  . albuterol (PROVENTIL HFA;VENTOLIN HFA) 108 (90 Base) MCG/ACT inhaler INHALE TWO PUFFS BY MOUTH EVERY 6 HOURS AS NEEDED FOR WHEEZING OR SHORTNESS OF BREATH 18 each 1  . aspirin 81 MG tablet Take 81 mg by mouth daily.     . cyclobenzaprine (FLEXERIL) 5 MG tablet Take 1 tablet (5 mg total) by mouth 3 (three) times daily as needed for muscle spasms. 30 tablet 1  . HYDROcodone-homatropine (HYCODAN) 5-1.5 MG/5ML syrup Take 5-10 mLs by mouth at bedtime as needed for cough. 50 mL 0  .  ipratropium (ATROVENT) 0.03 % nasal spray Place 2 sprays into both nostrils every 12 (twelve) hours. 30 mL 12  . levocetirizine (XYZAL) 5 MG tablet Take 5 mg by mouth every evening.    [name] losartan-hydrochlorothiazide (HYZAAR) 50-12.5 MG tablet Take 1 tablet by mouth daily. 90 tablet 3  . meloxicam (MOBIC) 15 MG tablet Take 1 tablet (15 mg total) by mouth daily as needed for pain. 30 tablet 1  . metFORMIN (GLUCOPHAGE) 500 MG tablet Take 1 tablet (500 mg total) by mouth daily with breakfast. 30 tablet 0  . modafinil (PROVIGIL) 100 MG tablet Use when sleepy at work or driving , do not use within 4 hours of intended bedtime. 30 tablet 0  . pantoprazole (PROTONIX) 40 MG tablet Take 1 tablet (40 mg total) by mouth daily. 90 tablet 3  . rosuvastatin (CRESTOR) 20 MG tablet Take 1 tablet (20 mg total) by mouth daily. 90 tablet 3  . traMADol (ULTRAM) 50 MG tablet Take 1 tablet (50 mg total) by mouth every 8 (eight) hours as needed. 30 tablet 0  . Vitamin D, Ergocalciferol, (DRISDOL) 50000 units CAPS capsule Take 1 capsule (50,000 Units total) by mouth every 7 (seven) days. 4 capsule 0   No current facility-administered medications on file prior to visit.     PAST MEDICAL HISTORY: Past Medical History:  Diagnosis Date  . Arrhythmia   . Arthritis   . GERD (  gastroesophageal reflux disease)   . Hyperlipidemia   . Hypertension   . IUD 2004  . IUD 2009  . Migraine   . SOB (shortness of breath)     PAST SURGICAL HISTORY: Past Surgical History:  Procedure Laterality Date  . BREAST BIOPSY Right 2011  . BREAST SURGERY  2000   reduction  . CESAREAN SECTION      SOCIAL HISTORY: Social History   Tobacco Use  . Smoking status: Never Smoker  . Smokeless tobacco: Never Used  Substance Use Topics  . Alcohol use: No    Alcohol/week: 0.0 oz  . Drug use: No    FAMILY HISTORY: Family History  Problem Relation Age of Onset  . Heart disease Mother   . High Cholesterol Mother   . High blood  pressure Mother   . Diabetes Father   . Heart disease Father   . High blood pressure Father   . High Cholesterol Father   . Heart disease Maternal Grandmother   . Hodgkin's lymphoma Daughter     ROS: Review of Systems  Constitutional: Positive for weight loss.  Gastrointestinal: Negative for nausea and vomiting.  Musculoskeletal: Positive for back pain.       Negative for muscle weakness  Endo/Heme/Allergies:       Negative for cravings Negative for hypoglycemia    PHYSICAL EXAM: Blood pressure 135/85, pulse 60, temperature 97.8 F (36.6 C), temperature source Oral, height 5\' 5"  (1.651 m), weight (!) 318 lb (144.2 kg), SpO2 96 %. Body mass index is 52.92 kg/m. Physical Exam  Constitutional: She is oriented to person, place, and time. She appears well-developed and well-nourished.  Cardiovascular: Normal rate.  Pulmonary/Chest: Effort normal.  Musculoskeletal: Normal range of motion.  Neurological: She is oriented to person, place, and time.  Skin: Skin is warm and dry.  Psychiatric: She has a normal mood and affect. Her behavior is normal.  Vitals reviewed.   RECENT LABS AND TESTS: BMET    Component Value Date/Time   NA 137 12/14/2017 2303   NA 139 12/08/2017 1201   K 3.5 12/14/2017 2303   CL 101 12/14/2017 2303   CO2 26 12/14/2017 2303   GLUCOSE 101 (H) 12/14/2017 2303   BUN 13 12/14/2017 2303   BUN 10 12/08/2017 1201   CREATININE 0.78 12/14/2017 2303   CREATININE 0.64 02/01/2014 1005   CALCIUM 9.4 12/14/2017 2303   GFRNONAA >60 12/14/2017 2303   GFRAA >60 12/14/2017 2303   Lab Results  Component Value Date   HGBA1C 6.2 (H) 12/08/2017   HGBA1C 6.4 12/24/2016   HGBA1C 6.0 01/21/2016   HGBA1C 6.0 05/03/2015   Lab Results  Component Value Date   INSULIN 20.9 12/08/2017   CBC    Component Value Date/Time   WBC 5.2 12/14/2017 2303   RBC 4.65 12/14/2017 2303   HGB 13.7 12/14/2017 2303   HGB 12.9 12/08/2017 1201   HCT 40.4 12/14/2017 2303   HCT 39.5  12/08/2017 1201   PLT 379 12/14/2017 2303   MCV 86.9 12/14/2017 2303   MCV 89 12/08/2017 1201   MCH 29.5 12/14/2017 2303   MCHC 33.9 12/14/2017 2303   RDW 13.7 12/14/2017 2303   RDW 15.0 12/08/2017 1201   LYMPHSABS 2.0 12/08/2017 1201   MONOABS 0.3 02/01/2014 1005   EOSABS 0.4 12/08/2017 1201   BASOSABS 0.0 12/08/2017 1201   Iron/TIBC/Ferritin/ %Sat No results found for: IRON, TIBC, FERRITIN, IRONPCTSAT Lipid Panel     Component Value Date/Time   CHOL  214 (H) 12/08/2017 1201   TRIG 143 12/08/2017 1201   HDL 41 12/08/2017 1201   CHOLHDL 7 12/24/2016 1142   VLDL 27.4 12/24/2016 1142   LDLCALC 144 (H) 12/08/2017 1201   LDLDIRECT 177 (H) 10/28/2010 0916   Hepatic Function Panel     Component Value Date/Time   PROT 7.4 12/08/2017 1201   ALBUMIN 4.2 12/08/2017 1201   AST 18 12/08/2017 1201   ALT 11 12/08/2017 1201   ALKPHOS 114 12/08/2017 1201   BILITOT 0.4 12/08/2017 1201      Component Value Date/Time   TSH 1.310 12/08/2017 1201   TSH 1.125 02/01/2014 1005   TSH 1.310 05/06/2011 1131   Results for YAMAIRA, SPINNER (MRN [id number]) as of 01/11/2018 10:40  Ref. Range 12/08/2017 12:01  Vitamin D, 25-Hydroxy Latest Ref Range: 30.0 - 100.0 ng/mL 4.6 (L)   ASSESSMENT AND PLAN: Vitamin D deficiency  Chronic left-sided thoracic back pain  Prediabetes  Class 3 severe obesity with serious comorbidity and body mass index (BMI) of 50.0 to 59.9 in adult, unspecified obesity type ([place])  PLAN:  Vitamin D Deficiency Shauntae was informed that low vitamin D levels contributes to fatigue and are associated with obesity, breast, and colon cancer. She agrees to continue to take prescription Vit D @50 ,000 IU every week and will follow up for routine testing of vitamin D, at least 2-3 times per year. She was informed of the risk of over-replacement of vitamin D and agrees to not increase her dose unless she discusses this with [place] first.  Back Pain (Left sided thoracic) Toneka was  encouraged to seek osteopathic manipulative therapy from an osteopathic therapy doctor, for her back pain. Tashiya agrees to follow up with our clinic in 2 weeks.  Pre-Diabetes Collen will continue to work on weight loss, exercise, and decreasing simple carbohydrates in her diet to help decrease the risk of diabetes. We dicussed metformin including benefits and risks. She was informed that eating too many simple carbohydrates or too many calories at one sitting increases the likelihood of GI side effects. Rye agrees to continue metformin and follow up with [place] as directed to monitor her progress.  We spent > than 50% of the 15 minute visit on the counseling as documented in the note.  Obesity Jordie is currently in the action stage of change. As such, her goal is to continue with weight loss efforts She has agreed to keep a food journal with 450 to 600 calories and 40+ grams of protein at supper daily and follow the Category 3 plan Giabella has been instructed to work up to a goal of 150 minutes of combined cardio and strengthening exercise per week for weight loss and overall health benefits. We discussed the following Behavioral Modification Strategies today: planning for success, keep a strict food journal, increasing lean protein intake, increasing vegetables and work on meal planning and easy cooking plans  Nattie has agreed to follow up with our clinic in 2 weeks. She was informed of the importance of frequent follow up visits to maximize her success with intensive lifestyle modifications for her multiple health conditions.   OBESITY BEHAVIORAL INTERVENTION VISIT  Today's visit was # 3 out of 22.  Starting weight: 335 lbs Starting date: 12/08/17 Today's weight : 318 lbs Today's date: 01/10/2018 Total lbs lost to date: 17 (Patients must lose 7 lbs in the first 6 months to continue with counseling)   ASK: We discussed the diagnosis of obesity with [name]  today and Early agreed to  give [place] permission to discuss obesity behavioral modification therapy today.  ASSESS: Shatisha has the diagnosis of obesity and her BMI today is 52.92 Marien is in the action stage of change   ADVISE: Annalisia was educated on the multiple health risks of obesity as well as the benefit of weight loss to improve her health. She was advised of the need for long term treatment and the importance of lifestyle modifications.  AGREE: Multiple dietary modification options and treatment options were discussed and  Anderson agreed to the above obesity treatment plan.  I, Doreene Nest, am acting as transcriptionist for Eber Jones, MD  I have reviewed the above documentation for accuracy and completeness, and I agree with the above. - Ilene Qua, MD

## 2018-01-11 NOTE — Progress Notes (Signed)
Cardiology Office Note    Date:  01/11/2018   ID:  Danielle Shaw, DOB [birth year], MRN [id number]  PCP:  Hoyt Koch, MD  Cardiologist: Previously Dr. Harrington Challenger (2012) and Dr. Mare Ferrari (2015)  Chief Complaint  Patient presents with  . New Patient (Initial Visit)    recent ED visit, last cardiology visit 2015  . Shortness of Breath    Not as bad.  . Headache  . Edema    Legs, feet, and ankes but has gotten better.    History of Present Illness:  Danielle Shaw is a 47 y.o. female with past medical history of hypertension, hyperlipidemia, morbid obesity, prediabetes, vitamin D deficiency, palpitation and GERD.  She was seen in 2012 by Dr. Harrington Challenger, at the time, she was evaluated for palpitation.  Holter monitor showed PAC and PVCs with one short burst of atrial fibrillation.  Echocardiogram in 2012 showed normal EF 55 to 60%, mild LAE.  She was placed on beta-blocker by Dr. Harrington Challenger, however did not tolerated due to malaise and fatigue.  She was given a prescription for diltiazem to have on hand for symptomatic palpitation, however this prescription was never filled.  She was later seen by Dr. Mare Ferrari on 02/16/2014 complaining of recurrent palpitation.  It was advised for her not to drink caffeine and encourage weight loss.  Due to her borderline hypokalemia, a daily 20 mEq potassium chloride was prescribed.  She was recently seen in the ED on 12/15/2017 for left flank pain.  Urine showed moderate blood, large leukocytes and amorphous crystals.  Her symptom was concerning for pyelonephritis, however patient was concerned that this may be related to her heart based on her family history.  Her troponin was normal, d-dimer was within normal limits as well.  She was treated for possible pyelonephritis with Keflex and refer to cardiology service as outpatient.  She presents today for cardiology office evaluation.  She continued to have left flank pain, this is worse every time she cough.  It also  worsens with deep inspiration, body rotation or palpation.  This is clearly not related to cardiac issue.  She does complain of occasional substernal chest discomfort, this substernal chest discomfort only occurs 1-2 times per month, it does not associated with exertion.  I recommended plain old treadmill test.  She has significant family history of early CAD with her mother died of MI in her 61s and the father having heart issue before age 32.  She is morbidly obese and has hypertension, hyperlipidemia and prediabetes.  She also complain of occasional tachypalpitations that can last up to 35 minutes each.  If this occurs 1-2 times per week, I will proceed with a 30-day event monitor for further evaluation.  Otherwise she has no lower extremity edema, orthopnea or PND.  She works 2 jobs.  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  Past Medical History:  Diagnosis Date  . Arrhythmia   . Arthritis   . GERD (gastroesophageal reflux disease)   . Hyperlipidemia   . Hypertension   . IUD 2004  . IUD 2009  . Migraine   . SOB (shortness of breath)     Past Surgical History:  Procedure Laterality Date  . BREAST BIOPSY Right 2011  . BREAST SURGERY  2000   reduction  . CESAREAN SECTION      Current Medications: Outpatient Medications Prior to Visit  Medication Sig Dispense Refill  .  albuterol (PROVENTIL HFA;VENTOLIN HFA) 108 (90 Base) MCG/ACT inhaler INHALE TWO PUFFS BY MOUTH EVERY 6 HOURS AS NEEDED FOR WHEEZING OR SHORTNESS OF BREATH 18 each 1  . aspirin 81 MG tablet Take 81 mg by mouth daily.     . cyclobenzaprine (FLEXERIL) 5 MG tablet Take 1 tablet (5 mg total) by mouth 3 (three) times daily as needed for muscle spasms. 30 tablet 1  . HYDROcodone-homatropine (HYCODAN) 5-1.5 MG/5ML syrup Take 5-10 mLs by mouth at bedtime as needed for cough. 50 mL 0  . ipratropium (ATROVENT) 0.03 % nasal spray Place 2 sprays into both nostrils every 12  (twelve) hours. 30 mL 12  . levocetirizine (XYZAL) 5 MG tablet Take 5 mg by mouth every evening.    [name] losartan-hydrochlorothiazide (HYZAAR) 50-12.5 MG tablet Take 1 tablet by mouth daily. 90 tablet 3  . meloxicam (MOBIC) 15 MG tablet Take 1 tablet (15 mg total) by mouth daily as needed for pain. 30 tablet 1  . metFORMIN (GLUCOPHAGE) 500 MG tablet Take 1 tablet (500 mg total) by mouth daily with breakfast. 30 tablet 0  . modafinil (PROVIGIL) 100 MG tablet Use when sleepy at work or driving , do not use within 4 hours of intended bedtime. 30 tablet 0  . pantoprazole (PROTONIX) 40 MG tablet Take 1 tablet (40 mg total) by mouth daily. 90 tablet 3  . rosuvastatin (CRESTOR) 20 MG tablet Take 1 tablet (20 mg total) by mouth daily. 90 tablet 3  . traMADol (ULTRAM) 50 MG tablet Take 1 tablet (50 mg total) by mouth every 8 (eight) hours as needed. 30 tablet 0  . Vitamin D, Ergocalciferol, (DRISDOL) 50000 units CAPS capsule Take 1 capsule (50,000 Units total) by mouth every 7 (seven) days. 4 capsule 0   No facility-administered medications prior to visit.      Allergies:   Latex; Lisinopril; and Metoprolol   Social History   Socioeconomic History  . Marital status: Significant Other    Spouse name: Not on file  . Number of children: 2  . Years of education: Not on file  . Highest education level: Not on file  Occupational History  . Not on file  Social Needs  . Financial resource strain: Not on file  . Food insecurity:    Worry: Not on file    Inability: Not on file  . Transportation needs:    Medical: Not on file    Non-medical: Not on file  Tobacco Use  . Smoking status: Never Smoker  . Smokeless tobacco: Never Used  Substance and Sexual Activity  . Alcohol use: No    Alcohol/week: 0.0 oz  . Drug use: No  . Sexual activity: Yes    Partners: Male  Lifestyle  . Physical activity:    Days per week: Not on file    Minutes per session: Not on file  . Stress: Not on file    Relationships  . Social connections:    Talks on phone: Not on file    Gets together: Not on file    Attends religious service: Not on file    Active member of club or organization: Not on file    Attends meetings of clubs or organizations: Not on file    Relationship status: Not on file  Other Topics Concern  . Not on file  Social History Narrative  . Not on file     Family History:  The patient's family history includes Diabetes in her father; Heart disease  in her father and maternal grandmother; Heart disease (age of onset: 44) in her mother; High Cholesterol in her father and mother; High blood pressure in her father and mother; Hodgkin's lymphoma in her daughter.   ROS:   Please see the history of present illness.    ROS All other systems reviewed and are negative.   PHYSICAL EXAM:   VS:  BP 138/86 (BP Location: Left Arm, Patient Position: Sitting, Cuff Size: Large)   Pulse 84   Ht 5\' 5"  (1.651 m)   Wt (!) 325 lb (147.4 kg)   BMI 54.08 kg/m    GEN: Well nourished, well developed, in no acute distress  HEENT: normal  Neck: no JVD, carotid bruits, or masses Cardiac: RRR; no murmurs, rubs, or gallops,no edema  Respiratory:  clear to auscultation bilaterally, normal work of breathing GI: soft, nontender, nondistended, + BS MS: no deformity or atrophy  Skin: warm and dry, no rash Neuro:  Alert and Oriented x 3, Strength and sensation are intact Psych: euthymic mood, full affect  Wt Readings from Last 3 Encounters:  01/11/18 (!) 325 lb (147.4 kg)  01/10/18 (!) 318 lb (144.2 kg)  01/05/18 (!) 322 lb (146.1 kg)      Studies/Labs Reviewed:   EKG:  EKG is ordered today.  The ekg ordered today demonstrates normal sinus rhythm, poor R wave progression anterior lead  Recent Labs: 12/08/2017: ALT 11; TSH 1.310 12/14/2017: BUN 13; Creatinine, Ser 0.78; Hemoglobin 13.7; Platelets 379; Potassium 3.5; Sodium 137   Lipid Panel    Component Value Date/Time   CHOL 214 (H)  12/08/2017 1201   TRIG 143 12/08/2017 1201   HDL 41 12/08/2017 1201   CHOLHDL 7 12/24/2016 1142   VLDL 27.4 12/24/2016 1142   LDLCALC 144 (H) 12/08/2017 1201   LDLDIRECT 177 (H) 10/28/2010 0916    Additional studies/ records that were reviewed today include:   Echo 05/13/2011 LV EF: 55% -  60% Study Conclusions  - Left ventricle: The cavity size was normal. Wall thickness was increased in a pattern of moderate LVH. Systolic function was normal. The estimated ejection fraction was in the range of 55% to 60%. Wall motion was normal; there were no regional wall motion abnormalities. - Left atrium: The atrium was mildly dilated.    ASSESSMENT:    1. Chest pain, unspecified type   2. Palpitation   3. Essential hypertension   4. Hyperlipidemia, unspecified hyperlipidemia type   5. Morbid obesity ([place])   6. Prediabetes      PLAN:  In order of problems listed above:  1. Chest pain: Her chest pain had to occur 1-2 times per month, it is somewhat atypical and does not consistently occur with exertion.  I recommended plain old treadmill test for further evaluation.  She has significant family history of early CAD, her other cardiac risk factors include morbid obesity, hypertension, hyperlipidemia and prediabetes.  Plan has been discussed with DOD Dr. Gwenlyn Found who also agrees.  2. Palpitation: She has 1-2 episode of prolonged palpitations that last up to 35 minutes each per week.  Previous heart monitor in 2012 showed PACs, PVCs and a short burst of A. fib.  I recommended proceed with a 30-day event monitor in this case.  If she does have recurrence of tachycardia palpitation, we will consider using diltiazem.  She has a history of intolerance to what metoprolol due to side effect of severe fatigue  3. Hypertension: Her blood pressure is stable  4. Hyperlipidemia:  Continue Crestor 20 mg daily  5. Prediabetes: Followed by primary care provider  6. Morbid obesity: According to  the patient, she did lose about 17 pounds, however majority of her issue is related to her obesity, she will need to continue to lose weight.    Medication Adjustments/Labs and Tests Ordered: Current medicines are reviewed at length with the patient today.  Concerns regarding medicines are outlined above.  Medication changes, Labs and Tests ordered today are listed in the Patient Instructions below. Patient Instructions  Medication Instructions: Your physician recommends that you continue on your current medications as directed.    If you need a refill on your cardiac medications before your next appointment, please call your pharmacy.   Procedures/Testing: Your physician has recommended that you wear an event monitor for 30 days. Event monitors are medical devices that record the heart's electrical activity. Doctors most often [place] these monitors to diagnose arrhythmias. Arrhythmias are problems with the speed or rhythm of the heartbeat. The monitor is a small, portable device. You can wear one while you do your normal daily activities. This is usually used to diagnose what is causing palpitations/syncope (passing out). [place] has requested that you have an exercise tolerance test. For further information please visit [email or web address]. Please also follow instruction sheet, as given. [place]: Your physician wants you to follow-up in 3 months with Dr. Harrington Challenger.   Special Instructions:    Thank you for choosing [facility], [place], [place]  01/11/2018 5:06 PM    [facility] [place], [place], [place]  [address] Phone: [phone]; Fax: [phone]

## 2018-01-12 ENCOUNTER — Other Ambulatory Visit: Payer: Self-pay

## 2018-01-12 ENCOUNTER — Telehealth (HOSPITAL_COMMUNITY): Payer: Self-pay

## 2018-01-12 ENCOUNTER — Encounter: Payer: Self-pay | Admitting: Internal Medicine

## 2018-01-12 MED ORDER — ALBUTEROL SULFATE HFA 108 (90 BASE) MCG/ACT IN AERS: INHALATION_SPRAY | RESPIRATORY_TRACT | 1 refills | Status: DC

## 2018-01-12 MED ORDER — ROSUVASTATIN CALCIUM 20 MG PO TABS: 20.0000 mg | ORAL_TABLET | Freq: Every day | ORAL | 3 refills | Status: DC

## 2018-01-12 MED ORDER — PANTOPRAZOLE SODIUM 40 MG PO TBEC: 40.0000 mg | DELAYED_RELEASE_TABLET | Freq: Every day | ORAL | 3 refills | Status: AC

## 2018-01-12 MED ORDER — LOSARTAN POTASSIUM-HCTZ 50-12.5 MG PO TABS: 1.0000 | ORAL_TABLET | Freq: Every day | ORAL | 3 refills | Status: DC

## 2018-01-12 MED ORDER — IPRATROPIUM BROMIDE 0.03 % NA SOLN: 2.0000 | Freq: Two times a day (BID) | NASAL | 12 refills | Status: DC

## 2018-01-12 NOTE — Telephone Encounter (Signed)
Encounter complete. 

## 2018-01-13 MED ORDER — TRAMADOL HCL 50 MG PO TABS: 50.0000 mg | ORAL_TABLET | Freq: Three times a day (TID) | ORAL | 0 refills | Status: DC | PRN

## 2018-01-14 ENCOUNTER — Ambulatory Visit (HOSPITAL_COMMUNITY)
Admission: RE | Admit: 2018-01-14 | Discharge: 2018-01-14 | Disposition: A | Discharge status: Home / Self Care | Payer: BLUE CROSS/BLUE SHIELD | Source: Ambulatory Visit | Attending: Cardiovascular Disease | Admitting: Cardiovascular Disease

## 2018-01-14 DIAGNOSIS — R079 Chest pain, unspecified: Secondary | ICD-10-CM | POA: Diagnosis not present

## 2018-01-14 LAB — EXERCISE TOLERANCE TEST
CHL CUP RESTING HR STRESS: 66 {beats}/min
CHL RATE OF PERCEIVED EXERTION: 18
CSEPEW: 7.6 METS
CSEPPHR: 173 {beats}/min
Exercise duration (min): 6 min
Exercise duration (sec): 26 s
MPHR: 175 {beats}/min
Percent HR: 98 %

## 2018-01-15 NOTE — Progress Notes (Signed)
[facility] [address] [place], [place] [address] Phone: [phone] Subjective:    I'm seeing this patient by the request  of:  Hoyt Koch, MD   CC: Low back pain  OIN:[id number]  Danielle Shaw is a 47 y.o. female coming in with complaint of back pain. Left sided back pain where her kidney is. Had a kidney infection a month ago and states she still has pain. Sleeps on the left side on a pillow. Pain radiates up and down her back. Numbness and tingling in left foot.    Onset-been going on for months Location- lower back Duration-   Character- Sharp, constant ache Aggravating factors- Turning, any type of movement  Reliving factors-  Therapies tried- Heat for about 20 mins  Severity- 7/10     Past Medical History:  Diagnosis Date  . Arrhythmia   . Arthritis   . GERD (gastroesophageal reflux disease)   . Hyperlipidemia   . Hypertension   . IUD 2004  . IUD 2009  . Migraine   . SOB (shortness of breath)    Past Surgical History:  Procedure Laterality Date  . BREAST BIOPSY Right 2011  . BREAST SURGERY  2000   reduction  . CESAREAN SECTION     Social History   Socioeconomic History  . Marital status: Significant Other    Spouse name: Not on file  . Number of children: 2  . Years of education: Not on file  . Highest education level: Not on file  Occupational History  . Not on file  Social Needs  . Financial resource strain: Not on file  . Food insecurity:    Worry: Not on file    Inability: Not on file  . Transportation needs:    Medical: Not on file    Non-medical: Not on file  Tobacco Use  . Smoking status: Never Smoker  . Smokeless tobacco: Never Used  Substance and Sexual Activity  . Alcohol use: No    Alcohol/week: 0.0 oz  . Drug use: No  . Sexual activity: Yes    Partners: Male  Lifestyle  . Physical activity:    Days per week: Not on file    Minutes per session: Not on file  . Stress: Not on file    Relationships  . Social connections:    Talks on phone: Not on file    Gets together: Not on file    Attends religious service: Not on file    Active member of club or organization: Not on file    Attends meetings of clubs or organizations: Not on file    Relationship status: Not on file  Other Topics Concern  . Not on file  Social History Narrative  . Not on file   Allergies  Allergen Reactions  . Latex   . Lisinopril Cough  . Metoprolol Other (See Comments)    Funny feeling   Family History  Problem Relation Age of Onset  . Heart disease Mother 66  . High Cholesterol Mother   . High blood pressure Mother   . Diabetes Father   . Heart disease Father   . High blood pressure Father   . High Cholesterol Father   . Heart disease Maternal Grandmother   . Hodgkin's lymphoma Daughter      Past medical history, social, surgical and family history all reviewed in electronic medical record.  No pertanent information unless stated regarding to the chief complaint.  Review of Systems:Review of systems updated and as accurate as of 01/17/18  No headache, visual changes, nausea, vomiting, diarrhea, constipation, dizziness, abdominal pain, skin rash, fevers, chills, night sweats, weight loss, swollen lymph nodes, body aches, joint swelling, chest pain, shortness of breath, mood changes.  Positive muscle aches  Objective  Blood pressure 110/80, pulse 66, height 5\' 5"  (1.651 m), weight (!) 320 lb (145.2 kg), SpO2 96 %. Systems examined below as of 01/17/18   General: No apparent distress alert and oriented x3 mood and affect normal, dressed appropriately.  Morbidly obese HEENT: Pupils equal, extraocular movements intact  Respiratory: Patient's speak in full sentences and does not appear short of breath  Cardiovascular: Trace lower extremity edema, non tender, no erythema  Skin: Warm dry intact with no signs of infection or rash on extremities or on axial skeleton.  Abdomen: Soft  nontender significantly obese Neuro: Cranial nerves II through XII are intact, neurovascularly intact in all extremities with 2+ DTRs and 2+ pulses.  Lymph: No lymphadenopathy of posterior or anterior cervical chain or axillae bilaterally.  Gait mild antalgic MSK:  Non tender with full range of motion and good stability and symmetric strength and tone of shoulders, elbows, wrist, hip, knee and ankles bilaterally.  Back Exam:  Inspection: Loss of lordosis with poor core strength Motion: Flexion 45 deg, Extension 25 deg, Side Bending to 30 deg bilaterally,  Rotation to 35 deg bilaterally  SLR laying: Negative  XSLR laying: Negative  Palpable tenderness: Tender to palpation the paraspinal musculature in the left sacroiliac joint FABER: Tightness bilaterally. Sensory change: Gross sensation intact to all lumbar and sacral dermatomes.  Reflexes: 2+ at both patellar tendons, 2+ at achilles tendons, Babinski's downgoing.  Strength at foot  Plantar-flexion: 5/5 Dorsi-flexion: 5/5 Eversion: 5/5 Inversion: 5/5  Leg strength  Quad: 5/5 Hamstring: 5/5 Hip flexor: 4/5 symmetric hip abductors: 4/5  Gait unremarkable.  [id number]; 15 additional minutes spent for Therapeutic exercises as stated in above notes.  This included exercises focusing on stretching, strengthening, with significant focus on eccentric aspects.   Long term goals include an improvement in range of motion, strength, endurance as well as avoiding reinjury. Patient's frequency would include in 1-2 times a day, 3-5 times a week for a duration of 6-12 weeks. Sacroiliac Joint Mobilization and Rehab 1. Work on pretzel stretching, shoulder back and leg draped in front. 3-5 sets, 30 sec.. 2. hip abductor rotations. standing, hip flexion and rotation outward then inward. 3 sets, 15 reps. when can do comfortably, add ankle weights starting at 2 pounds.  3. cross over stretching - shoulder back to ground, same side leg crossover. 3-5 sets for 30 min..    4. rolling up and back knees to chest and rocking. 5. sacral tilt - 5 sets, hold for 5-10 seconds   Proper technique shown and discussed handout in great detail with ATC.  All questions were discussed and answered.       Impression and Recommendations:     This case required medical decision making of moderate complexity.      Note: This dictation was prepared with Dragon dictation along with smaller phrase technology. Any transcriptional errors that result from this process are unintentional.

## 2018-01-17 ENCOUNTER — Encounter: Payer: Self-pay | Admitting: Family Medicine

## 2018-01-17 ENCOUNTER — Ambulatory Visit (INDEPENDENT_AMBULATORY_CARE_PROVIDER_SITE_OTHER): Payer: BLUE CROSS/BLUE SHIELD | Admitting: Family Medicine

## 2018-01-17 DIAGNOSIS — M533 Sacrococcygeal disorders, not elsewhere classified: Secondary | ICD-10-CM | POA: Diagnosis not present

## 2018-01-17 MED ORDER — VITAMIN D (ERGOCALCIFEROL) 1.25 MG (50000 UNIT) PO CAPS: 50000.0000 [IU] | ORAL_CAPSULE | ORAL | 0 refills | Status: DC

## 2018-01-17 NOTE — Patient Instructions (Signed)
Good to see you.  Ice 20 minutes 2 times daily. Usually after activity and before bed. Once weekly vitamin D  Over the counter get  Turmeric 500mg  daily  Tart cherry extract any dos eat night Good shoes with rigid bottom.  Jalene Mullet, [place] or New balance greater then 700 Exercises 3 times a week.   See me again in 4 weeks

## 2018-01-17 NOTE — Assessment & Plan Note (Signed)
Sacroiliac dysfunction.  Discussed icing regimen, home exercise, topical anti-inflammatories.  Over-the-counter medications.  I think patient will do well with conservative therapy.  Worsening symptoms consider x-ray and physical therapy.  Differential includes patient's facet arthropathy.  Mild signs of radicular symptoms but likely more secondary to piriformis syndrome.  Patient will follow-up again in 4 weeks

## 2018-01-18 ENCOUNTER — Ambulatory Visit
Admission: RE | Admit: 2018-01-18 | Discharge: 2018-01-18 | Disposition: A | Discharge status: Home / Self Care | Payer: BLUE CROSS/BLUE SHIELD | Source: Ambulatory Visit | Attending: Internal Medicine | Admitting: Internal Medicine

## 2018-01-18 DIAGNOSIS — R109 Unspecified abdominal pain: Secondary | ICD-10-CM

## 2018-01-19 ENCOUNTER — Ambulatory Visit (INDEPENDENT_AMBULATORY_CARE_PROVIDER_SITE_OTHER): Payer: BLUE CROSS/BLUE SHIELD

## 2018-01-19 DIAGNOSIS — R002 Palpitations: Secondary | ICD-10-CM

## 2018-01-24 ENCOUNTER — Ambulatory Visit (INDEPENDENT_AMBULATORY_CARE_PROVIDER_SITE_OTHER): Payer: BLUE CROSS/BLUE SHIELD | Admitting: Family Medicine

## 2018-01-24 VITALS — BP 117/76 | HR 57 | Temp 98.2°F | Ht 65.0 in | Wt 313.0 lb

## 2018-01-24 DIAGNOSIS — Z9189 Other specified personal risk factors, not elsewhere classified: Secondary | ICD-10-CM | POA: Diagnosis not present

## 2018-01-24 DIAGNOSIS — R7303 Prediabetes: Secondary | ICD-10-CM

## 2018-01-24 DIAGNOSIS — E559 Vitamin D deficiency, unspecified: Secondary | ICD-10-CM

## 2018-01-24 DIAGNOSIS — Z6841 Body Mass Index (BMI) 40.0 and over, adult: Secondary | ICD-10-CM | POA: Diagnosis not present

## 2018-01-24 MED ORDER — METFORMIN HCL 500 MG PO TABS: 500.0000 mg | ORAL_TABLET | Freq: Every day | ORAL | 0 refills | Status: DC

## 2018-01-24 NOTE — Progress Notes (Signed)
Office: [phone]  /  Fax: [phone]   HPI:   Chief Complaint: OBESITY Danielle Shaw is here to discuss her progress with her obesity treatment plan. She is on the keep a food journal with 450 to 600 calories and 40+ grams of protein at supper daily and follow the Category 3 plan and is following her eating plan approximately 95 % of the time. She states she is exercising 0 minutes 0 times per week. Danielle Shaw is mostly following the category 3 plan. She did enjoy a family cookout two days ago. She just had a heart monitor placed for arrhythmia. Her weight is (!) 313 lb (142 kg) today and has had a weight loss of 5 pounds over a period of 2 weeks since her last visit. She has lost 22 lbs since starting treatment with [place].  Vitamin D deficiency Danielle Shaw has a diagnosis of vitamin D deficiency. She just got a refill of vit D from Dr. Tamala Julian. Fatigue is improving and she denies nausea, vomiting or muscle weakness.  Pre-Diabetes Danielle Shaw has a diagnosis of prediabetes based on her elevated Hgb A1c and was informed this puts her at greater risk of developing diabetes. She has no GI side effects of metformin. Danielle Shaw continues to work on diet and exercise to decrease risk of diabetes. She denies nausea, carb cravings or hypoglycemia.  At risk for diabetes Danielle Shaw is at higher than average risk for developing diabetes due to her obesity and pre-diabetes. She currently denies polyuria or polydipsia.  ALLERGIES: Allergies  Allergen Reactions  . Latex   . Lisinopril Cough  . Metoprolol Other (See Comments)    Funny feeling    MEDICATIONS: Current Outpatient Medications on File Prior to Visit  Medication Sig Dispense Refill  . albuterol (PROVENTIL HFA;VENTOLIN HFA) 108 (90 Base) MCG/ACT inhaler INHALE TWO PUFFS BY MOUTH EVERY 6 HOURS AS NEEDED FOR WHEEZING OR SHORTNESS OF BREATH 18 each 1  . aspirin 81 MG tablet Take 81 mg by mouth daily.     . cyclobenzaprine (FLEXERIL) 5 MG tablet Take 1 tablet (5 mg total)  by mouth 3 (three) times daily as needed for muscle spasms. 30 tablet 1  . HYDROcodone-homatropine (HYCODAN) 5-1.5 MG/5ML syrup Take 5-10 mLs by mouth at bedtime as needed for cough. 50 mL 0  . ipratropium (ATROVENT) 0.03 % nasal spray Place 2 sprays into both nostrils every 12 (twelve) hours. 30 mL 12  . levocetirizine (XYZAL) 5 MG tablet Take 5 mg by mouth every evening.    [name] losartan-hydrochlorothiazide (HYZAAR) 50-12.5 MG tablet Take 1 tablet by mouth daily. 90 tablet 3  . meloxicam (MOBIC) 15 MG tablet Take 1 tablet (15 mg total) by mouth daily as needed for pain. 30 tablet 1  . modafinil (PROVIGIL) 100 MG tablet Use when sleepy at work or driving , do not use within 4 hours of intended bedtime. 30 tablet 0  . pantoprazole (PROTONIX) 40 MG tablet Take 1 tablet (40 mg total) by mouth daily. 90 tablet 3  . rosuvastatin (CRESTOR) 20 MG tablet Take 1 tablet (20 mg total) by mouth daily. 90 tablet 3  . traMADol (ULTRAM) 50 MG tablet Take 1 tablet (50 mg total) by mouth every 8 (eight) hours as needed. 30 tablet 0  . Vitamin D, Ergocalciferol, (DRISDOL) 50000 units CAPS capsule Take 1 capsule (50,000 Units total) by mouth every 7 (seven) days. 12 capsule 0   No current facility-administered medications on file prior to visit.     PAST MEDICAL  HISTORY: Past Medical History:  Diagnosis Date  . Arrhythmia   . Arthritis   . GERD (gastroesophageal reflux disease)   . Hyperlipidemia   . Hypertension   . IUD 2004  . IUD 2009  . Migraine   . SOB (shortness of breath)     PAST SURGICAL HISTORY: Past Surgical History:  Procedure Laterality Date  . BREAST BIOPSY Right 2011  . BREAST SURGERY  2000   reduction  . CESAREAN SECTION      SOCIAL HISTORY: Social History   Tobacco Use  . Smoking status: Never Smoker  . Smokeless tobacco: Never Used  Substance Use Topics  . Alcohol use: No    Alcohol/week: 0.0 oz  . Drug use: No    FAMILY HISTORY: Family History  Problem Relation Age  of Onset  . Heart disease Mother 65  . High Cholesterol Mother   . High blood pressure Mother   . Diabetes Father   . Heart disease Father   . High blood pressure Father   . High Cholesterol Father   . Heart disease Maternal Grandmother   . Hodgkin's lymphoma Daughter     ROS: Review of Systems  Constitutional: Positive for weight loss.  Gastrointestinal: Negative for nausea and vomiting.  Genitourinary: Negative for frequency.  Musculoskeletal:       Negative for muscle weakness  Endo/Heme/Allergies: Negative for polydipsia.       Negative for carb cravings Negative for hypoglycemia    PHYSICAL EXAM: Blood pressure 117/76, pulse (!) 57, temperature 98.2 F (36.8 C), temperature source Oral, height 5\' 5"  (1.651 m), weight (!) 313 lb (142 kg), SpO2 98 %. Body mass index is 52.09 kg/m. Physical Exam  Constitutional: She is oriented to person, place, and time. She appears well-developed and well-nourished.  Cardiovascular: Normal rate.  Pulmonary/Chest: Effort normal.  Musculoskeletal: Normal range of motion.  Neurological: She is oriented to person, place, and time.  Skin: Skin is warm and dry.  Psychiatric: She has a normal mood and affect. Her behavior is normal.  Vitals reviewed.   RECENT LABS AND TESTS: BMET    Component Value Date/Time   NA 137 12/14/2017 2303   NA 139 12/08/2017 1201   K 3.5 12/14/2017 2303   CL 101 12/14/2017 2303   CO2 26 12/14/2017 2303   GLUCOSE 101 (H) 12/14/2017 2303   BUN 13 12/14/2017 2303   BUN 10 12/08/2017 1201   CREATININE 0.78 12/14/2017 2303   CREATININE 0.64 02/01/2014 1005   CALCIUM 9.4 12/14/2017 2303   GFRNONAA >60 12/14/2017 2303   GFRAA >60 12/14/2017 2303   Lab Results  Component Value Date   HGBA1C 6.2 (H) 12/08/2017   HGBA1C 6.4 12/24/2016   HGBA1C 6.0 01/21/2016   HGBA1C 6.0 05/03/2015   Lab Results  Component Value Date   INSULIN 20.9 12/08/2017   CBC    Component Value Date/Time   WBC 5.2 12/14/2017  2303   RBC 4.65 12/14/2017 2303   HGB 13.7 12/14/2017 2303   HGB 12.9 12/08/2017 1201   HCT 40.4 12/14/2017 2303   HCT 39.5 12/08/2017 1201   PLT 379 12/14/2017 2303   MCV 86.9 12/14/2017 2303   MCV 89 12/08/2017 1201   MCH 29.5 12/14/2017 2303   MCHC 33.9 12/14/2017 2303   RDW 13.7 12/14/2017 2303   RDW 15.0 12/08/2017 1201   LYMPHSABS 2.0 12/08/2017 1201   MONOABS 0.3 02/01/2014 1005   EOSABS 0.4 12/08/2017 1201   BASOSABS 0.0 12/08/2017 1201  Iron/TIBC/Ferritin/ %Sat No results found for: IRON, TIBC, FERRITIN, IRONPCTSAT Lipid Panel     Component Value Date/Time   CHOL 214 (H) 12/08/2017 1201   TRIG 143 12/08/2017 1201   HDL 41 12/08/2017 1201   CHOLHDL 7 12/24/2016 1142   VLDL 27.4 12/24/2016 1142   LDLCALC 144 (H) 12/08/2017 1201   LDLDIRECT 177 (H) 10/28/2010 0916   Hepatic Function Panel     Component Value Date/Time   PROT 7.4 12/08/2017 1201   ALBUMIN 4.2 12/08/2017 1201   AST 18 12/08/2017 1201   ALT 11 12/08/2017 1201   ALKPHOS 114 12/08/2017 1201   BILITOT 0.4 12/08/2017 1201      Component Value Date/Time   TSH 1.310 12/08/2017 1201   TSH 1.125 02/01/2014 1005   TSH 1.310 05/06/2011 1131   Results for LELER, BRION (MRN [id number]) as of 01/24/2018 10:29  Ref. Range 12/08/2017 12:01  Vitamin D, 25-Hydroxy Latest Ref Range: 30.0 - 100.0 ng/mL 4.6 (L)   ASSESSMENT AND PLAN: Vitamin D deficiency  Prediabetes - Plan: metFORMIN (GLUCOPHAGE) 500 MG tablet  At risk for diabetes mellitus  Class 3 severe obesity with serious comorbidity and body mass index (BMI) of 50.0 to 59.9 in adult, unspecified obesity type ([place])  PLAN:  Vitamin D Deficiency Danielle Shaw was informed that low vitamin D levels contributes to fatigue and are associated with obesity, breast, and colon cancer. She agrees to continue to take prescription Vit D @50 ,000 IU every week and will follow up for routine testing of vitamin D, at least 2-3 times per year. She was informed of the  risk of over-replacement of vitamin D and agrees to not increase her dose unless she discusses this with [place] first.  Pre-Diabetes Danielle Shaw will continue to work on weight loss, exercise, and decreasing simple carbohydrates in her diet to help decrease the risk of diabetes. We dicussed metformin including benefits and risks. She was informed that eating too many simple carbohydrates or too many calories at one sitting increases the likelihood of GI side effects. Danielle Shaw requested metformin for now and a prescription was written today for 1 month refill. Danielle Shaw agreed to follow up with [place] as directed to monitor her progress.  Diabetes risk counseling Danielle Shaw was given extended (15 minutes) diabetes prevention counseling today. She is 47 y.o. female and has risk factors for diabetes including obesity and pre-diabetes. We discussed intensive lifestyle modifications today with an emphasis on weight loss as well as increasing exercise and decreasing simple carbohydrates in her diet.  Obesity Danielle Shaw is currently in the action stage of change. As such, her goal is to continue with weight loss efforts She has agreed to keep a food journal with 450 to 600 calories and 40 grams of protein at supper daily and follow the Category 3 plan Danielle Shaw has been instructed to work up to a goal of 150 minutes of combined cardio and strengthening exercise per week for weight loss and overall health benefits. We discussed the following Behavioral Modification Strategies today: planning for success, increasing lean protein intake, increasing vegetables and work on meal planning and easy cooking plans  Danielle Shaw has agreed to follow up with our clinic in 2 weeks. She was informed of the importance of frequent follow up visits to maximize her success with intensive lifestyle modifications for her multiple health conditions.   OBESITY BEHAVIORAL INTERVENTION VISIT  Today's visit was # 4 out of 22.  Starting weight: 335 lbs Starting date:  12/08/17 Today's weight : 313  lbs Today's date: 01/24/2018 Total lbs lost to date: 22 (Patients must lose 7 lbs in the first 6 months to continue with counseling)   ASK: We discussed the diagnosis of obesity with Danielle Shaw today and Danielle Shaw agreed to give [place] permission to discuss obesity behavioral modification therapy today.  ASSESS: Danielle Shaw has the diagnosis of obesity and her BMI today is 52.09 Danielle Shaw is in the action stage of change   ADVISE: Danielle Shaw was educated on the multiple health risks of obesity as well as the benefit of weight loss to improve her health. She was advised of the need for long term treatment and the importance of lifestyle modifications.  AGREE: Multiple dietary modification options and treatment options were discussed and  Danielle Shaw agreed to the above obesity treatment plan.  I, Doreene Nest, am acting as transcriptionist for Eber Jones, MD  I have reviewed the above documentation for accuracy and completeness, and I agree with the above. - Ilene Qua, MD

## 2018-02-02 DIAGNOSIS — G4733 Obstructive sleep apnea (adult) (pediatric): Secondary | ICD-10-CM | POA: Diagnosis not present

## 2018-02-10 ENCOUNTER — Ambulatory Visit (INDEPENDENT_AMBULATORY_CARE_PROVIDER_SITE_OTHER): Payer: BLUE CROSS/BLUE SHIELD | Admitting: Family Medicine

## 2018-02-10 ENCOUNTER — Other Ambulatory Visit: Payer: Self-pay | Admitting: *Deleted

## 2018-02-10 ENCOUNTER — Other Ambulatory Visit: Payer: Self-pay | Admitting: Internal Medicine

## 2018-02-10 ENCOUNTER — Encounter: Payer: Self-pay | Admitting: Neurology

## 2018-02-10 VITALS — BP 137/83 | HR 62 | Temp 97.8°F | Ht 65.0 in | Wt 313.0 lb

## 2018-02-10 DIAGNOSIS — M79642 Pain in left hand: Principal | ICD-10-CM

## 2018-02-10 DIAGNOSIS — M79641 Pain in right hand: Secondary | ICD-10-CM

## 2018-02-10 DIAGNOSIS — Z9189 Other specified personal risk factors, not elsewhere classified: Secondary | ICD-10-CM | POA: Diagnosis not present

## 2018-02-10 DIAGNOSIS — R7303 Prediabetes: Secondary | ICD-10-CM | POA: Diagnosis not present

## 2018-02-10 DIAGNOSIS — Z6841 Body Mass Index (BMI) 40.0 and over, adult: Secondary | ICD-10-CM

## 2018-02-10 DIAGNOSIS — I10 Essential (primary) hypertension: Secondary | ICD-10-CM

## 2018-02-10 MED ORDER — ROSUVASTATIN CALCIUM 20 MG PO TABS: 20.0000 mg | ORAL_TABLET | Freq: Every day | ORAL | 0 refills | Status: DC

## 2018-02-10 MED ORDER — LOSARTAN POTASSIUM-HCTZ 50-12.5 MG PO TABS: 1.0000 | ORAL_TABLET | Freq: Every day | ORAL | 0 refills | Status: DC

## 2018-02-14 NOTE — Progress Notes (Signed)
Office: [phone]  /  Fax: [phone]   HPI:   Chief Complaint: OBESITY Danielle Shaw is here to discuss her progress with her obesity treatment plan. She is on the keep a food journal with 450 to 600 calories and 40 grams of protein at supper daily and the Category 3 plan and is following her eating plan approximately 70 % of the time. She states she is exercising 0 minutes 0 times per week. Danielle Shaw went to a few cookouts and financially couldn't go to the store. She had a few days of minimal appetite. Her weight is (!) 313 lb (142 kg) today and has maintained weight over a period of 2 weeks since her last visit. She has lost 22 lbs since starting treatment with [place].  Hypertension Danielle Shaw is a 47 y.o. female with hypertension.  Danielle Shaw denies chest pain, chest pressure or headache. She is working weight loss to help control her blood pressure with the goal of decreasing her risk of heart attack and stroke. Danielle Shaw blood pressure is controlled today.  At risk for cardiovascular disease Danielle Shaw is at a higher than average risk for cardiovascular disease due to obesity. She currently denies any chest pain.  Pre-Diabetes Danielle Shaw has a diagnosis of prediabetes based on her elevated Hgb A1c and was informed this puts her at greater risk of developing diabetes. She  Denies GI side effects of metformin. Danielle Shaw continues to work on diet and exercise to decrease risk of diabetes. She admits carb cravings and indulgences. She denies hypoglycemia.  ALLERGIES: Allergies  Allergen Reactions  . Latex   . Lisinopril Cough  . Metoprolol Other (See Comments)    Funny feeling    MEDICATIONS: Current Outpatient Medications on File Prior to Visit  Medication Sig Dispense Refill  . albuterol (PROVENTIL HFA;VENTOLIN HFA) 108 (90 Base) MCG/ACT inhaler INHALE TWO PUFFS BY MOUTH EVERY 6 HOURS AS NEEDED FOR WHEEZING OR SHORTNESS OF BREATH 18 each 1  . aspirin 81 MG tablet Take 81 mg by mouth daily.      . cyclobenzaprine (FLEXERIL) 5 MG tablet Take 1 tablet (5 mg total) by mouth 3 (three) times daily as needed for muscle spasms. 30 tablet 1  . HYDROcodone-homatropine (HYCODAN) 5-1.5 MG/5ML syrup Take 5-10 mLs by mouth at bedtime as needed for cough. 50 mL 0  . ipratropium (ATROVENT) 0.03 % nasal spray Place 2 sprays into both nostrils every 12 (twelve) hours. 30 mL 12  . levocetirizine (XYZAL) 5 MG tablet Take 5 mg by mouth every evening.    . meloxicam (MOBIC) 15 MG tablet Take 1 tablet (15 mg total) by mouth daily as needed for pain. 30 tablet 1  . metFORMIN (GLUCOPHAGE) 500 MG tablet Take 1 tablet (500 mg total) by mouth daily with breakfast. 30 tablet 0  . modafinil (PROVIGIL) 100 MG tablet Use when sleepy at work or driving , do not use within 4 hours of intended bedtime. 30 tablet 0  . pantoprazole (PROTONIX) 40 MG tablet Take 1 tablet (40 mg total) by mouth daily. 90 tablet 3  . traMADol (ULTRAM) 50 MG tablet Take 1 tablet (50 mg total) by mouth every 8 (eight) hours as needed. 30 tablet 0  . Vitamin D, Ergocalciferol, (DRISDOL) 50000 units CAPS capsule Take 1 capsule (50,000 Units total) by mouth every 7 (seven) days. 12 capsule 0   No current facility-administered medications on file prior to visit.     PAST MEDICAL HISTORY: Past Medical History:  Diagnosis  Date  . Arrhythmia   . Arthritis   . GERD (gastroesophageal reflux disease)   . Hyperlipidemia   . Hypertension   . IUD 2004  . IUD 2009  . Migraine   . SOB (shortness of breath)     PAST SURGICAL HISTORY: Past Surgical History:  Procedure Laterality Date  . BREAST BIOPSY Right 2011  . BREAST SURGERY  2000   reduction  . CESAREAN SECTION      SOCIAL HISTORY: Social History   Tobacco Use  . Smoking status: Never Smoker  . Smokeless tobacco: Never Used  Substance Use Topics  . Alcohol use: No    Alcohol/week: 0.0 oz  . Drug use: No    FAMILY HISTORY: Family History  Problem Relation Age of Onset  .  Heart disease Mother 25  . High Cholesterol Mother   . High blood pressure Mother   . Diabetes Father   . Heart disease Father   . High blood pressure Father   . High Cholesterol Father   . Heart disease Maternal Grandmother   . Hodgkin's lymphoma Daughter     ROS: Review of Systems  Constitutional: Negative for weight loss.  Cardiovascular: Negative for chest pain.       Negative for chest pressure  Gastrointestinal: Negative for diarrhea, nausea and vomiting.  Neurological: Negative for headaches.  Endo/Heme/Allergies:       Positive for carb cravings Negative for hypoglycemia    PHYSICAL EXAM: Blood pressure 137/83, pulse 62, temperature 97.8 F (36.6 C), temperature source Oral, height 5\' 5"  (1.651 m), weight (!) 313 lb (142 kg), SpO2 99 %. Body mass index is 52.09 kg/m. Physical Exam  Constitutional: She is oriented to person, place, and time. She appears well-developed and well-nourished.  Cardiovascular: Normal rate.  Pulmonary/Chest: Effort normal.  Musculoskeletal: Normal range of motion.  Neurological: She is oriented to person, place, and time.  Skin: Skin is warm and dry.  Psychiatric: She has a normal mood and affect. Her behavior is normal.  Vitals reviewed.   RECENT LABS AND TESTS: BMET    Component Value Date/Time   NA 137 12/14/2017 2303   NA 139 12/08/2017 1201   K 3.5 12/14/2017 2303   CL 101 12/14/2017 2303   CO2 26 12/14/2017 2303   GLUCOSE 101 (H) 12/14/2017 2303   BUN 13 12/14/2017 2303   BUN 10 12/08/2017 1201   CREATININE 0.78 12/14/2017 2303   CREATININE 0.64 02/01/2014 1005   CALCIUM 9.4 12/14/2017 2303   GFRNONAA >60 12/14/2017 2303   GFRAA >60 12/14/2017 2303   Lab Results  Component Value Date   HGBA1C 6.2 (H) 12/08/2017   HGBA1C 6.4 12/24/2016   HGBA1C 6.0 01/21/2016   HGBA1C 6.0 05/03/2015   Lab Results  Component Value Date   INSULIN 20.9 12/08/2017   CBC    Component Value Date/Time   WBC 5.2 12/14/2017 2303    RBC 4.65 12/14/2017 2303   HGB 13.7 12/14/2017 2303   HGB 12.9 12/08/2017 1201   HCT 40.4 12/14/2017 2303   HCT 39.5 12/08/2017 1201   PLT 379 12/14/2017 2303   MCV 86.9 12/14/2017 2303   MCV 89 12/08/2017 1201   MCH 29.5 12/14/2017 2303   MCHC 33.9 12/14/2017 2303   RDW 13.7 12/14/2017 2303   RDW 15.0 12/08/2017 1201   LYMPHSABS 2.0 12/08/2017 1201   MONOABS 0.3 02/01/2014 1005   EOSABS 0.4 12/08/2017 1201   BASOSABS 0.0 12/08/2017 1201   Iron/TIBC/Ferritin/ %Sat No  results found for: IRON, TIBC, FERRITIN, IRONPCTSAT Lipid Panel     Component Value Date/Time   CHOL 214 (H) 12/08/2017 1201   TRIG 143 12/08/2017 1201   HDL 41 12/08/2017 1201   CHOLHDL 7 12/24/2016 1142   VLDL 27.4 12/24/2016 1142   LDLCALC 144 (H) 12/08/2017 1201   LDLDIRECT 177 (H) 10/28/2010 0916   Hepatic Function Panel     Component Value Date/Time   PROT 7.4 12/08/2017 1201   ALBUMIN 4.2 12/08/2017 1201   AST 18 12/08/2017 1201   ALT 11 12/08/2017 1201   ALKPHOS 114 12/08/2017 1201   BILITOT 0.4 12/08/2017 1201      Component Value Date/Time   TSH 1.310 12/08/2017 1201   TSH 1.125 02/01/2014 1005   TSH 1.310 05/06/2011 1131   Results for WANDALEE, KLANG (MRN [id number]) as of 02/14/2018 11:23  Ref. Range 12/08/2017 12:01  Vitamin D, 25-Hydroxy Latest Ref Range: 30.0 - 100.0 ng/mL 4.6 (L)   ASSESSMENT AND PLAN: Essential hypertension - Plan: losartan-hydrochlorothiazide (HYZAAR) 50-12.5 MG tablet, rosuvastatin (CRESTOR) 20 MG tablet  Prediabetes  At risk for heart disease  Class 3 severe obesity with serious comorbidity and body mass index (BMI) of 50.0 to 59.9 in adult, unspecified obesity type ([place])  PLAN:  Hypertension We discussed sodium restriction, working on healthy weight loss, and a regular exercise program as the means to achieve improved blood pressure control. Carsen agreed with this plan and agreed to follow up as directed. We will continue to monitor her blood pressure as  well as her progress with the above lifestyle modifications. She will continue her medications as prescribed and will watch for signs of hypotension as she continues her lifestyle modifications.  Cardiovascular risk counseling Danielle Shaw was given extended (15 minutes) coronary artery disease prevention counseling today. She is 47 y.o. female and has risk factors for heart disease including obesity and hypertension. We discussed intensive lifestyle modifications today with an emphasis on specific weight loss instructions and strategies. Pt was also informed of the importance of increasing exercise and decreasing saturated fats to help prevent heart disease.  Pre-Diabetes Danielle Shaw will continue to work on weight loss, exercise, and decreasing simple carbohydrates in her diet to help decrease the risk of diabetes. We dicussed metformin including benefits and risks. She was informed that eating too many simple carbohydrates or too many calories at one sitting increases the likelihood of GI side effects. Danielle Shaw will continue metformin and follow up with [place] as directed to monitor her progress.  Obesity Danielle Shaw is currently in the action stage of change. As such, her goal is to continue with weight loss efforts She has agreed to follow the Category 3 plan Danielle Shaw has been instructed to work up to a goal of 150 minutes of combined cardio and strengthening exercise per week for weight loss and overall health benefits. We discussed the following Behavioral Modification Strategies today: planning for success, increasing lean protein intake, increasing vegetables and work on meal planning and easy cooking plans  Danielle Shaw has agreed to follow up with our clinic in 2 weeks. She was informed of the importance of frequent follow up visits to maximize her success with intensive lifestyle modifications for her multiple health conditions.   OBESITY BEHAVIORAL INTERVENTION VISIT  Today's visit was # 5 out of 22.  Starting  weight: 335 lbs Starting date: 12/08/17 Today's weight : 313 lbs Today's date: 02/10/2018 Total lbs lost to date: 22    ASK: We discussed the diagnosis of  obesity with Danielle Shaw today and Danielle Shaw agreed to give [place] permission to discuss obesity behavioral modification therapy today.  ASSESS: Danielle Shaw has the diagnosis of obesity and her BMI today is 52.09 Danielle Shaw is in the action stage of change   ADVISE: Danielle Shaw was educated on the multiple health risks of obesity as well as the benefit of weight loss to improve her health. She was advised of the need for long term treatment and the importance of lifestyle modifications.  AGREE: Multiple dietary modification options and treatment options were discussed and  Danielle Shaw agreed to the above obesity treatment plan.  I, Doreene Nest, am acting as transcriptionist for Eber Jones, MD  I have reviewed the above documentation for accuracy and completeness, and I agree with the above. - Ilene Qua, MD

## 2018-02-15 NOTE — Progress Notes (Deleted)
[facility] [place], [place] [address] Phone: [phone] Subjective:    I'm seeing this patient by the request  of:    CC:   [id number]  ALEX MCMANIGAL is a 47 y.o. female coming in with complaint of ***  Onset-  Location Duration-  Character- Aggravating factors- Reliving factors-  Therapies tried-  Severity-     Past Medical History:  Diagnosis Date  . Arrhythmia   . Arthritis   . GERD (gastroesophageal reflux disease)   . Hyperlipidemia   . Hypertension   . IUD 2004  . IUD 2009  . Migraine   . SOB (shortness of breath)    Past Surgical History:  Procedure Laterality Date  . BREAST BIOPSY Right 2011  . BREAST SURGERY  2000   reduction  . CESAREAN SECTION     Social History   Socioeconomic History  . Marital status: Significant Other    Spouse name: Not on file  . Number of children: 2  . Years of education: Not on file  . Highest education level: Not on file  Occupational History  . Not on file  Social Needs  . Financial resource strain: Not on file  . Food insecurity:    Worry: Not on file    Inability: Not on file  . Transportation needs:    Medical: Not on file    Non-medical: Not on file  Tobacco Use  . Smoking status: Never Smoker  . Smokeless tobacco: Never Used  Substance and Sexual Activity  . Alcohol use: No    Alcohol/week: 0.0 oz  . Drug use: No  . Sexual activity: Yes    Partners: Male  Lifestyle  . Physical activity:    Days per week: Not on file    Minutes per session: Not on file  . Stress: Not on file  Relationships  . Social connections:    Talks on phone: Not on file    Gets together: Not on file    Attends religious service: Not on file    Active member of club or organization: Not on file    Attends meetings of clubs or organizations: Not on file    Relationship status: Not on file  Other Topics Concern  . Not on file  Social History Narrative  . Not on file    Allergies  Allergen Reactions  . Latex   . Lisinopril Cough  . Metoprolol Other (See Comments)    Funny feeling   Family History  Problem Relation Age of Onset  . Heart disease Mother 38  . High Cholesterol Mother   . High blood pressure Mother   . Diabetes Father   . Heart disease Father   . High blood pressure Father   . High Cholesterol Father   . Heart disease Maternal Grandmother   . Hodgkin's lymphoma Daughter      Past medical history, social, surgical and family history all reviewed in electronic medical record.  No pertanent information unless stated regarding to the chief complaint.   Review of Systems:Review of systems updated and as accurate as of 02/15/18  No headache, visual changes, nausea, vomiting, diarrhea, constipation, dizziness, abdominal pain, skin rash, fevers, chills, night sweats, weight loss, swollen lymph nodes, body aches, joint swelling, muscle aches, chest pain, shortness of breath, mood changes.   Objective  There were no vitals taken for this visit. Systems examined below as of 02/15/18   General: No apparent  distress alert and oriented x3 mood and affect normal, dressed appropriately.  HEENT: Pupils equal, extraocular movements intact  Respiratory: Patient's speak in full sentences and does not appear short of breath  Cardiovascular: No lower extremity edema, non tender, no erythema  Skin: Warm dry intact with no signs of infection or rash on extremities or on axial skeleton.  Abdomen: Soft nontender  Neuro: Cranial nerves II through XII are intact, neurovascularly intact in all extremities with 2+ DTRs and 2+ pulses.  Lymph: No lymphadenopathy of posterior or anterior cervical chain or axillae bilaterally.  Gait normal with good balance and coordination.  MSK:  Non tender with full range of motion and good stability and symmetric strength and tone of shoulders, elbows, wrist, hip, knee and ankles bilaterally.     Impression and  Recommendations:     This case required medical decision making of moderate complexity.      Note: This dictation was prepared with Dragon dictation along with smaller phrase technology. Any transcriptional errors that result from this process are unintentional.

## 2018-02-16 ENCOUNTER — Ambulatory Visit: Payer: BLUE CROSS/BLUE SHIELD | Admitting: Family Medicine

## 2018-02-18 ENCOUNTER — Other Ambulatory Visit: Payer: Self-pay | Admitting: Neurology

## 2018-02-18 ENCOUNTER — Other Ambulatory Visit (INDEPENDENT_AMBULATORY_CARE_PROVIDER_SITE_OTHER): Payer: Self-pay | Admitting: Family Medicine

## 2018-02-18 DIAGNOSIS — I10 Essential (primary) hypertension: Secondary | ICD-10-CM

## 2018-02-21 ENCOUNTER — Other Ambulatory Visit: Payer: Self-pay | Admitting: Neurology

## 2018-02-21 MED ORDER — MODAFINIL 100 MG PO TABS: ORAL_TABLET | ORAL | 0 refills | Status: DC

## 2018-02-24 ENCOUNTER — Telehealth: Payer: Self-pay

## 2018-02-24 NOTE — Telephone Encounter (Signed)
All [facility] on back order and have no end date of when the medication will be back in stock. Gave verbals to split the medication

## 2018-02-24 NOTE — Telephone Encounter (Signed)
Copied from [place] [id number]. Topic: General - Other >> Feb 24, 2018 10:28 AM Carolyn Stare wrote:  Pt call to say the below med is on back order but they can break up will need a new RX      losartan-hydrochlorothiazide (HYZAAR) 50-12.5 MG tablet  [facility] [address]

## 2018-02-28 ENCOUNTER — Encounter (INDEPENDENT_AMBULATORY_CARE_PROVIDER_SITE_OTHER): Payer: Self-pay

## 2018-02-28 ENCOUNTER — Ambulatory Visit (INDEPENDENT_AMBULATORY_CARE_PROVIDER_SITE_OTHER): Payer: BLUE CROSS/BLUE SHIELD | Admitting: Family Medicine

## 2018-02-28 VITALS — BP 138/82 | HR 64 | Temp 98.2°F | Ht 65.0 in | Wt 307.0 lb

## 2018-02-28 DIAGNOSIS — R7303 Prediabetes: Secondary | ICD-10-CM

## 2018-02-28 DIAGNOSIS — I10 Essential (primary) hypertension: Secondary | ICD-10-CM | POA: Diagnosis not present

## 2018-02-28 DIAGNOSIS — Z6841 Body Mass Index (BMI) 40.0 and over, adult: Secondary | ICD-10-CM

## 2018-02-28 NOTE — Progress Notes (Signed)
Office: [phone]  /  Fax: [phone]   HPI:   Chief Complaint: OBESITY Danielle Shaw is here to discuss her progress with her obesity treatment plan. She is on the  follow the Category 3 plan and is following her eating plan approximately 90 % of the time. She states she is doing cardio and weight training for 10 minutes 3 times per week. Akayla enjoyed a slice of cake for daughter's birthday. She notes cravings. Family still following plan.  Her weight is (!) 307 lb (139.3 kg) today and has had a weight loss of 6 pounds over a period of 2 to 3 weeks since her last visit. She has lost 28 lbs since starting treatment with [place].  Pre-Diabetes Danielle Shaw has a diagnosis of pre-diabetes based on her elevated Hgb A1c and was informed this puts her at greater risk of developing diabetes. She is having some GI issues currently but history sounds like gastroenteritis. She is taking metformin currently and continues to work on diet and exercise to decrease risk of diabetes. She denies nausea or hypoglycemia.  Hypertension Danielle Shaw is a 47 y.o. female with hypertension. [name] is on split medications, blood pressure is controlled and she denies chest pain. She is working weight loss to help control her blood pressure with the goal of decreasing her risk of heart attack and stroke.  ALLERGIES: Allergies  Allergen Reactions  . Latex   . Lisinopril Cough  . Metoprolol Other (See Comments)    Funny feeling    MEDICATIONS: Current Outpatient Medications on File Prior to Visit  Medication Sig Dispense Refill  . albuterol (PROVENTIL HFA;VENTOLIN HFA) 108 (90 Base) MCG/ACT inhaler INHALE TWO PUFFS BY MOUTH EVERY 6 HOURS AS NEEDED FOR WHEEZING OR SHORTNESS OF BREATH 18 each 1  . aspirin 81 MG tablet Take 81 mg by mouth daily.     . cyclobenzaprine (FLEXERIL) 5 MG tablet Take 1 tablet (5 mg total) by mouth 3 (three) times daily as needed for muscle spasms. 30 tablet 1  . HYDROcodone-homatropine (HYCODAN)  5-1.5 MG/5ML syrup Take 5-10 mLs by mouth at bedtime as needed for cough. 50 mL 0  . ipratropium (ATROVENT) 0.03 % nasal spray Place 2 sprays into both nostrils every 12 (twelve) hours. 30 mL 12  . levocetirizine (XYZAL) 5 MG tablet Take 5 mg by mouth every evening.    [name] losartan-hydrochlorothiazide (HYZAAR) 50-12.5 MG tablet Take 1 tablet by mouth daily. 30 tablet 0  . meloxicam (MOBIC) 15 MG tablet Take 1 tablet (15 mg total) by mouth daily as needed for pain. 30 tablet 1  . metFORMIN (GLUCOPHAGE) 500 MG tablet Take 1 tablet (500 mg total) by mouth daily with breakfast. 30 tablet 0  . modafinil (PROVIGIL) 100 MG tablet Use when sleepy at work or driving , do not use within 4 hours of intended bedtime. 30 tablet 0  . pantoprazole (PROTONIX) 40 MG tablet Take 1 tablet (40 mg total) by mouth daily. 90 tablet 3  . rosuvastatin (CRESTOR) 20 MG tablet Take 1 tablet (20 mg total) by mouth daily. 30 tablet 0  . traMADol (ULTRAM) 50 MG tablet Take 1 tablet (50 mg total) by mouth every 8 (eight) hours as needed. 30 tablet 0  . Vitamin D, Ergocalciferol, (DRISDOL) 50000 units CAPS capsule Take 1 capsule (50,000 Units total) by mouth every 7 (seven) days. 12 capsule 0   No current facility-administered medications on file prior to visit.     PAST MEDICAL HISTORY: Past Medical  History:  Diagnosis Date  . Arrhythmia   . Arthritis   . GERD (gastroesophageal reflux disease)   . Hyperlipidemia   . Hypertension   . IUD 2004  . IUD 2009  . Migraine   . SOB (shortness of breath)     PAST SURGICAL HISTORY: Past Surgical History:  Procedure Laterality Date  . BREAST BIOPSY Right 2011  . BREAST SURGERY  2000   reduction  . CESAREAN SECTION      SOCIAL HISTORY: Social History   Tobacco Use  . Smoking status: Never Smoker  . Smokeless tobacco: Never Used  Substance Use Topics  . Alcohol use: No    Alcohol/week: 0.0 oz  . Drug use: No    FAMILY HISTORY: Family History  Problem Relation  Age of Onset  . Heart disease Mother 55  . High Cholesterol Mother   . High blood pressure Mother   . Diabetes Father   . Heart disease Father   . High blood pressure Father   . High Cholesterol Father   . Heart disease Maternal Grandmother   . Hodgkin's lymphoma Daughter     ROS: Review of Systems  Constitutional: Positive for weight loss.  Cardiovascular: Negative for chest pain.  Gastrointestinal: Negative for nausea.  Endo/Heme/Allergies:       Negative hypoglycemia    PHYSICAL EXAM: Blood pressure 138/82, pulse 64, temperature 98.2 F (36.8 C), temperature source Oral, height 5\' 5"  (1.651 m), weight (!) 307 lb (139.3 kg), SpO2 97 %. Body mass index is 51.09 kg/m. Physical Exam  Constitutional: She is oriented to person, place, and time. She appears well-developed and well-nourished.  Cardiovascular: Normal rate.  Pulmonary/Chest: Effort normal.  Musculoskeletal: Normal range of motion.  Neurological: She is oriented to person, place, and time.  Skin: Skin is warm and dry.  Psychiatric: She has a normal mood and affect. Her behavior is normal.  Vitals reviewed.   RECENT LABS AND TESTS: BMET    Component Value Date/Time   NA 137 12/14/2017 2303   NA 139 12/08/2017 1201   K 3.5 12/14/2017 2303   CL 101 12/14/2017 2303   CO2 26 12/14/2017 2303   GLUCOSE 101 (H) 12/14/2017 2303   BUN 13 12/14/2017 2303   BUN 10 12/08/2017 1201   CREATININE 0.78 12/14/2017 2303   CREATININE 0.64 02/01/2014 1005   CALCIUM 9.4 12/14/2017 2303   GFRNONAA >60 12/14/2017 2303   GFRAA >60 12/14/2017 2303   Lab Results  Component Value Date   HGBA1C 6.2 (H) 12/08/2017   HGBA1C 6.4 12/24/2016   HGBA1C 6.0 01/21/2016   HGBA1C 6.0 05/03/2015   Lab Results  Component Value Date   INSULIN 20.9 12/08/2017   CBC    Component Value Date/Time   WBC 5.2 12/14/2017 2303   RBC 4.65 12/14/2017 2303   HGB 13.7 12/14/2017 2303   HGB 12.9 12/08/2017 1201   HCT 40.4 12/14/2017 2303    HCT 39.5 12/08/2017 1201   PLT 379 12/14/2017 2303   MCV 86.9 12/14/2017 2303   MCV 89 12/08/2017 1201   MCH 29.5 12/14/2017 2303   MCHC 33.9 12/14/2017 2303   RDW 13.7 12/14/2017 2303   RDW 15.0 12/08/2017 1201   LYMPHSABS 2.0 12/08/2017 1201   MONOABS 0.3 02/01/2014 1005   EOSABS 0.4 12/08/2017 1201   BASOSABS 0.0 12/08/2017 1201   Iron/TIBC/Ferritin/ %Sat No results found for: IRON, TIBC, FERRITIN, IRONPCTSAT Lipid Panel     Component Value Date/Time   CHOL 214 (  H) 12/08/2017 1201   TRIG 143 12/08/2017 1201   HDL 41 12/08/2017 1201   CHOLHDL 7 12/24/2016 1142   VLDL 27.4 12/24/2016 1142   LDLCALC 144 (H) 12/08/2017 1201   LDLDIRECT 177 (H) 10/28/2010 0916   Hepatic Function Panel     Component Value Date/Time   PROT 7.4 12/08/2017 1201   ALBUMIN 4.2 12/08/2017 1201   AST 18 12/08/2017 1201   ALT 11 12/08/2017 1201   ALKPHOS 114 12/08/2017 1201   BILITOT 0.4 12/08/2017 1201      Component Value Date/Time   TSH 1.310 12/08/2017 1201   TSH 1.125 02/01/2014 1005   TSH 1.310 05/06/2011 1131    ASSESSMENT AND PLAN: Prediabetes  Essential hypertension  Class 3 severe obesity with serious comorbidity and body mass index (BMI) of 50.0 to 59.9 in adult, unspecified obesity type ([place])  PLAN:  Pre-Diabetes Samyiah will continue to work on weight loss, exercise, and decreasing simple carbohydrates in her diet to help decrease the risk of diabetes. We dicussed metformin including benefits and risks. She was informed that eating too many simple carbohydrates or too many calories at one sitting increases the likelihood of GI side effects. Khyleigh agrees to continue taking metformin, no refill needed, and she agrees to follow up with our clinic in 2 weeks as directed to monitor her progress.  Hypertension We discussed sodium restriction, working on healthy weight loss, and a regular exercise program as the means to achieve improved blood pressure control. Maryse agreed with this  plan and agreed to follow up as directed. We will continue to monitor her blood pressure as well as her progress with the above lifestyle modifications. She will continue her current medications and will watch for signs of hypotension as she continues her lifestyle modifications. Tressia agrees to follow up with our clinic in 2 weeks.  We spent > than 50% of the 15 minute visit on the counseling as documented in the note.  Obesity Naida is currently in the action stage of change. As such, her goal is to continue with weight loss efforts She has agreed to follow the Category 3 plan Chavonne has been instructed to work up to a goal of 150 minutes of combined cardio and strengthening exercise per week for weight loss and overall health benefits. We discussed the following Behavioral Modification Strategies today: increasing lean protein intake, increasing vegetables, work on meal planning and easy cooking plans, celebration eating strategies, and planning for success    Aastha has agreed to follow up with our clinic in 2 weeks. She was informed of the importance of frequent follow up visits to maximize her success with intensive lifestyle modifications for her multiple health conditions.   OBESITY BEHAVIORAL INTERVENTION VISIT  Today's visit was # 6 out of 22.  Starting weight: 335 lbs Starting date: 12/08/17 Today's weight : 307 lbs  Today's date: 02/28/2018 Total lbs lost to date: 80    ASK: We discussed the diagnosis of obesity with [name] today and Eldean agreed to give [place] permission to discuss obesity behavioral modification therapy today.  ASSESS: Naryah has the diagnosis of obesity and her BMI today is 51.09 Deitra is in the action stage of change   ADVISE: Keyri was educated on the multiple health risks of obesity as well as the benefit of weight loss to improve her health. She was advised of the need for long term treatment and the importance of lifestyle  modifications.  AGREE: Multiple dietary modification  options and treatment options were discussed and  Annitta agreed to the above obesity treatment plan.  I, Trixie Dredge, am acting as transcriptionist for Ilene Qua, MD  I have reviewed the above documentation for accuracy and completeness, and I agree with the above. - Ilene Qua, MD

## 2018-03-01 ENCOUNTER — Ambulatory Visit (INDEPENDENT_AMBULATORY_CARE_PROVIDER_SITE_OTHER): Payer: BLUE CROSS/BLUE SHIELD | Admitting: Neurology

## 2018-03-01 DIAGNOSIS — M5412 Radiculopathy, cervical region: Secondary | ICD-10-CM

## 2018-03-01 DIAGNOSIS — M79641 Pain in right hand: Secondary | ICD-10-CM | POA: Diagnosis not present

## 2018-03-01 DIAGNOSIS — M79642 Pain in left hand: Secondary | ICD-10-CM | POA: Diagnosis not present

## 2018-03-01 DIAGNOSIS — G5602 Carpal tunnel syndrome, left upper limb: Secondary | ICD-10-CM

## 2018-03-01 NOTE — Procedures (Signed)
[facility]  [address] [place], [place]  [place], [place] [address] Tel: [phone] Fax:  [phone] Test Date:  03/01/2018  Patient: Danielle Shaw DOB: Jul 31, 1970 Physician: [name], DO  Sex: Female Height: 5\' 5"  Ref Phys: Charlotte Crumb, MD  ID#: [id number] Temp: 36.0C Technician:    Patient Complaints: This is a 47 year old female referred for evaluation of bilateral hand paresthesias, worse on the left.  NCV & EMG Findings: Extensive electrodiagnostic testing of the left upper extremity and additional studies of the right shows:  1. Left median sensory response shows prolonged distal peak latency (3.7 ms) and reduced amplitude (11.7 V).  Right median, right mixed palmer, and bilateral ulnar sensory responses are within normal limits.  2. Bilateral median and ulnar motor responses are within normal limits. 3. Chronic motor axon loss changes are seen affecting the C8 myotomes bilaterally, without accompanied active denervation.  Impression: 1. Left median neuropathy at or distal to the wrist, consistent with the clinical diagnosis of carpal tunnel syndrome. Overall, these findings are moderate in degree electrically. 2. 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   ___________________________ [name], DO    Nerve Conduction Studies Anti Sensory Summary Table   Site NR Peak (ms) Norm Peak (ms) P-T Amp (V) Norm P-T Amp  Left Median Anti Sensory (2nd Digit)  36C  Wrist    3.7 <3.4 11.7 >20  Right Median Anti Sensory (2nd Digit)  36C  Wrist    3.2 <3.4 29.4 >20  Left Ulnar Anti Sensory (5th Digit)  36C  Wrist    2.7 <3.1 20.4 >12  Right Ulnar Anti Sensory (5th Digit)  36C  Wrist    2.5 <3.1 21.7 >12   Motor Summary Table   Site NR Onset (ms) Norm Onset (ms) O-P Amp (mV) Norm O-P Amp Site1 Site2 Delta-0 (ms) Dist (cm) Vel (m/s) Norm Vel (m/s)  Left Median Motor (Abd Poll Brev)  36C  Wrist     3.8 <3.9 11.0 >6 Elbow Wrist 4.6 29.0 63 >50  Elbow    8.4  9.7         Right Median Motor (Abd Poll Brev)  36C  Wrist    3.2 <3.9 13.3 >6 Elbow Wrist 4.6 30.0 65 >50  Elbow    7.8  12.7         Left Ulnar Motor (Abd Dig Minimi)  36C  Wrist    2.2 <3.1 8.1 >7 B Elbow Wrist 4.0 25.0 63 >50  B Elbow    6.2  7.9  A Elbow B Elbow 1.8 10.0 56 >50  A Elbow    8.0  7.3         Right Ulnar Motor (Abd Dig Minimi)  36C  Wrist    2.0 <3.1 10.1 >7 B Elbow Wrist 4.0 25.0 63 >50  B Elbow    6.0  9.0  A Elbow B Elbow 1.8 10.0 56 >50  A Elbow    7.8  8.7          Comparison Summary Table   Site NR Peak (ms) Norm Peak (ms) P-T Amp (V) Site1 Site2 Delta-P (ms) Norm Delta (ms)  Right Median/Ulnar Palm Comparison (Wrist - 8cm)  36C  Median Palm    1.9 <2.2 16.5 Median Palm Ulnar Palm 0.3   Ulnar Palm    1.6 <2.2 11.5       EMG   Side Muscle Ins Act Fibs Psw Fasc  Number Recrt Dur Dur. Amp Amp. Poly Poly. Comment  Left Triceps Nml Nml Nml Nml 1- Rapid Some 1+ Some 1+ Nml Nml N/A  Left 1stDorInt Nml Nml Nml Nml 1- Rapid Some 1+ Some 1+ Nml Nml N/A  Left PronatorTeres Nml Nml Nml Nml Nml Nml Nml Nml Nml Nml Nml Nml N/A  Left Abd Poll Brev Nml Nml Nml Nml Nml Nml Nml Nml Nml Nml Nml Nml N/A  Left Deltoid Nml Nml Nml Nml Nml Nml Nml Nml Nml Nml Nml Nml N/A  Left Biceps Nml Nml Nml Nml Nml Nml Nml Nml Nml Nml Nml Nml N/A  Left Ext Indicis Nml Nml Nml Nml Nml Nml Nml Nml Nml Nml Nml Nml N/A  Right FlexCarpiUln Nml Nml Nml Nml 1- Rapid Some 1+ Some 1+ Nml Nml N/A  Right ABD Dig Min Nml Nml Nml Nml 2- Rapid Some 1+ Some 1+ Nml Nml N/A  Right 1stDorInt Nml Nml Nml Nml 2- Rapid Some 1+ Some 1+ Nml Nml N/A  Right Deltoid Nml Nml Nml Nml Nml Nml Nml Nml Nml Nml Nml Nml N/A  Right PronatorTeres Nml Nml Nml Nml Nml Nml Nml Nml Nml Nml Nml Nml N/A  Right Ext Indicis Nml Nml Nml Nml Nml Nml Nml Nml Nml Nml Nml Nml N/A  Right Biceps Nml Nml Nml Nml Nml Nml Nml Nml Nml Nml Nml Nml N/A  Right Triceps Nml Nml Nml  Nml 1- Rapid Some 1+ Some 1+ Nml Nml N/A      Waveforms:

## 2018-03-05 DIAGNOSIS — G4733 Obstructive sleep apnea (adult) (pediatric): Secondary | ICD-10-CM | POA: Diagnosis not present

## 2018-03-08 ENCOUNTER — Ambulatory Visit: Payer: BLUE CROSS/BLUE SHIELD | Admitting: Family Medicine

## 2018-03-10 DIAGNOSIS — G5603 Carpal tunnel syndrome, bilateral upper limbs: Secondary | ICD-10-CM | POA: Diagnosis not present

## 2018-03-14 ENCOUNTER — Other Ambulatory Visit (INDEPENDENT_AMBULATORY_CARE_PROVIDER_SITE_OTHER): Payer: Self-pay | Admitting: Family Medicine

## 2018-03-14 DIAGNOSIS — R7303 Prediabetes: Secondary | ICD-10-CM

## 2018-03-17 ENCOUNTER — Encounter (INDEPENDENT_AMBULATORY_CARE_PROVIDER_SITE_OTHER): Payer: Self-pay | Admitting: Family Medicine

## 2018-03-17 ENCOUNTER — Ambulatory Visit (INDEPENDENT_AMBULATORY_CARE_PROVIDER_SITE_OTHER): Payer: BLUE CROSS/BLUE SHIELD | Admitting: Family Medicine

## 2018-03-17 VITALS — BP 136/84 | HR 76 | Temp 97.7°F | Ht 65.0 in | Wt 316.0 lb

## 2018-03-17 DIAGNOSIS — Z6841 Body Mass Index (BMI) 40.0 and over, adult: Secondary | ICD-10-CM

## 2018-03-17 DIAGNOSIS — Z9189 Other specified personal risk factors, not elsewhere classified: Secondary | ICD-10-CM

## 2018-03-17 DIAGNOSIS — K5909 Other constipation: Secondary | ICD-10-CM | POA: Diagnosis not present

## 2018-03-17 DIAGNOSIS — E559 Vitamin D deficiency, unspecified: Secondary | ICD-10-CM

## 2018-03-17 DIAGNOSIS — R7303 Prediabetes: Secondary | ICD-10-CM | POA: Diagnosis not present

## 2018-03-17 MED ORDER — METFORMIN HCL 500 MG PO TABS: 500.0000 mg | ORAL_TABLET | Freq: Every day | ORAL | 0 refills | Status: DC

## 2018-03-17 NOTE — Progress Notes (Signed)
Office: [phone]  /  Fax: [phone]   HPI:   Chief Complaint: OBESITY Danielle Shaw is here to discuss her progress with her obesity treatment plan. She is on the Category 3 plan and is following her eating plan approximately 90 % of the time. She states she is walking 15 minutes 5 times per week. Danielle Shaw has had a rough couple of weeks. She is following the plan, but she is feeling constipated. Danielle Shaw is planning a trip September 5th through the 15th to [place]. Her weight is (!) 316 lb (143.3 kg) today and has not lost weight since her last visit. She has lost 19 lbs since starting treatment with 45 [place].  Constipation Danielle Shaw notes constipation for the last 45 weeks, worse since attempting weight loss. She has been taking benefiber daily. She denies hematochezia or melena.   Pre-Diabetes Danielle Shaw has a diagnosis of prediabetes based on her elevated Hgb A1c and was informed this puts her at greater risk of developing diabetes. She is taking metformin currently and continues to work on diet and exercise to decrease risk of diabetes. She has minimal occasional nausea and denies any hypoglycemia.  At risk for diabetes Danielle Shaw is at higher than average risk for developing diabetes due to her obesity and prediabetes. She currently denies polyuria or polydipsia.  Vitamin D deficiency Danielle Shaw has a diagnosis of vitamin D deficiency. Fredrika is currently taking vit D and she admits fatigue, but denies nausea, vomiting or muscle weakness.  ALLERGIES: Allergies  Allergen Reactions  . Latex   . Lisinopril Cough  . Metoprolol Other (See Comments)    Funny feeling    MEDICATIONS: Current Outpatient Medications on File Prior to Visit  Medication Sig Dispense Refill  . albuterol (PROVENTIL HFA;VENTOLIN HFA) 108 (90 Base) MCG/ACT inhaler INHALE TWO PUFFS BY MOUTH EVERY 6 HOURS AS NEEDED FOR WHEEZING OR SHORTNESS OF BREATH 18 each 1  . aspirin 81 MG tablet Take 81 mg by mouth daily.     . cyclobenzaprine  (FLEXERIL) 5 MG tablet Take 1 tablet (5 mg total) by mouth 3 (three) times daily as needed for muscle spasms. 30 tablet 1  . HYDROcodone-homatropine (HYCODAN) 5-1.5 MG/5ML syrup Take 5-10 mLs by mouth at bedtime as needed for cough. 50 mL 0  . ipratropium (ATROVENT) 0.03 % nasal spray Place 2 sprays into both nostrils every 12 (twelve) hours. 30 mL 12  . levocetirizine (XYZAL) 5 MG tablet Take 5 mg by mouth every evening.    [name] losartan-hydrochlorothiazide (HYZAAR) 50-12.5 MG tablet Take 1 tablet by mouth daily. 30 tablet 0  . meloxicam (MOBIC) 15 MG tablet Take 1 tablet (15 mg total) by mouth daily as needed for pain. 30 tablet 1  . modafinil (PROVIGIL) 100 MG tablet Use when sleepy at work or driving , do not use within 4 hours of intended bedtime. 30 tablet 0  . pantoprazole (PROTONIX) 40 MG tablet Take 1 tablet (40 mg total) by mouth daily. 90 tablet 3  . rosuvastatin (CRESTOR) 20 MG tablet Take 1 tablet (20 mg total) by mouth daily. 30 tablet 0  . traMADol (ULTRAM) 50 MG tablet Take 1 tablet (50 mg total) by mouth every 8 (eight) hours as needed. 30 tablet 0  . Vitamin D, Ergocalciferol, (DRISDOL) 50000 units CAPS capsule Take 1 capsule (50,000 Units total) by mouth every 7 (seven) days. 12 capsule 0   No current facility-administered medications on file prior to visit.     PAST MEDICAL HISTORY: Past Medical History:  Diagnosis Date  . Arrhythmia   . Arthritis   . GERD (gastroesophageal reflux disease)   . Hyperlipidemia   . Hypertension   . IUD 2004  . IUD 2009  . Migraine   . SOB (shortness of breath)     PAST SURGICAL HISTORY: Past Surgical History:  Procedure Laterality Date  . BREAST BIOPSY Right 2011  . BREAST SURGERY  2000   reduction  . CESAREAN SECTION      SOCIAL HISTORY: Social History   Tobacco Use  . Smoking status: Never Smoker  . Smokeless tobacco: Never Used  Substance Use Topics  . Alcohol use: No    Alcohol/week: 0.0 standard drinks  . Drug use: No     FAMILY HISTORY: Family History  Problem Relation Age of Onset  . Heart disease Mother 14  . High Cholesterol Mother   . High blood pressure Mother   . Diabetes Father   . Heart disease Father   . High blood pressure Father   . High Cholesterol Father   . Heart disease Maternal Grandmother   . Hodgkin's lymphoma Daughter     ROS: Review of Systems  Constitutional: Positive for malaise/fatigue. Negative for weight loss.  Gastrointestinal: Positive for constipation and nausea. Negative for melena and vomiting.       Negative for hematochezia  Genitourinary: Negative for frequency.  Musculoskeletal:       Negative for muscle weakness  Endo/Heme/Allergies: Negative for polydipsia.       Negative for hypoglycemia    PHYSICAL EXAM: Blood pressure 136/84, pulse 76, temperature 97.7 F (36.5 C), temperature source Oral, height 5\' 5"  (1.651 m), weight (!) 316 lb (143.3 kg), SpO2 98 %. Body mass index is 52.59 kg/m. Physical Exam  Constitutional: She is oriented to person, place, and time. She appears well-developed and well-nourished.  Cardiovascular: Normal rate.  Pulmonary/Chest: Effort normal.  Musculoskeletal: Normal range of motion.  Neurological: She is oriented to person, place, and time.  Skin: Skin is warm and dry.  Psychiatric: She has a normal mood and affect. Her behavior is normal.  Vitals reviewed.   RECENT LABS AND TESTS: BMET    Component Value Date/Time   NA 137 12/14/2017 2303   NA 139 12/08/2017 1201   K 3.5 12/14/2017 2303   CL 101 12/14/2017 2303   CO2 26 12/14/2017 2303   GLUCOSE 101 (H) 12/14/2017 2303   BUN 13 12/14/2017 2303   BUN 10 12/08/2017 1201   CREATININE 0.78 12/14/2017 2303   CREATININE 0.64 02/01/2014 1005   CALCIUM 9.4 12/14/2017 2303   GFRNONAA >60 12/14/2017 2303   GFRAA >60 12/14/2017 2303   Lab Results  Component Value Date   HGBA1C 6.2 (H) 12/08/2017   HGBA1C 6.4 12/24/2016   HGBA1C 6.0 01/21/2016   HGBA1C 6.0  05/03/2015   Lab Results  Component Value Date   INSULIN 20.9 12/08/2017   CBC    Component Value Date/Time   WBC 5.2 12/14/2017 2303   RBC 4.65 12/14/2017 2303   HGB 13.7 12/14/2017 2303   HGB 12.9 12/08/2017 1201   HCT 40.4 12/14/2017 2303   HCT 39.5 12/08/2017 1201   PLT 379 12/14/2017 2303   MCV 86.9 12/14/2017 2303   MCV 89 12/08/2017 1201   MCH 29.5 12/14/2017 2303   MCHC 33.9 12/14/2017 2303   RDW 13.7 12/14/2017 2303   RDW 15.0 12/08/2017 1201   LYMPHSABS 2.0 12/08/2017 1201   MONOABS 0.3 02/01/2014 1005   EOSABS 0.4  12/08/2017 1201   BASOSABS 0.0 12/08/2017 1201   Iron/TIBC/Ferritin/ %Sat No results found for: IRON, TIBC, FERRITIN, IRONPCTSAT Lipid Panel     Component Value Date/Time   CHOL 214 (H) 12/08/2017 1201   TRIG 143 12/08/2017 1201   HDL 41 12/08/2017 1201   CHOLHDL 7 12/24/2016 1142   VLDL 27.4 12/24/2016 1142   LDLCALC 144 (H) 12/08/2017 1201   LDLDIRECT 177 (H) 10/28/2010 0916   Hepatic Function Panel     Component Value Date/Time   PROT 7.4 12/08/2017 1201   ALBUMIN 4.2 12/08/2017 1201   AST 18 12/08/2017 1201   ALT 11 12/08/2017 1201   ALKPHOS 114 12/08/2017 1201   BILITOT 0.4 12/08/2017 1201      Component Value Date/Time   TSH 1.310 12/08/2017 1201   TSH 1.125 02/01/2014 1005   TSH 1.310 05/06/2011 1131   Results for KYLIN, GENNA (MRN [id number]) as of 03/17/2018 15:02  Ref. Range 12/08/2017 12:01  Vitamin D, 25-Hydroxy Latest Ref Range: 30.0 - 100.0 ng/mL 4.6 (L)   ASSESSMENT AND PLAN: Other constipation  Prediabetes - Plan: Hemoglobin A1c, Insulin, random, Lipid Panel With LDL/HDL Ratio, metFORMIN (GLUCOPHAGE) 500 MG tablet  Vitamin D deficiency - Plan: VITAMIN D 25 Hydroxy (Vit-D Deficiency, Fractures)  At risk for diabetes mellitus  Class 3 severe obesity with serious comorbidity and body mass index (BMI) of 50.0 to 59.9 in adult, unspecified obesity type (HCC)  PLAN:  Constipation Rubye was informed decrease  bowel movement frequency is normal while losing weight, but stools should not be hard or painful. She was advised to increase her H20 intake and work on increasing her fiber intake. High fiber foods were discussed today. Danielle Shaw agrees to start Miralax 17 grams daily #1 bottle with no refills and follow up as directed.  Pre-Diabetes Danielle Shaw will continue to work on weight loss, exercise, and decreasing simple carbohydrates in her diet to help decrease the risk of diabetes. We dicussed metformin including benefits and risks. She was informed that eating too many simple carbohydrates or too many calories at one sitting increases the likelihood of GI side effects. Danielle Shaw requested metformin for now and a prescription was written today for 1 month refill. We will check Hgb and insulin level today. Danielle Shaw agreed to follow up with [place] as directed to monitor her progress.  Diabetes risk counseling Danielle Shaw was given extended (15 minutes) diabetes prevention counseling today. She is 47 y.o. female and has risk factors for diabetes including obesity and prediabetes. We discussed intensive lifestyle modifications today with an emphasis on weight loss as well as increasing exercise and decreasing simple carbohydrates in her diet.  Vitamin D Deficiency Danielle Shaw was informed that low vitamin D levels contributes to fatigue and are associated with obesity, breast, and colon cancer. She agrees to continue to take prescription Vit D @50 ,000 IU every week and will follow up for routine testing of vitamin D, at least 2-3 times per year. She was informed of the risk of over-replacement of vitamin D and agrees to not increase her dose unless she discusses this with [place] first. We will check vitamin D level today and Danielle Shaw will follow up as directed.  Obesity Danielle Shaw is currently in the action stage of change. As such, her goal is to continue with weight loss efforts She has agreed to follow the Category 3 plan Danielle Shaw has been instructed  to work up to a goal of 150 minutes of combined cardio and strengthening exercise per week for  weight loss and overall health benefits. We discussed the following Behavioral Modification Strategies today: better snacking choices, planning for success, increasing lean protein intake, increasing vegetables and work on meal planning and easy cooking plans  Danielle Shaw has agreed to follow up with our clinic in 2 weeks. She was informed of the importance of frequent follow up visits to maximize her success with intensive lifestyle modifications for her multiple health conditions.   OBESITY BEHAVIORAL INTERVENTION VISIT  Today's visit was # 7   Starting weight: 335 lbs Starting date: 12/08/17 Today's weight : 316 lbs Today's date: 03/17/2018 Total lbs lost to date: 19 At least 15 minutes were spent on discussing the following behavioral intervention visit.   ASK: We discussed the diagnosis of obesity with Danielle Shaw today and Danielle Shaw agreed to give [place] permission to discuss obesity behavioral modification therapy today.  ASSESS: Danielle Shaw has the diagnosis of obesity and her BMI today is 52.59 Danielle Shaw is in the action stage of change   ADVISE: Danielle Shaw was educated on the multiple health risks of obesity as well as the benefit of weight loss to improve her health. She was advised of the need for long term treatment and the importance of lifestyle modifications to improve her current health and to decrease her risk of future health problems.  AGREE: Multiple dietary modification options and treatment options were discussed and  Danielle Shaw agreed to follow the recommendations documented in the above note.  ARRANGE: Danielle Shaw was educated on the importance of frequent visits to treat obesity as outlined per CMS and USPSTF guidelines and agreed to schedule her next follow up appointment today.  I, Doreene Nest, am acting as transcriptionist for Eber Jones, MD  I have reviewed the above documentation  for accuracy and completeness, and I agree with the above. - Ilene Qua, MD

## 2018-03-18 LAB — VITAMIN D 25 HYDROXY (VIT D DEFICIENCY, FRACTURES): VIT D 25 HYDROXY: 29.3 ng/mL — AB (ref 30.0–100.0)

## 2018-03-18 LAB — LIPID PANEL WITH LDL/HDL RATIO
Cholesterol, Total: 131 mg/dL (ref 100–199)
HDL: 38 mg/dL — ABNORMAL LOW (ref >39)
LDL CALC: 80 mg/dL (ref 0–99)
LDl/HDL Ratio: 2.1 ratio (ref 0.0–3.2)
Triglycerides: 63 mg/dL (ref 0–149)
VLDL CHOLESTEROL CAL: 13 mg/dL (ref 5–40)

## 2018-03-18 LAB — HEMOGLOBIN A1C
Est. average glucose Bld gHb Est-mCnc: 126 mg/dL
HEMOGLOBIN A1C: 6 % — AB (ref 4.8–5.6)

## 2018-03-18 LAB — INSULIN, RANDOM: INSULIN: 16.3 u[IU]/mL (ref 2.6–24.9)

## 2018-03-22 ENCOUNTER — Encounter: Payer: Self-pay | Admitting: Neurology

## 2018-03-30 ENCOUNTER — Ambulatory Visit: Payer: BLUE CROSS/BLUE SHIELD | Admitting: Neurology

## 2018-03-30 ENCOUNTER — Ambulatory Visit (INDEPENDENT_AMBULATORY_CARE_PROVIDER_SITE_OTHER): Payer: BLUE CROSS/BLUE SHIELD | Admitting: Bariatrics

## 2018-03-30 ENCOUNTER — Encounter: Payer: Self-pay | Admitting: Neurology

## 2018-03-30 VITALS — BP 154/82 | HR 62 | Ht 64.5 in | Wt 312.0 lb

## 2018-03-30 VITALS — BP 124/80 | HR 65 | Temp 97.8°F | Ht 65.0 in | Wt 306.0 lb

## 2018-03-30 DIAGNOSIS — G4733 Obstructive sleep apnea (adult) (pediatric): Secondary | ICD-10-CM | POA: Diagnosis not present

## 2018-03-30 DIAGNOSIS — G5603 Carpal tunnel syndrome, bilateral upper limbs: Secondary | ICD-10-CM | POA: Diagnosis not present

## 2018-03-30 DIAGNOSIS — Z6841 Body Mass Index (BMI) 40.0 and over, adult: Secondary | ICD-10-CM

## 2018-03-30 DIAGNOSIS — E559 Vitamin D deficiency, unspecified: Secondary | ICD-10-CM

## 2018-03-30 DIAGNOSIS — Z9989 Dependence on other enabling machines and devices: Secondary | ICD-10-CM

## 2018-03-30 DIAGNOSIS — G4726 Circadian rhythm sleep disorder, shift work type: Secondary | ICD-10-CM | POA: Insufficient documentation

## 2018-03-30 DIAGNOSIS — R7303 Prediabetes: Secondary | ICD-10-CM | POA: Diagnosis not present

## 2018-03-30 MED ORDER — MODAFINIL 200 MG PO TABS: 200.0000 mg | ORAL_TABLET | Freq: Every day | ORAL | 5 refills | Status: DC

## 2018-03-30 NOTE — Progress Notes (Signed)
Office: [phone]  /  Fax: [phone]   HPI:   Chief Complaint: OBESITY Danielle Shaw is here to discuss her progress with her obesity treatment plan. She is on the Category 3 plan and is following her eating plan approximately 95 % of the time. She states she is walking 10 minutes 3 times per week. Danielle Shaw is doing well with the category 3 plan. She denies any cravings and her hunger is minimal. Danielle Shaw is being more consistent with diet, exercise and weight loss. Her weight is (!) 306 lb (138.8 kg) today and has had a weight loss of 10 pounds over a period of 2 weeks since her last visit. She has lost 29 lbs since starting treatment with [place].  Pre-Diabetes Danielle Shaw has a diagnosis of prediabetes based on her elevated Hgb A1c and was informed this puts her at greater risk of developing diabetes. She is taking metformin and notes side effects of occasional diarrhea. Her last A1c was at 6.2 and her last insulin level was at 20.9 (12/08/17). Danielle Shaw continues to work on diet and exercise to decrease risk of diabetes. She denies hypoglycemia.  Vitamin D deficiency Danielle Shaw has a diagnosis of vitamin D deficiency. She is currently taking vit D and denies nausea, vomiting or muscle weakness. Her last vitamin D level wast at 29.3 on 03/17/18.  ALLERGIES: Allergies  Allergen Reactions   Latex    Lisinopril Cough   Metoprolol Other (See Comments)    Funny feeling    MEDICATIONS: Current Outpatient Medications on File Prior to Visit  Medication Sig Dispense Refill   albuterol (PROVENTIL HFA;VENTOLIN HFA) 108 (90 Base) MCG/ACT inhaler INHALE TWO PUFFS BY MOUTH EVERY 6 HOURS AS NEEDED FOR WHEEZING OR SHORTNESS OF BREATH 18 each 1   aspirin 81 MG tablet Take 81 mg by mouth daily.      cyclobenzaprine (FLEXERIL) 5 MG tablet Take 1 tablet (5 mg total) by mouth 3 (three) times daily as needed for muscle spasms. 30 tablet 1   HYDROcodone-homatropine (HYCODAN) 5-1.5 MG/5ML syrup Take 5-10 mLs by mouth at  bedtime as needed for cough. 50 mL 0   ipratropium (ATROVENT) 0.03 % nasal spray Place 2 sprays into both nostrils every 12 (twelve) hours. 30 mL 12   levocetirizine (XYZAL) 5 MG tablet Take 5 mg by mouth every evening.     losartan-hydrochlorothiazide (HYZAAR) 50-12.5 MG tablet Take 1 tablet by mouth daily. 30 tablet 0   meloxicam (MOBIC) 15 MG tablet Take 1 tablet (15 mg total) by mouth daily as needed for pain. 30 tablet 1   metFORMIN (GLUCOPHAGE) 500 MG tablet Take 1 tablet (500 mg total) by mouth daily with breakfast. 30 tablet 0   modafinil (PROVIGIL) 200 MG tablet Take 1 tablet (200 mg total) by mouth daily. 30 tablet 5   pantoprazole (PROTONIX) 40 MG tablet Take 1 tablet (40 mg total) by mouth daily. 90 tablet 3   rosuvastatin (CRESTOR) 20 MG tablet Take 1 tablet (20 mg total) by mouth daily. 30 tablet 0   traMADol (ULTRAM) 50 MG tablet Take 1 tablet (50 mg total) by mouth every 8 (eight) hours as needed. 30 tablet 0   Vitamin D, Ergocalciferol, (DRISDOL) 50000 units CAPS capsule Take 1 capsule (50,000 Units total) by mouth every 7 (seven) days. 12 capsule 0   No current facility-administered medications on file prior to visit.     PAST MEDICAL HISTORY: Past Medical History:  Diagnosis Date   Arrhythmia    Arthritis  GERD (gastroesophageal reflux disease)    Hyperlipidemia    Hypertension    IUD 2004   IUD 2009   Migraine    SOB (shortness of breath)     PAST SURGICAL HISTORY: Past Surgical History:  Procedure Laterality Date   BREAST BIOPSY Right 2011   BREAST SURGERY  2000   reduction   CESAREAN SECTION      SOCIAL HISTORY: Social History   Tobacco Use   Smoking status: Never Smoker   Smokeless tobacco: Never Used  Substance Use Topics   Alcohol use: No    Alcohol/week: 0.0 standard drinks   Drug use: No    FAMILY HISTORY: Family History  Problem Relation Age of Onset   Heart disease Mother 74   High Cholesterol Mother     High blood pressure Mother    Diabetes Father    Heart disease Father    High blood pressure Father    High Cholesterol Father    Heart disease Maternal Grandmother    Hodgkin's lymphoma Daughter     ROS: Review of Systems  Constitutional: Positive for weight loss.  Gastrointestinal: Positive for diarrhea. Negative for nausea and vomiting.  Musculoskeletal:       Negative for muscle weakness  Endo/Heme/Allergies:       Positive for polyphagia Negative for cravings Negative for hypoglycemia    PHYSICAL EXAM: Blood pressure 124/80, pulse 65, temperature 97.8 F (36.6 C), temperature source Oral, height 5\' 5"  (1.651 m), weight (!) 306 lb (138.8 kg), SpO2 96 %. Body mass index is 50.92 kg/m. Physical Exam  Constitutional: She is oriented to person, place, and time. She appears well-developed and well-nourished.  Cardiovascular: Normal rate.  Pulmonary/Chest: Effort normal.  Musculoskeletal: Normal range of motion.  Neurological: She is oriented to person, place, and time.  Skin: Skin is warm and dry.  Psychiatric: She has a normal mood and affect. Her behavior is normal.  Vitals reviewed.   RECENT LABS AND TESTS: BMET    Component Value Date/Time   NA 137 12/14/2017 2303   NA 139 12/08/2017 1201   K 3.5 12/14/2017 2303   CL 101 12/14/2017 2303   CO2 26 12/14/2017 2303   GLUCOSE 101 (H) 12/14/2017 2303   BUN 13 12/14/2017 2303   BUN 10 12/08/2017 1201   CREATININE 0.78 12/14/2017 2303   CREATININE 0.64 02/01/2014 1005   CALCIUM 9.4 12/14/2017 2303   GFRNONAA >60 12/14/2017 2303   GFRAA >60 12/14/2017 2303   Lab Results  Component Value Date   HGBA1C 6.0 (H) 03/17/2018   HGBA1C 6.2 (H) 12/08/2017   HGBA1C 6.4 12/24/2016   HGBA1C 6.0 01/21/2016   HGBA1C 6.0 05/03/2015   Lab Results  Component Value Date   INSULIN 16.3 03/17/2018   INSULIN 20.9 12/08/2017   CBC    Component Value Date/Time   WBC 5.2 12/14/2017 2303   RBC 4.65 12/14/2017 2303    HGB 13.7 12/14/2017 2303   HGB 12.9 12/08/2017 1201   HCT 40.4 12/14/2017 2303   HCT 39.5 12/08/2017 1201   PLT 379 12/14/2017 2303   MCV 86.9 12/14/2017 2303   MCV 89 12/08/2017 1201   MCH 29.5 12/14/2017 2303   MCHC 33.9 12/14/2017 2303   RDW 13.7 12/14/2017 2303   RDW 15.0 12/08/2017 1201   LYMPHSABS 2.0 12/08/2017 1201   MONOABS 0.3 02/01/2014 1005   EOSABS 0.4 12/08/2017 1201   BASOSABS 0.0 12/08/2017 1201   Iron/TIBC/Ferritin/ %Sat No results found for: IRON,  TIBC, FERRITIN, IRONPCTSAT Lipid Panel     Component Value Date/Time   CHOL 131 03/17/2018 1016   TRIG 63 03/17/2018 1016   HDL 38 (L) 03/17/2018 1016   CHOLHDL 7 12/24/2016 1142   VLDL 27.4 12/24/2016 1142   LDLCALC 80 03/17/2018 1016   LDLDIRECT 177 (H) 10/28/2010 0916   Hepatic Function Panel     Component Value Date/Time   PROT 7.4 12/08/2017 1201   ALBUMIN 4.2 12/08/2017 1201   AST 18 12/08/2017 1201   ALT 11 12/08/2017 1201   ALKPHOS 114 12/08/2017 1201   BILITOT 0.4 12/08/2017 1201      Component Value Date/Time   TSH 1.310 12/08/2017 1201   TSH 1.125 02/01/2014 1005   TSH 1.310 05/06/2011 1131   Results for LONETA, TAMPLIN (MRN [id number]) as of 03/30/2018 17:42  Ref. Range 03/17/2018 10:16  Vitamin D, 25-Hydroxy Latest Ref Range: 30.0 - 100.0 ng/mL 29.3 (L)   ASSESSMENT AND PLAN: Prediabetes  Vitamin D deficiency  Class 3 severe obesity with serious comorbidity and body mass index (BMI) of 50.0 to 59.9 in adult, unspecified obesity type ([place])  PLAN:  Pre-Diabetes Jonae will continue to work on weight loss, exercise, and decreasing simple carbohydrates in her diet to help decrease the risk of diabetes. We dicussed metformin including benefits and risks. She was informed that eating too many simple carbohydrates or too many calories at one sitting increases the likelihood of GI side effects. Kanika will continue and she will decrease refined carbohydrates. Fenix agreed to follow up with [place] as  directed to monitor her progress.  Vitamin D Deficiency Birdell was informed that low vitamin D levels contributes to fatigue and are associated with obesity, breast, and colon cancer. She agrees to continue vitamin D rich foods and take prescription Vit D @50 ,000 IU every week and will follow up for routine testing of vitamin D, at least 2-3 times per year. She was informed of the risk of over-replacement of vitamin D and agrees to not increase her dose unless she discusses this with [place] first.  I spent > than 50% of the 15 minute visit on counseling as documented in the note.  Obesity Danielle Shaw is currently in the action stage of change. As such, her goal is to continue with weight loss efforts She has agreed to follow the Category 3 plan Danielle Shaw has been instructed to work up to a goal of 150 minutes of combined cardio and strengthening exercise per week or do walking and going to the gym 3 days per week for weight loss and overall health benefits. We discussed the following Behavioral Modification Strategies today: increase H2O intake, no skipping meals, travel eating strategies, increasing lean protein intake, decreasing simple carbohydrates  and increasing vegetables  We discussed a plan for vacation. She will decrease carbohydrates and sugars and she will increase protein and H2O intake.  Danielle Shaw has agreed to follow up with our clinic in 2 weeks. She was informed of the importance of frequent follow up visits to maximize her success with intensive lifestyle modifications for her multiple health conditions.   OBESITY BEHAVIORAL INTERVENTION VISIT  Today's visit was # 8   Starting weight: 335 lbs Starting date: 12/08/17 Today's weight : 306 lbs  Today's date: 03/30/2018 Total lbs lost to date: 58   ASK: We discussed the diagnosis of obesity with Danielle Shaw today and Danielle Shaw agreed to give [place] permission to discuss obesity behavioral modification therapy today.  ASSESS: Danielle Shaw has the  diagnosis of obesity and her BMI today is 50.92 Danielle Shaw is in the action stage of change   ADVISE: Danielle Shaw was educated on the multiple health risks of obesity as well as the benefit of weight loss to improve her health. She was advised of the need for long term treatment and the importance of lifestyle modifications to improve her current health and to decrease her risk of future health problems.  AGREE: Multiple dietary modification options and treatment options were discussed and  Danielle Shaw agreed to follow the recommendations documented in the above note.  ARRANGE: Kanijah was educated on the importance of frequent visits to treat obesity as outlined per CMS and USPSTF guidelines and agreed to schedule her next follow up appointment today.  Corey Skains, am acting as Location manager for [employer]. Owens Shark, DO  I have reviewed the above documentation for accuracy and completeness, and I agree with the above. -Jearld Lesch, DO

## 2018-03-30 NOTE — Progress Notes (Signed)
SLEEP MEDICINE CLINIC   Provider:  Larey Shaw, M D  Primary Care Physician:  Danielle Koch, MD   Referring Provider: Hoyt Shaw, *  Chief Complaint  Patient presents with  . Follow-up    pt alone, rm 11. DME [employer], pt states that despite using the machine she still complains of fatigue. using the modafinile does help with those moments of feeling tired.     HPI:  Danielle Shaw is a 47 y.o. female patient who has undergone sleep testing recently and she is here to discuss the results.   The patient underwent a split-night titration study on 13 Dec 2017.  She had endorsed the Epworth Sleepiness Scale at the time at 16 out of 24 possible points, and reached a BMI of 58, and had a high blood pressure the day of the encounter at 152/110 mmHg.  The diagnostic part showed an apnea-hypopnea index of 83.1/h.  No major accentuation by REM sleep and all sleep was in supine position.  CPAP was initiated at 5-titrated to 12 cmH2O which reduced her apnea index significantly she did enter REM sleep finally under CPAP titration, she was fitted with an air fit P 10 medium size.  We have provided an auto titration CPAP for her of which I will show her results today.  The patient continues to work third shift on weekends, but she has managed to be highly compliant CPAP user at 100% of days and 93% of these days over 4 hours, her average use of time is 6 hours 57 minutes, the AutoSet is set between 5 and 15 cmH2O with 3 cm EPR pressure at the 95th percentile is 13.6 cmH2O, residual apnea hypopnea index is 0.8.  This is a perfect resolution of apnea, and  she does not have major or constant air leaks.  In my initial consult with the patient I mentioned that the left thenar eminence was shrunken and she has sensory changes- I suspected carpal tunnel and she had a positive tinnel sign. I asked her to try soft wrist braces. To my great surprise she was send to another neurology office  for NCV and EMG testing (?) referred by [employer].  As obesity was her main risk factor I asked her to see a bariatric specialist. She is now enrolled at medical weight management, [employer]- and she lost 25-30 pounds.    She was seen here in a referral from [employer] for a sleep medicine evaluation.  Danielle [employer] reports that she has been told that she snores and probably has been a snorer for many years but in the past did not have the insurance coverage for sleep study.  Her bed partner states that she is snoring a lot sometimes she wakes up choking or feeling that she cannot get the next breath.  She also has hypertension, headaches which are partially attributed to migraine, partially to sinusitis and may also be exacerbated under uncontrolled hypertension.  She did not have chest pains but she did have cold symptoms with coughing shortness of breath and congestion.  She also had some chills.  During the visit with Danielle Shaw on 19 March the patient had a blood pressure of 152/110 mmhg and had run out of her antihypertensives which is losartan and hydrochlorothiazide.  She has reached morbid obesity- BMI 58. No history of gestational diabetes. HTN, sinusuitis, unable to sleep longer than 4-5 hours.  Chief complaint according to patient : " I can go  to sleep , but I sleep only 4-5 hours on a weekday"   Sleep habits are as follows: she return by 5 pm from work, cooks, takes a bath. Bedtime after a morning shift is around 10 Pm- she is often asleep within 5-10  Minutes.  She sleeps on her left side, on one pillow. The pillow helps acid reflux.  The bedroom is cool, quiet and dark. Her boyfriend uses a CPAP. He watches TV and plays video game in the bedroom with  headphones-  On weekends - he used to eat in bed , too. That has stopped after she intervened.  She feels disturbed by the light. She has covered all clocks.  He has to wake at 4.30 , but she seems always awake at 3.30 and would like  to gain that extra hour.  She wakes sometimes clammy, and has felt palpitations, wakes up in a sweat-. headaches recently in AM - did not stop with taking HTN medication.   Sleep medical history and family sleep history:  IUD for birth control, morbid obesity, HTN, irregular heart beats. No night terrors or sleep waling history. One brother is obese, snores, alcoholic,  ESRD on Hemo-dialysis.   Social history: Patient is a [name], academic at [employer]. She is on rotating shifts, one week a month at night, mainly in AM- 6 AM through 5 PM- on salary position , 5 days a week.  Single- raised her children alone. 2 daughters 76 and 23, and a 47 year old grandson.  She drinks seldomly- 1 drink per 3 month,no  tobacco use, caffein - reduced soda- had none in 2019, iced tea 8 ounces- 2  glasses , no coffee.   Review of Systems: Out of a complete 14 system review, the patient complains of only the following symptoms, and all other reviewed systems are negative. 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  She also mentioned some sensory changes in the left, non dominant hand.  Weight loss is going well . Third shift work. Modafinil user.   Epworth score  On CPAP has decreased 8 from 16/ 24   , Fatigue severity score 40- from  54/ 63 points   , depression score n/a     Patient: [employer]le [employer] DOB: 1971/07/19 Physician: Danielle Amber, DO  Sex: Female Height: 5\' 5"  Ref Phys: Danielle Crumb, MD  ID#: [id number] Temp: 36.0C Technician:    Patient Complaints: This is a 47 year old female referred for evaluation of bilateral hand paresthesias, worse on the left.  NCV & EMG Findings: Extensive electrodiagnostic testing of the left upper extremity and additional studies of the right shows:  1. Left median sensory response shows prolonged distal peak latency (3.7 ms) and reduced amplitude (11.7 V).  Right median, right mixed palmer, and bilateral ulnar sensory responses are within normal limits.    2. Bilateral median and ulnar motor responses are within normal limits. 3. Chronic motor axon loss changes are seen affecting the C8 myotomes bilaterally, without accompanied active denervation.  Impression: 1. Left median neuropathy at or distal to the wrist, consistent with the clinical diagnosis of carpal tunnel syndrome. Overall, these findings are moderate in degree electrically. 2. 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   ___________________________ Danielle Amber, DO    Social History   Socioeconomic History  . Marital status: Significant Other    Spouse name: Not on file  . Number of children: 2  . Years of education: Not on file  .  Highest education level: Not on file  Occupational History  . Not on file  Social Needs  . Financial resource strain: Not on file  . Food insecurity:    Worry: Not on file    Inability: Not on file  . Transportation needs:    Medical: Not on file    Non-medical: Not on file  Tobacco Use  . Smoking status: Never Smoker  . Smokeless tobacco: Never Used  Substance and Sexual Activity  . Alcohol use: No    Alcohol/week: 0.0 standard drinks  . Drug use: No  . Sexual activity: Yes    Partners: Male  Lifestyle  . Physical activity:    Days per week: Not on file    Minutes per session: Not on file  . Stress: Not on file  Relationships  . Social connections:    Talks on phone: Not on file    Gets together: Not on file    Attends religious service: Not on file    Active member of club or organization: Not on file    Attends meetings of clubs or organizations: Not on file    Relationship status: Not on file  . Intimate partner violence:    Fear of current or ex partner: Not on file    Emotionally abused: Not on file    Physically abused: Not on file    Forced sexual activity: Not on file  Other Topics Concern  . Not on file  Social History Narrative  . Not on file    Family  History  Problem Relation Age of Onset  . Heart disease Mother 59  . High Cholesterol Mother   . High blood pressure Mother   . Diabetes Father   . Heart disease Father   . High blood pressure Father   . High Cholesterol Father   . Heart disease Maternal Grandmother   . Hodgkin's lymphoma Daughter     Past Medical History:  Diagnosis Date  . Arrhythmia   . Arthritis   . GERD (gastroesophageal reflux disease)   . Hyperlipidemia   . Hypertension   . IUD 2004  . IUD 2009  . Migraine   . SOB (shortness of breath)     Past Surgical History:  Procedure Laterality Date  . BREAST BIOPSY Right 2011  . BREAST SURGERY  2000   reduction  . CESAREAN SECTION      Current Outpatient Medications  Medication Sig Dispense Refill  . albuterol (PROVENTIL HFA;VENTOLIN HFA) 108 (90 Base) MCG/ACT inhaler INHALE TWO PUFFS BY MOUTH EVERY 6 HOURS AS NEEDED FOR WHEEZING OR SHORTNESS OF BREATH 18 each 1  . aspirin 81 MG tablet Take 81 mg by mouth daily.     . cyclobenzaprine (FLEXERIL) 5 MG tablet Take 1 tablet (5 mg total) by mouth 3 (three) times daily as needed for muscle spasms. 30 tablet 1  . HYDROcodone-homatropine (HYCODAN) 5-1.5 MG/5ML syrup Take 5-10 mLs by mouth at bedtime as needed for cough. 50 mL 0  . ipratropium (ATROVENT) 0.03 % nasal spray Place 2 sprays into both nostrils every 12 (twelve) hours. 30 mL 12  . levocetirizine (XYZAL) 5 MG tablet Take 5 mg by mouth every evening.    [name] losartan-hydrochlorothiazide (HYZAAR) 50-12.5 MG tablet Take 1 tablet by mouth daily. 30 tablet 0  . meloxicam (MOBIC) 15 MG tablet Take 1 tablet (15 mg total) by mouth daily as needed for pain. 30 tablet 1  . metFORMIN (GLUCOPHAGE) 500 MG tablet Take  1 tablet (500 mg total) by mouth daily with breakfast. 30 tablet 0  . modafinil (PROVIGIL) 100 MG tablet Use when sleepy at work or driving , do not use within 4 hours of intended bedtime. 30 tablet 0  . pantoprazole (PROTONIX) 40 MG tablet Take 1 tablet (40  mg total) by mouth daily. 90 tablet 3  . rosuvastatin (CRESTOR) 20 MG tablet Take 1 tablet (20 mg total) by mouth daily. 30 tablet 0  . traMADol (ULTRAM) 50 MG tablet Take 1 tablet (50 mg total) by mouth every 8 (eight) hours as needed. 30 tablet 0  . Vitamin D, Ergocalciferol, (DRISDOL) 50000 units CAPS capsule Take 1 capsule (50,000 Units total) by mouth every 7 (seven) days. 12 capsule 0   No current facility-administered medications for this visit.     Allergies as of 03/30/2018 - Review Complete 03/30/2018  Allergen Reaction Noted  . Latex    . Lisinopril Cough 10/22/2011  . Metoprolol Other (See Comments) 10/22/2011    Vitals: BP (!) 154/82   Pulse 62   Ht 5' 4.5" (1.638 m)   Wt (!) 312 lb (141.5 kg)   BMI 52.73 kg/m  Last Weight:  Wt Readings from Last 1 Encounters:  03/30/18 (!) 312 lb (141.5 kg)   MGQ:QPYP mass index is 52.73 kg/m.     Last Height:   Ht Readings from Last 1 Encounters:  03/30/18 5' 4.5" (1.638 m)    Physical exam:  General: The patient is awake, alert and appears not in acute distress. The patient is well groomed. Head: Normocephalic, atraumatic. Neck is supple. Mallampati ,  neck circumference:18. 5 . Nasal airflow patent , TMJ unremarkable . Retrognathia is mild.  Cardiovascular:  Regular rate and rhythm , without  murmurs or carotid bruit, and without distended neck veins. Respiratory: wheezing  to auscultation. Skin:  Without evidence of edema, or rash Trunk: BMI is 58. The patient's posture is erect  Neurologic exam : The patient is awake and alert, oriented to place and time.    Attention span & concentration ability appears normal.  Speech is fluent,  without dysarthria, dysphonia or aphasia.  Mood and affect are appropriate.  Cranial nerves: Pupils are equal and briskly reactive to light. Funduscopic exam without evidence of pallor or edema. Extraocular movements  in vertical and horizontal planes intact and without nystagmus. Visual  fields by finger perimetry are intact. Hearing to finger rub intact.  Facial sensation intact to fine touch. Facial motor strength is symmetric and tongue and uvula move midline. Shoulder shrug was symmetrical.   Motor exam:   Normal tone, muscle bulk and symmetric strength in all extremities.  Has good grip strength but left thenar eminence is smaller- carpal tunnel?   Sensory:  Fine touch, pinprick and vibration were tested in all extremities. Proprioception tested in the upper extremities was normal. She has sometimes the feeling of her left arm being asleep, over the thighs. Her left arm is sometimes clumsy.   Coordination: Rapid alternating movements in the fingers/hands was normal. Deep tendon reflexes: in the upper and lower extremities are symmetric and intact.    Assessment:  After physical and neurologic examination, review of laboratory studies,  Personal review of imaging studies, reports of other /same  Imaging studies, results of polysomnography and / or neurophysiology testing and pre-existing records as far as provided in visit., my assessment is   1) shift worker on modafinil.  2) severe OSA-  Responding well to CPAP 5-15 cm  water, 3 cm EPR. Responding well in terms of sleepiness, fatigue/   3)  Obesity - treated with [facility]'s help.  4) the carpal tunnel symptoms I described in my last visit ( sleep consult) were further evaluated by [facility], Danielle Amber, DO.     The patient was advised of the nature of the diagnosed disorder , the treatment options and the  risks for general health and wellness arising from not treating the condition.   I spent more than 25 -30  minutes of face to face time with the patient.  Greater than 50% of time was spent in counseling and coordination of care. We have discussed the diagnosis and differential and I answered the patient's questions.    Plan:  Treatment plan and additional workup : continue CPAP, modafinil. Rv in 6-8  month.   Danielle Seat, MD 5/0/2774, 1:28 PM  Certified in Neurology by [employer]BPN Certified in [place] by [facility] [address] [place], [place] [address]

## 2018-03-31 ENCOUNTER — Encounter: Payer: Self-pay | Admitting: Internal Medicine

## 2018-04-04 ENCOUNTER — Ambulatory Visit (INDEPENDENT_AMBULATORY_CARE_PROVIDER_SITE_OTHER): Payer: Self-pay | Admitting: Bariatrics

## 2018-04-05 DIAGNOSIS — G4733 Obstructive sleep apnea (adult) (pediatric): Secondary | ICD-10-CM | POA: Diagnosis not present

## 2018-04-13 ENCOUNTER — Ambulatory Visit (INDEPENDENT_AMBULATORY_CARE_PROVIDER_SITE_OTHER): Payer: BLUE CROSS/BLUE SHIELD | Admitting: Family Medicine

## 2018-04-13 VITALS — BP 127/78 | HR 76 | Temp 97.8°F | Ht 65.0 in | Wt 311.0 lb

## 2018-04-13 DIAGNOSIS — R0602 Shortness of breath: Secondary | ICD-10-CM

## 2018-04-13 DIAGNOSIS — Z9189 Other specified personal risk factors, not elsewhere classified: Secondary | ICD-10-CM | POA: Diagnosis not present

## 2018-04-13 DIAGNOSIS — I10 Essential (primary) hypertension: Secondary | ICD-10-CM

## 2018-04-13 DIAGNOSIS — Z6841 Body Mass Index (BMI) 40.0 and over, adult: Secondary | ICD-10-CM

## 2018-04-13 MED ORDER — ALBUTEROL SULFATE HFA 108 (90 BASE) MCG/ACT IN AERS
INHALATION_SPRAY | RESPIRATORY_TRACT | 1 refills | Status: DC
Start: 2018-04-13 — End: 2018-05-19

## 2018-04-13 NOTE — Progress Notes (Signed)
Office: [phone]  /  Fax: [phone]   HPI:   Chief Complaint: OBESITY Danielle Shaw is here to discuss her progress with her obesity treatment plan. She is on the Category 3 plan and is following her eating plan approximately 95 % of the time. She states she is exercising 0 minutes 0 times per week. Danielle Shaw had vacation for 10 days and was sick for 3 days during that time with an upper respiratory infection, which came back 3 days ago. She feels that she Shaw be retaining fluid.  Her weight is (!) 311 lb (141.1 kg) today and has not lost weight since her last visit. She has lost 24 lbs since starting treatment with [place].  Hypertension Danielle Shaw is a 47 y.o. female with hypertension. Danielle Shaw denies chest pain, chest pressure, or headaches. She is working on weight loss to help control her blood pressure with the goal of decreasing her risk of heart attack and stroke. Danielle Shaw's blood pressure is currently controlled.  At risk for cardiovascular disease Danielle Shaw is at a higher than average risk for cardiovascular disease due to hypertension and obesity.  Shortness of breath Danielle Shaw notes increasing shortness of breath. She notes getting out of breath sooner with activity than she used to. She has a history of allergic rhinitis. She needed to use her inhaler on vacation secondary to her upper respiratory infection.  ALLERGIES: Allergies  Allergen Reactions  . Latex   . Lisinopril Cough  . Metoprolol Other (See Comments)    Funny feeling    MEDICATIONS: Current Outpatient Medications on File Prior to Visit  Medication Sig Dispense Refill  . aspirin 81 MG tablet Take 81 mg by mouth daily.     . cyclobenzaprine (FLEXERIL) 5 MG tablet Take 1 tablet (5 mg total) by mouth 3 (three) times daily as needed for muscle spasms. 30 tablet 1  . HYDROcodone-homatropine (HYCODAN) 5-1.5 MG/5ML syrup Take 5-10 mLs by mouth at bedtime as needed for cough. 50 mL 0  . ipratropium (ATROVENT) 0.03 %  nasal spray Place 2 sprays into both nostrils every 12 (twelve) hours. 30 mL 12  . levocetirizine (XYZAL) 5 MG tablet Take 5 mg by mouth every evening.    Danielle Shaw Kitchen losartan-hydrochlorothiazide (HYZAAR) 50-12.5 MG tablet Take 1 tablet by mouth daily. 30 tablet 0  . meloxicam (MOBIC) 15 MG tablet Take 1 tablet (15 mg total) by mouth daily as needed for pain. 30 tablet 1  . metFORMIN (GLUCOPHAGE) 500 MG tablet Take 1 tablet (500 mg total) by mouth daily with breakfast. 30 tablet 0  . modafinil (PROVIGIL) 200 MG tablet Take 1 tablet (200 mg total) by mouth daily. 30 tablet 5  . pantoprazole (PROTONIX) 40 MG tablet Take 1 tablet (40 mg total) by mouth daily. 90 tablet 3  . rosuvastatin (CRESTOR) 20 MG tablet Take 1 tablet (20 mg total) by mouth daily. 30 tablet 0  . traMADol (ULTRAM) 50 MG tablet Take 1 tablet (50 mg total) by mouth every 8 (eight) hours as needed. 30 tablet 0  . Vitamin D, Ergocalciferol, (DRISDOL) 50000 units CAPS capsule Take 1 capsule (50,000 Units total) by mouth every 7 (seven) days. 12 capsule 0   No current facility-administered medications on file prior to visit.     PAST MEDICAL HISTORY: Past Medical History:  Diagnosis Date  . Arrhythmia   . Arthritis   . GERD (gastroesophageal reflux disease)   . Hyperlipidemia   . Hypertension   . IUD 2004  .  IUD 2009  . Migraine   . SOB (shortness of breath)     PAST SURGICAL HISTORY: Past Surgical History:  Procedure Laterality Date  . BREAST BIOPSY Right 2011  . BREAST SURGERY  2000   reduction  . CESAREAN SECTION      SOCIAL HISTORY: Social History   Tobacco Use  . Smoking status: Never Smoker  . Smokeless tobacco: Never Used  Substance Use Topics  . Alcohol use: No    Alcohol/week: 0.0 standard drinks  . Drug use: No    FAMILY HISTORY: Family History  Problem Relation Age of Onset  . Heart disease Mother 49  . High Cholesterol Mother   . High blood pressure Mother   . Diabetes Father   . Heart disease  Father   . High blood pressure Father   . High Cholesterol Father   . Heart disease Maternal Grandmother   . Hodgkin's lymphoma Daughter     ROS: Review of Systems  Constitutional: Negative for weight loss.  Respiratory: Positive for shortness of breath.   Cardiovascular: Negative for chest pain.       Negative for chest pressure.  Neurological: Negative for headaches.    PHYSICAL EXAM: Blood pressure 127/78, pulse 76, temperature 97.8 F (36.6 C), temperature source Oral, height 5\' 5"  (1.651 m), weight (!) 311 lb (141.1 kg), SpO2 96 %. Body mass index is 51.75 kg/m. Physical Exam  Constitutional: She appears well-developed and well-nourished.  Cardiovascular: Normal rate.  Pulmonary/Chest: Effort normal.  Musculoskeletal: Normal range of motion.  Skin: Skin is warm and dry.  Psychiatric: She has a normal mood and affect. Her behavior is normal.  Vitals reviewed.   RECENT LABS AND TESTS: BMET    Component Value Date/Time   NA 137 12/14/2017 2303   NA 139 12/08/2017 1201   K 3.5 12/14/2017 2303   CL 101 12/14/2017 2303   CO2 26 12/14/2017 2303   GLUCOSE 101 (H) 12/14/2017 2303   BUN 13 12/14/2017 2303   BUN 10 12/08/2017 1201   CREATININE 0.78 12/14/2017 2303   CREATININE 0.64 02/01/2014 1005   CALCIUM 9.4 12/14/2017 2303   GFRNONAA >60 12/14/2017 2303   GFRAA >60 12/14/2017 2303   Lab Results  Component Value Date   HGBA1C 6.0 (H) 03/17/2018   HGBA1C 6.2 (H) 12/08/2017   HGBA1C 6.4 12/24/2016   HGBA1C 6.0 01/21/2016   HGBA1C 6.0 05/03/2015   Lab Results  Component Value Date   INSULIN 16.3 03/17/2018   INSULIN 20.9 12/08/2017   CBC    Component Value Date/Time   WBC 5.2 12/14/2017 2303   RBC 4.65 12/14/2017 2303   HGB 13.7 12/14/2017 2303   HGB 12.9 12/08/2017 1201   HCT 40.4 12/14/2017 2303   HCT 39.5 12/08/2017 1201   PLT 379 12/14/2017 2303   MCV 86.9 12/14/2017 2303   MCV 89 12/08/2017 1201   MCH 29.5 12/14/2017 2303   MCHC 33.9  12/14/2017 2303   RDW 13.7 12/14/2017 2303   RDW 15.0 12/08/2017 1201   LYMPHSABS 2.0 12/08/2017 1201   MONOABS 0.3 02/01/2014 1005   EOSABS 0.4 12/08/2017 1201   BASOSABS 0.0 12/08/2017 1201   Iron/TIBC/Ferritin/ %Sat No results found for: IRON, TIBC, FERRITIN, IRONPCTSAT Lipid Panel     Component Value Date/Time   CHOL 131 03/17/2018 1016   TRIG 63 03/17/2018 1016   HDL 38 (L) 03/17/2018 1016   CHOLHDL 7 12/24/2016 1142   VLDL 27.4 12/24/2016 1142   LDLCALC 80  03/17/2018 1016   LDLDIRECT 177 (H) 10/28/2010 0916   Hepatic Function Panel     Component Value Date/Time   PROT 7.4 12/08/2017 1201   ALBUMIN 4.2 12/08/2017 1201   AST 18 12/08/2017 1201   ALT 11 12/08/2017 1201   ALKPHOS 114 12/08/2017 1201   BILITOT 0.4 12/08/2017 1201      Component Value Date/Time   TSH 1.310 12/08/2017 1201   TSH 1.125 02/01/2014 1005   TSH 1.310 05/06/2011 1131   Results for BLESSYN, [place] (MRN [id number]) as of 04/13/2018 16:59  Ref. Range 03/17/2018 10:16  Vitamin D, 25-Hydroxy Latest Ref Range: 30.0 - 100.0 ng/mL 29.3 (L)   ASSESSMENT AND PLAN: Essential hypertension  SOB (shortness of breath) on exertion - Plan: albuterol (PROVENTIL HFA;VENTOLIN HFA) 108 (90 Base) MCG/ACT inhaler  At risk for heart disease  Class 3 severe obesity with serious comorbidity and body mass index (BMI) of 50.0 to 59.9 in adult, unspecified obesity type (HCC)  PLAN:  Hypertension We discussed sodium restriction, working on healthy weight loss, and a regular exercise program as the means to achieve improved blood pressure control. Giannah agreed with this plan and agreed to follow up as directed. We will continue to monitor her blood pressure as well as her progress with the above lifestyle modifications. She will continue her medications as prescribed and will watch for signs of hypotension as she continues her lifestyle modifications.  Cardiovascular risk counseling Kelcee was given extended (15  minutes) coronary artery disease prevention counseling today. She is 47 y.o. female and has risk factors for heart disease, including hypertension and obesity. We discussed intensive lifestyle modifications today with an emphasis on specific weight loss instructions and strategies. Pt was also informed of the importance of increasing exercise and decreasing saturated fats to help prevent heart disease.  Shortness of breath We will refill Albuterol 168mcg/act inhaler, 2 puffs inhaled q6h as needed for shortness of breath or wheezing. Oona will follow up in 2 weeks.  Obesity Ian is currently in the action stage of change. As such, her goal is to continue with weight loss efforts. She has agreed to follow the Category 3 plan. Anastazja has been instructed to work up to a goal of 150 minutes of combined cardio and strengthening exercise per week for weight loss and overall health benefits. We discussed the following Behavioral Modification Strategies today: increasing lean protein intake, increasing vegetables, work on meal planning and easy cooking plans, and planning for success.  Meghann has agreed to follow up with our clinic in 2 weeks. She was informed of the importance of frequent follow up visits to maximize her success with intensive lifestyle modifications for her multiple health conditions.   OBESITY BEHAVIORAL INTERVENTION VISIT  Today's visit was # 9  Starting weight: 335 lbs Starting date: 12/08/17 Today's weight : Weight: (!) 311 lb (141.1 kg)  Today's date: 04/13/2018 Total lbs lost to date: 24  ASK: We discussed the diagnosis of obesity with Danielle Shaw today and Olivia Mackie agreed to give [place] permission to discuss obesity behavioral modification therapy today.  ASSESS: Jadee has the diagnosis of obesity and her BMI today is 51.75. Leomia is in the action stage of change.  ADVISE: Harrison was educated on the multiple health risks of obesity as well as the benefit of weight loss to  improve her health. She was advised of the need for long term treatment and the importance of lifestyle modifications to improve her current health and to  decrease her risk of future health problems.  AGREE: Multiple dietary modification options and treatment options were discussed and Irelyn agreed to follow the recommendations documented in the above note.  ARRANGE: Kenzie was educated on the importance of frequent visits to treat obesity as outlined per CMS and USPSTF guidelines and agreed to schedule her next follow up appointment today.  I, Marcille Blanco, am acting as Location manager for Eber Jones, MD  I have reviewed the above documentation for accuracy and completeness, and I agree with the above. - Ilene Qua, MD

## 2018-04-15 ENCOUNTER — Ambulatory Visit: Payer: BLUE CROSS/BLUE SHIELD | Admitting: Internal Medicine

## 2018-04-27 ENCOUNTER — Ambulatory Visit (INDEPENDENT_AMBULATORY_CARE_PROVIDER_SITE_OTHER): Payer: BLUE CROSS/BLUE SHIELD | Admitting: Family Medicine

## 2018-04-27 VITALS — BP 145/68 | HR 68 | Temp 98.5°F | Ht 65.0 in | Wt 309.0 lb

## 2018-04-27 DIAGNOSIS — Z9189 Other specified personal risk factors, not elsewhere classified: Secondary | ICD-10-CM

## 2018-04-27 DIAGNOSIS — R7303 Prediabetes: Secondary | ICD-10-CM

## 2018-04-27 DIAGNOSIS — Z6841 Body Mass Index (BMI) 40.0 and over, adult: Secondary | ICD-10-CM

## 2018-04-27 DIAGNOSIS — E559 Vitamin D deficiency, unspecified: Secondary | ICD-10-CM | POA: Diagnosis not present

## 2018-04-27 DIAGNOSIS — K921 Melena: Secondary | ICD-10-CM | POA: Diagnosis not present

## 2018-04-27 MED ORDER — METFORMIN HCL 500 MG PO TABS: 500.0000 mg | ORAL_TABLET | Freq: Every day | ORAL | 0 refills | Status: DC

## 2018-04-27 MED ORDER — VITAMIN D (ERGOCALCIFEROL) 1.25 MG (50000 UNIT) PO CAPS: 50000.0000 [IU] | ORAL_CAPSULE | ORAL | 0 refills | Status: DC

## 2018-04-28 DIAGNOSIS — G5603 Carpal tunnel syndrome, bilateral upper limbs: Secondary | ICD-10-CM | POA: Diagnosis not present
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
## 2018-05-05 DIAGNOSIS — G4733 Obstructive sleep apnea (adult) (pediatric): Secondary | ICD-10-CM | POA: Diagnosis not present

## 2018-05-06 ENCOUNTER — Encounter: Payer: Self-pay | Admitting: Cardiology

## 2018-05-06 ENCOUNTER — Ambulatory Visit (INDEPENDENT_AMBULATORY_CARE_PROVIDER_SITE_OTHER): Payer: BLUE CROSS/BLUE SHIELD | Admitting: Cardiology

## 2018-05-06 VITALS — BP 122/84 | HR 68 | Ht 65.0 in | Wt 311.8 lb

## 2018-05-06 DIAGNOSIS — R079 Chest pain, unspecified: Secondary | ICD-10-CM | POA: Insufficient documentation

## 2018-05-06 DIAGNOSIS — R0789 Other chest pain: Secondary | ICD-10-CM

## 2018-05-06 DIAGNOSIS — I10 Essential (primary) hypertension: Secondary | ICD-10-CM | POA: Diagnosis not present

## 2018-05-06 DIAGNOSIS — R002 Palpitations: Secondary | ICD-10-CM | POA: Diagnosis not present

## 2018-05-06 DIAGNOSIS — Z6841 Body Mass Index (BMI) 40.0 and over, adult: Secondary | ICD-10-CM

## 2018-05-06 NOTE — Progress Notes (Signed)
Cardiology Office Note:    Date:  05/06/2018   ID:  Danielle Shaw, DOB 1971-01-14, MRN [id number]  PCP:  Hoyt Koch, MD  Cardiologist:  Dorris Carnes, MD  Referring MD: Hoyt Koch, *   Chief Complaint  Patient presents with  . Follow-up    History of Present Illness:    Danielle Shaw is a 47 y.o. female with a past medical history significant for hypertension, hyperlipidemia, morbid obesity, prediabetes, vitamin D deficiency, palpitation and GERD.  She was seen in 2012 by Dr. Harrington Challenger, at the time, she was evaluated for palpitation.  Holter monitor showed PAC and PVCs with one short burst of atrial fibrillation.  Echocardiogram in 2012 showed normal EF 55 to 60%, mild LAE.  She was placed on beta-blocker by Dr. Harrington Challenger, however did not tolerated due to malaise and fatigue.  She was given a prescription for diltiazem to have on hand for symptomatic palpitation, however this prescription was never filled.  She was later seen by Dr. Mare Ferrari on 02/16/2014 complaining of recurrent palpitation.  It was advised for her not to drink caffeine and encourage weight loss.  Due to her borderline hypokalemia, a daily 20 mEq potassium chloride was prescribed.  She was seen in the ED on 12/15/2017 for left flank pain.  Urine showed moderate blood, large leukocytes and amorphous crystals.  Her symptom was concerning for pyelonephritis, however patient was concerned that this may be related to her heart based on her family history.  Her troponin was normal, d-dimer was within normal limits as well.  She was treated for possible pyelonephritis with Keflex and refer to cardiology service as outpatient.  She was seen in our office on 01/11/2018 by Almyra Deforest, PA. He felt that her flank pain was clearly not related to a cardiac issue, however she was complaining of occasional substernal chest discomfort not associated with exertion.  She has a significant family history of early CAD with her mother  having died of an MI in her 46s and father having heart issues before age 44.  She also has risk factors of morbid obesity, hypertension, hyperlipidemia and prediabetes.  She also complained of palpitations.  An exercise tolerance test was done on 01/14/2018 that showed fair exercise tolerance, no chest pain, normal BP response, no EKG changes and was felt to be a negative adequate test. An event monitor shoed no arrhthymias to correlating to her palpitations.   She has lost 30 pounds and is not having palpitations much at all. Occ with more severe exertion. She has cut down on her working, still 2 jobs but less time.   She goes to a health and nutritionist. She is eating better and has recently joined a gym. She is exercising at least twice a week. She does a lot of walking at work. Her breathing is much better. She is no longer having heaviness in her chest.  According to recent labs at her PCP her cholesterol has greatly improved.  She is being treated for prediabetes with a A1c of 6.0.  Past Medical History:  Diagnosis Date  . Arrhythmia   . Arthritis   . GERD (gastroesophageal reflux disease)   . Hyperlipidemia   . Hypertension   . IUD 2004  . IUD 2009  . Migraine   . SOB (shortness of breath)     Past Surgical History:  Procedure Laterality Date  . BREAST BIOPSY Right 2011  . BREAST SURGERY  2000   reduction  .  CESAREAN SECTION      Current Medications: Current Meds  Medication Sig  . albuterol (PROVENTIL HFA;VENTOLIN HFA) 108 (90 Base) MCG/ACT inhaler INHALE TWO PUFFS BY MOUTH EVERY 6 HOURS AS NEEDED FOR WHEEZING OR SHORTNESS OF BREATH  . aspirin 81 MG tablet Take 81 mg by mouth daily.   . cyclobenzaprine (FLEXERIL) 5 MG tablet Take 1 tablet (5 mg total) by mouth 3 (three) times daily as needed for muscle spasms.  [name] HYDROcodone-homatropine (HYCODAN) 5-1.5 MG/5ML syrup Take 5-10 mLs by mouth at bedtime as needed for cough.  [name] ipratropium (ATROVENT) 0.03 % nasal spray Place 2  sprays into both nostrils every 12 (twelve) hours.  [name] levocetirizine (XYZAL) 5 MG tablet Take 5 mg by mouth every evening.  [name] losartan-hydrochlorothiazide (HYZAAR) 50-12.5 MG tablet Take 1 tablet by mouth daily.  . meloxicam (MOBIC) 15 MG tablet Take 1 tablet (15 mg total) by mouth daily as needed for pain.  . metFORMIN (GLUCOPHAGE) 500 MG tablet Take 1 tablet (500 mg total) by mouth daily with breakfast.  . modafinil (PROVIGIL) 200 MG tablet Take 1 tablet (200 mg total) by mouth daily.  . pantoprazole (PROTONIX) 40 MG tablet Take 1 tablet (40 mg total) by mouth daily.  . rosuvastatin (CRESTOR) 20 MG tablet Take 1 tablet (20 mg total) by mouth daily.  . traMADol (ULTRAM) 50 MG tablet Take 1 tablet (50 mg total) by mouth every 8 (eight) hours as needed.  . Vitamin D, Ergocalciferol, (DRISDOL) 50000 units CAPS capsule Take 1 capsule (50,000 Units total) by mouth every 7 (seven) days.     Allergies:   Latex; Lisinopril; and Metoprolol   Social History   Socioeconomic History  . Marital status: Significant Other    Spouse name: Not on file  . Number of children: 2  . Years of education: Not on file  . Highest education level: Not on file  Occupational History  . Not on file  Social Needs  . Financial resource strain: Not on file  . Food insecurity:    Worry: Not on file    Inability: Not on file  . Transportation needs:    Medical: Not on file    Non-medical: Not on file  Tobacco Use  . Smoking status: Never Smoker  . Smokeless tobacco: Never Used  Substance and Sexual Activity  . Alcohol use: No    Alcohol/week: 0.0 standard drinks  . Drug use: No  . Sexual activity: Yes    Partners: Male  Lifestyle  . Physical activity:    Days per week: Not on file    Minutes per session: Not on file  . Stress: Not on file  Relationships  . Social connections:    Talks on phone: Not on file    Gets together: Not on file    Attends religious service: Not on file    Active member of  club or organization: Not on file    Attends meetings of clubs or organizations: Not on file    Relationship status: Not on file  Other Topics Concern  . Not on file  Social History Narrative  . Not on file     Family History: The patient's family history includes Diabetes in her father; Heart disease in her father and maternal grandmother; Heart disease (age of onset: 65) in her mother; High Cholesterol in her father and mother; High blood pressure in her father and mother; Hodgkin's lymphoma in her daughter. ROS:   Please see the history  of present illness.     All other systems reviewed and are negative.  EKGs/Labs/Other Studies Reviewed:    The following studies were reviewed today:  Exercise tolerance test 01/14/2018 Study Highlights    Blood pressure demonstrated a normal response to exercise.  There was no ST segment deviation noted during stress.   ETT with fair exercise tolerance (6:26); no chest pain; normal BP response (BP obtained in stage 2 felt to be inaccurate); no ST changes; negative adequate ETT; Duke treadmill score 6.     EKG:  EKG is not ordered today.    Recent Labs: 12/08/2017: ALT 11; TSH 1.310 12/14/2017: BUN 13; Creatinine, Ser 0.78; Hemoglobin 13.7; Platelets 379; Potassium 3.5; Sodium 137   Recent Lipid Panel    Component Value Date/Time   CHOL 131 03/17/2018 1016   TRIG 63 03/17/2018 1016   HDL 38 (L) 03/17/2018 1016   CHOLHDL 7 12/24/2016 1142   VLDL 27.4 12/24/2016 1142   LDLCALC 80 03/17/2018 1016   LDLDIRECT 177 (H) 10/28/2010 0916    Physical Exam:    VS:  BP 122/84   Pulse 68   Ht 5\' 5"  (1.651 m)   Wt (!) 311 lb 12.8 oz (141.4 kg)   SpO2 95%   BMI 51.89 kg/m     Wt Readings from Last 3 Encounters:  05/06/18 (!) 311 lb 12.8 oz (141.4 kg)  04/27/18 (!) 309 lb (140.2 kg)  04/13/18 (!) 311 lb (141.1 kg)     Physical Exam  Constitutional: She is oriented to person, place, and time. She appears well-developed and  well-nourished. No distress.  HENT:  Head: Normocephalic and atraumatic.  Neck: Normal range of motion. Neck supple. No JVD present.  Cardiovascular: Normal rate, regular rhythm, normal heart sounds and intact distal pulses. Exam reveals no gallop and no friction rub.  No murmur heard. Pulmonary/Chest: Effort normal and breath sounds normal. No respiratory distress. She has no wheezes. She has no rales.  Abdominal: Soft. Bowel sounds are normal.  Musculoskeletal: Normal range of motion. She exhibits no edema or deformity.  Neurological: She is alert and oriented to person, place, and time.  Skin: Skin is warm and dry.  Psychiatric: She has a normal mood and affect. Her behavior is normal. Judgment and thought content normal.  Vitals reviewed.   ASSESSMENT:    1. Palpitations   2. Other chest pain   3. Class 3 severe obesity due to excess calories with serious comorbidity and body mass index (BMI) of 50.0 to 59.9 in adult ([place])   4. HYPERTENSION, BENIGN SYSTEMIC    PLAN:    In order of problems listed above:  Chest pain: Normal ETT. She has lost 30 pounds and is no longer having any chest discomfort or dyspnea.  Palpitations: Event monitor showed no arrhythmia corresponding to her palpitations.  She is no longer having palpitations since her weight loss and she feels much better.  Obesity: Body mass index is 51.89 kg/m.  She is seeing a nutritionist and working on diet and exercise.  She says that she has lost 30 pounds and feels much better.  Advised to continue her weight loss for even more improvement in how she feels.  Hyperlipidemia: She had an LDL of 223 in 11/2016.  She is on Crestor 20 mg daily and working on diet and exercise.  Her most recent LDL on 03/17/2018 is astoundingly 80.  She is congratulated on her positive lifestyle changes and encouraged to continue.  Hypertension:  Blood pressure is well controlled on current therapy.    Medication Adjustments/Labs and Tests  Ordered: Current medicines are reviewed at length with the patient today.  Concerns regarding medicines are outlined above. Labs and tests ordered and medication changes are outlined in the patient instructions below:  Patient Instructions  Medication Instructions:  Your physician recommends that you continue on your current medications as directed. Please refer to the Current Medication list given to you today.  If you need a refill on your cardiac medications before your next appointment, please call your pharmacy.   Lab work: None  If you have labs (blood work) drawn today and your tests are completely normal, you will receive your results only by: [name] MyChart Message (if you have MyChart) OR . A paper copy in the mail If you have any lab test that is abnormal or we need to change your treatment, we will call you to review the results.  Testing/Procedures: None  Follow-Up: At [facility], you and your health needs are our priority.  As part of our continuing mission to provide you with exceptional heart care, we have created designated Provider Care Teams.  These Care Teams include your primary Cardiologist (physician) and Advanced Practice Providers (APPs -  Physician Assistants and Nurse Practitioners) who all work together to provide you with the care you need, when you need it. You will need a follow up appointment in:  12 months.  Please call our office 2 months in advance to schedule this appointment.  You may see Dr. Harrington Challenger or one of the following Advanced Practice Providers on your designated Care Team: Richardson Dopp, PA-C [place], [place] . Daune Perch, NP  Any Other Special Instructions Will Be Listed Below (If Applicable).   DASH Eating Plan DASH stands for "Dietary Approaches to Stop Hypertension." The DASH eating plan is a healthy eating plan that has been shown to reduce high blood pressure (hypertension). It may also reduce your risk for type 2 diabetes, heart  disease, and stroke. The DASH eating plan may also help with weight loss. What are tips for following this plan? General guidelines  Avoid eating more than 2,300 mg (milligrams) of salt (sodium) a day. If you have hypertension, you may need to reduce your sodium intake to 1,500 mg a day.  Limit alcohol intake to no more than 1 drink a day for nonpregnant women and 2 drinks a day for men. One drink equals 12 oz of beer, 5 oz of wine, or 1 oz of hard liquor.  Work with your health care provider to maintain a healthy body weight or to lose weight. Ask what an ideal weight is for you.  Get at least 30 minutes of exercise that causes your heart to beat faster (aerobic exercise) most days of the week. Activities may include walking, swimming, or biking.  Work with your health care provider or diet and nutrition specialist (dietitian) to adjust your eating plan to your individual calorie needs. Reading food labels  Check food labels for the amount of sodium per serving. Choose foods with less than 5 percent of the Daily Value of sodium. Generally, foods with less than 300 mg of sodium per serving fit into this eating plan.  To find whole grains, look for the word "whole" as the first word in the ingredient list. Shopping  Buy products labeled as "low-sodium" or "no salt added."  Buy fresh foods. Avoid canned foods and premade or frozen meals. Cooking  Avoid adding  salt when cooking. Use salt-free seasonings or herbs instead of table salt or sea salt. Check with your health care provider or pharmacist before using salt substitutes.  Do not fry foods. Cook foods using healthy methods such as baking, boiling, grilling, and broiling instead.  Cook with heart-healthy oils, such as olive, canola, soybean, or sunflower oil. Meal planning   Eat a balanced diet that includes: ? 5 or more servings of fruits and vegetables each day. At each meal, try to fill half of your plate with fruits and  vegetables. ? Up to 6-8 servings of whole grains each day. ? Less than 6 oz of lean meat, poultry, or fish each day. A 3-oz serving of meat is about the same size as a deck of cards. One egg equals 1 oz. ? 2 servings of low-fat dairy each day. ? A serving of nuts, seeds, or beans 5 times each week. ? Heart-healthy fats. Healthy fats called Omega-3 fatty acids are found in foods such as flaxseeds and coldwater fish, like sardines, salmon, and mackerel.  Limit how much you eat of the following: ? Canned or prepackaged foods. ? Food that is high in trans fat, such as fried foods. ? Food that is high in saturated fat, such as fatty meat. ? Sweets, desserts, sugary drinks, and other foods with added sugar. ? Full-fat dairy products.  Do not salt foods before eating.  Try to eat at least 2 vegetarian meals each week.  Eat more home-cooked food and less restaurant, buffet, and fast food.  When eating at a restaurant, ask that your food be prepared with less salt or no salt, if possible. What foods are recommended? The items listed may not be a complete list. Talk with your dietitian about what dietary choices are best for you. Grains Whole-grain or whole-wheat bread. Whole-grain or whole-wheat pasta. Brown rice. [place]. Bulgur. Whole-grain and low-sodium cereals. Pita bread. Low-fat, low-sodium crackers. Whole-wheat flour tortillas. Vegetables Fresh or frozen vegetables (raw, steamed, roasted, or grilled). Low-sodium or reduced-sodium tomato and vegetable juice. Low-sodium or reduced-sodium tomato sauce and tomato paste. Low-sodium or reduced-sodium canned vegetables. Fruits All fresh, dried, or frozen fruit. Canned fruit in natural juice (without added sugar). Meat and other protein foods Skinless chicken or [place]. Ground chicken or [place]. Pork with fat trimmed off. Fish and seafood. Egg whites. Dried beans, peas, or lentils. Unsalted nuts, nut butters, and seeds. Unsalted canned  beans. Lean cuts of beef with fat trimmed off. Low-sodium, lean deli meat. Dairy Low-fat (1%) or fat-free (skim) milk. Fat-free, low-fat, or reduced-fat cheeses. Nonfat, low-sodium ricotta or cottage cheese. Low-fat or nonfat yogurt. Low-fat, low-sodium cheese. Fats and oils Soft margarine without trans fats. Vegetable oil. Low-fat, reduced-fat, or light mayonnaise and salad dressings (reduced-sodium). Canola, safflower, olive, soybean, and sunflower oils. Avocado. Seasoning and other foods Herbs. Spices. Seasoning mixes without salt. Unsalted popcorn and pretzels. Fat-free sweets. What foods are not recommended? The items listed may not be a complete list. Talk with your dietitian about what dietary choices are best for you. Grains Baked goods made with fat, such as croissants, muffins, or some breads. Dry pasta or rice meal packs. Vegetables Creamed or fried vegetables. Vegetables in a cheese sauce. Regular canned vegetables (not low-sodium or reduced-sodium). Regular canned tomato sauce and paste (not low-sodium or reduced-sodium). Regular tomato and vegetable juice (not low-sodium or reduced-sodium). Angie Fava. Olives. Fruits Canned fruit in a light or heavy syrup. Fried fruit. Fruit in cream or butter sauce. Meat and  other protein foods Fatty cuts of meat. Ribs. Fried meat. Berniece Salines. Sausage. Bologna and other processed lunch meats. Salami. Fatback. Hotdogs. Bratwurst. Salted nuts and seeds. Canned beans with added salt. Canned or smoked fish. Whole eggs or egg yolks. Chicken or [place] with skin. Dairy Whole or 2% milk, cream, and half-and-half. Whole or full-fat cream cheese. Whole-fat or sweetened yogurt. Full-fat cheese. Nondairy creamers. Whipped toppings. Processed cheese and cheese spreads. Fats and oils Butter. Stick margarine. Lard. Shortening. Ghee. Bacon fat. Tropical oils, such as coconut, palm kernel, or palm oil. Seasoning and other foods Salted popcorn and pretzels. Onion salt,  garlic salt, seasoned salt, table salt, and sea salt. Worcestershire sauce. Tartar sauce. Barbecue sauce. Teriyaki sauce. Soy sauce, including reduced-sodium. Steak sauce. Canned and packaged gravies. Fish sauce. Oyster sauce. Cocktail sauce. Horseradish that you find on the shelf. Ketchup. Mustard. Meat flavorings and tenderizers. Bouillon cubes. Hot sauce and Tabasco sauce. Premade or packaged marinades. Premade or packaged taco seasonings. Relishes. Regular salad dressings. Where to find more information:  National Heart, Lung, and [place]: https://wilson-eaton.com/  American Heart Association: www.heart.org Summary  The DASH eating plan is a healthy eating plan that has been shown to reduce high blood pressure (hypertension). It may also reduce your risk for type 2 diabetes, heart disease, and stroke.  With the DASH eating plan, you should limit salt (sodium) intake to 2,300 mg a day. If you have hypertension, you may need to reduce your sodium intake to 1,500 mg a day.  When on the DASH eating plan, aim to eat more fresh fruits and vegetables, whole grains, lean proteins, low-fat dairy, and heart-healthy fats.  Work with your health care provider or diet and nutrition specialist (dietitian) to adjust your eating plan to your individual calorie needs. This information is not intended to replace advice given to you by your health care provider. Make sure you discuss any questions you have with your health care provider. Document Released: 07/02/2011 Document Revised: 07/06/2016 Document Reviewed: 07/06/2016 Elsevier Interactive Patient Education  2018 [place], Daune Perch, NP  05/06/2018 3:29 PM    [place]

## 2018-05-06 NOTE — Patient Instructions (Addendum)
Medication Instructions:  Your physician recommends that you continue on your current medications as directed. Please refer to the Current Medication list given to you today.  If you need a refill on your cardiac medications before your next appointment, please call your pharmacy.   Lab work: None  If you have labs (blood work) drawn today and your tests are completely normal, you will receive your results only by: [name] MyChart Message (if you have MyChart) OR . A paper copy in the mail If you have any lab test that is abnormal or we need to change your treatment, we will call you to review the results.  Testing/Procedures: None  Follow-Up: At [facility], you and your health needs are our priority.  As part of our continuing mission to provide you with exceptional heart care, we have created designated Provider Care Teams.  These Care Teams include your primary Cardiologist (physician) and Advanced Practice Providers (APPs -  Physician Assistants and Nurse Practitioners) who all work together to provide you with the care you need, when you need it. You will need a follow up appointment in:  12 months.  Please call our office 2 months in advance to schedule this appointment.  You may see Dr. Harrington Challenger or one of the following Advanced Practice Providers on your designated Care Team: Richardson Dopp, PA-C [place], [place] . Daune Perch, NP  Any Other Special Instructions Will Be Listed Below (If Applicable).   DASH Eating Plan DASH stands for "Dietary Approaches to Stop Hypertension." The DASH eating plan is a healthy eating plan that has been shown to reduce high blood pressure (hypertension). It may also reduce your risk for type 2 diabetes, heart disease, and stroke. The DASH eating plan may also help with weight loss. What are tips for following this plan? General guidelines  Avoid eating more than 2,300 mg (milligrams) of salt (sodium) a day. If you have hypertension, you may need to  reduce your sodium intake to 1,500 mg a day.  Limit alcohol intake to no more than 1 drink a day for nonpregnant women and 2 drinks a day for men. One drink equals 12 oz of beer, 5 oz of wine, or 1 oz of hard liquor.  Work with your health care provider to maintain a healthy body weight or to lose weight. Ask what an ideal weight is for you.  Get at least 30 minutes of exercise that causes your heart to beat faster (aerobic exercise) most days of the week. Activities may include walking, swimming, or biking.  Work with your health care provider or diet and nutrition specialist (dietitian) to adjust your eating plan to your individual calorie needs. Reading food labels  Check food labels for the amount of sodium per serving. Choose foods with less than 5 percent of the Daily Value of sodium. Generally, foods with less than 300 mg of sodium per serving fit into this eating plan.  To find whole grains, look for the word "whole" as the first word in the ingredient list. Shopping  Buy products labeled as "low-sodium" or "no salt added."  Buy fresh foods. Avoid canned foods and premade or frozen meals. Cooking  Avoid adding salt when cooking. Use salt-free seasonings or herbs instead of table salt or sea salt. Check with your health care provider or pharmacist before using salt substitutes.  Do not fry foods. Cook foods using healthy methods such as baking, boiling, grilling, and broiling instead.  Cook with heart-healthy oils, such as  olive, canola, soybean, or sunflower oil. Meal planning   Eat a balanced diet that includes: ? 5 or more servings of fruits and vegetables each day. At each meal, try to fill half of your plate with fruits and vegetables. ? Up to 6-8 servings of whole grains each day. ? Less than 6 oz of lean meat, poultry, or fish each day. A 3-oz serving of meat is about the same size as a deck of cards. One egg equals 1 oz. ? 2 servings of low-fat dairy each day. ? A  serving of nuts, seeds, or beans 5 times each week. ? Heart-healthy fats. Healthy fats called Omega-3 fatty acids are found in foods such as flaxseeds and coldwater fish, like sardines, salmon, and mackerel.  Limit how much you eat of the following: ? Canned or prepackaged foods. ? Food that is high in trans fat, such as fried foods. ? Food that is high in saturated fat, such as fatty meat. ? Sweets, desserts, sugary drinks, and other foods with added sugar. ? Full-fat dairy products.  Do not salt foods before eating.  Try to eat at least 2 vegetarian meals each week.  Eat more home-cooked food and less restaurant, buffet, and fast food.  When eating at a restaurant, ask that your food be prepared with less salt or no salt, if possible. What foods are recommended? The items listed may not be a complete list. Talk with your dietitian about what dietary choices are best for you. Grains Whole-grain or whole-wheat bread. Whole-grain or whole-wheat pasta. Brown rice. [place] Morrow. Bulgur. Whole-grain and low-sodium cereals. Pita bread. Low-fat, low-sodium crackers. Whole-wheat flour tortillas. Vegetables Fresh or frozen vegetables (raw, steamed, roasted, or grilled). Low-sodium or reduced-sodium tomato and vegetable juice. Low-sodium or reduced-sodium tomato sauce and tomato paste. Low-sodium or reduced-sodium canned vegetables. Fruits All fresh, dried, or frozen fruit. Canned fruit in natural juice (without added sugar). Meat and other protein foods Skinless chicken or [place]. Ground chicken or [place]. Pork with fat trimmed off. Fish and seafood. Egg whites. Dried beans, peas, or lentils. Unsalted nuts, nut butters, and seeds. Unsalted canned beans. Lean cuts of beef with fat trimmed off. Low-sodium, lean deli meat. Dairy Low-fat (1%) or fat-free (skim) milk. Fat-free, low-fat, or reduced-fat cheeses. Nonfat, low-sodium ricotta or cottage cheese. Low-fat or nonfat yogurt. Low-fat,  low-sodium cheese. Fats and oils Soft margarine without trans fats. Vegetable oil. Low-fat, reduced-fat, or light mayonnaise and salad dressings (reduced-sodium). Canola, safflower, olive, soybean, and sunflower oils. Avocado. Seasoning and other foods Herbs. Spices. Seasoning mixes without salt. Unsalted popcorn and pretzels. Fat-free sweets. What foods are not recommended? The items listed may not be a complete list. Talk with your dietitian about what dietary choices are best for you. Grains Baked goods made with fat, such as croissants, muffins, or some breads. Dry pasta or rice meal packs. Vegetables Creamed or fried vegetables. Vegetables in a cheese sauce. Regular canned vegetables (not low-sodium or reduced-sodium). Regular canned tomato sauce and paste (not low-sodium or reduced-sodium). Regular tomato and vegetable juice (not low-sodium or reduced-sodium). Angie Fava. Olives. Fruits Canned fruit in a light or heavy syrup. Fried fruit. Fruit in cream or butter sauce. Meat and other protein foods Fatty cuts of meat. Ribs. Fried meat. Berniece Salines. Sausage. Bologna and other processed lunch meats. Salami. Fatback. Hotdogs. Bratwurst. Salted nuts and seeds. Canned beans with added salt. Canned or smoked fish. Whole eggs or egg yolks. Chicken or [place] with skin. Dairy Whole or 2% milk, cream, and  half-and-half. Whole or full-fat cream cheese. Whole-fat or sweetened yogurt. Full-fat cheese. Nondairy creamers. Whipped toppings. Processed cheese and cheese spreads. Fats and oils Butter. Stick margarine. Lard. Shortening. Ghee. Bacon fat. Tropical oils, such as coconut, palm kernel, or palm oil. Seasoning and other foods Salted popcorn and pretzels. Onion salt, garlic salt, seasoned salt, table salt, and sea salt. Worcestershire sauce. Tartar sauce. Barbecue sauce. Teriyaki sauce. Soy sauce, including reduced-sodium. Steak sauce. Canned and packaged gravies. Fish sauce. Oyster sauce. Cocktail sauce.  Horseradish that you find on the shelf. Ketchup. Mustard. Meat flavorings and tenderizers. Bouillon cubes. Hot sauce and Tabasco sauce. Premade or packaged marinades. Premade or packaged taco seasonings. Relishes. Regular salad dressings. Where to find more information:  National Heart, Lung, and [place]: https://wilson-eaton.com/  American Heart Association: www.heart.org Summary  The DASH eating plan is a healthy eating plan that has been shown to reduce high blood pressure (hypertension). It may also reduce your risk for type 2 diabetes, heart disease, and stroke.  With the DASH eating plan, you should limit salt (sodium) intake to 2,300 mg a day. If you have hypertension, you may need to reduce your sodium intake to 1,500 mg a day.  When on the DASH eating plan, aim to eat more fresh fruits and vegetables, whole grains, lean proteins, low-fat dairy, and heart-healthy fats.  Work with your health care provider or diet and nutrition specialist (dietitian) to adjust your eating plan to your individual calorie needs. This information is not intended to replace advice given to you by your health care provider. Make sure you discuss any questions you have with your health care provider. Document Released: 07/02/2011 Document Revised: 07/06/2016 Document Reviewed: 07/06/2016 Elsevier Interactive Patient Education  [employer].

## 2018-05-11 DIAGNOSIS — G4733 Obstructive sleep apnea (adult) (pediatric): Secondary | ICD-10-CM | POA: Diagnosis not present

## 2018-05-12 ENCOUNTER — Ambulatory Visit (INDEPENDENT_AMBULATORY_CARE_PROVIDER_SITE_OTHER): Payer: BLUE CROSS/BLUE SHIELD | Admitting: Family Medicine

## 2018-05-17 ENCOUNTER — Ambulatory Visit (INDEPENDENT_AMBULATORY_CARE_PROVIDER_SITE_OTHER): Payer: BLUE CROSS/BLUE SHIELD | Admitting: Family Medicine

## 2018-05-17 VITALS — BP 147/82 | HR 69 | Temp 98.4°F | Ht 65.0 in | Wt 306.0 lb

## 2018-05-17 DIAGNOSIS — E559 Vitamin D deficiency, unspecified: Secondary | ICD-10-CM

## 2018-05-17 DIAGNOSIS — I10 Essential (primary) hypertension: Secondary | ICD-10-CM

## 2018-05-17 DIAGNOSIS — Z6841 Body Mass Index (BMI) 40.0 and over, adult: Secondary | ICD-10-CM | POA: Diagnosis not present

## 2018-05-18 ENCOUNTER — Ambulatory Visit: Payer: Self-pay | Admitting: *Deleted

## 2018-05-18 ENCOUNTER — Encounter (INDEPENDENT_AMBULATORY_CARE_PROVIDER_SITE_OTHER): Payer: Self-pay | Admitting: Family Medicine

## 2018-05-18 NOTE — Telephone Encounter (Signed)
Patient called with having nausea and vomiting. Having pain from the back of her head to left shoulder. she states that has vomited 3 times today. This started over the weekend with a runny nose and slight headache. She felt better on Monday. Tuesday, slight headache and shooting pain from the back of her head to around her ear. She took ibuprofen with some relief. Last night became nauseated and vomited.  This morning she woke up with the the pain at the back of head to left arm. she took an Excedrin that she normally takes for migraine and that did help her.   She has used cold and heat with her pain,. Does not feel like this is helping much. She has made an appt for tomorrow with her provider. Appointment was already scheduled before triage. She stated that if her symptoms get worst, she will be going to the closest emergency department.  Will route to [facility] at [facility].   Reason for Disposition . Numbness (i.e., loss of sensation) in hand or fingers  Answer Assessment - Initial Assessment Questions 1. ONSET: "When did the pain start?"     Since the past weekend 2. LOCATION: "Where is the pain located?"     Back of head to left shoulder 3. PAIN: "How bad is the pain?" (Scale 1-10; or mild, moderate, severe)   - MILD (1-3): doesn't interfere with normal activities   - MODERATE (4-7): interferes with normal activities (e.g., work or school) or awakens from sleep   - SEVERE (8-10): excruciating pain, unable to do any normal activities, unable to move arm at all due to pain     Pain # 6 4. WORK OR EXERCISE: "Has there been any recent work or exercise that involved this part of the body?"     no 5. CAUSE: "What do you think is causing the shoulder pain?"     Not sure 6. OTHER SYMPTOMS: "Do you have any other symptoms?" (e.g., neck pain, swelling, rash, fever, numbness, weakness)     Neck pain, some weakness in left arm (hx of carpel tunnel), little bit of  numbness in fingertips 7. PREGNANCY:  "Is there any chance you are pregnant?" "When was your last menstrual period?" Not  pregnant, using mirena .  Protocols used: SHOULDER PAIN-A-AH

## 2018-05-18 NOTE — Telephone Encounter (Signed)
Noted  

## 2018-05-19 ENCOUNTER — Other Ambulatory Visit (INDEPENDENT_AMBULATORY_CARE_PROVIDER_SITE_OTHER): Payer: BLUE CROSS/BLUE SHIELD

## 2018-05-19 ENCOUNTER — Encounter (INDEPENDENT_AMBULATORY_CARE_PROVIDER_SITE_OTHER): Payer: Self-pay

## 2018-05-19 ENCOUNTER — Ambulatory Visit (INDEPENDENT_AMBULATORY_CARE_PROVIDER_SITE_OTHER): Payer: BLUE CROSS/BLUE SHIELD | Admitting: Internal Medicine

## 2018-05-19 ENCOUNTER — Encounter: Payer: Self-pay | Admitting: Internal Medicine

## 2018-05-19 VITALS — BP 138/90 | HR 96 | Temp 98.7°F | Ht 65.0 in | Wt 308.0 lb

## 2018-05-19 DIAGNOSIS — R112 Nausea with vomiting, unspecified: Secondary | ICD-10-CM

## 2018-05-19 DIAGNOSIS — M542 Cervicalgia: Secondary | ICD-10-CM | POA: Diagnosis not present

## 2018-05-19 DIAGNOSIS — R0602 Shortness of breath: Secondary | ICD-10-CM | POA: Diagnosis not present

## 2018-05-19 DIAGNOSIS — J40 Bronchitis, not specified as acute or chronic: Secondary | ICD-10-CM | POA: Diagnosis not present

## 2018-05-19 DIAGNOSIS — R1110 Vomiting, unspecified: Secondary | ICD-10-CM | POA: Insufficient documentation

## 2018-05-19 DIAGNOSIS — K921 Melena: Secondary | ICD-10-CM

## 2018-05-19 LAB — COMPREHENSIVE METABOLIC PANEL
ALBUMIN: 4.1 g/dL (ref 3.5–5.2)
ALT: 12 U/L (ref 0–35)
AST: 11 U/L (ref 0–37)
Alkaline Phosphatase: 79 U/L (ref 39–117)
BUN: 11 mg/dL (ref 6–23)
CALCIUM: 9.4 mg/dL (ref 8.4–10.5)
CHLORIDE: 102 meq/L (ref 96–112)
CO2: 29 meq/L (ref 19–32)
Creatinine, Ser: 0.7 mg/dL (ref 0.40–1.20)
GFR: 116.04 mL/min (ref >60.00)
Glucose, Bld: 88 mg/dL (ref 70–99)
POTASSIUM: 3.7 meq/L (ref 3.5–5.1)
Sodium: 139 mEq/L (ref 135–145)
Total Bilirubin: 0.4 mg/dL (ref 0.2–1.2)
Total Protein: 7.9 g/dL (ref 6.0–8.3)

## 2018-05-19 LAB — CBC
HEMATOCRIT: 38.2 % (ref 36.0–46.0)
Hemoglobin: 12.9 g/dL (ref 12.0–15.0)
MCHC: 33.8 g/dL (ref 30.0–36.0)
MCV: 86.1 fl (ref 78.0–100.0)
Platelets: 347 10*3/uL (ref 150.0–400.0)
RBC: 4.43 Mil/uL (ref 3.87–5.11)
RDW: 14.2 % (ref 11.5–15.5)
WBC: 4.7 10*3/uL (ref 4.0–10.5)

## 2018-05-19 MED ORDER — METHYLPREDNISOLONE ACETATE 40 MG/ML IJ SUSP
40.0000 mg | Freq: Once | INTRAMUSCULAR | Status: AC
  Administered 2018-05-19: 40 mg via INTRAMUSCULAR

## 2018-05-19 MED ORDER — ONDANSETRON HCL 4 MG PO TABS: 4.0000 mg | ORAL_TABLET | Freq: Three times a day (TID) | ORAL | 0 refills | Status: DC | PRN

## 2018-05-19 MED ORDER — AZITHROMYCIN 250 MG PO TABS: ORAL_TABLET | ORAL | 0 refills | Status: DC

## 2018-05-19 MED ORDER — ALBUTEROL SULFATE HFA 108 (90 BASE) MCG/ACT IN AERS: INHALATION_SPRAY | RESPIRATORY_TRACT | 1 refills | Status: DC

## 2018-05-19 NOTE — Progress Notes (Signed)
   Subjective:    Patient ID: Danielle Shaw, female    DOB: 1970-10-25, 47 y.o.   MRN: [id number]  HPI The patient is a 47 YO female coming in for sinus pressure and congestion (started about 1 week ago with pressure in sinuses and neck, she is having drainage and coughing up green sputum, this feels like it is getting caught in her throat and she is vomiting some, she is not taking anything for this, overall is worsening) as well as vomiting (she is coughing so much she is vomiting, she then started vomiting 2-3 days ago, throwing up 2-3 times per day, not much appetite and drinking some liquids only, was able to keep down some water last night, not eaten anything today, did drink minimal fluids today, denies diarrhea or constipation, denies abdominal pain, some chronic blood in stool recently) and neck pain (she started having some left neck pain with the coughing, overall worsening, started 2-3 days ago, pain 5-6/10, did take tylenol over the counter without relief, denies numbness or tingling in her arms).    Review of Systems  Constitutional: Positive for activity change, appetite change and chills. Negative for fatigue, fever and unexpected weight change.  HENT: Positive for congestion, postnasal drip, rhinorrhea and sinus pressure. Negative for ear discharge, ear pain, sinus pain, sneezing, sore throat, tinnitus, trouble swallowing and voice change.   Eyes: Negative.   Respiratory: Positive for cough and shortness of breath. Negative for chest tightness and wheezing.   Cardiovascular: Negative.   Gastrointestinal: Positive for nausea and vomiting.  Musculoskeletal: Positive for arthralgias, myalgias and neck pain.  Skin: Negative.   Neurological: Negative.   Psychiatric/Behavioral: Negative.       Objective:   Physical Exam  Constitutional: She is oriented to person, place, and time. She appears well-developed and well-nourished.  overweight  HENT:  Head: Normocephalic and  atraumatic.  Oropharynx with redness and clear drainage, nose with swollen turbinates, TMs normal bilaterally  Eyes: EOM are normal.  Neck: Normal range of motion. No thyromegaly present.  Cardiovascular: Normal rate and regular rhythm.  Pulmonary/Chest: Effort normal and breath sounds normal. No respiratory distress. She has no wheezes. She has no rales.  Mild rhonchi left more than right  Abdominal: Soft. Bowel sounds are normal. She exhibits no distension and no mass. There is no tenderness. There is no rebound and no guarding. No hernia.  Musculoskeletal: She exhibits tenderness.  Muscle spasm and tightness in the left neck region and shoulder, no radiation of pain  Lymphadenopathy:    She has no cervical adenopathy.  Neurological: She is alert and oriented to person, place, and time.  Skin: Skin is warm and dry.  Psychiatric: She has a normal mood and affect.   Vitals:   05/19/18 1102  BP: 138/90  Pulse: 96  Temp: 98.7 F (37.1 C)  TempSrc: Oral  SpO2: 94%  Weight: (!) 308 lb (139.7 kg)  Height: 5\' 5"  (1.651 m)      Assessment & Plan:  Depo-medrol 40 mg IM

## 2018-05-19 NOTE — Assessment & Plan Note (Signed)
Could be related to sinus drainage or illness. Will rx zofran to help with nausea and encouraged her to push fluids. Given no liquids for days checking CBC and CMP to rule out AKI or dehydration or electrolyte abnormality.

## 2018-05-19 NOTE — Patient Instructions (Signed)
We have sent in zofran to use if needed for nausea.   We have sent in azithromycin (z-pack) to take 2 pills today, then 1 pill daily starting tomorrow until gone.   Call back if you are not doing better and remember to drink plenty of fluids.

## 2018-05-19 NOTE — Assessment & Plan Note (Signed)
Given coughing leading to vomiting will cover with azithromycin. Given depo-medrol as well for lungs.

## 2018-05-19 NOTE — Assessment & Plan Note (Signed)
Patient mentioned briefly during visit. Likely internal hemorrhoids as she has some chronic constipation but referral to GI for consideration of colonoscopy as this is persistent in the last several months ("a minute").

## 2018-05-19 NOTE — Assessment & Plan Note (Signed)
Depo-medrol 40 mg IM given at visit for muscular sprain, possible related to coughing.

## 2018-05-23 NOTE — Progress Notes (Signed)
Office: [phone]  /  Fax: [phone]   HPI:   Chief Complaint: OBESITY Danielle Shaw is here to discuss her progress with her obesity treatment plan. She is on the Category 3 plan and is following her eating plan approximately 95 % of the time. She states she is doing cardio and lifting weights 15 to 20 minutes 2 to 3 times per week. Danielle Shaw had an upper respiratory infection for the last five days. She was feeling under the weather and she didn't eat much. [employer] home[employer] is this weekend ans she wants to start exercising again. Her weight is (!) 306 lb (138.8 kg) today and has had a weight loss of 3 pounds over a period of 3 weeks since her last visit. She has lost 27 lbs since starting treatment with [place].  Hypertension Danielle Shaw is a 47 y.o. female with hypertension. Her blood pressure is slightly elevated today. Danielle Shaw denies chest pain, chest pressure or headache. She is working weight loss to help control her blood pressure with the goal of decreasing her risk of heart attack and stroke. Danielle Shaw blood pressure is not currently controlled.  Vitamin D deficiency Danielle Shaw has a diagnosis of vitamin D deficiency. Danielle Shaw is currently taking vit D and she admits fatigue, but she denies nausea, vomiting or muscle weakness.  ALLERGIES: Allergies  Allergen Reactions  . Latex   . Lisinopril Cough  . Metoprolol Other (See Comments)    Funny feeling    MEDICATIONS: Current Outpatient Medications on File Prior to Visit  Medication Sig Dispense Refill  . aspirin 81 MG tablet Take 81 mg by mouth daily.     . cyclobenzaprine (FLEXERIL) 5 MG tablet Take 1 tablet (5 mg total) by mouth 3 (three) times daily as needed for muscle spasms. 30 tablet 1  . HYDROcodone-homatropine (HYCODAN) 5-1.5 MG/5ML syrup Take 5-10 mLs by mouth at bedtime as needed for cough. 50 mL 0  . ipratropium (ATROVENT) 0.03 % nasal spray Place 2 sprays into both nostrils every 12 (twelve) hours. 30 mL 12  .  levocetirizine (XYZAL) 5 MG tablet Take 5 mg by mouth every evening.    [name] losartan-hydrochlorothiazide (HYZAAR) 50-12.5 MG tablet Take 1 tablet by mouth daily. 30 tablet 0  . meloxicam (MOBIC) 15 MG tablet Take 1 tablet (15 mg total) by mouth daily as needed for pain. 30 tablet 1  . metFORMIN (GLUCOPHAGE) 500 MG tablet Take 1 tablet (500 mg total) by mouth daily with breakfast. 30 tablet 0  . modafinil (PROVIGIL) 200 MG tablet Take 1 tablet (200 mg total) by mouth daily. 30 tablet 5  . pantoprazole (PROTONIX) 40 MG tablet Take 1 tablet (40 mg total) by mouth daily. 90 tablet 3  . rosuvastatin (CRESTOR) 20 MG tablet Take 1 tablet (20 mg total) by mouth daily. 30 tablet 0  . traMADol (ULTRAM) 50 MG tablet Take 1 tablet (50 mg total) by mouth every 8 (eight) hours as needed. 30 tablet 0  . Vitamin D, Ergocalciferol, (DRISDOL) 50000 units CAPS capsule Take 1 capsule (50,000 Units total) by mouth every 7 (seven) days. 4 capsule 0   No current facility-administered medications on file prior to visit.     PAST MEDICAL HISTORY: Past Medical History:  Diagnosis Date  . Arrhythmia   . Arthritis   . GERD (gastroesophageal reflux disease)   . Hyperlipidemia   . Hypertension   . IUD 2004  . IUD 2009  . Migraine   . SOB (shortness of  breath)     PAST SURGICAL HISTORY: Past Surgical History:  Procedure Laterality Date  . BREAST BIOPSY Right 2011  . BREAST SURGERY  2000   reduction  . CESAREAN SECTION      SOCIAL HISTORY: Social History   Tobacco Use  . Smoking status: Never Smoker  . Smokeless tobacco: Never Used  Substance Use Topics  . Alcohol use: No    Alcohol/week: 0.0 standard drinks  . Drug use: No    FAMILY HISTORY: Family History  Problem Relation Age of Onset  . Heart disease Mother 49  . High Cholesterol Mother   . High blood pressure Mother   . Diabetes Father   . Heart disease Father   . High blood pressure Father   . High Cholesterol Father   . Heart disease  Maternal Grandmother   . Hodgkin's lymphoma Daughter     ROS: Review of Systems  Constitutional: Positive for malaise/fatigue and weight loss.  Cardiovascular: Negative for chest pain.       Negative for chest pressure  Gastrointestinal: Negative for nausea and vomiting.  Musculoskeletal:       Negative for muscle weakness  Neurological: Negative for headaches.    PHYSICAL EXAM: Blood pressure (!) 147/82, pulse 69, temperature 98.4 F (36.9 C), temperature source Oral, height 5\' 5"  (1.651 m), weight (!) 306 lb (138.8 kg), SpO2 95 %. Body mass index is 50.92 kg/m. Physical Exam  Constitutional: She is oriented to person, place, and time. She appears well-developed and well-nourished.  Cardiovascular: Normal rate.  Pulmonary/Chest: Effort normal.  Musculoskeletal: Normal range of motion.  Neurological: She is oriented to person, place, and time.  Skin: Skin is warm and dry.  Psychiatric: She has a normal mood and affect. Her behavior is normal.  Vitals reviewed.   RECENT LABS AND TESTS: BMET    Component Value Date/Time   NA 139 05/19/2018 1136   NA 139 12/08/2017 1201   K 3.7 05/19/2018 1136   CL 102 05/19/2018 1136   CO2 29 05/19/2018 1136   GLUCOSE 88 05/19/2018 1136   BUN 11 05/19/2018 1136   BUN 10 12/08/2017 1201   CREATININE 0.70 05/19/2018 1136   CREATININE 0.64 02/01/2014 1005   CALCIUM 9.4 05/19/2018 1136   GFRNONAA >60 12/14/2017 2303   GFRAA >60 12/14/2017 2303   Lab Results  Component Value Date   HGBA1C 6.0 (H) 03/17/2018   HGBA1C 6.2 (H) 12/08/2017   HGBA1C 6.4 12/24/2016   HGBA1C 6.0 01/21/2016   HGBA1C 6.0 05/03/2015   Lab Results  Component Value Date   INSULIN 16.3 03/17/2018   INSULIN 20.9 12/08/2017   CBC    Component Value Date/Time   WBC 4.7 05/19/2018 1136   RBC 4.43 05/19/2018 1136   HGB 12.9 05/19/2018 1136   HGB 12.9 12/08/2017 1201   HCT 38.2 05/19/2018 1136   HCT 39.5 12/08/2017 1201   PLT 347.0 05/19/2018 1136   MCV  86.1 05/19/2018 1136   MCV 89 12/08/2017 1201   MCH 29.5 12/14/2017 2303   MCHC 33.8 05/19/2018 1136   RDW 14.2 05/19/2018 1136   RDW 15.0 12/08/2017 1201   LYMPHSABS 2.0 12/08/2017 1201   MONOABS 0.3 02/01/2014 1005   EOSABS 0.4 12/08/2017 1201   BASOSABS 0.0 12/08/2017 1201   Iron/TIBC/Ferritin/ %Sat No results found for: IRON, TIBC, FERRITIN, IRONPCTSAT Lipid Panel     Component Value Date/Time   CHOL 131 03/17/2018 1016   TRIG 63 03/17/2018 1016   HDL  38 (L) 03/17/2018 1016   CHOLHDL 7 12/24/2016 1142   VLDL 27.4 12/24/2016 1142   LDLCALC 80 03/17/2018 1016   LDLDIRECT 177 (H) 10/28/2010 0916   Hepatic Function Panel     Component Value Date/Time   PROT 7.9 05/19/2018 1136   PROT 7.4 12/08/2017 1201   ALBUMIN 4.1 05/19/2018 1136   ALBUMIN 4.2 12/08/2017 1201   AST 11 05/19/2018 1136   ALT 12 05/19/2018 1136   ALKPHOS 79 05/19/2018 1136   BILITOT 0.4 05/19/2018 1136   BILITOT 0.4 12/08/2017 1201      Component Value Date/Time   TSH 1.310 12/08/2017 1201   TSH 1.125 02/01/2014 1005   TSH 1.310 05/06/2011 1131   Results for ADAYAH, AROCHO (MRN [id number]) as of 05/23/2018 10:52  Ref. Range 03/17/2018 10:16  Vitamin D, 25-Hydroxy Latest Ref Range: 30.0 - 100.0 ng/mL 29.3 (L)   ASSESSMENT AND PLAN: Essential hypertension  Vitamin D deficiency  Class 3 severe obesity with serious comorbidity and body mass index (BMI) of 50.0 to 59.9 in adult, unspecified obesity type (Danielle Shaw)  PLAN:  Hypertension We discussed sodium restriction, working on healthy weight loss, and a regular exercise program as the means to achieve improved blood pressure control. Danielle Shaw agreed with this plan and agreed to follow up as directed. We will continue to monitor her blood pressure as well as her progress with the above lifestyle modifications. She will continue her medications as prescribed and will watch for signs of hypotension as she continues her lifestyle modifications.  Vitamin D  Deficiency Danielle Shaw was informed that low vitamin D levels contributes to fatigue and are associated with obesity, breast, and colon cancer. She agrees to continue to take prescription Vit D @50 ,000 IU every week (no refill needed) and will follow up for routine testing of vitamin D, at least 2-3 times per year. She was informed of the risk of over-replacement of vitamin D and agrees to not increase her dose unless she discusses this with [place] first.  I spent > than 50% of the 15 minute visit on counseling as documented in the note.  Obesity Danielle Shaw is currently in the action stage of change. As such, her goal is to continue with weight loss efforts She has agreed to follow the Category 3 plan Danielle Shaw has been instructed to work up to a goal of 150 minutes of combined cardio and strengthening exercise per week for weight loss and overall health benefits. We discussed the following Behavioral Modification Strategies today: better snacking choices, planning for success, increasing lean protein intake, increasing vegetables and work on meal planning and easy cooking plans  Danielle Shaw has agreed to follow up with our clinic in 2 weeks. She was informed of the importance of frequent follow up visits to maximize her success with intensive lifestyle modifications for her multiple health conditions.   OBESITY BEHAVIORAL INTERVENTION VISIT  Today's visit was # 11   Starting weight: 335 lbs Starting date: 12/08/17 Today's weight : 308 lbs Today's date: 05/17/2018 Total lbs lost to date: 66   ASK: We discussed the diagnosis of obesity with Danielle Shaw today and Danielle Shaw agreed to give [place] permission to discuss obesity behavioral modification therapy today.  ASSESS: Danielle Shaw has the diagnosis of obesity and her BMI today is 51.25 Danielle Shaw is in the action stage of change   ADVISE: Danielle Shaw was educated on the multiple health risks of obesity as well as the benefit of weight loss to improve her health. She was  advised  of the need for long term treatment and the importance of lifestyle modifications to improve her current health and to decrease her risk of future health problems.  AGREE: Multiple dietary modification options and treatment options were discussed and  Danielle Shaw agreed to follow the recommendations documented in the above note.  ARRANGE: Danielle Shaw was educated on the importance of frequent visits to treat obesity as outlined per CMS and USPSTF guidelines and agreed to schedule her next follow up appointment today.  I, Doreene Nest, am acting as transcriptionist for Eber Jones, MD  I have reviewed the above documentation for accuracy and completeness, and I agree with the above. - Ilene Qua, MD

## 2018-05-30 ENCOUNTER — Ambulatory Visit (INDEPENDENT_AMBULATORY_CARE_PROVIDER_SITE_OTHER): Payer: BLUE CROSS/BLUE SHIELD | Admitting: Family Medicine

## 2018-05-30 VITALS — BP 135/85 | HR 71 | Temp 98.1°F | Ht 65.0 in | Wt 300.0 lb

## 2018-05-30 DIAGNOSIS — Z9189 Other specified personal risk factors, not elsewhere classified: Secondary | ICD-10-CM

## 2018-05-30 DIAGNOSIS — I10 Essential (primary) hypertension: Secondary | ICD-10-CM | POA: Diagnosis not present

## 2018-05-30 DIAGNOSIS — E559 Vitamin D deficiency, unspecified: Secondary | ICD-10-CM

## 2018-05-30 DIAGNOSIS — Z6841 Body Mass Index (BMI) 40.0 and over, adult: Secondary | ICD-10-CM

## 2018-05-30 MED ORDER — VITAMIN D (ERGOCALCIFEROL) 1.25 MG (50000 UNIT) PO CAPS: 50000.0000 [IU] | ORAL_CAPSULE | ORAL | 0 refills | Status: DC

## 2018-05-30 NOTE — Progress Notes (Signed)
Office: [phone]  /  Fax: [phone]   HPI:   Chief Complaint: OBESITY Danielle Shaw is here to discuss her progress with her obesity treatment plan. She is on the Category 3 plan and is following her eating plan approximately 98 % of the time. She states she is exercising 0 minutes 0 times per week. Danielle Shaw is feeling better than she did at last visit. She finally started eating and getting back on the meal plan 5 days ago. She is not anticipating any problems with following the meal plan in the next 2 weeks.  Her weight is 300 lb (136.1 kg) today and has had a weight loss of 6 pounds over a period of 2 weeks since her last visit. She has lost 35 lbs since starting treatment with [place].  Vitamin D Deficiency Danielle Shaw has a diagnosis of vitamin D deficiency. She is currently taking prescription Vit D. She notes fatigue and denies nausea, vomiting or muscle weakness.  At risk for osteopenia and osteoporosis Danielle Shaw is at higher risk of osteopenia and osteoporosis due to vitamin D deficiency.   Hypertension Danielle Shaw is a 47 y.o. female with hypertension. Danielle Shaw's blood pressure is controlled. She denies chest pain, chest pressure, or headaches. She is working weight loss to help control her blood pressure with the goal of decreasing her risk of heart attack and stroke.   ALLERGIES: Allergies  Allergen Reactions  . Latex   . Lisinopril Cough  . Metoprolol Other (See Comments)    Funny feeling    MEDICATIONS: Current Outpatient Medications on File Prior to Visit  Medication Sig Dispense Refill  . albuterol (PROVENTIL HFA;VENTOLIN HFA) 108 (90 Base) MCG/ACT inhaler INHALE TWO PUFFS BY MOUTH EVERY 6 HOURS AS NEEDED FOR WHEEZING OR SHORTNESS OF BREATH 18 each 1  . aspirin 81 MG tablet Take 81 mg by mouth daily.     [name] azithromycin (ZITHROMAX) 250 MG tablet Day 1 take 2 pills, days 2-5 take 1 pill daily 6 tablet 0  . cyclobenzaprine (FLEXERIL) 5 MG tablet Take 1 tablet (5 mg total) by mouth 3  (three) times daily as needed for muscle spasms. 30 tablet 1  . HYDROcodone-homatropine (HYCODAN) 5-1.5 MG/5ML syrup Take 5-10 mLs by mouth at bedtime as needed for cough. 50 mL 0  . ipratropium (ATROVENT) 0.03 % nasal spray Place 2 sprays into both nostrils every 12 (twelve) hours. 30 mL 12  . levocetirizine (XYZAL) 5 MG tablet Take 5 mg by mouth every evening.    [name] losartan-hydrochlorothiazide (HYZAAR) 50-12.5 MG tablet Take 1 tablet by mouth daily. 30 tablet 0  . meloxicam (MOBIC) 15 MG tablet Take 1 tablet (15 mg total) by mouth daily as needed for pain. 30 tablet 1  . metFORMIN (GLUCOPHAGE) 500 MG tablet Take 1 tablet (500 mg total) by mouth daily with breakfast. 30 tablet 0  . modafinil (PROVIGIL) 200 MG tablet Take 1 tablet (200 mg total) by mouth daily. 30 tablet 5  . ondansetron (ZOFRAN) 4 MG tablet Take 1 tablet (4 mg total) by mouth every 8 (eight) hours as needed for nausea or vomiting. 50 tablet 0  . pantoprazole (PROTONIX) 40 MG tablet Take 1 tablet (40 mg total) by mouth daily. 90 tablet 3  . rosuvastatin (CRESTOR) 20 MG tablet Take 1 tablet (20 mg total) by mouth daily. 30 tablet 0  . traMADol (ULTRAM) 50 MG tablet Take 1 tablet (50 mg total) by mouth every 8 (eight) hours as needed. 30 tablet 0  No current facility-administered medications on file prior to visit.     PAST MEDICAL HISTORY: Past Medical History:  Diagnosis Date  . Arrhythmia   . Arthritis   . GERD (gastroesophageal reflux disease)   . Hyperlipidemia   . Hypertension   . IUD 2004  . IUD 2009  . Migraine   . SOB (shortness of breath)     PAST SURGICAL HISTORY: Past Surgical History:  Procedure Laterality Date  . BREAST BIOPSY Right 2011  . BREAST SURGERY  2000   reduction  . CESAREAN SECTION      SOCIAL HISTORY: Social History   Tobacco Use  . Smoking status: Never Smoker  . Smokeless tobacco: Never Used  Substance Use Topics  . Alcohol use: No    Alcohol/week: 0.0 standard drinks  . Drug  use: No    FAMILY HISTORY: Family History  Problem Relation Age of Onset  . Heart disease Mother 58  . High Cholesterol Mother   . High blood pressure Mother   . Diabetes Father   . Heart disease Father   . High blood pressure Father   . High Cholesterol Father   . Heart disease Maternal Grandmother   . Hodgkin's lymphoma Daughter     ROS: Review of Systems  Constitutional: Positive for malaise/fatigue and weight loss.  Cardiovascular: Negative for chest pain.       Negative chest pressure  Gastrointestinal: Negative for nausea and vomiting.  Musculoskeletal:       Negative muscle weakness  Neurological: Negative for headaches.    PHYSICAL EXAM: Blood pressure 135/85, pulse 71, temperature 98.1 F (36.7 C), temperature source Oral, height 5\' 5"  (1.651 m), weight 300 lb (136.1 kg), SpO2 98 %. Body mass index is 49.92 kg/m. Physical Exam  Constitutional: She is oriented to person, place, and time. She appears well-developed and well-nourished.  Cardiovascular: Normal rate.  Pulmonary/Chest: Effort normal.  Musculoskeletal: Normal range of motion.  Neurological: She is oriented to person, place, and time.  Skin: Skin is warm and dry.  Psychiatric: She has a normal mood and affect. Her behavior is normal.  Vitals reviewed.   RECENT LABS AND TESTS: BMET    Component Value Date/Time   NA 139 05/19/2018 1136   NA 139 12/08/2017 1201   K 3.7 05/19/2018 1136   CL 102 05/19/2018 1136   CO2 29 05/19/2018 1136   GLUCOSE 88 05/19/2018 1136   BUN 11 05/19/2018 1136   BUN 10 12/08/2017 1201   CREATININE 0.70 05/19/2018 1136   CREATININE 0.64 02/01/2014 1005   CALCIUM 9.4 05/19/2018 1136   GFRNONAA >60 12/14/2017 2303   GFRAA >60 12/14/2017 2303   Lab Results  Component Value Date   HGBA1C 6.0 (H) 03/17/2018   HGBA1C 6.2 (H) 12/08/2017   HGBA1C 6.4 12/24/2016   HGBA1C 6.0 01/21/2016   HGBA1C 6.0 05/03/2015   Lab Results  Component Value Date   INSULIN 16.3  03/17/2018   INSULIN 20.9 12/08/2017   CBC    Component Value Date/Time   WBC 4.7 05/19/2018 1136   RBC 4.43 05/19/2018 1136   HGB 12.9 05/19/2018 1136   HGB 12.9 12/08/2017 1201   HCT 38.2 05/19/2018 1136   HCT 39.5 12/08/2017 1201   PLT 347.0 05/19/2018 1136   MCV 86.1 05/19/2018 1136   MCV 89 12/08/2017 1201   MCH 29.5 12/14/2017 2303   MCHC 33.8 05/19/2018 1136   RDW 14.2 05/19/2018 1136   RDW 15.0 12/08/2017 1201  LYMPHSABS 2.0 12/08/2017 1201   MONOABS 0.3 02/01/2014 1005   EOSABS 0.4 12/08/2017 1201   BASOSABS 0.0 12/08/2017 1201   Iron/TIBC/Ferritin/ %Sat No results found for: IRON, TIBC, FERRITIN, IRONPCTSAT Lipid Panel     Component Value Date/Time   CHOL 131 03/17/2018 1016   TRIG 63 03/17/2018 1016   HDL 38 (L) 03/17/2018 1016   CHOLHDL 7 12/24/2016 1142   VLDL 27.4 12/24/2016 1142   LDLCALC 80 03/17/2018 1016   LDLDIRECT 177 (H) 10/28/2010 0916   Hepatic Function Panel     Component Value Date/Time   PROT 7.9 05/19/2018 1136   PROT 7.4 12/08/2017 1201   ALBUMIN 4.1 05/19/2018 1136   ALBUMIN 4.2 12/08/2017 1201   AST 11 05/19/2018 1136   ALT 12 05/19/2018 1136   ALKPHOS 79 05/19/2018 1136   BILITOT 0.4 05/19/2018 1136   BILITOT 0.4 12/08/2017 1201      Component Value Date/Time   TSH 1.310 12/08/2017 1201   TSH 1.125 02/01/2014 1005   TSH 1.310 05/06/2011 1131  Results for MASHAWN, BRAZIL (MRN [id number]) as of 05/30/2018 18:05  Ref. Range 03/17/2018 10:16  Vitamin D, 25-Hydroxy Latest Ref Range: 30.0 - 100.0 ng/mL 29.3 (L)    ASSESSMENT AND PLAN: Vitamin D deficiency - Plan: Vitamin D, Ergocalciferol, (DRISDOL) 50000 units CAPS capsule  Essential hypertension  At risk for osteoporosis  Class 3 severe obesity with serious comorbidity and body mass index (BMI) of 50.0 to 59.9 in adult, unspecified obesity type ([place])  PLAN:  Vitamin D Deficiency Danielle Shaw was informed that low vitamin D levels contributes to fatigue and are associated with  obesity, breast, and colon cancer. Danielle Shaw agrees to continue taking prescription Vit D @50 ,000 IU every week #4 and we will refill for 1 month. She will follow up for routine testing of vitamin D, at least 2-3 times per year. She was informed of the risk of over-replacement of vitamin D and agrees to not increase her dose unless she discusses this with [place] first. Danielle Shaw agrees to follow up with our clinic in 2 weeks.  At risk for osteopenia and osteoporosis Danielle Shaw was given extended (15 minutes) osteoporosis prevention counseling today. Danielle Shaw is at risk for osteopenia and osteoporsis due to her vitamin D deficiency. She was encouraged to take her vitamin D and follow her higher calcium diet and increase strengthening exercise to help strengthen her bones and decrease her risk of osteopenia and osteoporosis.  Hypertension We discussed sodium restriction, working on healthy weight loss, and a regular exercise program as the means to achieve improved blood pressure control. Danielle Shaw agreed with this plan and agreed to follow up as directed. We will continue to monitor her blood pressure as well as her progress with the above lifestyle modifications. Danielle Shaw agrees to continue her current medications and will watch for signs of hypotension as she continues her lifestyle modifications. Danielle Shaw agrees to follow up with our clinic in 2 weeks.  Obesity Danielle Shaw is currently in the action stage of change. As such, her goal is to continue with weight loss efforts She has agreed to follow the Category 3 plan Danielle Shaw has been instructed to work up to a goal of 150 minutes of combined cardio and strengthening exercise per week for weight loss and overall health benefits. We discussed the following Behavioral Modification Strategies today: increasing lean protein intake, increasing vegetables, work on meal planning and easy cooking plans, and planning for success   Danielle Shaw has agreed to follow up with  our clinic in 2 weeks. She  was informed of the importance of frequent follow up visits to maximize her success with intensive lifestyle modifications for her multiple health conditions.   OBESITY BEHAVIORAL INTERVENTION VISIT  Today's visit was # 12   Starting weight: 335 lbs Starting date: 12/08/17 Today's weight : 300 lbs Today's date: 05/30/2018 Total lbs lost to date: 63    ASK: We discussed the diagnosis of obesity with Danielle Shaw today and Danielle Shaw agreed to give [place] permission to discuss obesity behavioral modification therapy today.  ASSESS: Danielle Shaw has the diagnosis of obesity and her BMI today is 49.92 Danielle Shaw is in the action stage of change   ADVISE: Danielle Shaw was educated on the multiple health risks of obesity as well as the benefit of weight loss to improve her health. She was advised of the need for long term treatment and the importance of lifestyle modifications to improve her current health and to decrease her risk of future health problems.  AGREE: Multiple dietary modification options and treatment options were discussed and  Danielle Shaw agreed to follow the recommendations documented in the above note.  ARRANGE: Danielle Shaw was educated on the importance of frequent visits to treat obesity as outlined per CMS and USPSTF guidelines and agreed to schedule her next follow up appointment today.  I, Trixie Dredge, am acting as transcriptionist for Ilene Qua, MD  I have reviewed the above documentation for accuracy and completeness, and I agree with the above. - Ilene Qua, MD

## 2018-06-05 DIAGNOSIS — G4733 Obstructive sleep apnea (adult) (pediatric): Secondary | ICD-10-CM | POA: Diagnosis not present

## 2018-06-08 ENCOUNTER — Encounter: Payer: Self-pay | Admitting: Nurse Practitioner

## 2018-06-08 ENCOUNTER — Ambulatory Visit (INDEPENDENT_AMBULATORY_CARE_PROVIDER_SITE_OTHER): Payer: BLUE CROSS/BLUE SHIELD | Admitting: Nurse Practitioner

## 2018-06-08 VITALS — BP 157/82 | HR 70 | Wt 308.0 lb

## 2018-06-08 DIAGNOSIS — Z01419 Encounter for gynecological examination (general) (routine) without abnormal findings: Secondary | ICD-10-CM | POA: Diagnosis not present

## 2018-06-08 DIAGNOSIS — Z113 Encounter for screening for infections with a predominantly sexual mode of transmission: Secondary | ICD-10-CM

## 2018-06-08 DIAGNOSIS — I10 Essential (primary) hypertension: Secondary | ICD-10-CM

## 2018-06-08 DIAGNOSIS — Z124 Encounter for screening for malignant neoplasm of cervix: Secondary | ICD-10-CM | POA: Diagnosis not present

## 2018-06-08 DIAGNOSIS — Z1151 Encounter for screening for human papillomavirus (HPV): Secondary | ICD-10-CM | POA: Diagnosis not present

## 2018-06-08 DIAGNOSIS — Z30433 Encounter for removal and reinsertion of intrauterine contraceptive device: Secondary | ICD-10-CM

## 2018-06-08 MED ORDER — LEVONORGESTREL 19.5 MCG/DAY IU IUD: INTRAUTERINE_SYSTEM | Freq: Once | INTRAUTERINE | Status: DC

## 2018-06-08 NOTE — Progress Notes (Addendum)
GYNECOLOGY ANNUAL PREVENTATIVE CARE ENCOUNTER NOTE  Subjective:   Danielle Shaw is a 47 y.o. G23P2002 female here for a routine annual gynecologic exam.  Current complaints: wants IUD removed and replaced as it has expired.  This will be her 4th consecutive IUD.  Replaced last in this office on 06-07-13.  Denies abnormal vaginal bleeding, discharge, pelvic pain, problems with intercourse or other gynecologic concerns.  Has not yet taken her meds today for hypertension.  She has a PCP that she sees regularly.   Gynecologic History No LMP recorded. (Menstrual status: IUD). Has about 3 episodes of spotting every year. Contraception: IUD Last Pap: 05-17-13. Results were: normal Last mammogram: 05-01-16. Results were: normal  Obstetric History OB History  Gravida Para Term Preterm AB Living  2 2 2  0 0 2  SAB TAB Ectopic Multiple Live Births  0 0 0 0      # Outcome Date GA Lbr Len/2nd Weight Sex Delivery Anes PTL Lv  2 Term           1 Term             Past Medical History:  Diagnosis Date  . Arrhythmia   . Arthritis   . GERD (gastroesophageal reflux disease)   . Hyperlipidemia   . Hypertension   . IUD 2004  . IUD 2009  . Migraine   . SOB (shortness of breath)     Past Surgical History:  Procedure Laterality Date  . BREAST BIOPSY Right 2011  . BREAST SURGERY  2000   reduction  . CESAREAN SECTION      Current Outpatient Medications on File Prior to Visit  Medication Sig Dispense Refill  . albuterol (PROVENTIL HFA;VENTOLIN HFA) 108 (90 Base) MCG/ACT inhaler INHALE TWO PUFFS BY MOUTH EVERY 6 HOURS AS NEEDED FOR WHEEZING OR SHORTNESS OF BREATH 18 each 1  . aspirin 81 MG tablet Take 81 mg by mouth daily.     [name] azithromycin (ZITHROMAX) 250 MG tablet Day 1 take 2 pills, days 2-5 take 1 pill daily 6 tablet 0  . cyclobenzaprine (FLEXERIL) 5 MG tablet Take 1 tablet (5 mg total) by mouth 3 (three) times daily as needed for muscle spasms. 30 tablet 1  . HYDROcodone-homatropine  (HYCODAN) 5-1.5 MG/5ML syrup Take 5-10 mLs by mouth at bedtime as needed for cough. 50 mL 0  . ipratropium (ATROVENT) 0.03 % nasal spray Place 2 sprays into both nostrils every 12 (twelve) hours. 30 mL 12  . losartan-hydrochlorothiazide (HYZAAR) 50-12.5 MG tablet Take 1 tablet by mouth daily. 30 tablet 0  . meloxicam (MOBIC) 15 MG tablet Take 1 tablet (15 mg total) by mouth daily as needed for pain. 30 tablet 1  . metFORMIN (GLUCOPHAGE) 500 MG tablet Take 1 tablet (500 mg total) by mouth daily with breakfast. 30 tablet 0  . modafinil (PROVIGIL) 200 MG tablet Take 1 tablet (200 mg total) by mouth daily. 30 tablet 5  . ondansetron (ZOFRAN) 4 MG tablet Take 1 tablet (4 mg total) by mouth every 8 (eight) hours as needed for nausea or vomiting. 50 tablet 0  . pantoprazole (PROTONIX) 40 MG tablet Take 1 tablet (40 mg total) by mouth daily. 90 tablet 3  . rosuvastatin (CRESTOR) 20 MG tablet Take 1 tablet (20 mg total) by mouth daily. 30 tablet 0  . traMADol (ULTRAM) 50 MG tablet Take 1 tablet (50 mg total) by mouth every 8 (eight) hours as needed. 30 tablet 0  . Vitamin  D, Ergocalciferol, (DRISDOL) 50000 units CAPS capsule Take 1 capsule (50,000 Units total) by mouth every 7 (seven) days. 4 capsule 0  . levocetirizine (XYZAL) 5 MG tablet Take 5 mg by mouth every evening.     No current facility-administered medications on file prior to visit.     Allergies  Allergen Reactions  . Latex   . Lisinopril Cough  . Metoprolol Other (See Comments)    Funny feeling    Social History   Socioeconomic History  . Marital status: Significant Other    Spouse name: Not on file  . Number of children: 2  . Years of education: Not on file  . Highest education level: Not on file  Occupational History  . Not on file  Social Needs  . Financial resource strain: Not on file  . Food insecurity:    Worry: Not on file    Inability: Not on file  . Transportation needs:    Medical: Not on file    Non-medical:  Not on file  Tobacco Use  . Smoking status: Never Smoker  . Smokeless tobacco: Never Used  Substance and Sexual Activity  . Alcohol use: No    Alcohol/week: 0.0 standard drinks  . Drug use: No  . Sexual activity: Yes    Partners: Male  Lifestyle  . Physical activity:    Days per week: Not on file    Minutes per session: Not on file  . Stress: Not on file  Relationships  . Social connections:    Talks on phone: Not on file    Gets together: Not on file    Attends religious service: Not on file    Active member of club or organization: Not on file    Attends meetings of clubs or organizations: Not on file    Relationship status: Not on file  . Intimate partner violence:    Fear of current or ex partner: Not on file    Emotionally abused: Not on file    Physically abused: Not on file    Forced sexual activity: Not on file  Other Topics Concern  . Not on file  Social History Narrative  . Not on file    Family History  Problem Relation Age of Onset  . Heart disease Mother 97  . High Cholesterol Mother   . High blood pressure Mother   . Diabetes Father   . Heart disease Father   . High blood pressure Father   . High Cholesterol Father   . Heart disease Maternal Grandmother   . Hodgkin's lymphoma Daughter     The following portions of the patient's history were reviewed and updated as appropriate: allergies, current medications, past family history, past medical history, past social history, past surgical history and problem list.  Review of Systems Pertinent items noted in HPI and remainder of comprehensive ROS otherwise negative.   Objective:  BP (!) 157/82   Pulse 70   Wt (!) 308 lb (139.7 kg)   BMI 51.25 kg/m  CONSTITUTIONAL: Well-developed, well-nourished female in no acute distress.  HENT:  Normocephalic, atraumatic, External right and left ear normal.  EYES: Conjunctivae and EOM are normal. Pupils are equal, round.  No scleral icterus.  NECK: Normal range  of motion, supple, no masses.  Normal thyroid.  SKIN: Skin is warm and dry. No rash noted. Not diaphoretic. No erythema. No pallor. NEUROLOGIC: Alert and oriented to person, place, and time. Normal reflexes, muscle tone coordination. No cranial nerve  deficit noted. PSYCHIATRIC: Normal mood and affect. Normal behavior. Normal judgment and thought content. CARDIOVASCULAR: Normal heart rate noted, regular rhythm RESPIRATORY: Clear to auscultation bilaterally. Effort and breath sounds normal, no problems with respiration noted. BREASTS: Symmetric in size. No masses, skin changes, nipple drainage, or lymphadenopathy. Scars consistent with previous bilateral breast reduction surgery. ABDOMEN: Soft, no distention noted.  No tenderness, rebound or guarding.  PELVIC: Normal appearing external genitalia; normal appearing vaginal mucosa and cervix.  No abnormal discharge noted.  Pap smear obtained.  Bimanual limited due to habitus.  No IUD strings seen.  Cervical os is small. MUSCULOSKELETAL: Normal range of motion. No tenderness.  No cyanosis, clubbing, or edema.    Assessment and Plan:  1. Encounter for gynecological examination  - Cytology - PAP  2.  IUD removal today was not possible due to no strings seen and small cervical os.   Consult with Dr. Harolyn Rutherford - will get [place] to confirm location of IUD and will schedule another appointment for IUD removal and reinsertion with MD.  Scheduled 06-16-18.  - US Transvaginal Non-OB; Future - US Pelvis Complete; Future  3. HYPERTENSION, BENIGN SYSTEMIC Has not take medication today.  Takes it at night.  4. Morbid obesity ([place]) Is being seen by MD and weight has been decreasing.  Will follow up results of pap smear and manage accordingly. Mammogram referral sent to have patient seen. Routine preventative health maintenance measures emphasized. Please refer to After Visit Summary for other counseling recommendations.    Earlie Server, RN, MSN,  NP-BC Nurse Practitioner, [facility] for [facility]   06-16-18 Reviewed ultrasound result from today and CT scan from 11-2017 - IUD was in place at CT scan.  Reviewed the case with Dr. Rip Harbour who will schedule her for IUD removal and replacement with Cytotec 889mcg and Ibuprofen 600 mg PO at bedtime the night before with food and will plan a cervical block and IUD removal as the strings are not visible.  Will plan to replace the IUD.  Note sent to administrative staff to schedule for IUD removal with Dr. Rip Harbour.  Earlie Server, RN, MSN, NP-BC Nurse Practitioner, [facility] for [employer], [facility] 06/16/2018 4:32 PM

## 2018-06-09 ENCOUNTER — Encounter: Payer: Self-pay | Admitting: Gastroenterology

## 2018-06-14 LAB — CYTOLOGY - PAP
CHLAMYDIA, DNA PROBE: NEGATIVE
DIAGNOSIS: NEGATIVE
HPV: NOT DETECTED
NEISSERIA GONORRHEA: NEGATIVE

## 2018-06-15 ENCOUNTER — Ambulatory Visit (INDEPENDENT_AMBULATORY_CARE_PROVIDER_SITE_OTHER): Payer: BLUE CROSS/BLUE SHIELD | Admitting: Family Medicine

## 2018-06-15 VITALS — BP 160/98 | HR 81 | Temp 98.0°F | Ht 65.0 in | Wt 304.0 lb

## 2018-06-15 DIAGNOSIS — Z9189 Other specified personal risk factors, not elsewhere classified: Secondary | ICD-10-CM | POA: Diagnosis not present

## 2018-06-15 DIAGNOSIS — R7303 Prediabetes: Secondary | ICD-10-CM

## 2018-06-15 DIAGNOSIS — I10 Essential (primary) hypertension: Secondary | ICD-10-CM

## 2018-06-15 DIAGNOSIS — E7849 Other hyperlipidemia: Secondary | ICD-10-CM | POA: Diagnosis not present

## 2018-06-15 DIAGNOSIS — Z6841 Body Mass Index (BMI) 40.0 and over, adult: Secondary | ICD-10-CM

## 2018-06-15 MED ORDER — ROSUVASTATIN CALCIUM 20 MG PO TABS: 20.0000 mg | ORAL_TABLET | Freq: Every day | ORAL | 0 refills | Status: DC

## 2018-06-15 MED ORDER — LOSARTAN POTASSIUM-HCTZ 50-12.5 MG PO TABS: 1.0000 | ORAL_TABLET | Freq: Every day | ORAL | 0 refills | Status: DC

## 2018-06-15 MED ORDER — METFORMIN HCL 500 MG PO TABS: 500.0000 mg | ORAL_TABLET | Freq: Every day | ORAL | 0 refills | Status: DC

## 2018-06-16 ENCOUNTER — Ambulatory Visit (HOSPITAL_COMMUNITY)
Admission: RE | Admit: 2018-06-16 | Discharge: 2018-06-16 | Disposition: A | Discharge status: Home / Self Care | Payer: BLUE CROSS/BLUE SHIELD | Source: Ambulatory Visit | Attending: Nurse Practitioner | Admitting: Nurse Practitioner

## 2018-06-16 DIAGNOSIS — Z30431 Encounter for routine checking of intrauterine contraceptive device: Secondary | ICD-10-CM | POA: Insufficient documentation

## 2018-06-16 DIAGNOSIS — D259 Leiomyoma of uterus, unspecified: Secondary | ICD-10-CM | POA: Diagnosis not present

## 2018-06-16 DIAGNOSIS — Z30433 Encounter for removal and reinsertion of intrauterine contraceptive device: Secondary | ICD-10-CM

## 2018-06-16 IMAGING — US US PELVIS COMPLETE TRANSABD/TRANSVAG
1 series · 15 of 25 positions shown · non-contrast
Comparison: CT of the abdomen and pelvis on [DATE]

CLINICAL DATA: Check IUD location.  Spotting on [DATE].



[Series 1: us pelvis complete transabd/transvag · 43 acquisitions, 15 frames shown]
[im 1/43]
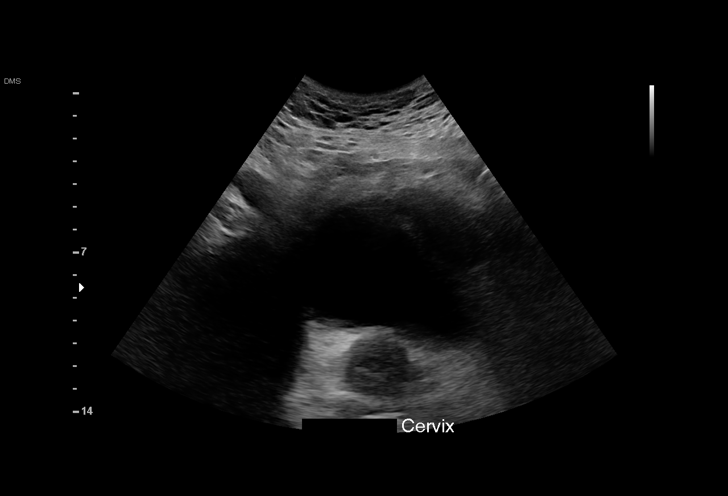
[im 4/43]
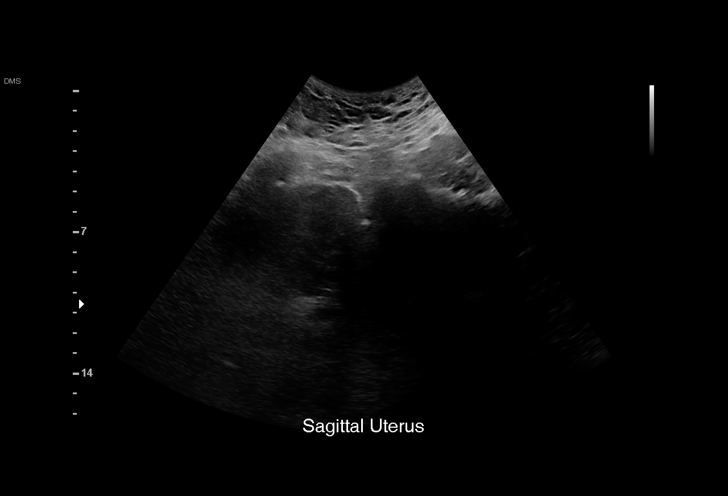
[im 8/43]
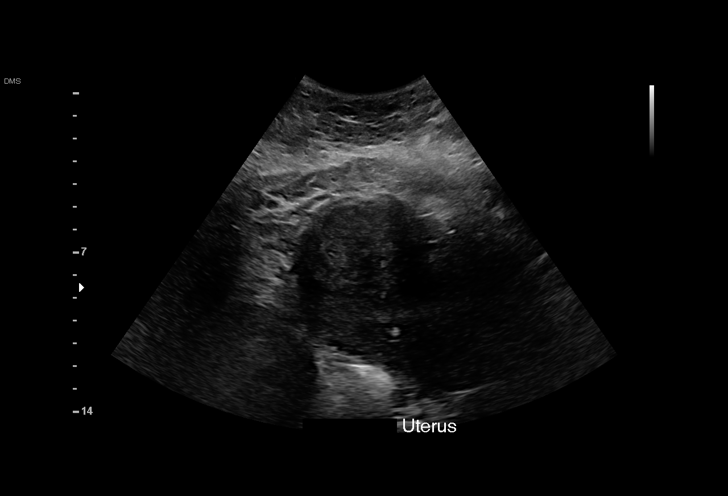
[im 9/43]
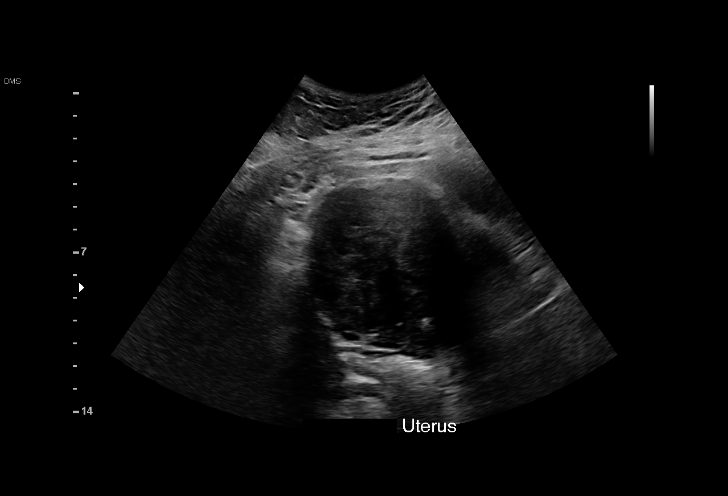
[im 13/43]
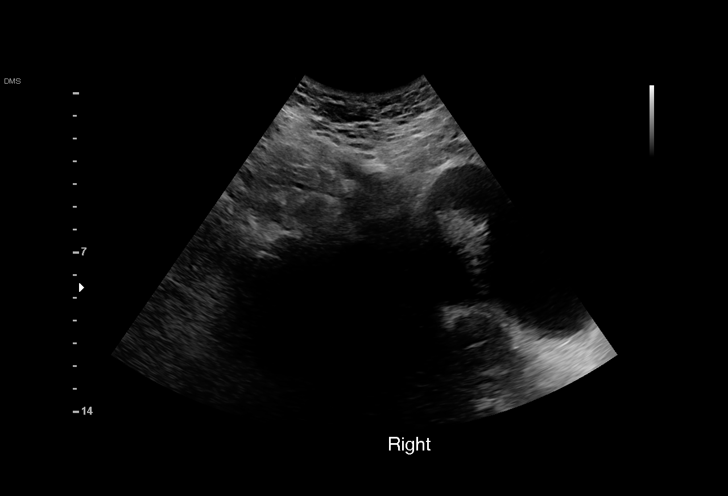
[im 16/43]
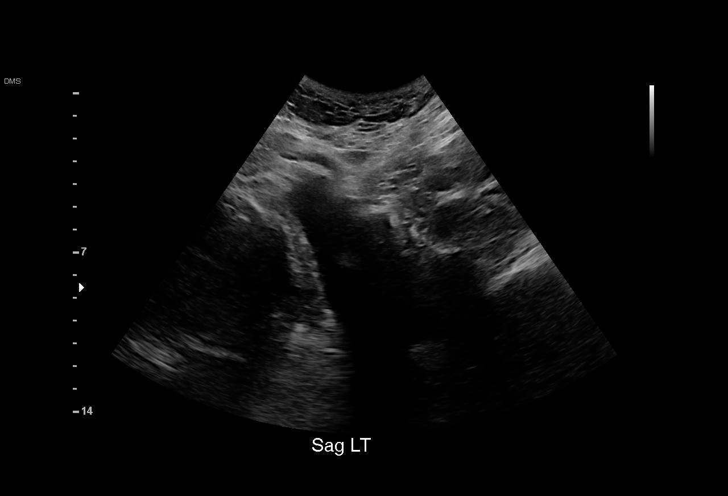
[im 18/43]
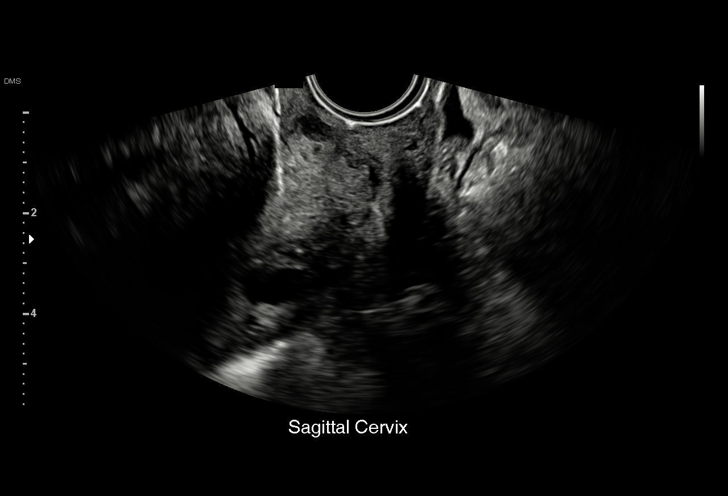
[im 22/43]
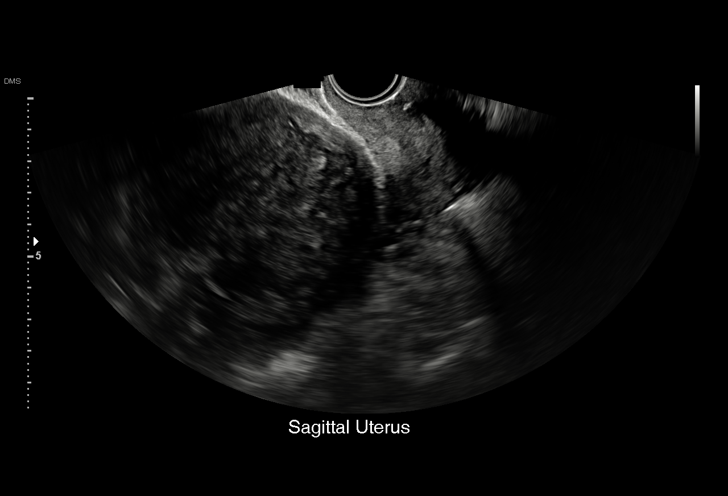
[im 25/43]
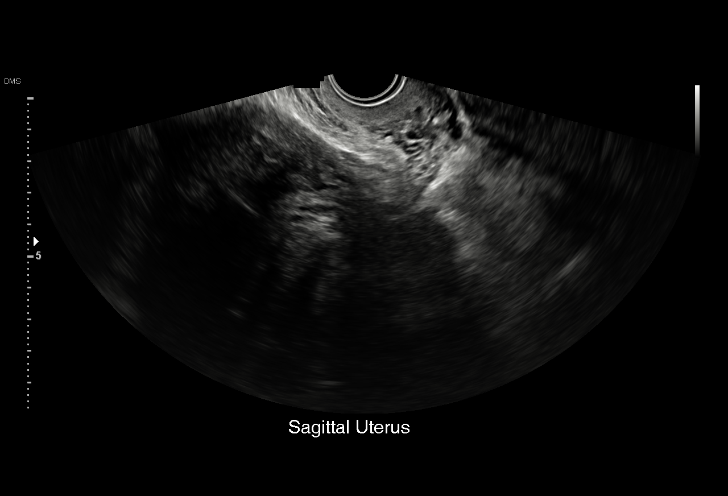
[im 27/43]
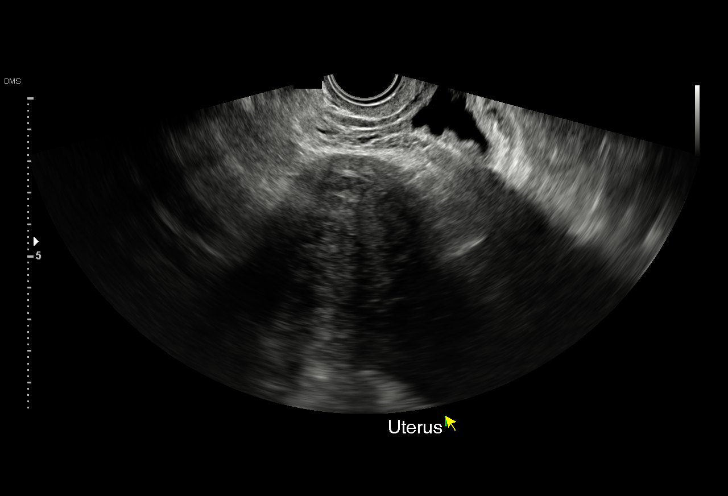
[im 30/43]
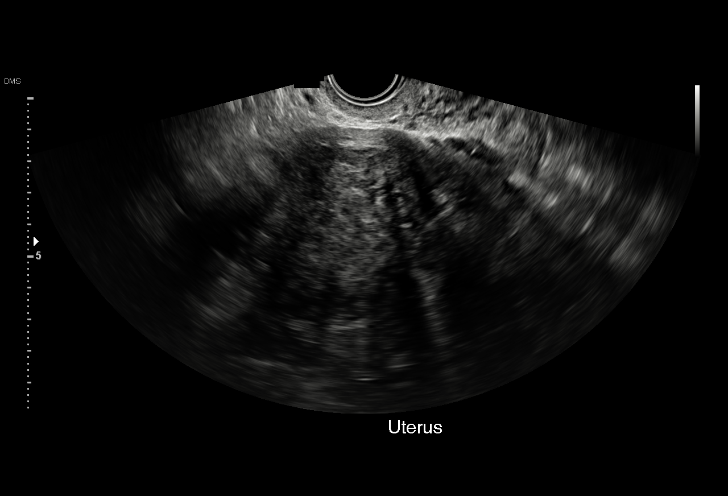
[im 34/43]
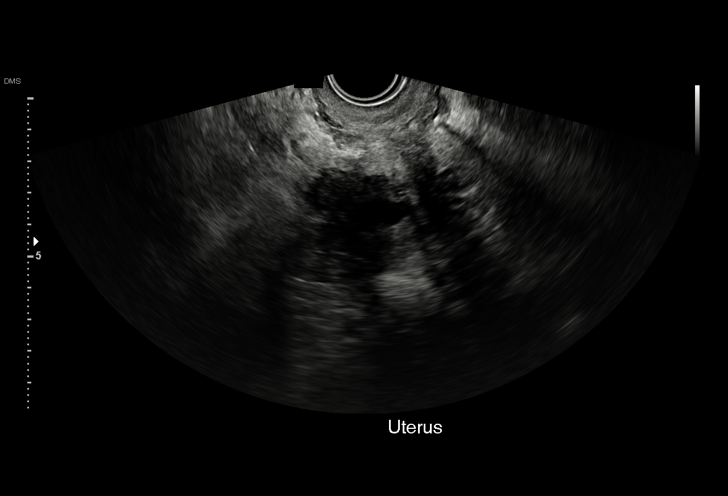
[im 36/43]
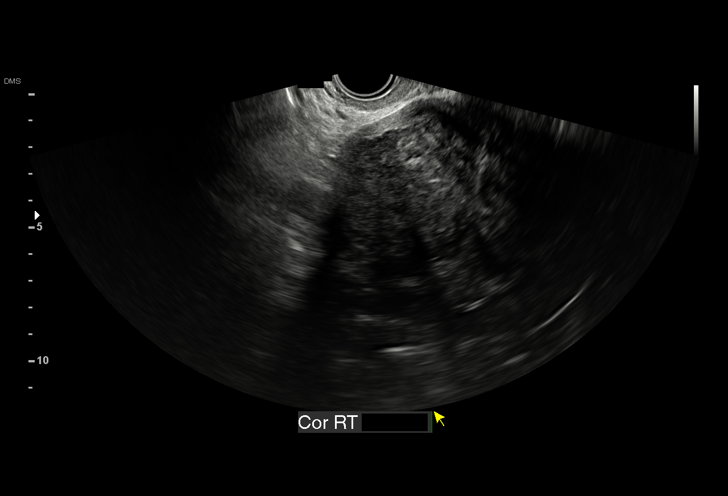
[im 39/43]
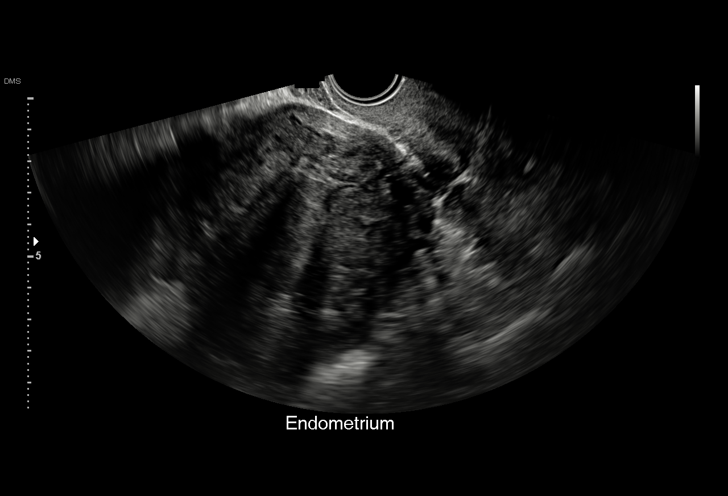
[im 43/43]
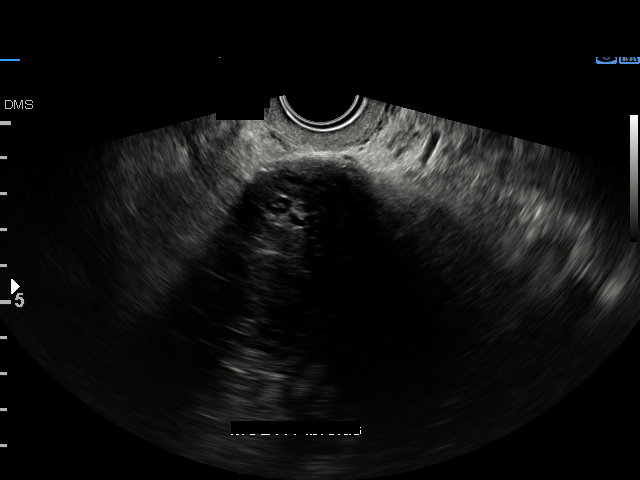

[15 of 25 positions shown; findings below may reference images not displayed]

FINDINGS: Uterus

Measurements: At least 11.6 x 8.2 x 7.8 centimeter = volume:
mL. Uterine wall is heterogeneous with multiple shadowing foci.
Discrete fibroids cannot be measured.

Endometrium

Thickness: Obscured, not measurable. Intrauterine device is not
identified sonographically.

Right ovary

Measurements: The ovary is not visualized, either absent or
obscured. No adnexal mass.

Left ovary

Measurements: The ovary is not visualized, either absent or
obscured. No adnexal mass.

Other findings

No abnormal free fluid. Study quality is degraded by enlarged,
heterogeneous uterus and patient body habitus.
IMPRESSION: 1. Enlarged, heterogeneous uterus consistent with numerous fibroids,
although discrete masses are not seen.
2. The intrauterine device is not identified sonographically but may
well be present. Consider CT of the pelvis as needed to confirm
presence of intrauterine device.
3. Nonvisualized ovaries.

## 2018-06-16 MED ORDER — IBUPROFEN 600 MG PO TABS: 600.0000 mg | ORAL_TABLET | Freq: Four times a day (QID) | ORAL | 1 refills | Status: DC | PRN

## 2018-06-16 MED ORDER — MISOPROSTOL 200 MCG PO TABS: 800.0000 ug | ORAL_TABLET | Freq: Once | ORAL | 0 refills | Status: DC

## 2018-06-16 NOTE — Addendum Note (Signed)
Addended by: Virginia Rochester on: 06/16/2018 04:36 PM   Modules accepted: Orders

## 2018-06-20 NOTE — Progress Notes (Signed)
Office: [phone]  /  Fax: [phone]   HPI:   Chief Complaint: OBESITY Danielle Shaw is here to discuss her progress with her obesity treatment plan. She is following the Category 3 plan and is following her eating plan approximately 95 % of the time. She states she is exercising 0 minutes 0 times per week. Danielle Shaw has noticed an increase in bilateral leg swelling but denies pain. She is feeling a little under the weather after her Gyn appointment last week.   Her weight is (!) 304 lb (137.9 kg) today and has had a weight gain of 5 lbs since her last visit. She has lost 35 lbs since starting treatment with [place].  Hypertension Danielle Shaw is a 47 y.o. female with hypertension.  Danielle Shaw denies chest pain or shortness of breath on exertion. She is working weight loss to help control her blood pressure with the goal of decreasing her risk of heart attack and stroke. Danielle Shaw blood pressure is not currently controlled, she was elevated today. She states that she occasionally has headaches but only in the morning. The headaches improve with Excedrin (not taking everyday).   Pre-Diabetes Danielle Shaw has a diagnosis of prediabetes based on her elevated HgA1c and was informed this puts her at greater risk of developing diabetes. She is taking metformin currently and continues to work on diet and exercise to decrease risk of diabetes. She denies nausea or hypoglycemia. Danielle Shaw said that she has occasional carb cravings but does not indulge. She denies any GI symptoms taking Metformin.  At risk for diabetes Danielle Shaw is at higher than averagerisk for developing diabetes due to her obesity. She currently denies polyuria or polydipsia.  Hyperlipidemia Danielle Shaw has hyperlipidemia and has been trying to improve her cholesterol levels with intensive lifestyle modification including a low saturated fat diet, exercise and weight loss. She denies any chest pain, claudication or myalgias on statin. Last CMP showed normal  liver function test.    ALLERGIES: Allergies  Allergen Reactions  . Latex   . Lisinopril Cough  . Metoprolol Other (See Comments)    Funny feeling    MEDICATIONS: Current Outpatient Medications on File Prior to Visit  Medication Sig Dispense Refill  . albuterol (PROVENTIL HFA;VENTOLIN HFA) 108 (90 Base) MCG/ACT inhaler INHALE TWO PUFFS BY MOUTH EVERY 6 HOURS AS NEEDED FOR WHEEZING OR SHORTNESS OF BREATH 18 each 1  . aspirin 81 MG tablet Take 81 mg by mouth daily.     [name] azithromycin (ZITHROMAX) 250 MG tablet Day 1 take 2 pills, days 2-5 take 1 pill daily 6 tablet 0  . cyclobenzaprine (FLEXERIL) 5 MG tablet Take 1 tablet (5 mg total) by mouth 3 (three) times daily as needed for muscle spasms. 30 tablet 1  . HYDROcodone-homatropine (HYCODAN) 5-1.5 MG/5ML syrup Take 5-10 mLs by mouth at bedtime as needed for cough. 50 mL 0  . ipratropium (ATROVENT) 0.03 % nasal spray Place 2 sprays into both nostrils every 12 (twelve) hours. 30 mL 12  . levocetirizine (XYZAL) 5 MG tablet Take 5 mg by mouth every evening.    . meloxicam (MOBIC) 15 MG tablet Take 1 tablet (15 mg total) by mouth daily as needed for pain. 30 tablet 1  . modafinil (PROVIGIL) 200 MG tablet Take 1 tablet (200 mg total) by mouth daily. 30 tablet 5  . ondansetron (ZOFRAN) 4 MG tablet Take 1 tablet (4 mg total) by mouth every 8 (eight) hours as needed for nausea or vomiting. 50 tablet 0  .  pantoprazole (PROTONIX) 40 MG tablet Take 1 tablet (40 mg total) by mouth daily. 90 tablet 3  . traMADol (ULTRAM) 50 MG tablet Take 1 tablet (50 mg total) by mouth every 8 (eight) hours as needed. 30 tablet 0  . Vitamin D, Ergocalciferol, (DRISDOL) 50000 units CAPS capsule Take 1 capsule (50,000 Units total) by mouth every 7 (seven) days. 4 capsule 0  . ibuprofen (ADVIL,MOTRIN) 600 MG tablet Take 1 tablet (600 mg total) by mouth every 6 (six) hours as needed. 30 tablet 1  . misoprostol (CYTOTEC) 200 MCG tablet Take 4 tablets (800 mcg total) by  mouth once for 1 dose. Take at bedtime the night before the IUD removal is scheduled. 4 tablet 0   No current facility-administered medications on file prior to visit.     PAST MEDICAL HISTORY: Past Medical History:  Diagnosis Date  . Arrhythmia   . Arthritis   . GERD (gastroesophageal reflux disease)   . Hyperlipidemia   . Hypertension   . IUD 2004  . IUD 2009  . Migraine   . SOB (shortness of breath)     PAST SURGICAL HISTORY: Past Surgical History:  Procedure Laterality Date  . BREAST BIOPSY Right 2011  . BREAST SURGERY  2000   reduction  . CESAREAN SECTION      SOCIAL HISTORY: Social History   Tobacco Use  . Smoking status: Never Smoker  . Smokeless tobacco: Never Used  Substance Use Topics  . Alcohol use: No    Alcohol/week: 0.0 standard drinks  . Drug use: No    FAMILY HISTORY: Family History  Problem Relation Age of Onset  . Heart disease Mother 62  . High Cholesterol Mother   . High blood pressure Mother   . Diabetes Father   . Heart disease Father   . High blood pressure Father   . High Cholesterol Father   . Heart disease Maternal Grandmother   . Hodgkin's lymphoma Daughter     ROS: Review of Systems  Constitutional: Negative for weight loss.  Respiratory: Negative for shortness of breath.   Cardiovascular: Negative for chest pain and claudication.  Musculoskeletal: Negative for myalgias.  Neurological: Positive for headaches.  Endo/Heme/Allergies: Negative for polydipsia.       Negative for hypoglycemia Negative fr polyuria     PHYSICAL EXAM: Blood pressure (!) 160/98, pulse 81, temperature 98 F (36.7 C), temperature source Oral, height 5\' 5"  (1.651 m), weight (!) 304 lb (137.9 kg), SpO2 97 %. Body mass index is 50.59 kg/m. Physical Exam  Constitutional: She is oriented to person, place, and time. She appears well-developed and well-nourished.  Cardiovascular: Normal rate.  Pulmonary/Chest: Effort normal.  Musculoskeletal: Normal  range of motion.  Neurological: She is alert and oriented to person, place, and time.  Skin: Skin is warm and dry.  Psychiatric: She has a normal mood and affect. Her behavior is normal.  Vitals reviewed.   RECENT LABS AND TESTS: BMET    Component Value Date/Time   NA 139 05/19/2018 1136   NA 139 12/08/2017 1201   K 3.7 05/19/2018 1136   CL 102 05/19/2018 1136   CO2 29 05/19/2018 1136   GLUCOSE 88 05/19/2018 1136   BUN 11 05/19/2018 1136   BUN 10 12/08/2017 1201   CREATININE 0.70 05/19/2018 1136   CREATININE 0.64 02/01/2014 1005   CALCIUM 9.4 05/19/2018 1136   GFRNONAA >60 12/14/2017 2303   GFRAA >60 12/14/2017 2303   Lab Results  Component Value Date  HGBA1C 6.0 (H) 03/17/2018   HGBA1C 6.2 (H) 12/08/2017   HGBA1C 6.4 12/24/2016   HGBA1C 6.0 01/21/2016   HGBA1C 6.0 05/03/2015   Lab Results  Component Value Date   INSULIN 16.3 03/17/2018   INSULIN 20.9 12/08/2017   CBC    Component Value Date/Time   WBC 4.7 05/19/2018 1136   RBC 4.43 05/19/2018 1136   HGB 12.9 05/19/2018 1136   HGB 12.9 12/08/2017 1201   HCT 38.2 05/19/2018 1136   HCT 39.5 12/08/2017 1201   PLT 347.0 05/19/2018 1136   MCV 86.1 05/19/2018 1136   MCV 89 12/08/2017 1201   MCH 29.5 12/14/2017 2303   MCHC 33.8 05/19/2018 1136   RDW 14.2 05/19/2018 1136   RDW 15.0 12/08/2017 1201   LYMPHSABS 2.0 12/08/2017 1201   MONOABS 0.3 02/01/2014 1005   EOSABS 0.4 12/08/2017 1201   BASOSABS 0.0 12/08/2017 1201   Iron/TIBC/Ferritin/ %Sat No results found for: IRON, TIBC, FERRITIN, IRONPCTSAT Lipid Panel     Component Value Date/Time   CHOL 131 03/17/2018 1016   TRIG 63 03/17/2018 1016   HDL 38 (L) 03/17/2018 1016   CHOLHDL 7 12/24/2016 1142   VLDL 27.4 12/24/2016 1142   LDLCALC 80 03/17/2018 1016   LDLDIRECT 177 (H) 10/28/2010 0916   Hepatic Function Panel     Component Value Date/Time   PROT 7.9 05/19/2018 1136   PROT 7.4 12/08/2017 1201   ALBUMIN 4.1 05/19/2018 1136   ALBUMIN 4.2  12/08/2017 1201   AST 11 05/19/2018 1136   ALT 12 05/19/2018 1136   ALKPHOS 79 05/19/2018 1136   BILITOT 0.4 05/19/2018 1136   BILITOT 0.4 12/08/2017 1201      Component Value Date/Time   TSH 1.310 12/08/2017 1201   TSH 1.125 02/01/2014 1005   TSH 1.310 05/06/2011 1131   Results for AITANNA, HAUBNER (MRN [id number]) as of 06/20/2018 18:39  Ref. Range 12/08/2017 12:01  Vitamin D, 25-Hydroxy Latest Ref Range: 30.0 - 100.0 ng/mL 4.6 (L)    ASSESSMENT AND PLAN: Essential hypertension - Plan: losartan-hydrochlorothiazide (HYZAAR) 50-12.5 MG tablet, rosuvastatin (CRESTOR) 20 MG tablet  Prediabetes - Plan: metFORMIN (GLUCOPHAGE) 500 MG tablet  Other hyperlipidemia  At risk for diabetes mellitus  Class 3 severe obesity with serious comorbidity and body mass index (BMI) of 50.0 to 59.9 in adult, unspecified obesity type ([place])  PLAN: Hypertension We discussed sodium restriction, working on healthy weight loss, and a regular exercise program as the means to achieve improved blood pressure control. Danielle Shaw agreed with this plan and agreed to follow up as directed. We will continue to monitor her blood pressure as well as her progress with the above lifestyle modifications. She will continue taking Losartan/HCTZ 50-12.5mg  qd #30 with no refills and will watch for signs of hypotension as she continues her lifestyle modifications. Shiron agrees to follow up with our office in 2 weeks.   Pre-Diabetes Danielle Shaw will continue to work on weight loss, exercise, and decreasing simple carbohydrates in her diet to help decrease the risk of diabetes. We dicussed metformin including benefits and risks. She was informed that eating too many simple carbohydrates or too many calories at one sitting increases the likelihood of GI side effects. Lezette agrees to continue taking Metformin 500 mg qd #30 with no refills. Danielle Shaw agrees to follow up with our office in 2 weeks.   Diabetes risk counselling Danielle Shaw was given  extended (15 minutes) diabetes prevention counseling today. She is 47 y.o. female and has risk factors  for diabetes including obesity. We discussed intensive lifestyle modifications today with an emphasis on weight loss as well as increasing exercise and decreasing simple carbohydrates in her diet.  Hyperlipidemia Danielle Shaw was informed of the American Heart Association Guidelines emphasizing intensive lifestyle modifications as the first line treatment for hyperlipidemia. We discussed many lifestyle modifications today in depth, and Danielle Shaw will continue to work on decreasing saturated fats such as fatty red meat, butter and many fried foods. She will also increase vegetables and lean protein in her diet and continue to work on exercise and weight loss efforts. She agrees to continue taking Crestor 20 mg qd #30 with no refills. Danielle Shaw agrees to follow up with our office in 2 weeks.   Obesity Danielle Shaw is currently in the action stage of change. As such, her goal is to continue with weight loss efforts She has agreed to follow the Category 3 plan Danielle Shaw has been instructed to work up to a goal of 150 minutes of combined cardio and strengthening exercise per week for weight loss and overall health benefits. We discussed the following Behavioral Modification Strategies today: increasing lean protein intake, meal planning for success, increasing vegetables and work on meal planning and easy cooking plans  Danielle Shaw has agreed to follow up with our clinic in 2 weeks. She was informed of the importance of frequent follow up visits to maximize her success with intensive lifestyle modifications for her multiple health conditions.   OBESITY BEHAVIORAL INTERVENTION VISIT  Today's visit was # 13   Starting weight: 335 lbs Starting date: 12/08/17 Today's weight : Weight: (!) 304 lb (137.9 kg)  Today's date: 06/15/2018 Total lbs lost to date: 35 lbs At least 15 minutes were spent on discussing the following behavioral  intervention visit.   ASK: We discussed the diagnosis of obesity with Danielle Shaw today and Danielle Shaw agreed to give [place] permission to discuss obesity behavioral modification therapy today.  ASSESS: Danielle Shaw has the diagnosis of obesity and her BMI today is 50.59 Asmara is in the action stage of change   ADVISE: Massie was educated on the multiple health risks of obesity as well as the benefit of weight loss to improve her health. She was advised of the need for long term treatment and the importance of lifestyle modifications to improve her current health and to decrease her risk of future health problems.  AGREE: Multiple dietary modification options and treatment options were discussed and  Calista agreed to follow the recommendations documented in the above note.  ARRANGE: Louiza was educated on the importance of frequent visits to treat obesity as outlined per CMS and USPSTF guidelines and agreed to schedule her next follow up appointment today.  Felipa Emory, CMA, am acting as transcriptionist for Ilene Qua, MD  I have reviewed the above documentation for accuracy and completeness, and I agree with the above. - Ilene Qua, MD

## 2018-06-27 ENCOUNTER — Other Ambulatory Visit (INDEPENDENT_AMBULATORY_CARE_PROVIDER_SITE_OTHER): Payer: Self-pay | Admitting: Family Medicine

## 2018-06-27 DIAGNOSIS — I10 Essential (primary) hypertension: Secondary | ICD-10-CM

## 2018-06-28 ENCOUNTER — Encounter: Payer: Self-pay | Admitting: Internal Medicine

## 2018-06-28 ENCOUNTER — Other Ambulatory Visit: Payer: Self-pay

## 2018-06-28 DIAGNOSIS — I10 Essential (primary) hypertension: Secondary | ICD-10-CM

## 2018-06-28 MED ORDER — ROSUVASTATIN CALCIUM 20 MG PO TABS: 20.0000 mg | ORAL_TABLET | Freq: Every day | ORAL | 1 refills | Status: DC

## 2018-07-05 ENCOUNTER — Ambulatory Visit (INDEPENDENT_AMBULATORY_CARE_PROVIDER_SITE_OTHER): Payer: BLUE CROSS/BLUE SHIELD | Admitting: Gastroenterology

## 2018-07-05 ENCOUNTER — Encounter: Payer: Self-pay | Admitting: Gastroenterology

## 2018-07-05 VITALS — BP 150/100 | HR 84 | Ht 65.0 in | Wt 306.0 lb

## 2018-07-05 DIAGNOSIS — G4733 Obstructive sleep apnea (adult) (pediatric): Secondary | ICD-10-CM | POA: Diagnosis not present

## 2018-07-05 DIAGNOSIS — K921 Melena: Secondary | ICD-10-CM

## 2018-07-05 MED ORDER — NA SULFATE-K SULFATE-MG SULF 17.5-3.13-1.6 GM/177ML PO SOLN: 1.0000 | ORAL | 0 refills | Status: AC

## 2018-07-05 NOTE — H&P (View-Only) (Signed)
Referring Provider: Hoyt Koch, * Primary Care Physician:  Hoyt Koch, MD   Reason for Consultation:  Blood in the stool   IMPRESSION:  Painless blood in the stool Chronic constipation - routinely 3-7 days between bowel movements BMI 51 No prior colon cancer screening  The differential for rectal bleeding is broad.  It includes outlet sources such as fissure or hemorrhoids, as well as polyps, mass, ulcers, and colitis.  Given this differential I am recommending a colonoscopy.  PLAN: Colonoscopy at the hospital  I consented the patient at the bedside today discussing the risks, benefits, and alternatives to endoscopic evaluation. In particular, we discussed the risks that include, but are not limited to, reaction to medication, cardiopulmonary compromise, bleeding requiring blood transfusion, aspiration resulting in pneumonia, perforation requiring surgery, and even death. We reviewed the risk of missed lesion including polyps or even cancer. The patient acknowledges these risks and asks that we proceed.   HPI: Danielle Shaw is a 47 y.o. female seen in consultation at the request of Dr. Sharlet Salina for further evaluation of blood in the stool. The history is obtained through the patient and review of her electronic health record. She has hypertension, hyperlipidemia, and obesity   November 2018 she had bright red bleeding while at [facility] with her kids. The bleeding lasted an hour. Recurred 3 days later. She has continued to have bleeding once to twice weekly over the last 3-4 months. Mostly on the toilet paper and in the stool.  Goes 3-7 days between bowel movements. No strain. Soft BM. No urgency or tenesmus. No other associated symptoms. No identified exacerbating or relieving features.   Labs from 05/19/18 show hgb 12.9, MCV 86, RDW 14.   Oldest daughter has IBS and Hodgkin's disease. No known family history of colon cancer or polyps.     Past Medical  History:  Diagnosis Date  . Arrhythmia   . Arthritis   . GERD (gastroesophageal reflux disease)   . Hyperlipidemia   . Hypertension   . IUD 2004  . IUD 2009  . Migraine   . SOB (shortness of breath)     Past Surgical History:  Procedure Laterality Date  . BREAST BIOPSY Right 2011  . BREAST SURGERY  2000   reduction  . CESAREAN SECTION      No current facility-administered medications for this visit.    No current outpatient medications on file.   Facility-Administered Medications Ordered in Other Visits  Medication Dose Route Frequency Provider Last Rate Last Dose  . 0.9 %  sodium chloride infusion   Intravenous Continuous Thornton Park, MD      . lactated ringers infusion   Intravenous Continuous Thornton Park, MD 10 mL/hr at 07/11/18 0721 1,000 mL at 07/11/18 0721    Allergies as of 07/05/2018 - Review Complete 07/05/2018  Allergen Reaction Noted  . Latex    . Lisinopril Cough 10/22/2011  . Metoprolol Other (See Comments) 10/22/2011    Family History  Problem Relation Age of Onset  . Heart disease Mother 42  . High Cholesterol Mother   . High blood pressure Mother   . Diabetes Father   . Heart disease Father   . High blood pressure Father   . High Cholesterol Father   . Heart disease Maternal Grandmother   . Hodgkin's lymphoma Daughter     Social History   Socioeconomic History  . Marital status: Significant Other    Spouse name: Not on file  .  Number of children: 2  . Years of education: Not on file  . Highest education level: Not on file  Occupational History  . Not on file  Social Needs  . Financial resource strain: Not on file  . Food insecurity:    Worry: Not on file    Inability: Not on file  . Transportation needs:    Medical: Not on file    Non-medical: Not on file  Tobacco Use  . Smoking status: Never Smoker  . Smokeless tobacco: Never Used  Substance and Sexual Activity  . Alcohol use: No    Alcohol/week: 0.0 standard  drinks  . Drug use: No  . Sexual activity: Yes    Partners: Male  Lifestyle  . Physical activity:    Days per week: Not on file    Minutes per session: Not on file  . Stress: Not on file  Relationships  . Social connections:    Talks on phone: Not on file    Gets together: Not on file    Attends religious service: Not on file    Active member of club or organization: Not on file    Attends meetings of clubs or organizations: Not on file    Relationship status: Not on file  . Intimate partner violence:    Fear of current or ex partner: Not on file    Emotionally abused: Not on file    Physically abused: Not on file    Forced sexual activity: Not on file  Other Topics Concern  . Not on file  Social History Narrative  . Not on file    Review of Systems: 12 system ROS is negative except as noted above.  Filed Weights   07/05/18 1349  Weight: (!) 306 lb (138.8 kg)    Physical Exam: Vital signs were reviewed. General:   Alert, well-nourished, pleasant and cooperative in NAD Head:  Normocephalic and atraumatic. Eyes:  Sclera clear, no icterus.   Conjunctiva pink. Mouth:  No deformity or lesions.   Neck:  Supple; no thyromegaly. Lungs:  Clear throughout to auscultation.   No wheezes.  Heart:  Regular rate and rhythm; no murmurs Abdomen:  Soft, central obesity, nontender, normal bowel sounds. No rebound or guarding. No hepatosplenomegaly Rectal:  Deferred  Msk:  Symmetrical without gross deformities. Extremities:  No gross deformities or edema. Neurologic:  Alert and  oriented x4;  grossly nonfocal Skin:  No rash or bruise. Psych:  Alert and cooperative. Normal mood and affect.   Eldar Robitaille L. Tarri Glenn, MD, MPH [facility] 07/11/2018, 8:14 AM

## 2018-07-05 NOTE — Patient Instructions (Addendum)

## 2018-07-05 NOTE — Progress Notes (Signed)
Referring Provider: Hoyt Koch, * Primary Care Physician:  Hoyt Koch, MD   Reason for Consultation:  Blood in the stool   IMPRESSION:  Painless blood in the stool Chronic constipation - routinely 3-7 days between bowel movements BMI 51 No prior colon cancer screening  The differential for rectal bleeding is broad.  It includes outlet sources such as fissure or hemorrhoids, as well as polyps, mass, ulcers, and colitis.  Given this differential I am recommending a colonoscopy.  PLAN: Colonoscopy at the hospital  I consented the patient at the bedside today discussing the risks, benefits, and alternatives to endoscopic evaluation. In particular, we discussed the risks that include, but are not limited to, reaction to medication, cardiopulmonary compromise, bleeding requiring blood transfusion, aspiration resulting in pneumonia, perforation requiring surgery, and even death. We reviewed the risk of missed lesion including polyps or even cancer. The patient acknowledges these risks and asks that we proceed.   HPI: Danielle Shaw is a 47 y.o. female seen in consultation at the request of Dr. Sharlet Salina for further evaluation of blood in the stool. The history is obtained through the patient and review of her electronic health record. She has hypertension, hyperlipidemia, and obesity   November 2018 she had bright red bleeding while at [facility] with her kids. The bleeding lasted an hour. Recurred 3 days later. She has continued to have bleeding once to twice weekly over the last 3-4 months. Mostly on the toilet paper and in the stool.  Goes 3-7 days between bowel movements. No strain. Soft BM. No urgency or tenesmus. No other associated symptoms. No identified exacerbating or relieving features.   Labs from 05/19/18 show hgb 12.9, MCV 86, RDW 14.   Oldest daughter has IBS and Hodgkin's disease. No known family history of colon cancer or polyps.     Past Medical  History:  Diagnosis Date  . Arrhythmia   . Arthritis   . GERD (gastroesophageal reflux disease)   . Hyperlipidemia   . Hypertension   . IUD 2004  . IUD 2009  . Migraine   . SOB (shortness of breath)     Past Surgical History:  Procedure Laterality Date  . BREAST BIOPSY Right 2011  . BREAST SURGERY  2000   reduction  . CESAREAN SECTION      No current facility-administered medications for this visit.    No current outpatient medications on file.   Facility-Administered Medications Ordered in Other Visits  Medication Dose Route Frequency Provider Last Rate Last Dose  . 0.9 %  sodium chloride infusion   Intravenous Continuous Thornton Park, MD      . lactated ringers infusion   Intravenous Continuous Thornton Park, MD 10 mL/hr at 07/11/18 0721 1,000 mL at 07/11/18 0721    Allergies as of 07/05/2018 - Review Complete 07/05/2018  Allergen Reaction Noted  . Latex    . Lisinopril Cough 10/22/2011  . Metoprolol Other (See Comments) 10/22/2011    Family History  Problem Relation Age of Onset  . Heart disease Mother 76  . High Cholesterol Mother   . High blood pressure Mother   . Diabetes Father   . Heart disease Father   . High blood pressure Father   . High Cholesterol Father   . Heart disease Maternal Grandmother   . Hodgkin's lymphoma Daughter     Social History   Socioeconomic History  . Marital status: Significant Other    Spouse name: Not on file  .  Number of children: 2  . Years of education: Not on file  . Highest education level: Not on file  Occupational History  . Not on file  Social Needs  . Financial resource strain: Not on file  . Food insecurity:    Worry: Not on file    Inability: Not on file  . Transportation needs:    Medical: Not on file    Non-medical: Not on file  Tobacco Use  . Smoking status: Never Smoker  . Smokeless tobacco: Never Used  Substance and Sexual Activity  . Alcohol use: No    Alcohol/week: 0.0 standard  drinks  . Drug use: No  . Sexual activity: Yes    Partners: Male  Lifestyle  . Physical activity:    Days per week: Not on file    Minutes per session: Not on file  . Stress: Not on file  Relationships  . Social connections:    Talks on phone: Not on file    Gets together: Not on file    Attends religious service: Not on file    Active member of club or organization: Not on file    Attends meetings of clubs or organizations: Not on file    Relationship status: Not on file  . Intimate partner violence:    Fear of current or ex partner: Not on file    Emotionally abused: Not on file    Physically abused: Not on file    Forced sexual activity: Not on file  Other Topics Concern  . Not on file  Social History Narrative  . Not on file    Review of Systems: 12 system ROS is negative except as noted above.  Filed Weights   07/05/18 1349  Weight: (!) 306 lb (138.8 kg)    Physical Exam: Vital signs were reviewed. General:   Alert, well-nourished, pleasant and cooperative in NAD Head:  Normocephalic and atraumatic. Eyes:  Sclera clear, no icterus.   Conjunctiva pink. Mouth:  No deformity or lesions.   Neck:  Supple; no thyromegaly. Lungs:  Clear throughout to auscultation.   No wheezes.  Heart:  Regular rate and rhythm; no murmurs Abdomen:  Soft, central obesity, nontender, normal bowel sounds. No rebound or guarding. No hepatosplenomegaly Rectal:  Deferred  Msk:  Symmetrical without gross deformities. Extremities:  No gross deformities or edema. Neurologic:  Alert and  oriented x4;  grossly nonfocal Skin:  No rash or bruise. Psych:  Alert and cooperative. Normal mood and affect.   Jalan Fariss L. Tarri Glenn, MD, MPH [facility] 07/11/2018, 8:14 AM

## 2018-07-06 ENCOUNTER — Encounter (INDEPENDENT_AMBULATORY_CARE_PROVIDER_SITE_OTHER): Payer: Self-pay

## 2018-07-06 ENCOUNTER — Ambulatory Visit (INDEPENDENT_AMBULATORY_CARE_PROVIDER_SITE_OTHER): Payer: BLUE CROSS/BLUE SHIELD | Admitting: Family Medicine

## 2018-07-07 ENCOUNTER — Ambulatory Visit (INDEPENDENT_AMBULATORY_CARE_PROVIDER_SITE_OTHER): Payer: BLUE CROSS/BLUE SHIELD | Admitting: Obstetrics and Gynecology

## 2018-07-07 ENCOUNTER — Encounter: Payer: Self-pay | Admitting: Obstetrics and Gynecology

## 2018-07-07 VITALS — BP 113/54 | HR 76 | Ht 64.5 in | Wt 304.7 lb

## 2018-07-07 DIAGNOSIS — Z30433 Encounter for removal and reinsertion of intrauterine contraceptive device: Secondary | ICD-10-CM | POA: Diagnosis not present

## 2018-07-07 DIAGNOSIS — Z3040 Encounter for surveillance of contraceptives, unspecified: Secondary | ICD-10-CM

## 2018-07-07 DIAGNOSIS — Z30430 Encounter for insertion of intrauterine contraceptive device: Secondary | ICD-10-CM

## 2018-07-07 LAB — POCT PREGNANCY, URINE: Preg Test, Ur: NEGATIVE

## 2018-07-07 MED ORDER — LEVONORGESTREL 19.5 MCG/DAY IU IUD
INTRAUTERINE_SYSTEM | Freq: Once | INTRAUTERINE | Status: AC
  Administered 2018-07-07: 15:00:00 via INTRAUTERINE

## 2018-07-07 NOTE — Progress Notes (Signed)
    GYNECOLOGY CLINIC PROCEDURE NOTE  Danielle Shaw is a 47 y.o. [phone] here for Mirena IUD removal and reinsertion. No GYN concerns.    IUD Removal and Reinsertion  Patient identified, informed consent performed, consent signed.   Discussed risks of irregular bleeding, cramping, infection, malpositioning or misplacement of the IUD outside the uterus which may require further procedures. Also advised to use backup contraception for one week as the risk of pregnancy is higher during the transition period of removing an IUD and replacing it with another one. Time out was performed. Speculum placed in the vagina. The strings of the IUD were not visualized. IUD hook was inserted and strings were maneuvered to the cervical os. Strings were then easily grasped and removed using ring forceps. The IUD was successfully removed in its entirety. The cervix was cleaned with Betadine x 2 and grasped anteriorly with a single tooth tenaculum.  The new Mirena IUD insertion apparatus was used to sound the uterus to 9 cm;  the IUD was then placed per manufacturer's recommendations. Strings trimmed to 3 cm. Tenaculum was removed, good hemostasis noted. Patient tolerated procedure well.   Patient was given post-procedure instructions.  She was reminded to have backup contraception for one week during this transition period between IUDs.  Patient was also asked to check IUD strings periodically and follow up in 4 weeks for IUD check.   Chancy Milroy MD, [place] Attending [place] for [facility], [place]

## 2018-07-07 NOTE — Patient Instructions (Signed)

## 2018-07-10 NOTE — Anesthesia Preprocedure Evaluation (Addendum)
Anesthesia Evaluation  Patient identified by MRN, date of birth, ID band Patient awake    Reviewed: Allergy & Precautions, NPO status , Patient's Chart, lab work & pertinent test results  History of Anesthesia Complications Negative for: history of anesthetic complications  Airway Mallampati: II  TM Distance: >3 FB Neck ROM: Full    Dental  (+) Dental Advisory Given, Teeth Intact   Pulmonary asthma , sleep apnea ,     + wheezing      Cardiovascular hypertension, Pt. on medications  Rhythm:Regular Rate:Normal   '19 Exercise Stress - 7.6 METS achieved. Blood pressure demonstrated a normal response to exercise. There was no ST segment deviation noted during stress. ETT with fair exercise tolerance (6:26); no chest pain; normal BP response (BP obtained in stage 2 felt to be inaccurate); no ST changes; negative adequate ETT; Duke treadmill score 6.    Neuro/Psych  Headaches, negative psych ROS   GI/Hepatic Neg liver ROS, GERD  Medicated and Controlled,  Endo/Other  Morbid obesity  Renal/GU negative Renal ROS     Musculoskeletal  (+) Arthritis ,   Abdominal (+) + obese,   Peds  Hematology negative hematology ROS (+)   Anesthesia Other Findings   Reproductive/Obstetrics                            Anesthesia Physical Anesthesia Plan  ASA: III  Anesthesia Plan: MAC   Post-op Pain Management:    Induction: Intravenous  PONV Risk Score and Plan: 2 and Propofol infusion and Treatment may vary due to age or medical condition  Airway Management Planned: Nasal Cannula and Natural Airway  Additional Equipment: None  Intra-op Plan:   Post-operative Plan:   Informed Consent: I have reviewed the patients History and Physical, chart, labs and discussed the procedure including the risks, benefits and alternatives for the proposed anesthesia with the patient or authorized representative who  has indicated his/her understanding and acceptance.     Plan Discussed with: CRNA and Anesthesiologist  Anesthesia Plan Comments:        Anesthesia Quick Evaluation

## 2018-07-11 ENCOUNTER — Encounter: Payer: Self-pay | Admitting: Gastroenterology

## 2018-07-11 ENCOUNTER — Ambulatory Visit (HOSPITAL_COMMUNITY)
Admission: RE | Admit: 2018-07-11 | Discharge: 2018-07-11 | Disposition: A | Discharge status: Home / Self Care | Payer: BLUE CROSS/BLUE SHIELD | Attending: Gastroenterology | Admitting: Gastroenterology

## 2018-07-11 ENCOUNTER — Ambulatory Visit (HOSPITAL_COMMUNITY): Payer: BLUE CROSS/BLUE SHIELD | Admitting: Anesthesiology

## 2018-07-11 ENCOUNTER — Other Ambulatory Visit: Payer: Self-pay

## 2018-07-11 ENCOUNTER — Encounter (HOSPITAL_COMMUNITY): Payer: Self-pay | Admitting: *Deleted

## 2018-07-11 ENCOUNTER — Encounter (HOSPITAL_COMMUNITY)
Admission: RE | Disposition: A | Discharge status: Home / Self Care | Payer: Self-pay | Source: Home / Self Care | Attending: Gastroenterology

## 2018-07-11 ENCOUNTER — Encounter (HOSPITAL_COMMUNITY): Payer: Self-pay | Admitting: Gastroenterology

## 2018-07-11 DIAGNOSIS — Z6841 Body Mass Index (BMI) 40.0 and over, adult: Secondary | ICD-10-CM | POA: Insufficient documentation

## 2018-07-11 DIAGNOSIS — Z79899 Other long term (current) drug therapy: Secondary | ICD-10-CM | POA: Insufficient documentation

## 2018-07-11 DIAGNOSIS — K648 Other hemorrhoids: Secondary | ICD-10-CM | POA: Diagnosis not present

## 2018-07-11 DIAGNOSIS — Z888 Allergy status to other drugs, medicaments and biological substances status: Secondary | ICD-10-CM | POA: Insufficient documentation

## 2018-07-11 DIAGNOSIS — K625 Hemorrhage of anus and rectum: Secondary | ICD-10-CM | POA: Diagnosis not present

## 2018-07-11 DIAGNOSIS — M1990 Unspecified osteoarthritis, unspecified site: Secondary | ICD-10-CM | POA: Diagnosis not present

## 2018-07-11 DIAGNOSIS — Z91040 Latex allergy status: Secondary | ICD-10-CM | POA: Insufficient documentation

## 2018-07-11 DIAGNOSIS — Z7982 Long term (current) use of aspirin: Secondary | ICD-10-CM | POA: Diagnosis not present

## 2018-07-11 DIAGNOSIS — K5909 Other constipation: Secondary | ICD-10-CM | POA: Insufficient documentation

## 2018-07-11 DIAGNOSIS — I10 Essential (primary) hypertension: Secondary | ICD-10-CM | POA: Insufficient documentation

## 2018-07-11 DIAGNOSIS — Z8249 Family history of ischemic heart disease and other diseases of the circulatory system: Secondary | ICD-10-CM | POA: Diagnosis not present

## 2018-07-11 DIAGNOSIS — K219 Gastro-esophageal reflux disease without esophagitis: Secondary | ICD-10-CM | POA: Insufficient documentation

## 2018-07-11 DIAGNOSIS — K921 Melena: Secondary | ICD-10-CM | POA: Insufficient documentation

## 2018-07-11 HISTORY — PX: COLONOSCOPY WITH PROPOFOL: SHX5780

## 2018-07-11 LAB — GLUCOSE, CAPILLARY: Glucose-Capillary: 92 mg/dL (ref 70–99)

## 2018-07-11 SURGERY — COLONOSCOPY WITH PROPOFOL
Anesthesia: Monitor Anesthesia Care

## 2018-07-11 MED ORDER — LACTATED RINGERS IV SOLN
INTRAVENOUS | Status: DC
  Administered 2018-07-11: 1000 mL via INTRAVENOUS

## 2018-07-11 MED ORDER — PROPOFOL 10 MG/ML IV BOLUS
INTRAVENOUS | Status: AC
  Filled 2018-07-11: qty 40

## 2018-07-11 MED ORDER — PROPOFOL 10 MG/ML IV BOLUS
INTRAVENOUS | Status: DC | PRN
  Administered 2018-07-11 (×10): 20 mg via INTRAVENOUS
  Administered 2018-07-11: 50 mg via INTRAVENOUS
  Administered 2018-07-11 (×3): 20 mg via INTRAVENOUS

## 2018-07-11 MED ORDER — LIDOCAINE 2% (20 MG/ML) 5 ML SYRINGE
INTRAMUSCULAR | Status: DC | PRN
  Administered 2018-07-11: 60 mg via INTRAVENOUS

## 2018-07-11 MED ORDER — SODIUM CHLORIDE 0.9 % IV SOLN: INTRAVENOUS | Status: DC

## 2018-07-11 SURGICAL SUPPLY — 22 items

## 2018-07-11 NOTE — Discharge Instructions (Signed)
YOU HAD AN ENDOSCOPIC PROCEDURE TODAY: Refer to the procedure report and other information in the discharge instructions given to you for any specific questions about what was found during the examination. If this information does not answer your questions, please call [place] office at [phone] to clarify.  ° °YOU SHOULD EXPECT: Some feelings of bloating in the abdomen. Passage of more gas than usual. Walking can help get rid of the air that was put into your GI tract during the procedure and reduce the bloating. If you had a lower endoscopy (such as a colonoscopy or flexible sigmoidoscopy) you may notice spotting of blood in your stool or on the toilet paper. Some abdominal soreness may be present for a day or two, also. ° °DIET: Your first meal following the procedure should be a light meal and then it is ok to progress to your normal diet. A half-sandwich or bowl of soup is an example of a good first meal. Heavy or fried foods are harder to digest and may make you feel nauseous or bloated. Drink plenty of fluids but you should avoid alcoholic beverages for 24 hours. If you had a esophageal dilation, please see attached instructions for diet.   ° °ACTIVITY: Your care partner should take you home directly after the procedure. You should plan to take it easy, moving slowly for the rest of the day. You can resume normal activity the day after the procedure however YOU SHOULD NOT DRIVE, use power tools, machinery or perform tasks that involve climbing or major physical exertion for 24 hours (because of the sedation medicines used during the test).  ° °SYMPTOMS TO REPORT IMMEDIATELY: °A gastroenterologist can be reached at any hour. Please call [phone]  for any of the following symptoms:  °Following lower endoscopy (colonoscopy, flexible sigmoidoscopy) °Excessive amounts of blood in the stool  °Significant tenderness, worsening of abdominal pains  °Swelling of the abdomen that is new, acute  °Fever of 100° or  higher  °Following upper endoscopy (EGD, EUS, ERCP, esophageal dilation) °Vomiting of blood or coffee ground material  °New, significant abdominal pain  °New, significant chest pain or pain under the shoulder blades  °Painful or persistently difficult swallowing  °New shortness of breath  °Black, tarry-looking or red, bloody stools ° °FOLLOW UP:  °If any biopsies were taken you will be contacted by phone or by letter within the next 1-3 weeks. Call [phone]  if you have not heard about the biopsies in 3 weeks.  °Please also call with any specific questions about appointments or follow up tests. ° °

## 2018-07-11 NOTE — Anesthesia Postprocedure Evaluation (Signed)
Anesthesia Post Note  Patient: Danielle Shaw  Procedure(s) Performed: COLONOSCOPY WITH PROPOFOL (N/A )     Patient location during evaluation: PACU Anesthesia Type: MAC Level of consciousness: awake and alert Pain management: pain level controlled Vital Signs Assessment: post-procedure vital signs reviewed and stable Respiratory status: spontaneous breathing, nonlabored ventilation and respiratory function stable Cardiovascular status: stable and blood pressure returned to baseline Anesthetic complications: no    Last Vitals:  Vitals:   07/11/18 0910 07/11/18 0920  BP: (!) 154/76 (!) 171/99  Pulse: 65 63  Resp: 18 (!) 23  Temp:    SpO2: 99% 100%    Last Pain:  Vitals:   07/11/18 0920  TempSrc:   PainSc: [place]

## 2018-07-11 NOTE — Transfer of Care (Signed)
Immediate Anesthesia Transfer of Care Note  Patient: Danielle Shaw  Procedure(s) Performed: COLONOSCOPY WITH PROPOFOL (N/A )  Patient Location: PACU and Endoscopy Unit  Anesthesia Type:MAC  Level of Consciousness: awake and alert   Airway & Oxygen Therapy: Patient Spontanous Breathing and Patient connected to face mask oxygen  Post-op Assessment: Report given to RN and Post -op Vital signs reviewed and stable  Post vital signs: Reviewed and stable  Last Vitals:  Vitals Value Taken Time  BP    Temp    Pulse    Resp    SpO2      Last Pain:  Vitals:   07/11/18 0702  TempSrc: Oral  PainSc: 0-No pain         Complications: No apparent anesthesia complications

## 2018-07-11 NOTE — Anesthesia Procedure Notes (Signed)
Procedure Name: MAC Date/Time: 07/11/2018 8:27 AM Performed by: Cynda Familia, CRNA Pre-anesthesia Checklist: Patient identified, Emergency Drugs available, Suction available, Patient being monitored and Timeout performed Patient Re-evaluated:Patient Re-evaluated prior to induction Oxygen Delivery Method: Simple face mask Placement Confirmation: positive ETCO2 and breath sounds checked- equal and bilateral Dental Injury: Teeth and Oropharynx as per pre-operative assessment

## 2018-07-11 NOTE — Op Note (Signed)
[facility] Patient Name: Danielle Shaw Procedure Date: 07/11/2018 MRN: [id number] Attending MD: Thornton Park MD, MD Date of Birth: October 17, 1970 CSN: [id number] Age: 47 Admit Type: Outpatient Procedure:                Colonoscopy Indications:              Rectal bleeding. Chronic constipation. No known                            family history of colon cancer or polyps. Providers:                Thornton Park MD, MD, Cleda Daub, RN, Cherylynn Ridges, Technician, Glenis Smoker, CRNA Referring MD:              Medicines:                See the Anesthesia note for documentation of the                            administered medications Complications:            No immediate complications. Estimated Blood Loss:     Estimated blood loss: none. Procedure:                Pre-Anesthesia Assessment:                           - Prior to the procedure, a History and Physical                            was performed, and patient medications and                            allergies were reviewed. The patient's tolerance of                            previous anesthesia was also reviewed. The risks                            and benefits of the procedure and the sedation                            options and risks were discussed with the patient.                            All questions were answered, and informed consent                            was obtained. Prior Anticoagulants: The patient has                            taken no previous anticoagulant or antiplatelet  agents. ASA Grade Assessment: III - A patient with                            severe systemic disease. After reviewing the risks                            and benefits, the patient was deemed in                            satisfactory condition to undergo the procedure.                           After obtaining informed consent, the colonoscope                       was passed under direct vision. Throughout the                            procedure, the patient's blood pressure, pulse, and                            oxygen saturations were monitored continuously. The                            CF-HQ190L ([id number]) Olympus adult colonoscope was                            introduced through the anus and advanced to the the                            terminal ileum, with identification of the                            appendiceal orifice and IC valve. The colonoscopy                            was performed without difficulty. The patient                            tolerated the procedure well. The quality of the                            bowel preparation was good. Scope In: 8:32:47 AM Scope Out: 8:51:41 AM Scope Withdrawal Time: 0 hours 10 minutes 55 seconds  Total Procedure Duration: 0 hours 18 minutes 54 seconds  Findings:      Hemorrhoids were found on perianal exam.      Non-bleeding internal hemorrhoids were found during endoscopy. The       hemorrhoids were medium-sized.      The exam was otherwise without abnormality on direct and retroflexion       views. Impression:               - Hemorrhoids found on perianal exam.                           -  Non-bleeding internal hemorrhoids. This is the                            likely cause of rectal bleeding.                           - The examination was otherwise normal on direct                            and retroflexion views.                           - No specimens collected. Moderate Sedation:      OTHER Recommendation:           - Discharge patient to home.                           - Resume regular diet today.                           - Continue present medications.                           - ProctoFoam-HC: Apply externally BID for 2 weeks.                           - Continue with routine colon cancer screening.                            Currentl suggest that  you have another colonoscopy                            in 10 years, or earlier with the development of any                            new symptoms. Procedure Code(s):        --- Professional ---                           [phone], Colonoscopy, flexible; diagnostic, including                            collection of specimen(s) by brushing or washing,                            when performed (separate procedure) Diagnosis Code(s):        --- Professional ---                           K64.8, Other hemorrhoids                           K62.5, Hemorrhage of anus and rectum CPT copyright 2018 American Medical Association. All rights reserved. The codes documented in this report are preliminary and upon coder review may  be revised to meet current compliance requirements. Thornton Park MD, MD  07/11/2018 9:05:55 AM This report has been signed electronically. Number of Addenda: 0

## 2018-07-11 NOTE — Interval H&P Note (Signed)
History and Physical Interval Note:  07/11/2018 8:16 AM  Danielle Shaw  has presented today for surgery, with the diagnosis of rectal bleeding, BMI > 50. The various methods of treatment have been discussed with the patient and family. After consideration of risks, benefits and other options for treatment, the patient has consented to  Procedure(s): COLONOSCOPY WITH PROPOFOL (N/A) as a surgical intervention .  The patient's history has been reviewed, patient examined, no change in status, stable for surgery.  I have reviewed the patient's chart and labs.  Questions were answered to the patient's satisfaction.     Thornton Park

## 2018-07-13 ENCOUNTER — Encounter (HOSPITAL_COMMUNITY): Payer: Self-pay | Admitting: Gastroenterology

## 2018-07-14 ENCOUNTER — Ambulatory Visit (INDEPENDENT_AMBULATORY_CARE_PROVIDER_SITE_OTHER): Payer: BLUE CROSS/BLUE SHIELD | Admitting: Family Medicine

## 2018-07-14 ENCOUNTER — Encounter (INDEPENDENT_AMBULATORY_CARE_PROVIDER_SITE_OTHER): Payer: Self-pay | Admitting: Family Medicine

## 2018-07-14 VITALS — BP 148/94 | HR 74 | Temp 97.7°F | Ht 65.0 in | Wt 302.0 lb

## 2018-07-14 DIAGNOSIS — Z9189 Other specified personal risk factors, not elsewhere classified: Secondary | ICD-10-CM | POA: Diagnosis not present

## 2018-07-14 DIAGNOSIS — I10 Essential (primary) hypertension: Secondary | ICD-10-CM | POA: Diagnosis not present

## 2018-07-14 DIAGNOSIS — Z6841 Body Mass Index (BMI) 40.0 and over, adult: Secondary | ICD-10-CM

## 2018-07-14 DIAGNOSIS — E559 Vitamin D deficiency, unspecified: Secondary | ICD-10-CM

## 2018-07-14 MED ORDER — VITAMIN D (ERGOCALCIFEROL) 1.25 MG (50000 UNIT) PO CAPS: 50000.0000 [IU] | ORAL_CAPSULE | ORAL | 0 refills | Status: DC

## 2018-07-14 NOTE — Progress Notes (Signed)
Office: [phone]  /  Fax: [phone]   HPI:   Chief Complaint: OBESITY Danielle Shaw is here to discuss her progress with her obesity treatment plan. She is on the Category 3 plan and is following her eating plan approximately 80 % of the time. She states she is walking for 30-45 minutes 5 times per week. Danielle Shaw saw the Gastroenterologist and had a colonoscopy which showed hemorrhoids. She has plans for Christmas celebration on December 29th. She did have decreased appetite after her colonoscopy and has had decreased appetite.  Her weight is (!) 302 lb (137 kg) today and has had a weight loss of 2 pounds over a period of 4 weeks since her last visit. She has lost 33 lbs since starting treatment with [place].  Vitamin D Deficiency Danielle Shaw has a diagnosis of vitamin D deficiency. She is currently taking prescription Vit D. She notes fatigue and denies nausea, vomiting or muscle weakness.  At risk for osteopenia and osteoporosis Danielle Shaw is at higher risk of osteopenia and osteoporosis due to vitamin D deficiency.   Hypertension Danielle Shaw is a 47 y.o. female with hypertension. Danielle Shaw's blood pressure is slightly elevated. She denies chest pain, chest pressure, or headaches. She is working weight loss to help control her blood pressure with the goal of decreasing her risk of heart attack and stroke. Danielle Shaw's blood pressure is not currently controlled.  ASSESSMENT AND PLAN:  Vitamin D deficiency - Plan: Vitamin D, Ergocalciferol, (DRISDOL) 1.25 MG (50000 UT) CAPS capsule  Essential hypertension  At risk for osteoporosis  Class 3 severe obesity with serious comorbidity and body mass index (BMI) of 50.0 to 59.9 in adult, unspecified obesity type ([place])  PLAN:  Vitamin D Deficiency Danielle Shaw was informed that low vitamin D levels contributes to fatigue and are associated with obesity, breast, and colon cancer. Danielle Shaw agrees to continue taking prescription Vit D _0 ,000 IU every week #4 and we will  refill for 1 month. She will follow up for routine testing of vitamin D, at least 2-3 times per year. She was informed of the risk of over-replacement of vitamin D and agrees to not increase her dose unless she discusses this with [place] first. Danielle Shaw agrees to follow up with our clinic in 2 weeks.  At risk for osteopenia and osteoporosis Danielle Shaw was given extended (15 minutes) osteoporosis prevention counseling today. Danielle Shaw is at risk for osteopenia and osteoporsis due to her vitamin D deficiency. She was encouraged to take her vitamin D and follow her higher calcium diet and increase strengthening exercise to help strengthen her bones and decrease her risk of osteopenia and osteoporosis.  Hypertension We discussed sodium restriction, working on healthy weight loss, and a regular exercise program as the means to achieve improved blood pressure control. Danielle Shaw agreed with this plan and agreed to follow up as directed. We will continue to monitor her blood pressure as well as her progress with the above lifestyle modifications. Danielle Shaw agrees to continue her current medications and we will follow up on blood pressure at next appointment. She will watch for signs of hypotension as she continues her lifestyle modifications. Danielle Shaw agrees to follow up with our clinic in 2 weeks.  Obesity Danielle Shaw is currently in the action stage of change. As such, her goal is to continue with weight loss efforts She has agreed to follow the Category 3 plan Danielle Shaw has been instructed to work up to a goal of 150 minutes of combined cardio and strengthening exercise per week for  weight loss and overall health benefits. We discussed the following Behavioral Modification Strategies today: increasing lean protein intake, increasing vegetables, work on meal planning and easy cooking plans, holiday eating strategies, travel eating strategies, and planning for success   Danielle Shaw has agreed to follow up with our clinic in 2 weeks. She was  informed of the importance of frequent follow up visits to maximize her success with intensive lifestyle modifications for her multiple health conditions.  ALLERGIES: Allergies  Allergen Reactions  . Latex   . Lisinopril Cough  . Metoprolol Other (See Comments)    Funny feeling    MEDICATIONS: Current Outpatient Medications on File Prior to Visit  Medication Sig Dispense Refill  . albuterol (PROVENTIL HFA;VENTOLIN HFA) 108 (90 Base) MCG/ACT inhaler INHALE TWO PUFFS BY MOUTH EVERY 6 HOURS AS NEEDED FOR WHEEZING OR SHORTNESS OF BREATH (Patient taking differently: Inhale 2 puffs into the lungs every 6 (six) hours as needed for wheezing or shortness of breath. INHALE TWO PUFFS BY MOUTH EVERY 6 HOURS AS NEEDED FOR WHEEZING OR SHORTNESS OF BREATH) 18 each 1  . aspirin 81 MG tablet Take 81 mg by mouth daily.     . aspirin-acetaminophen-caffeine (EXCEDRIN MIGRAINE) 250-250-65 MG tablet Take 1 tablet by mouth every 6 (six) hours as needed for headache.    . cyclobenzaprine (FLEXERIL) 5 MG tablet Take 1 tablet (5 mg total) by mouth 3 (three) times daily as needed for muscle spasms. 30 tablet 1  . hydrochlorothiazide (HYDRODIURIL) 12.5 MG tablet Take 12.5 mg by mouth daily.  1  . HYDROcodone-homatropine (HYCODAN) 5-1.5 MG/5ML syrup Take 5-10 mLs by mouth at bedtime as needed for cough. 50 mL 0  . ibuprofen (ADVIL,MOTRIN) 600 MG tablet Take 1 tablet (600 mg total) by mouth every 6 (six) hours as needed. (Patient taking differently: Take 600 mg by mouth every 6 (six) hours as needed (pain). ) 30 tablet 1  . ipratropium (ATROVENT) 0.03 % nasal spray Place 2 sprays into both nostrils every 12 (twelve) hours. (Patient taking differently: Place 2 sprays into both nostrils daily. ) 30 mL 12  . levocetirizine (XYZAL) 5 MG tablet Take 5 mg by mouth every evening.    . losartan (COZAAR) 50 MG tablet Take 50 mg by mouth daily.  1  . meloxicam (MOBIC) 15 MG tablet Take 1 tablet (15 mg total) by mouth daily as  needed for pain. 30 tablet 1  . metFORMIN (GLUCOPHAGE) 500 MG tablet Take 1 tablet (500 mg total) by mouth daily with breakfast. 30 tablet 0  . misoprostol (CYTOTEC) 200 MCG tablet Take 4 tablets (800 mcg total) by mouth once for 1 dose. Take at bedtime the night before the IUD removal is scheduled. 4 tablet 0  . modafinil (PROVIGIL) 200 MG tablet Take 1 tablet (200 mg total) by mouth daily. (Patient taking differently: Take 200 mg by mouth daily with lunch. ) 30 tablet 5  . Na Sulfate-K Sulfate-Mg Sulf 17.5-3.13-1.6 GM/177ML SOLN Take 1 kit by mouth as directed. 354 mL 0  . ondansetron (ZOFRAN) 4 MG tablet Take 1 tablet (4 mg total) by mouth every 8 (eight) hours as needed for nausea or vomiting. 50 tablet 0  . pantoprazole (PROTONIX) 40 MG tablet Take 1 tablet (40 mg total) by mouth daily. 90 tablet 3  . rosuvastatin (CRESTOR) 20 MG tablet Take 1 tablet (20 mg total) by mouth daily. 90 tablet 1  . traMADol (ULTRAM) 50 MG tablet Take 1 tablet (50 mg total) by mouth every   8 (eight) hours as needed. (Patient taking differently: Take 50 mg by mouth every 8 (eight) hours as needed (pain). ) 30 tablet 0   No current facility-administered medications on file prior to visit.     PAST MEDICAL HISTORY: Past Medical History:  Diagnosis Date  . Arrhythmia   . Arthritis   . GERD (gastroesophageal reflux disease)   . Hyperlipidemia   . Hypertension   . IUD 2004  . IUD 2009  . Migraine   . SOB (shortness of breath)     PAST SURGICAL HISTORY: Past Surgical History:  Procedure Laterality Date  . BREAST BIOPSY Right 2011  . BREAST SURGERY  2000   reduction  . CESAREAN SECTION    . COLONOSCOPY WITH PROPOFOL N/A 07/11/2018   Procedure: COLONOSCOPY WITH PROPOFOL;  Surgeon: Thornton Park, MD;  Location: [facility];  Service: Gastroenterology;  Laterality: N/A;    SOCIAL HISTORY: Social History   Tobacco Use  . Smoking status: Never Smoker  . Smokeless tobacco: Never Used  Substance Use  Topics  . Alcohol use: No    Alcohol/week: 0.0 standard drinks  . Drug use: No    FAMILY HISTORY: Family History  Problem Relation Age of Onset  . Heart disease Mother 53  . High Cholesterol Mother   . High blood pressure Mother   . Diabetes Father   . Heart disease Father   . High blood pressure Father   . High Cholesterol Father   . Heart disease Maternal Grandmother   . Hodgkin's lymphoma Daughter     ROS: Review of Systems  Constitutional: Positive for malaise/fatigue and weight loss.  Cardiovascular: Negative for chest pain.       Negative chest pressure  Gastrointestinal: Negative for nausea and vomiting.  Musculoskeletal:       Negative muscle weakness  Neurological: Negative for headaches.    PHYSICAL EXAM: Blood pressure (!) 148/94, pulse 74, temperature 97.7 F (36.5 C), temperature source Oral, height 5' 5" (1.651 m), weight (!) 302 lb (137 kg), SpO2 93 %. Body mass index is 50.26 kg/m. Physical Exam Vitals signs reviewed.  Constitutional:      Appearance: Normal appearance. She is obese.  Cardiovascular:     Rate and Rhythm: Normal rate.     Pulses: Normal pulses.  Pulmonary:     Effort: Pulmonary effort is normal.  Musculoskeletal: Normal range of motion.  Skin:    General: Skin is warm and dry.  Neurological:     Mental Status: She is alert and oriented to person, place, and time.  Psychiatric:        Mood and Affect: Mood normal.        Behavior: Behavior normal.     RECENT LABS AND TESTS: BMET    Component Value Date/Time   NA 139 05/19/2018 1136   NA 139 12/08/2017 1201   K 3.7 05/19/2018 1136   CL 102 05/19/2018 1136   CO2 29 05/19/2018 1136   GLUCOSE 88 05/19/2018 1136   BUN 11 05/19/2018 1136   BUN 10 12/08/2017 1201   CREATININE 0.70 05/19/2018 1136   CREATININE 0.64 02/01/2014 1005   CALCIUM 9.4 05/19/2018 1136   GFRNONAA >60 12/14/2017 2303   GFRAA >60 12/14/2017 2303   Lab Results  Component Value Date   HGBA1C 6.0  (H) 03/17/2018   HGBA1C 6.2 (H) 12/08/2017   HGBA1C 6.4 12/24/2016   HGBA1C 6.0 01/21/2016   HGBA1C 6.0 05/03/2015   Lab Results  Component Value  Date   INSULIN 16.3 03/17/2018   INSULIN 20.9 12/08/2017   CBC    Component Value Date/Time   WBC 4.7 05/19/2018 1136   RBC 4.43 05/19/2018 1136   HGB 12.9 05/19/2018 1136   HGB 12.9 12/08/2017 1201   HCT 38.2 05/19/2018 1136   HCT 39.5 12/08/2017 1201   PLT 347.0 05/19/2018 1136   MCV 86.1 05/19/2018 1136   MCV 89 12/08/2017 1201   MCH 29.5 12/14/2017 2303   MCHC 33.8 05/19/2018 1136   RDW 14.2 05/19/2018 1136   RDW 15.0 12/08/2017 1201   LYMPHSABS 2.0 12/08/2017 1201   MONOABS 0.3 02/01/2014 1005   EOSABS 0.4 12/08/2017 1201   BASOSABS 0.0 12/08/2017 1201   Iron/TIBC/Ferritin/ %Sat No results found for: IRON, TIBC, FERRITIN, IRONPCTSAT Lipid Panel     Component Value Date/Time   CHOL 131 03/17/2018 1016   TRIG 63 03/17/2018 1016   HDL 38 (L) 03/17/2018 1016   CHOLHDL 7 12/24/2016 1142   VLDL 27.4 12/24/2016 1142   LDLCALC 80 03/17/2018 1016   LDLDIRECT 177 (H) 10/28/2010 0916   Hepatic Function Panel     Component Value Date/Time   PROT 7.9 05/19/2018 1136   PROT 7.4 12/08/2017 1201   ALBUMIN 4.1 05/19/2018 1136   ALBUMIN 4.2 12/08/2017 1201   AST 11 05/19/2018 1136   ALT 12 05/19/2018 1136   ALKPHOS 79 05/19/2018 1136   BILITOT 0.4 05/19/2018 1136   BILITOT 0.4 12/08/2017 1201      Component Value Date/Time   TSH 1.310 12/08/2017 1201   TSH 1.125 02/01/2014 1005   TSH 1.310 05/06/2011 1131      OBESITY BEHAVIORAL INTERVENTION VISIT  Today's visit was # 14   Starting weight: 335 lbs Starting date: 12/08/17 Today's weight : 302 lbs Today's date: 07/14/2018 Total lbs lost to date: 33    ASK: We discussed the diagnosis of obesity with Danielle Shaw today and Danielle Shaw agreed to give us permission to discuss obesity behavioral modification therapy today.  ASSESS: Danielle Shaw has the diagnosis of  obesity and her BMI today is 50.26 Danielle Shaw is in the action stage of change   ADVISE: Danielle Shaw was educated on the multiple health risks of obesity as well as the benefit of weight loss to improve her health. She was advised of the need for long term treatment and the importance of lifestyle modifications to improve her current health and to decrease her risk of future health problems.  AGREE: Multiple dietary modification options and treatment options were discussed and  Danielle Shaw agreed to follow the recommendations documented in the above note.  ARRANGE: Danielle Shaw was educated on the importance of frequent visits to treat obesity as outlined per CMS and USPSTF guidelines and agreed to schedule her next follow up appointment today.  I, Sharon Martin, am acting as transcriptionist for Alexandria Kadolph, MD  I have reviewed the above documentation for accuracy and completeness, and I agree with the above. - Alexandria Kadolph, MD  

## 2018-08-01 ENCOUNTER — Ambulatory Visit (INDEPENDENT_AMBULATORY_CARE_PROVIDER_SITE_OTHER): Payer: BLUE CROSS/BLUE SHIELD | Admitting: Family Medicine

## 2018-08-05 DIAGNOSIS — G4733 Obstructive sleep apnea (adult) (pediatric): Secondary | ICD-10-CM | POA: Diagnosis not present

## 2018-08-06 ENCOUNTER — Other Ambulatory Visit (INDEPENDENT_AMBULATORY_CARE_PROVIDER_SITE_OTHER): Payer: Self-pay | Admitting: Family Medicine

## 2018-08-06 DIAGNOSIS — R7303 Prediabetes: Secondary | ICD-10-CM

## 2018-08-08 ENCOUNTER — Encounter (INDEPENDENT_AMBULATORY_CARE_PROVIDER_SITE_OTHER): Payer: Self-pay | Admitting: Bariatrics

## 2018-08-08 ENCOUNTER — Ambulatory Visit (INDEPENDENT_AMBULATORY_CARE_PROVIDER_SITE_OTHER): Payer: BLUE CROSS/BLUE SHIELD | Admitting: Bariatrics

## 2018-08-08 VITALS — BP 144/91 | HR 75 | Temp 98.0°F | Ht 65.0 in | Wt 304.0 lb

## 2018-08-08 DIAGNOSIS — Z6841 Body Mass Index (BMI) 40.0 and over, adult: Secondary | ICD-10-CM

## 2018-08-08 DIAGNOSIS — R7303 Prediabetes: Secondary | ICD-10-CM

## 2018-08-08 DIAGNOSIS — Z9189 Other specified personal risk factors, not elsewhere classified: Secondary | ICD-10-CM | POA: Diagnosis not present

## 2018-08-08 DIAGNOSIS — I10 Essential (primary) hypertension: Secondary | ICD-10-CM

## 2018-08-08 MED ORDER — METFORMIN HCL 500 MG PO TABS: 500.0000 mg | ORAL_TABLET | Freq: Every day | ORAL | 0 refills | Status: DC

## 2018-08-09 ENCOUNTER — Ambulatory Visit (INDEPENDENT_AMBULATORY_CARE_PROVIDER_SITE_OTHER): Payer: BLUE CROSS/BLUE SHIELD | Admitting: Internal Medicine

## 2018-08-09 VITALS — BP 160/100 | HR 64 | Temp 98.0°F | Ht 65.0 in | Wt 310.3 lb

## 2018-08-09 DIAGNOSIS — E785 Hyperlipidemia, unspecified: Secondary | ICD-10-CM

## 2018-08-09 DIAGNOSIS — G4733 Obstructive sleep apnea (adult) (pediatric): Secondary | ICD-10-CM

## 2018-08-09 DIAGNOSIS — Z9189 Other specified personal risk factors, not elsewhere classified: Secondary | ICD-10-CM

## 2018-08-09 DIAGNOSIS — I10 Essential (primary) hypertension: Secondary | ICD-10-CM

## 2018-08-09 MED ORDER — HYDROCHLOROTHIAZIDE 25 MG PO TABS: 25.0000 mg | ORAL_TABLET | Freq: Every day | ORAL | 3 refills | Status: DC

## 2018-08-09 NOTE — Progress Notes (Signed)
Office: [phone]  /  Fax: [phone]   HPI:   Chief Complaint: OBESITY Danielle Shaw is here to discuss her progress with her obesity treatment plan. She is on the Category 3 plan and is following her eating plan approximately 90 % of the time. She states she is walking 30 to 45 minutes 5 times per week. Danielle Shaw is doing well overall. She is changing birth control and thinks that this Shaw be contributing to her lack of weight loss. Her weight is (!) 304 lb (137.9 kg) today and has had a weight gain of 2 pounds over a period of 3 to 4 weeks since her last visit. She has lost 31 lbs since starting treatment with [place].  Pre-Diabetes Danielle Shaw has a diagnosis of prediabetes based on her elevated Hgb A1c and was informed this puts her at greater risk of developing diabetes. Her last Hgb A1c was at 6.0 and last insulin level was at 16.3 She is taking metformin currently and continues to work on diet and exercise to decrease risk of diabetes. Her hunger is appropriate and she denies hypoglycemia.  At risk for diabetes Danielle Shaw is at higher than average risk for developing diabetes due to her obesity and prediabetes. She currently denies polyuria or polydipsia.  Hypertension Danielle Shaw is a 48 y.o. female with hypertension. He is taking HCTZ and Cozaar. Danielle Shaw denies chest pain or shortness of breath on exertion. She is working weight loss to help control her blood pressure with the goal of decreasing her risk of heart attack and stroke. Danielle Shaw blood pressure is not well controlled.  ASSESSMENT AND PLAN:  Prediabetes - Plan: metFORMIN (GLUCOPHAGE) 500 MG tablet  Essential hypertension  At risk for diabetes mellitus  Class 3 severe obesity with serious comorbidity and body mass index (BMI) of 50.0 to 59.9 in adult, unspecified obesity type ([place])  PLAN:  Pre-Diabetes Danielle Shaw will continue to work on weight loss, exercise, and decreasing simple carbohydrates in her diet to help decrease the  risk of diabetes. We dicussed metformin including benefits and risks. She was informed that eating too many simple carbohydrates or too many calories at one sitting increases the likelihood of GI side effects. Truly agreed to continue  metformin for now and a prescription was written today for 1 month refill. Danielle Shaw agreed to follow up with [place] as directed to monitor her progress.  Diabetes risk counseling Danielle Shaw was given extended (15 minutes) diabetes prevention counseling today. She is 48 y.o. female and has risk factors for diabetes including obesity and prediabetes. We discussed intensive lifestyle modifications today with an emphasis on weight loss as well as increasing exercise and decreasing simple carbohydrates in her diet.  Hypertension We discussed sodium restriction, working on healthy weight loss, and a regular exercise program as the means to achieve improved blood pressure control. Danielle Shaw agreed with this plan and agreed to follow up as directed. We will continue to monitor her blood pressure as well as her progress with the above lifestyle modifications. She will continue her medications as prescribed and will watch for signs of hypotension as she continues her lifestyle modifications.  Obesity Danielle Shaw is currently in the action stage of change. As such, her goal is to continue with weight loss efforts She has agreed to follow the Category 3 plan Danielle Shaw has been instructed to work up to a goal of 150 minutes of combined cardio and strengthening exercise per week for weight loss and overall health benefits. We discussed the  following Behavioral Modification Strategies today: increase H2O intake, keeping healthy foods in the home, increasing lean protein intake, decreasing simple carbohydrates, increasing vegetables and work on meal planning and easy cooking plans Danielle Shaw will split up her protein. Handouts for homemade seasonings and store bought seasonings were provided to patient  today.  Danielle Shaw has agreed to follow up with our clinic in 2 weeks fasting. She was informed of the importance of frequent follow up visits to maximize her success with intensive lifestyle modifications for her multiple health conditions.  ALLERGIES: Allergies  Allergen Reactions  . Latex   . Lisinopril Cough  . Metoprolol Other (See Comments)    Funny feeling    MEDICATIONS: Current Outpatient Medications on File Prior to Visit  Medication Sig Dispense Refill  . albuterol (PROVENTIL HFA;VENTOLIN HFA) 108 (90 Base) MCG/ACT inhaler INHALE TWO PUFFS BY MOUTH EVERY 6 HOURS AS NEEDED FOR WHEEZING OR SHORTNESS OF BREATH (Patient taking differently: Inhale 2 puffs into the lungs every 6 (six) hours as needed for wheezing or shortness of breath. INHALE TWO PUFFS BY MOUTH EVERY 6 HOURS AS NEEDED FOR WHEEZING OR SHORTNESS OF BREATH) 18 each 1  . aspirin 81 MG tablet Take 81 mg by mouth daily.     [name] aspirin-acetaminophen-caffeine (EXCEDRIN MIGRAINE) 250-250-65 MG tablet Take 1 tablet by mouth every 6 (six) hours as needed for headache.    . cyclobenzaprine (FLEXERIL) 5 MG tablet Take 1 tablet (5 mg total) by mouth 3 (three) times daily as needed for muscle spasms. 30 tablet 1  . HYDROcodone-homatropine (HYCODAN) 5-1.5 MG/5ML syrup Take 5-10 mLs by mouth at bedtime as needed for cough. 50 mL 0  . ibuprofen (ADVIL,MOTRIN) 600 MG tablet Take 1 tablet (600 mg total) by mouth every 6 (six) hours as needed. (Patient taking differently: Take 600 mg by mouth every 6 (six) hours as needed (pain). ) 30 tablet 1  . ipratropium (ATROVENT) 0.03 % nasal spray Place 2 sprays into both nostrils every 12 (twelve) hours. (Patient taking differently: Place 2 sprays into both nostrils daily. ) 30 mL 12  . levocetirizine (XYZAL) 5 MG tablet Take 5 mg by mouth every evening.    [name] losartan (COZAAR) 50 MG tablet Take 50 mg by mouth daily.  1  . meloxicam (MOBIC) 15 MG tablet Take 1 tablet (15 mg total) by mouth daily as needed  for pain. 30 tablet 1  . misoprostol (CYTOTEC) 200 MCG tablet Take 4 tablets (800 mcg total) by mouth once for 1 dose. Take at bedtime the night before the IUD removal is scheduled. 4 tablet 0  . modafinil (PROVIGIL) 200 MG tablet Take 1 tablet (200 mg total) by mouth daily. (Patient taking differently: Take 200 mg by mouth daily with lunch. ) 30 tablet 5  . ondansetron (ZOFRAN) 4 MG tablet Take 1 tablet (4 mg total) by mouth every 8 (eight) hours as needed for nausea or vomiting. 50 tablet 0  . pantoprazole (PROTONIX) 40 MG tablet Take 1 tablet (40 mg total) by mouth daily. 90 tablet 3  . rosuvastatin (CRESTOR) 20 MG tablet Take 1 tablet (20 mg total) by mouth daily. 90 tablet 1  . traMADol (ULTRAM) 50 MG tablet Take 1 tablet (50 mg total) by mouth every 8 (eight) hours as needed. (Patient taking differently: Take 50 mg by mouth every 8 (eight) hours as needed (pain). ) 30 tablet 0  . Vitamin D, Ergocalciferol, (DRISDOL) 1.25 MG (50000 UT) CAPS capsule Take 1 capsule (50,000 Units total)  by mouth every 7 (seven) days. 4 capsule 0   No current facility-administered medications on file prior to visit.     PAST MEDICAL HISTORY: Past Medical History:  Diagnosis Date  . Arrhythmia   . Arthritis   . GERD (gastroesophageal reflux disease)   . Hyperlipidemia   . Hypertension   . IUD 2004  . IUD 2009  . Migraine   . SOB (shortness of breath)     PAST SURGICAL HISTORY: Past Surgical History:  Procedure Laterality Date  . BREAST BIOPSY Right 2011  . BREAST SURGERY  2000   reduction  . CESAREAN SECTION    . COLONOSCOPY WITH PROPOFOL N/A 07/11/2018   Procedure: COLONOSCOPY WITH PROPOFOL;  Surgeon: Thornton Park, MD;  Location: [facility];  Service: Gastroenterology;  Laterality: N/A;    SOCIAL HISTORY: Social History   Tobacco Use  . Smoking status: Never Smoker  . Smokeless tobacco: Never Used  Substance Use Topics  . Alcohol use: No    Alcohol/week: 0.0 standard drinks  .  Drug use: No    FAMILY HISTORY: Family History  Problem Relation Age of Onset  . Heart disease Mother 33  . High Cholesterol Mother   . High blood pressure Mother   . Diabetes Father   . Heart disease Father   . High blood pressure Father   . High Cholesterol Father   . Heart disease Maternal Grandmother   . Hodgkin's lymphoma Daughter     ROS: Review of Systems  Constitutional: Negative for weight loss.  Respiratory: Negative for shortness of breath (on exertion).   Cardiovascular: Negative for chest pain.  Gastrointestinal: Negative for diarrhea, nausea and vomiting.  Genitourinary: Negative for frequency.  Endo/Heme/Allergies: Negative for polydipsia.       Negative for hypoglycemia Negative for polyphagia    PHYSICAL EXAM: Blood pressure (!) 144/91, pulse 75, temperature 98 F (36.7 C), temperature source Oral, height 5\' 5"  (1.651 m), weight (!) 304 lb (137.9 kg), SpO2 98 %. Body mass index is 50.59 kg/m. Physical Exam Vitals signs reviewed.  Constitutional:      Appearance: Normal appearance. She is well-developed. She is obese.  Cardiovascular:     Rate and Rhythm: Normal rate.  Pulmonary:     Effort: Pulmonary effort is normal.  Musculoskeletal: Normal range of motion.  Skin:    General: Skin is warm and dry.  Neurological:     Mental Status: She is alert and oriented to person, place, and time.  Psychiatric:        Mood and Affect: Mood normal.        Behavior: Behavior normal.     RECENT LABS AND TESTS: BMET    Component Value Date/Time   NA 139 05/19/2018 1136   NA 139 12/08/2017 1201   K 3.7 05/19/2018 1136   CL 102 05/19/2018 1136   CO2 29 05/19/2018 1136   GLUCOSE 88 05/19/2018 1136   BUN 11 05/19/2018 1136   BUN 10 12/08/2017 1201   CREATININE 0.70 05/19/2018 1136   CREATININE 0.64 02/01/2014 1005   CALCIUM 9.4 05/19/2018 1136   GFRNONAA >60 12/14/2017 2303   GFRAA >60 12/14/2017 2303   Lab Results  Component Value Date   HGBA1C  6.0 (H) 03/17/2018   HGBA1C 6.2 (H) 12/08/2017   HGBA1C 6.4 12/24/2016   HGBA1C 6.0 01/21/2016   HGBA1C 6.0 05/03/2015   Lab Results  Component Value Date   INSULIN 16.3 03/17/2018   INSULIN 20.9 12/08/2017  CBC    Component Value Date/Time   WBC 4.7 05/19/2018 1136   RBC 4.43 05/19/2018 1136   HGB 12.9 05/19/2018 1136   HGB 12.9 12/08/2017 1201   HCT 38.2 05/19/2018 1136   HCT 39.5 12/08/2017 1201   PLT 347.0 05/19/2018 1136   MCV 86.1 05/19/2018 1136   MCV 89 12/08/2017 1201   MCH 29.5 12/14/2017 2303   MCHC 33.8 05/19/2018 1136   RDW 14.2 05/19/2018 1136   RDW 15.0 12/08/2017 1201   LYMPHSABS 2.0 12/08/2017 1201   MONOABS 0.3 02/01/2014 1005   EOSABS 0.4 12/08/2017 1201   BASOSABS 0.0 12/08/2017 1201   Iron/TIBC/Ferritin/ %Sat No results found for: IRON, TIBC, FERRITIN, IRONPCTSAT Lipid Panel     Component Value Date/Time   CHOL 131 03/17/2018 1016   TRIG 63 03/17/2018 1016   HDL 38 (L) 03/17/2018 1016   CHOLHDL 7 12/24/2016 1142   VLDL 27.4 12/24/2016 1142   LDLCALC 80 03/17/2018 1016   LDLDIRECT 177 (H) 10/28/2010 0916   Hepatic Function Panel     Component Value Date/Time   PROT 7.9 05/19/2018 1136   PROT 7.4 12/08/2017 1201   ALBUMIN 4.1 05/19/2018 1136   ALBUMIN 4.2 12/08/2017 1201   AST 11 05/19/2018 1136   ALT 12 05/19/2018 1136   ALKPHOS 79 05/19/2018 1136   BILITOT 0.4 05/19/2018 1136   BILITOT 0.4 12/08/2017 1201      Component Value Date/Time   TSH 1.310 12/08/2017 1201   TSH 1.125 02/01/2014 1005   TSH 1.310 05/06/2011 1131     Ref. Range 03/17/2018 10:16  Vitamin D, 25-Hydroxy Latest Ref Range: 30.0 - 100.0 ng/mL 29.3 (L)     OBESITY BEHAVIORAL INTERVENTION VISIT  Today's visit was # 15   Starting weight: 335 lbs Starting date: 12/08/2017 Today's weight : 304 lbs Today's date: 08/08/2018 Total lbs lost to date: 31   ASK: We discussed the diagnosis of obesity with Danielle Shaw today and Danielle Shaw agreed to give [place]  permission to discuss obesity behavioral modification therapy today.  ASSESS: Danielle Shaw has the diagnosis of obesity and her BMI today is 50.59 Danielle Shaw is in the action stage of change   ADVISE: Danielle Shaw was educated on the multiple health risks of obesity as well as the benefit of weight loss to improve her health. She was advised of the need for long term treatment and the importance of lifestyle modifications to improve her current health and to decrease her risk of future health problems.  AGREE: Multiple dietary modification options and treatment options were discussed and  Danielle Shaw agreed to follow the recommendations documented in the above note.  ARRANGE: Danielle Shaw was educated on the importance of frequent visits to treat obesity as outlined per CMS and USPSTF guidelines and agreed to schedule her next follow up appointment today.  Corey Skains, am acting as Location manager for [employer]. Owens Shark, DO  I have reviewed the above documentation for accuracy and completeness, and I agree with the above. -Jearld Lesch, DO

## 2018-08-09 NOTE — Patient Instructions (Signed)
-  It was nice meeting you today!  -Increase HCTZ to 25 mg daily.  -Return to clinic in 6 weeks for blood pressure check.

## 2018-08-09 NOTE — Progress Notes (Signed)
Established Patient Office Visit     CC/Reason for Visit: Establish care, follow-up on chronic medical conditions  HPI: Danielle Shaw is a 48 y.o. female who is coming in today for the above mentioned reasons. Past Medical History is significant for: Morbid obesity followed by Dr. Adair Patter at the [facility].  She has lost 33 pounds since starting the program approximately 6 months ago.  She has obstructive sleep apnea and uses a CPAP machine nightly.  She gets around 7 to 8 hours of sleep every night.  She has hypertension that remains uncontrolled, hyperlipidemia well-controlled on statin, insulin resistance on metformin, not a diabetic with a recent A1c of 6.0.  She also takes Provigil as she has excessive daytime sleepiness despite using CPAP.  She has no acute complaints at today's visit.  Her family history is significant for a mother who is deceased from an MI at age 54.  Her daughter has had Hodgkin's lymphoma and apparently now has a secondary cancer which is underwent biopsy last week, results are pending.  She follows with GYN routinely for cervical cancer screening.  She is due for an annual physical in August 2020.   Past Medical/Surgical History: Past Medical History:  Diagnosis Date  . Arrhythmia   . Arthritis   . GERD (gastroesophageal reflux disease)   . Hyperlipidemia   . Hypertension   . IUD 2004  . IUD 2009  . Migraine   . SOB (shortness of breath)     Past Surgical History:  Procedure Laterality Date  . BREAST BIOPSY Right 2011  . BREAST SURGERY  2000   reduction  . CESAREAN SECTION    . COLONOSCOPY WITH PROPOFOL N/A 07/11/2018   Procedure: COLONOSCOPY WITH PROPOFOL;  Surgeon: Thornton Park, MD;  Location: [facility];  Service: Gastroenterology;  Laterality: N/A;    Social History:  reports that she has never smoked. She has never used smokeless tobacco. She reports that she does not drink alcohol or use  drugs.  Allergies: Allergies  Allergen Reactions  . Latex   . Lisinopril Cough  . Metoprolol Other (See Comments)    Funny feeling    Family History:  Family History  Problem Relation Age of Onset  . Heart disease Mother 40  . High Cholesterol Mother   . High blood pressure Mother   . Diabetes Father   . Heart disease Father   . High blood pressure Father   . High Cholesterol Father   . Heart disease Maternal Grandmother   . Hodgkin's lymphoma Daughter      Current Outpatient Medications:  .  albuterol (PROVENTIL HFA;VENTOLIN HFA) 108 (90 Base) MCG/ACT inhaler, INHALE TWO PUFFS BY MOUTH EVERY 6 HOURS AS NEEDED FOR WHEEZING OR SHORTNESS OF BREATH (Patient taking differently: Inhale 2 puffs into the lungs every 6 (six) hours as needed for wheezing or shortness of breath. INHALE TWO PUFFS BY MOUTH EVERY 6 HOURS AS NEEDED FOR WHEEZING OR SHORTNESS OF BREATH), Disp: 18 each, Rfl: 1 .  aspirin 81 MG tablet, Take 81 mg by mouth daily. , Disp: , Rfl:  .  aspirin-acetaminophen-caffeine (EXCEDRIN MIGRAINE) 250-250-65 MG tablet, Take 1 tablet by mouth every 6 (six) hours as needed for headache., Disp: , Rfl:  .  cyclobenzaprine (FLEXERIL) 5 MG tablet, Take 1 tablet (5 mg total) by mouth 3 (three) times daily as needed for muscle spasms., Disp: 30 tablet, Rfl: 1 .  HYDROcodone-homatropine (HYCODAN) 5-1.5 MG/5ML syrup, Take  5-10 mLs by mouth at bedtime as needed for cough., Disp: 50 mL, Rfl: 0 .  ibuprofen (ADVIL,MOTRIN) 600 MG tablet, Take 1 tablet (600 mg total) by mouth every 6 (six) hours as needed. (Patient taking differently: Take 600 mg by mouth every 6 (six) hours as needed (pain). ), Disp: 30 tablet, Rfl: 1 .  ipratropium (ATROVENT) 0.03 % nasal spray, Place 2 sprays into both nostrils every 12 (twelve) hours. (Patient taking differently: Place 2 sprays into both nostrils daily. ), Disp: 30 mL, Rfl: 12 .  levocetirizine (XYZAL) 5 MG tablet, Take 5 mg by mouth every evening., Disp: ,  Rfl:  .  losartan (COZAAR) 50 MG tablet, Take 50 mg by mouth daily., Disp: , Rfl: 1 .  meloxicam (MOBIC) 15 MG tablet, Take 1 tablet (15 mg total) by mouth daily as needed for pain., Disp: 30 tablet, Rfl: 1 .  metFORMIN (GLUCOPHAGE) 500 MG tablet, Take 1 tablet (500 mg total) by mouth daily with breakfast., Disp: 30 tablet, Rfl: 0 .  misoprostol (CYTOTEC) 200 MCG tablet, Take 4 tablets (800 mcg total) by mouth once for 1 dose. Take at bedtime the night before the IUD removal is scheduled., Disp: 4 tablet, Rfl: 0 .  modafinil (PROVIGIL) 200 MG tablet, Take 1 tablet (200 mg total) by mouth daily. (Patient taking differently: Take 200 mg by mouth daily with lunch. ), Disp: 30 tablet, Rfl: 5 .  ondansetron (ZOFRAN) 4 MG tablet, Take 1 tablet (4 mg total) by mouth every 8 (eight) hours as needed for nausea or vomiting., Disp: 50 tablet, Rfl: 0 .  pantoprazole (PROTONIX) 40 MG tablet, Take 1 tablet (40 mg total) by mouth daily., Disp: 90 tablet, Rfl: 3 .  rosuvastatin (CRESTOR) 20 MG tablet, Take 1 tablet (20 mg total) by mouth daily., Disp: 90 tablet, Rfl: 1 .  traMADol (ULTRAM) 50 MG tablet, Take 1 tablet (50 mg total) by mouth every 8 (eight) hours as needed. (Patient taking differently: Take 50 mg by mouth every 8 (eight) hours as needed (pain). ), Disp: 30 tablet, Rfl: 0 .  Vitamin D, Ergocalciferol, (DRISDOL) 1.25 MG (50000 UT) CAPS capsule, Take 1 capsule (50,000 Units total) by mouth every 7 (seven) days., Disp: 4 capsule, Rfl: 0 .  hydrochlorothiazide (HYDRODIURIL) 25 MG tablet, Take 1 tablet (25 mg total) by mouth daily., Disp: 90 tablet, Rfl: 3  Review of Systems:  Constitutional: Denies fever, chills, diaphoresis, appetite change and fatigue.  HEENT: Denies photophobia, eye pain, redness, hearing loss, ear pain, congestion, sore throat, rhinorrhea, sneezing, mouth sores, trouble swallowing, neck pain, neck stiffness and tinnitus.   Respiratory: Denies SOB, DOE, cough, chest tightness,  and  wheezing.   Cardiovascular: Denies chest pain, palpitations and leg swelling.  Gastrointestinal: Denies nausea, vomiting, abdominal pain, diarrhea, constipation, blood in stool and abdominal distention.  Genitourinary: Denies dysuria, urgency, frequency, hematuria, flank pain and difficulty urinating.  Endocrine: Denies: hot or cold intolerance, sweats, changes in hair or nails, polyuria, polydipsia. Musculoskeletal: Denies myalgias, back pain, joint swelling, arthralgias and gait problem.  Skin: Denies pallor, rash and wound.  Neurological: Denies dizziness, seizures, syncope, weakness, light-headedness, numbness and headaches.  Hematological: Denies adenopathy. Easy bruising, personal or family bleeding history  Psychiatric/Behavioral: Denies suicidal ideation, mood changes, confusion, nervousness, sleep disturbance and agitation    Physical Exam: Vitals:   08/09/18 1459  BP: (!) 160/100  Pulse: 64  Temp: 98 F (36.7 C)  TempSrc: Oral  SpO2: 97%  Weight: (!) 310 lb 4.8  oz (140.8 kg)  Height: 5\' 5"  (1.651 m)    Body mass index is 51.64 kg/m.   Constitutional: NAD, calm, comfortable Eyes: PERRL, lids and conjunctivae normal ENMT: Mucous membranes are moist. Neck: normal, supple, no masses, no thyromegaly Respiratory: clear to auscultation bilaterally, no wheezing, no crackles. Normal respiratory effort. No accessory muscle use.  Cardiovascular: Regular rate and rhythm, no murmurs / rubs / gallops. No extremity edema. 2+ pedal pulses. No carotid bruits.  Abdomen: no tenderness, no masses palpated. No hepatosplenomegaly. Bowel sounds positive.  Musculoskeletal: no clubbing / cyanosis. No joint deformity upper and lower extremities. Good ROM, no contractures. Normal muscle tone.  Skin: no rashes, lesions, ulcers. No induration Neurologic: CN 2-12 grossly intact. Sensation intact, DTR normal. Strength 5/5 in all 4.  Psychiatric: Normal judgment and insight. Alert and oriented x 3.  Normal mood.    Impression and Plan:  HYPERTENSION, BENIGN SYSTEMIC  -Blood pressure remains quite elevated today, 150/92 160/100.  -Will increase hydrochlorothiazide from 12.5 to 25 mg daily. -She will return to clinic in 6 weeks for repeat blood pressure management, can consider basic metabolic profile at that time to check renal function and electrolytes since we are increasing her diuretic dosage today.  At risk for diabetes mellitus  -Most recent A1c is 6.0. -She is on metformin due to her history of insulin resistance as diagnosed by the weight and wellness clinic.  Hyperlipidemia, unspecified hyperlipidemia type -Last LDL was 88 in August 2019, remains on Crestor 20 mg daily.  Morbid obesity ([place]) -BMI is 51.64. -She has lost 33 pounds since she has started going to the healthy weight and wellness clinic. -We have discussed lifestyle modifications at length today.  Obstructive sleep apnea hypopnea, severe -Much better since initiating CPAP, also on Provigil due to excessive daytime sleepiness.    Patient Instructions  -It was nice meeting you today!  -Increase HCTZ to 25 mg daily.  -Return to clinic in 6 weeks for blood pressure check.     Lelon Frohlich, MD [facility]

## 2018-08-11 ENCOUNTER — Ambulatory Visit: Payer: BLUE CROSS/BLUE SHIELD | Admitting: Obstetrics and Gynecology

## 2018-08-29 ENCOUNTER — Ambulatory Visit (INDEPENDENT_AMBULATORY_CARE_PROVIDER_SITE_OTHER): Payer: BLUE CROSS/BLUE SHIELD | Admitting: Family Medicine

## 2018-09-05 DIAGNOSIS — G4733 Obstructive sleep apnea (adult) (pediatric): Secondary | ICD-10-CM | POA: Diagnosis not present

## 2018-09-07 ENCOUNTER — Encounter (INDEPENDENT_AMBULATORY_CARE_PROVIDER_SITE_OTHER): Payer: Self-pay

## 2018-09-07 ENCOUNTER — Ambulatory Visit (INDEPENDENT_AMBULATORY_CARE_PROVIDER_SITE_OTHER): Payer: BLUE CROSS/BLUE SHIELD | Admitting: Family Medicine

## 2018-09-20 ENCOUNTER — Ambulatory Visit (INDEPENDENT_AMBULATORY_CARE_PROVIDER_SITE_OTHER): Payer: BLUE CROSS/BLUE SHIELD | Admitting: Internal Medicine

## 2018-09-20 ENCOUNTER — Encounter: Payer: Self-pay | Admitting: Internal Medicine

## 2018-09-20 VITALS — BP 160/90 | HR 91 | Temp 97.5°F | Wt 313.6 lb

## 2018-09-20 DIAGNOSIS — B9789 Other viral agents as the cause of diseases classified elsewhere: Secondary | ICD-10-CM

## 2018-09-20 DIAGNOSIS — J069 Acute upper respiratory infection, unspecified: Secondary | ICD-10-CM | POA: Diagnosis not present

## 2018-09-20 DIAGNOSIS — I10 Essential (primary) hypertension: Secondary | ICD-10-CM | POA: Diagnosis not present

## 2018-09-20 MED ORDER — HYDROCODONE-HOMATROPINE 5-1.5 MG/5ML PO SYRP: 5.0000 mL | ORAL_SOLUTION | Freq: Three times a day (TID) | ORAL | 0 refills | Status: DC | PRN

## 2018-09-20 MED ORDER — LOSARTAN POTASSIUM 100 MG PO TABS: 100.0000 mg | ORAL_TABLET | Freq: Every day | ORAL | 3 refills | Status: DC

## 2018-09-20 NOTE — Patient Instructions (Addendum)
Great seeing you today.  Instructions: -Increase losartan to 100mg  daily for blood pressure -Hycodan syrup sent for your cough. May use 5 ml at bedtime if needed. -Schedule follow up in 6-8 weeks for BP check and lab draw.

## 2018-09-20 NOTE — Progress Notes (Signed)
Established Patient Office Visit     CC/Reason for Visit: elevated blood pressure  HPI: Danielle Shaw is a 48 y.o. female who is coming in today for the above mentioned reasons. Past Medical History is significant for hypertension, hyperlipidemia and migraines.  Patient is here for elevated blood pressure.  She's been monitoring her bp at home, averageing about 150/85 on an average day.  At last visit pts HCTZ was increased to 25mg  daily.  Patient said she's been "sick" for about a week with what sounds like an upper respiratory infection.  She has been taking several OTC medications for that.  Patient has been taking hycodan at night due to her cont. Cough.  She also wears a Cpap at night to sleep, so she says the cough medicine helps her get a good nights rest as well.    Past Medical/Surgical History: Past Medical History:  Diagnosis Date  . Arrhythmia   . Arthritis   . GERD (gastroesophageal reflux disease)   . Hyperlipidemia   . Hypertension   . IUD 2004  . IUD 2009  . Migraine   . SOB (shortness of breath)     Past Surgical History:  Procedure Laterality Date  . BREAST BIOPSY Right 2011  . BREAST SURGERY  2000   reduction  . CESAREAN SECTION    . COLONOSCOPY WITH PROPOFOL N/A 07/11/2018   Procedure: COLONOSCOPY WITH PROPOFOL;  Surgeon: Thornton Park, MD;  Location: [facility];  Service: Gastroenterology;  Laterality: N/A;    Social History:  reports that she has never smoked. She has never used smokeless tobacco. She reports that she does not drink alcohol or use drugs.  Allergies: Allergies  Allergen Reactions  . Latex   . Lisinopril Cough  . Metoprolol Other (See Comments)    Funny feeling    Family History:  Family History  Problem Relation Age of Onset  . Heart disease Mother 21  . High Cholesterol Mother   . High blood pressure Mother   . Diabetes Father   . Heart disease Father   . High blood pressure Father   . High Cholesterol  Father   . Heart disease Maternal Grandmother   . Hodgkin's lymphoma Daughter      Current Outpatient Medications:  .  albuterol (PROVENTIL HFA;VENTOLIN HFA) 108 (90 Base) MCG/ACT inhaler, INHALE TWO PUFFS BY MOUTH EVERY 6 HOURS AS NEEDED FOR WHEEZING OR SHORTNESS OF BREATH (Patient taking differently: Inhale 2 puffs into the lungs every 6 (six) hours as needed for wheezing or shortness of breath. INHALE TWO PUFFS BY MOUTH EVERY 6 HOURS AS NEEDED FOR WHEEZING OR SHORTNESS OF BREATH), Disp: 18 each, Rfl: 1 .  aspirin 81 MG tablet, Take 81 mg by mouth daily. , Disp: , Rfl:  .  aspirin-acetaminophen-caffeine (EXCEDRIN MIGRAINE) 250-250-65 MG tablet, Take 1 tablet by mouth every 6 (six) hours as needed for headache., Disp: , Rfl:  .  cyclobenzaprine (FLEXERIL) 5 MG tablet, Take 1 tablet (5 mg total) by mouth 3 (three) times daily as needed for muscle spasms., Disp: 30 tablet, Rfl: 1 .  hydrochlorothiazide (HYDRODIURIL) 25 MG tablet, Take 1 tablet (25 mg total) by mouth daily., Disp: 90 tablet, Rfl: 3 .  HYDROcodone-homatropine (HYCODAN) 5-1.5 MG/5ML syrup, Take 5-10 mLs by mouth at bedtime as needed for cough., Disp: 50 mL, Rfl: 0 .  ibuprofen (ADVIL,MOTRIN) 600 MG tablet, Take 1 tablet (600 mg total) by mouth every 6 (six) hours as needed. (Patient taking  differently: Take 600 mg by mouth every 6 (six) hours as needed (pain). ), Disp: 30 tablet, Rfl: 1 .  ipratropium (ATROVENT) 0.03 % nasal spray, Place 2 sprays into both nostrils every 12 (twelve) hours. (Patient taking differently: Place 2 sprays into both nostrils daily. ), Disp: 30 mL, Rfl: 12 .  levocetirizine (XYZAL) 5 MG tablet, Take 5 mg by mouth every evening., Disp: , Rfl:  .  losartan (COZAAR) 50 MG tablet, Take 50 mg by mouth daily., Disp: , Rfl: 1 .  meloxicam (MOBIC) 15 MG tablet, Take 1 tablet (15 mg total) by mouth daily as needed for pain., Disp: 30 tablet, Rfl: 1 .  metFORMIN (GLUCOPHAGE) 500 MG tablet, Take 1 tablet (500 mg total)  by mouth daily with breakfast., Disp: 30 tablet, Rfl: 0 .  misoprostol (CYTOTEC) 200 MCG tablet, Take 4 tablets (800 mcg total) by mouth once for 1 dose. Take at bedtime the night before the IUD removal is scheduled., Disp: 4 tablet, Rfl: 0 .  modafinil (PROVIGIL) 200 MG tablet, Take 1 tablet (200 mg total) by mouth daily. (Patient taking differently: Take 200 mg by mouth daily with lunch. ), Disp: 30 tablet, Rfl: 5 .  ondansetron (ZOFRAN) 4 MG tablet, Take 1 tablet (4 mg total) by mouth every 8 (eight) hours as needed for nausea or vomiting., Disp: 50 tablet, Rfl: 0 .  pantoprazole (PROTONIX) 40 MG tablet, Take 1 tablet (40 mg total) by mouth daily., Disp: 90 tablet, Rfl: 3 .  rosuvastatin (CRESTOR) 20 MG tablet, Take 1 tablet (20 mg total) by mouth daily., Disp: 90 tablet, Rfl: 1 .  traMADol (ULTRAM) 50 MG tablet, Take 1 tablet (50 mg total) by mouth every 8 (eight) hours as needed. (Patient taking differently: Take 50 mg by mouth every 8 (eight) hours as needed (pain). ), Disp: 30 tablet, Rfl: 0 .  Vitamin D, Ergocalciferol, (DRISDOL) 1.25 MG (50000 UT) CAPS capsule, Take 1 capsule (50,000 Units total) by mouth every 7 (seven) days., Disp: 4 capsule, Rfl: 0 .  HYDROcodone-homatropine (HYCODAN) 5-1.5 MG/5ML syrup, Take 5 mLs by mouth every 8 (eight) hours as needed for cough., Disp: 120 mL, Rfl: 0 .  losartan (COZAAR) 100 MG tablet, Take 1 tablet (100 mg total) by mouth daily., Disp: 90 tablet, Rfl: 3  Review of Systems: Constitutional: Denies fever, chills, diaphoresis, appetite change and fatigue.  HEENT: Denies photophobia, eye pain, redness, hearing loss, ear pain, rhinorrhea, sneezing, mouth sores, trouble swallowing, neck pain, neck stiffness and tinnitus.  C/o postnasal drip, increased mucous output, congestion and cough Respiratory: Denies SOB, DOE, chest tightness.  C/o wheezing  Cardiovascular: Denies chest pain, palpitations and leg swelling.  Gastrointestinal: Denies nausea, vomiting,  abdominal pain, diarrhea, constipation, blood in stool and abdominal distention.  Genitourinary: Denies dysuria, urgency, frequency, hematuria, flank pain and difficulty urinating.  Endocrine: Denies: hot or cold intolerance, sweats, changes in hair or nails, polyuria, polydipsia. Musculoskeletal: Denies myalgias, back pain, joint swelling, arthralgias and gait problem.  Skin: Denies pallor, rash and wound.  Neurological: Denies dizziness, seizures, syncope, weakness, light-headedness, numbness and headaches.  Hematological: Denies adenopathy. Easy bruising, personal or family bleeding history  Psychiatric/Behavioral: Denies suicidal ideation, mood changes, confusion, nervousness, sleep disturbance and agitation.  C/o increased stress due to her children being sick   Physical Exam: Vitals:   09/20/18 1544  BP: (!) 160/90  Pulse: 91  Temp: (!) 97.5 F (36.4 C)  TempSrc: Oral  SpO2: 97%  Weight: (!) 313 lb 9.6 oz (  142.2 kg)    Body mass index is 52.19 kg/m.   Constitutional: NAD, calm, comfortable Eyes: PERRL, lids and conjunctivae normal Respiratory: clear to auscultation bilaterally, no wheezing, no crackles. Normal respiratory effort. No accessory muscle use.  Cardiovascular: Regular rate and rhythm, no murmurs / rubs / gallops. No extremity edema.  Psychiatric: Normal judgment and insight. Alert and oriented x 3. Normal mood.    Impression and Plan:  URI with cough -hycodan called into pharmacy -Given exam findings, PNA, pharyngitis, ear infection are not likely, hence abx have not been prescribed. -Have advised rest, fluids, OTC antihistamines, cough suppressants and mucinex. -RTC if no improvement in 10-14 days.  HYPERTENSION, BENIGN SYSTEMIC -increase losartan to 100mg  daily -cont taking HCTZ daily  -cont monitoring bp at home -BMET in 6 weeks to follow renal function and electrolytes due to increase in diuretic and ARB dose.     Patient Instructions  Doristine Devoid  seeing you today.  Instructions: -Increase losartan to 100mg  daily for blood pressure -Hycodan syrup sent for your cough. May use 5 ml at bedtime if needed. -Schedule follow up in 6-8 weeks for BP check and lab draw.     Enzo Bi, RN DNP Student [facility]

## 2018-09-27 ENCOUNTER — Ambulatory Visit (INDEPENDENT_AMBULATORY_CARE_PROVIDER_SITE_OTHER): Payer: BLUE CROSS/BLUE SHIELD | Admitting: Family Medicine

## 2018-09-27 VITALS — BP 138/85 | HR 64 | Temp 97.9°F | Ht 65.0 in | Wt 307.0 lb

## 2018-09-27 DIAGNOSIS — R7303 Prediabetes: Secondary | ICD-10-CM

## 2018-09-27 DIAGNOSIS — Z9189 Other specified personal risk factors, not elsewhere classified: Secondary | ICD-10-CM | POA: Diagnosis not present

## 2018-09-27 DIAGNOSIS — I10 Essential (primary) hypertension: Secondary | ICD-10-CM | POA: Diagnosis not present

## 2018-09-27 DIAGNOSIS — E559 Vitamin D deficiency, unspecified: Secondary | ICD-10-CM

## 2018-09-27 DIAGNOSIS — Z6841 Body Mass Index (BMI) 40.0 and over, adult: Secondary | ICD-10-CM

## 2018-09-27 MED ORDER — METFORMIN HCL 500 MG PO TABS: 500.0000 mg | ORAL_TABLET | Freq: Every day | ORAL | 0 refills | Status: DC

## 2018-09-27 NOTE — Progress Notes (Signed)
Office: [phone]  /  Fax: [phone]   HPI:   Chief Complaint: OBESITY Danielle Shaw is here to discuss her progress with her obesity treatment plan. She is on the Category 3 plan and is following her eating plan approximately 50 % of the time. She states she is walking 7,000-10,000 steps for 30 minutes 3-4 times per week. Jazelyn has been traveling for work, and she was out of town for almost 1 month. She notes some familial stress with daughters cancer recurring and also being pregnant. She is planning to be here for a few weeks. Her birthday is [birth year].  Her weight is (!) 307 lb (139.3 kg) today and has gained 5 pounds since her last visit. She has lost 28 lbs since starting treatment with [place].  Hypertension Danielle Shaw is a 48 y.o. female with hypertension. [name]'s blood pressure is controlled today. She hasnt taken her blood pressure medications today. She denies chest pain, chest pressure, or headaches. She is upset with the change in blood pressure medications. She notes recent change in blood pressure medications by her primary care physician. She is working on weight loss to help control her blood pressure with the goal of decreasing her risk of heart attack and stroke.   Pre-Diabetes Danielle Shaw has a diagnosis of pre-diabetes based on her elevated Hgb  A1c and was informed this puts her at greater risk of developing diabetes. She notes carbohydrate cravings and stress eating, and denies GI side effects of metformin. She continues to work on diet and exercise to decrease risk of diabetes. She denies hypoglycemia.  Vitamin D deficiency [name] has a diagnosis of vitamin D deficiency. She is currently taking prescription Vit D. She notes fatigue and denies nausea, vomiting or muscle weakness.  At risk for osteopenia and osteoporosis Danielle Shaw is at higher risk of osteopenia and osteoporosis due to vitamin D deficiency.   ASSESSMENT AND PLAN:  Essential hypertension - Plan: Lipid Panel With  LDL/HDL Ratio  Prediabetes - Plan: Hemoglobin A1c, Insulin, random, Lipid Panel With LDL/HDL Ratio, metFORMIN (GLUCOPHAGE) 500 MG tablet  Vitamin D deficiency - Plan: VITAMIN D 25 Hydroxy (Vit-D Deficiency, Fractures)  At risk for osteoporosis  Class 3 severe obesity with serious comorbidity and body mass index (BMI) of 50.0 to 59.9 in adult, unspecified obesity type ([place])  PLAN:  Hypertension We discussed sodium restriction, working on healthy weight loss, and a regular exercise program as the means to achieve improved blood pressure control. Danielle Shaw agreed with this plan and agreed to follow up as directed. We will continue to monitor her blood pressure as well as her progress with the above lifestyle modifications. Danielle Shaw is to follow up with Dr. Jerilee Hoh and she was encouraged to continue her current medications at the current doses. She will watch for signs of hypotension as she continues her lifestyle modifications. We will check FLP today. Danielle Shaw agrees to follow up with our clinic in 2 weeks.  Pre-Diabetes Danielle Shaw will continue to work on weight loss, exercise, and decreasing simple carbohydrates in her diet to help decrease the risk of diabetes. We dicussed metformin including benefits and risks. She was informed that eating too many simple carbohydrates or too many calories at one sitting increases the likelihood of GI side effects. Danielle Shaw agrees to continue taking metformin 500 mg q AM #30 and we will refill for 1 month. We will check Hgb A1c and insulin today. Danielle Shaw agrees to follow up with our clinic in 2 weeks as directed to monitor  her progress.  Vitamin D Deficiency Danielle Shaw was informed that low vitamin D levels contributes to fatigue and are associated with obesity, breast, and colon cancer. Danielle Shaw agrees to continue taking prescription Vit D @50 ,000 IU every week and will follow up for routine testing of vitamin D, at least 2-3 times per year. She was informed of the risk of  over-replacement of vitamin D and agrees to not increase her dose unless she discusses this with [place] first. We will check Vit D level today. Danielle Shaw agrees to follow up with our clinic in 2 weeks.  At risk for osteopenia and osteoporosis [name] was given extended (15 minutes) osteoporosis prevention counseling today. Danielle Shaw is at risk for osteopenia and osteoporsis due to her vitamin D deficiency. She was encouraged to take her vitamin D and follow her higher calcium diet and increase strengthening exercise to help strengthen her bones and decrease her risk of osteopenia and osteoporosis.  Obesity Danielle Shaw is currently in the action stage of change. As such, her goal is to continue with weight loss efforts She has agreed to follow the Category 3 plan Danielle Shaw has been instructed to work up to a goal of 150 minutes of combined cardio and strengthening exercise per week for weight loss and overall health benefits. We discussed the following Behavioral Modification Strategies today: increasing lean protein intake, increasing vegetables and work on meal planning and easy cooking plans, and planning for success   Danielle Shaw has agreed to follow up with our clinic in 2 weeks. She was informed of the importance of frequent follow up visits to maximize her success with intensive lifestyle modifications for her multiple health conditions.  ALLERGIES: Allergies  Allergen Reactions  . Latex   . Lisinopril Cough  . Metoprolol Other (See Comments)    Funny feeling    MEDICATIONS: Current Outpatient Medications on File Prior to Visit  Medication Sig Dispense Refill  . albuterol (PROVENTIL HFA;VENTOLIN HFA) 108 (90 Base) MCG/ACT inhaler INHALE TWO PUFFS BY MOUTH EVERY 6 HOURS AS NEEDED FOR WHEEZING OR SHORTNESS OF BREATH (Patient taking differently: Inhale 2 puffs into the lungs every 6 (six) hours as needed for wheezing or shortness of breath. INHALE TWO PUFFS BY MOUTH EVERY 6 HOURS AS NEEDED FOR WHEEZING OR SHORTNESS  OF BREATH) 18 each 1  . aspirin 81 MG tablet Take 81 mg by mouth daily.     [name] aspirin-acetaminophen-caffeine (EXCEDRIN MIGRAINE) 250-250-65 MG tablet Take 1 tablet by mouth every 6 (six) hours as needed for headache.    . cyclobenzaprine (FLEXERIL) 5 MG tablet Take 1 tablet (5 mg total) by mouth 3 (three) times daily as needed for muscle spasms. 30 tablet 1  . hydrochlorothiazide (HYDRODIURIL) 25 MG tablet Take 1 tablet (25 mg total) by mouth daily. 90 tablet 3  . HYDROcodone-homatropine (HYCODAN) 5-1.5 MG/5ML syrup Take 5-10 mLs by mouth at bedtime as needed for cough. 50 mL 0  . HYDROcodone-homatropine (HYCODAN) 5-1.5 MG/5ML syrup Take 5 mLs by mouth every 8 (eight) hours as needed for cough. 120 mL 0  . ibuprofen (ADVIL,MOTRIN) 600 MG tablet Take 1 tablet (600 mg total) by mouth every 6 (six) hours as needed. (Patient taking differently: Take 600 mg by mouth every 6 (six) hours as needed (pain). ) 30 tablet 1  . ipratropium (ATROVENT) 0.03 % nasal spray Place 2 sprays into both nostrils every 12 (twelve) hours. (Patient taking differently: Place 2 sprays into both nostrils daily. ) 30 mL 12  . levocetirizine (XYZAL) 5 MG  tablet Take 5 mg by mouth every evening.    [name] losartan (COZAAR) 100 MG tablet Take 1 tablet (100 mg total) by mouth daily. 90 tablet 3  . meloxicam (MOBIC) 15 MG tablet Take 1 tablet (15 mg total) by mouth daily as needed for pain. 30 tablet 1  . misoprostol (CYTOTEC) 200 MCG tablet Take 4 tablets (800 mcg total) by mouth once for 1 dose. Take at bedtime the night before the IUD removal is scheduled. 4 tablet 0  . modafinil (PROVIGIL) 200 MG tablet Take 1 tablet (200 mg total) by mouth daily. (Patient taking differently: Take 200 mg by mouth daily with lunch. ) 30 tablet 5  . ondansetron (ZOFRAN) 4 MG tablet Take 1 tablet (4 mg total) by mouth every 8 (eight) hours as needed for nausea or vomiting. 50 tablet 0  . pantoprazole (PROTONIX) 40 MG tablet Take 1 tablet (40 mg total) by  mouth daily. 90 tablet 3  . rosuvastatin (CRESTOR) 20 MG tablet Take 1 tablet (20 mg total) by mouth daily. 90 tablet 1  . traMADol (ULTRAM) 50 MG tablet Take 1 tablet (50 mg total) by mouth every 8 (eight) hours as needed. (Patient taking differently: Take 50 mg by mouth every 8 (eight) hours as needed (pain). ) 30 tablet 0  . Vitamin D, Ergocalciferol, (DRISDOL) 1.25 MG (50000 UT) CAPS capsule Take 1 capsule (50,000 Units total) by mouth every 7 (seven) days. 4 capsule 0   No current facility-administered medications on file prior to visit.     PAST MEDICAL HISTORY: Past Medical History:  Diagnosis Date  . Arrhythmia   . Arthritis   . GERD (gastroesophageal reflux disease)   . Hyperlipidemia   . Hypertension   . IUD 2004  . IUD 2009  . Migraine   . SOB (shortness of breath)     PAST SURGICAL HISTORY: Past Surgical History:  Procedure Laterality Date  . BREAST BIOPSY Right 2011  . BREAST SURGERY  2000   reduction  . CESAREAN SECTION    . COLONOSCOPY WITH PROPOFOL N/A 07/11/2018   Procedure: COLONOSCOPY WITH PROPOFOL;  Surgeon: Thornton Park, MD;  Location: [facility];  Service: Gastroenterology;  Laterality: N/A;    SOCIAL HISTORY: Social History   Tobacco Use  . Smoking status: Never Smoker  . Smokeless tobacco: Never Used  Substance Use Topics  . Alcohol use: No    Alcohol/week: 0.0 standard drinks  . Drug use: No    FAMILY HISTORY: Family History  Problem Relation Age of Onset  . Heart disease Mother 62  . High Cholesterol Mother   . High blood pressure Mother   . Diabetes Father   . Heart disease Father   . High blood pressure Father   . High Cholesterol Father   . Heart disease Maternal Grandmother   . Hodgkin's lymphoma Daughter     ROS: Review of Systems  Constitutional: Positive for malaise/fatigue. Negative for weight loss.  Cardiovascular: Negative for chest pain.       Negative chest pressure  Gastrointestinal: Negative for nausea and  vomiting.  Musculoskeletal:       Negative muscle weakness  Neurological: Negative for headaches.  Endo/Heme/Allergies:       Negative hypoglycemia    PHYSICAL EXAM: Blood pressure 138/85, pulse 64, temperature 97.9 F (36.6 C), temperature source Oral, height 5\' 5"  (1.651 m), weight (!) 307 lb (139.3 kg), SpO2 97 %. Body mass index is 51.09 kg/m. Physical Exam Vitals signs reviewed.  Constitutional:      Appearance: Normal appearance. She is obese.  Cardiovascular:     Rate and Rhythm: Normal rate.     Pulses: Normal pulses.  Pulmonary:     Effort: Pulmonary effort is normal.     Breath sounds: Normal breath sounds.  Musculoskeletal: Normal range of motion.  Skin:    General: Skin is warm and dry.  Neurological:     Mental Status: She is alert and oriented to person, place, and time.  Psychiatric:        Mood and Affect: Mood normal.        Behavior: Behavior normal.     RECENT LABS AND TESTS: BMET    Component Value Date/Time   NA 139 05/19/2018 1136   NA 139 12/08/2017 1201   K 3.7 05/19/2018 1136   CL 102 05/19/2018 1136   CO2 29 05/19/2018 1136   GLUCOSE 88 05/19/2018 1136   BUN 11 05/19/2018 1136   BUN 10 12/08/2017 1201   CREATININE 0.70 05/19/2018 1136   CREATININE 0.64 02/01/2014 1005   CALCIUM 9.4 05/19/2018 1136   GFRNONAA >60 12/14/2017 2303   GFRAA >60 12/14/2017 2303   Lab Results  Component Value Date   HGBA1C 6.0 (H) 03/17/2018   HGBA1C 6.2 (H) 12/08/2017   HGBA1C 6.4 12/24/2016   HGBA1C 6.0 01/21/2016   HGBA1C 6.0 05/03/2015   Lab Results  Component Value Date   INSULIN 16.3 03/17/2018   INSULIN 20.9 12/08/2017   CBC    Component Value Date/Time   WBC 4.7 05/19/2018 1136   RBC 4.43 05/19/2018 1136   HGB 12.9 05/19/2018 1136   HGB 12.9 12/08/2017 1201   HCT 38.2 05/19/2018 1136   HCT 39.5 12/08/2017 1201   PLT 347.0 05/19/2018 1136   MCV 86.1 05/19/2018 1136   MCV 89 12/08/2017 1201   MCH 29.5 12/14/2017 2303   MCHC 33.8  05/19/2018 1136   RDW 14.2 05/19/2018 1136   RDW 15.0 12/08/2017 1201   LYMPHSABS 2.0 12/08/2017 1201   MONOABS 0.3 02/01/2014 1005   EOSABS 0.4 12/08/2017 1201   BASOSABS 0.0 12/08/2017 1201   Iron/TIBC/Ferritin/ %Sat No results found for: IRON, TIBC, FERRITIN, IRONPCTSAT Lipid Panel     Component Value Date/Time   CHOL 131 03/17/2018 1016   TRIG 63 03/17/2018 1016   HDL 38 (L) 03/17/2018 1016   CHOLHDL 7 12/24/2016 1142   VLDL 27.4 12/24/2016 1142   LDLCALC 80 03/17/2018 1016   LDLDIRECT 177 (H) 10/28/2010 0916   Hepatic Function Panel     Component Value Date/Time   PROT 7.9 05/19/2018 1136   PROT 7.4 12/08/2017 1201   ALBUMIN 4.1 05/19/2018 1136   ALBUMIN 4.2 12/08/2017 1201   AST 11 05/19/2018 1136   ALT 12 05/19/2018 1136   ALKPHOS 79 05/19/2018 1136   BILITOT 0.4 05/19/2018 1136   BILITOT 0.4 12/08/2017 1201      Component Value Date/Time   TSH 1.310 12/08/2017 1201   TSH 1.125 02/01/2014 1005   TSH 1.310 05/06/2011 1131      OBESITY BEHAVIORAL INTERVENTION VISIT  Today's visit was # 16   Starting weight: 335 lbs Starting date: 12/08/17 Today's weight : 307 lbs  Today's date: 09/27/2018 Total lbs lost to date: 28    09/27/2018  Height 5\' 5"  (1.651 m)  Weight 307 lb (139.3 kg) (A)  BMI (Calculated) 51.09  BLOOD PRESSURE - SYSTOLIC 811  BLOOD PRESSURE - DIASTOLIC 85   Body Fat %  53.4 %  Total Body Water (lbs) 101.8 lbs     ASK: We discussed the diagnosis of obesity with [name] today and [place]wella agreed to give [place] permission to discuss obesity behavioral modification therapy today.  ASSESS: Shaquetta has the diagnosis of obesity and her BMI today is 51.09 Paisley is in the action stage of change   ADVISE: Carime was educated on the multiple health risks of obesity as well as the benefit of weight loss to improve her health. She was advised of the need for long term treatment and the importance of lifestyle modifications to improve her current  health and to decrease her risk of future health problems.  AGREE: Multiple dietary modification options and treatment options were discussed and  Laina agreed to follow the recommendations documented in the above note.  ARRANGE: Tandra was educated on the importance of frequent visits to treat obesity as outlined per CMS and USPSTF guidelines and agreed to schedule her next follow up appointment today.  I, Trixie Dredge, am acting as transcriptionist for Ilene Qua, MD  I have reviewed the above documentation for accuracy and completeness, and I agree with the above. - Ilene Qua, MD

## 2018-09-28 LAB — INSULIN, RANDOM: INSULIN: 22 u[IU]/mL (ref 2.6–24.9)

## 2018-09-28 LAB — LIPID PANEL WITH LDL/HDL RATIO
Cholesterol, Total: 133 mg/dL (ref 100–199)
HDL: 42 mg/dL (ref >39)
LDL Calculated: 77 mg/dL (ref 0–99)
LDl/HDL Ratio: 1.8 ratio (ref 0.0–3.2)
Triglycerides: 71 mg/dL (ref 0–149)
VLDL CHOLESTEROL CAL: 14 mg/dL (ref 5–40)

## 2018-09-28 LAB — VITAMIN D 25 HYDROXY (VIT D DEFICIENCY, FRACTURES): VIT D 25 HYDROXY: 32.4 ng/mL (ref 30.0–100.0)

## 2018-09-28 LAB — HEMOGLOBIN A1C
Est. average glucose Bld gHb Est-mCnc: 126 mg/dL
Hgb A1c MFr Bld: 6 % — ABNORMAL HIGH (ref 4.8–5.6)

## 2018-10-04 DIAGNOSIS — G4733 Obstructive sleep apnea (adult) (pediatric): Secondary | ICD-10-CM | POA: Diagnosis not present

## 2018-10-17 ENCOUNTER — Ambulatory Visit (INDEPENDENT_AMBULATORY_CARE_PROVIDER_SITE_OTHER): Payer: BLUE CROSS/BLUE SHIELD | Admitting: Family Medicine

## 2018-10-20 ENCOUNTER — Encounter (INDEPENDENT_AMBULATORY_CARE_PROVIDER_SITE_OTHER): Payer: Self-pay

## 2018-10-24 ENCOUNTER — Encounter (INDEPENDENT_AMBULATORY_CARE_PROVIDER_SITE_OTHER): Payer: Self-pay | Admitting: Family Medicine

## 2018-10-25 ENCOUNTER — Other Ambulatory Visit (INDEPENDENT_AMBULATORY_CARE_PROVIDER_SITE_OTHER): Payer: Self-pay

## 2018-10-25 DIAGNOSIS — R7303 Prediabetes: Secondary | ICD-10-CM

## 2018-10-25 MED ORDER — METFORMIN HCL 500 MG PO TABS: 500.0000 mg | ORAL_TABLET | Freq: Every day | ORAL | 0 refills | Status: DC

## 2018-10-26 ENCOUNTER — Other Ambulatory Visit: Payer: Self-pay | Admitting: Internal Medicine

## 2018-10-26 ENCOUNTER — Other Ambulatory Visit (INDEPENDENT_AMBULATORY_CARE_PROVIDER_SITE_OTHER): Payer: Self-pay | Admitting: Family Medicine

## 2018-10-26 DIAGNOSIS — I10 Essential (primary) hypertension: Secondary | ICD-10-CM

## 2018-10-26 DIAGNOSIS — E559 Vitamin D deficiency, unspecified: Secondary | ICD-10-CM

## 2018-10-31 ENCOUNTER — Encounter (INDEPENDENT_AMBULATORY_CARE_PROVIDER_SITE_OTHER): Payer: Self-pay | Admitting: Family Medicine

## 2018-10-31 ENCOUNTER — Other Ambulatory Visit: Payer: Self-pay

## 2018-10-31 ENCOUNTER — Ambulatory Visit (INDEPENDENT_AMBULATORY_CARE_PROVIDER_SITE_OTHER): Payer: BLUE CROSS/BLUE SHIELD | Admitting: Family Medicine

## 2018-10-31 DIAGNOSIS — Z6841 Body Mass Index (BMI) 40.0 and over, adult: Secondary | ICD-10-CM

## 2018-10-31 DIAGNOSIS — R7303 Prediabetes: Secondary | ICD-10-CM

## 2018-10-31 DIAGNOSIS — I10 Essential (primary) hypertension: Secondary | ICD-10-CM

## 2018-10-31 DIAGNOSIS — E559 Vitamin D deficiency, unspecified: Secondary | ICD-10-CM | POA: Diagnosis not present

## 2018-10-31 MED ORDER — VITAMIN D (ERGOCALCIFEROL) 1.25 MG (50000 UNIT) PO CAPS: 50000.0000 [IU] | ORAL_CAPSULE | ORAL | 0 refills | Status: DC

## 2018-10-31 NOTE — Progress Notes (Signed)
Office: [phone]  /  Fax: [phone] TeleHealth Visit:  Danielle Shaw has verbally consented to this TeleHealth visit today. The patient is located at home, the provider is located at the [facility] office. The participants in this visit include the listed provider and patient and provider's assistant. The visit was conducted today via webex.  HPI:   Chief Complaint: OBESITY Danielle Shaw is here to discuss her progress with her obesity treatment plan. She is on the Category 3 plan and is following her eating plan approximately 90 % of the time. She states she is walking for 30-45 minutes 3 times per week. Danielle Shaw has not seen since March 3rd. She has been working night shifts since the pandemic started. She is taking her dinner with her to work. She is doing a good amount of walking. Her daughter is living at home. We were unable to weigh the patient today for this TeleHealth visit. She feels as if she has maintained her weight since her last visit. She has lost 28 lbs since starting treatment with [place].  Vitamin D Deficiency Danielle Shaw has a diagnosis of vitamin D deficiency. She is currently taking prescription Vit D. She notes fatigue and denies nausea, vomiting or muscle weakness.  Pre-Diabetes Danielle Shaw has a diagnosis of pre-diabetes based on her elevated Hgb A1c and was informed this puts her at greater risk of developing diabetes. She is taking metformin 500 mg daily. She notes minimal carbohydrate cravings. She continues to work on diet and exercise to decrease risk of diabetes. She denies nausea or hypoglycemia.  Hypertension Danielle Shaw is a 48 y.o. female with hypertension. Danielle Shaw is not checking her blood pressure at home. Her blood pressure was previously elevated on losartan. She denies chest pain. She is working on weight loss to help control her blood pressure with the goal of decreasing her risk of heart attack and stroke. Danielle Shaw's blood pressure is not currently  controlled.  ASSESSMENT AND PLAN:  Vitamin D deficiency - Plan: Vitamin D, Ergocalciferol, (DRISDOL) 1.25 MG (50000 UT) CAPS capsule  Prediabetes  Essential hypertension  Class 3 severe obesity with serious comorbidity and body mass index (BMI) of 50.0 to 59.9 in adult, unspecified obesity type ([place])  PLAN:  Vitamin D Deficiency Danielle Shaw was informed that low vitamin D levels contributes to fatigue and are associated with obesity, breast, and colon cancer. Danielle Shaw agrees to continue taking prescription Vit D @50 ,000 IU every week #4 and we will refill for 1 month. She will follow up for routine testing of vitamin D, at least 2-3 times per year. She was informed of the risk of over-replacement of vitamin D and agrees to not increase her dose unless she discusses this with [place] first. Danielle Shaw agrees to follow up with our clinic in 2 weeks.  Pre-Diabetes Danielle Shaw will continue to work on weight loss, exercise, and decreasing simple carbohydrates in her diet to help decrease the risk of diabetes. We dicussed metformin including benefits and risks. She was informed that eating too many simple carbohydrates or too many calories at one sitting increases the likelihood of GI side effects. Danielle Shaw agrees to continue taking metformin and we will repeat labs at the end of June. Danielle Shaw agrees to follow up with our clinic in 2 weeks as directed to monitor her progress.  Hypertension We discussed sodium restriction, working on healthy weight loss, and a regular exercise program as the means to achieve improved blood pressure control. Danielle Shaw agreed with this plan and  agreed to follow up as directed. We will continue to monitor her blood pressure as well as her progress with the above lifestyle modifications. Danielle Shaw agrees to continue her medications and will watch for signs of hypotension as she continues her lifestyle modifications. She is to discuss decrease in medications with Dr. Jerilee Hoh at her primary care physician  appointment that is already scheduled. Danielle Shaw agrees to follow up with our clinic in 2 weeks.  Obesity Danielle Shaw is currently in the action stage of change. As such, her goal is to continue with weight loss efforts She has agreed to follow the Category 3 plan Danielle Shaw has been instructed to work up to a goal of 150 minutes of combined cardio and strengthening exercise per week for weight loss and overall health benefits. We discussed the following Behavioral Modification Strategies today: increasing lean protein intake, increasing vegetables, work on meal planning and easy cooking plans, and planning for success   Danielle Shaw has agreed to follow up with our clinic in 2 weeks. She was informed of the importance of frequent follow up visits to maximize her success with intensive lifestyle modifications for her multiple health conditions.  ALLERGIES: Allergies  Allergen Reactions   Latex    Lisinopril Cough   Metoprolol Other (See Comments)    Funny feeling    MEDICATIONS: Current Outpatient Medications on File Prior to Visit  Medication Sig Dispense Refill   albuterol (PROVENTIL HFA;VENTOLIN HFA) 108 (90 Base) MCG/ACT inhaler INHALE TWO PUFFS BY MOUTH EVERY 6 HOURS AS NEEDED FOR WHEEZING OR SHORTNESS OF BREATH (Patient taking differently: Inhale 2 puffs into the lungs every 6 (six) hours as needed for wheezing or shortness of breath. INHALE TWO PUFFS BY MOUTH EVERY 6 HOURS AS NEEDED FOR WHEEZING OR SHORTNESS OF BREATH) 18 each 1   aspirin 81 MG tablet Take 81 mg by mouth daily.      aspirin-acetaminophen-caffeine (EXCEDRIN MIGRAINE) 250-250-65 MG tablet Take 1 tablet by mouth every 6 (six) hours as needed for headache.     cyclobenzaprine (FLEXERIL) 5 MG tablet Take 1 tablet (5 mg total) by mouth 3 (three) times daily as needed for muscle spasms. 30 tablet 1   hydrochlorothiazide (HYDRODIURIL) 25 MG tablet Take 1 tablet (25 mg total) by mouth daily. 90 tablet 3   HYDROcodone-homatropine  (HYCODAN) 5-1.5 MG/5ML syrup Take 5-10 mLs by mouth at bedtime as needed for cough. 50 mL 0   HYDROcodone-homatropine (HYCODAN) 5-1.5 MG/5ML syrup Take 5 mLs by mouth every 8 (eight) hours as needed for cough. 120 mL 0   ibuprofen (ADVIL,MOTRIN) 600 MG tablet Take 1 tablet (600 mg total) by mouth every 6 (six) hours as needed. (Patient taking differently: Take 600 mg by mouth every 6 (six) hours as needed (pain). ) 30 tablet 1   ipratropium (ATROVENT) 0.03 % nasal spray Place 2 sprays into both nostrils every 12 (twelve) hours. (Patient taking differently: Place 2 sprays into both nostrils daily. ) 30 mL 12   levocetirizine (XYZAL) 5 MG tablet Take 5 mg by mouth every evening.     losartan (COZAAR) 100 MG tablet Take 1 tablet (100 mg total) by mouth daily. 90 tablet 3   meloxicam (MOBIC) 15 MG tablet Take 1 tablet (15 mg total) by mouth daily as needed for pain. 30 tablet 1   metFORMIN (GLUCOPHAGE) 500 MG tablet Take 1 tablet (500 mg total) by mouth daily with breakfast. 30 tablet 0   misoprostol (CYTOTEC) 200 MCG tablet Take 4 tablets (800 mcg total)  by mouth once for 1 dose. Take at bedtime the night before the IUD removal is scheduled. 4 tablet 0   modafinil (PROVIGIL) 200 MG tablet Take 1 tablet (200 mg total) by mouth daily. (Patient taking differently: Take 200 mg by mouth daily with lunch. ) 30 tablet 5   ondansetron (ZOFRAN) 4 MG tablet Take 1 tablet (4 mg total) by mouth every 8 (eight) hours as needed for nausea or vomiting. 50 tablet 0   pantoprazole (PROTONIX) 40 MG tablet Take 1 tablet (40 mg total) by mouth daily. 90 tablet 3   rosuvastatin (CRESTOR) 20 MG tablet Take 1 tablet (20 mg total) by mouth daily. 90 tablet 1   traMADol (ULTRAM) 50 MG tablet Take 1 tablet (50 mg total) by mouth every 8 (eight) hours as needed. (Patient taking differently: Take 50 mg by mouth every 8 (eight) hours as needed (pain). ) 30 tablet 0   No current facility-administered medications on file  prior to visit.     PAST MEDICAL HISTORY: Past Medical History:  Diagnosis Date   Arrhythmia    Arthritis    GERD (gastroesophageal reflux disease)    Hyperlipidemia    Hypertension    IUD 2004   IUD 2009   Migraine    SOB (shortness of breath)     PAST SURGICAL HISTORY: Past Surgical History:  Procedure Laterality Date   BREAST BIOPSY Right 2011   BREAST SURGERY  2000   reduction   CESAREAN SECTION     COLONOSCOPY WITH PROPOFOL N/A 07/11/2018   Procedure: COLONOSCOPY WITH PROPOFOL;  Surgeon: Thornton Park, MD;  Location: [facility];  Service: Gastroenterology;  Laterality: N/A;    SOCIAL HISTORY: Social History   Tobacco Use   Smoking status: Never Smoker   Smokeless tobacco: Never Used  Substance Use Topics   Alcohol use: No    Alcohol/week: 0.0 standard drinks   Drug use: No    FAMILY HISTORY: Family History  Problem Relation Age of Onset   Heart disease Mother 43   High Cholesterol Mother    High blood pressure Mother    Diabetes Father    Heart disease Father    High blood pressure Father    High Cholesterol Father    Heart disease Maternal Grandmother    Hodgkin's lymphoma Daughter     ROS: Review of Systems  Constitutional: Positive for malaise/fatigue. Negative for weight loss.  Cardiovascular: Negative for chest pain.  Gastrointestinal: Negative for nausea and vomiting.  Musculoskeletal:       Negative muscle weakness  Endo/Heme/Allergies:       Negative hypoglycemia    PHYSICAL EXAM: Pt in no acute distress  RECENT LABS AND TESTS: BMET    Component Value Date/Time   NA 139 05/19/2018 1136   NA 139 12/08/2017 1201   K 3.7 05/19/2018 1136   CL 102 05/19/2018 1136   CO2 29 05/19/2018 1136   GLUCOSE 88 05/19/2018 1136   BUN 11 05/19/2018 1136   BUN 10 12/08/2017 1201   CREATININE 0.70 05/19/2018 1136   CREATININE 0.64 02/01/2014 1005   CALCIUM 9.4 05/19/2018 1136   GFRNONAA >60 12/14/2017 2303    GFRAA >60 12/14/2017 2303   Lab Results  Component Value Date   HGBA1C 6.0 (H) 09/27/2018   HGBA1C 6.0 (H) 03/17/2018   HGBA1C 6.2 (H) 12/08/2017   HGBA1C 6.4 12/24/2016   HGBA1C 6.0 01/21/2016   Lab Results  Component Value Date   INSULIN 22.0 09/27/2018  INSULIN 16.3 03/17/2018   INSULIN 20.9 12/08/2017   CBC    Component Value Date/Time   WBC 4.7 05/19/2018 1136   RBC 4.43 05/19/2018 1136   HGB 12.9 05/19/2018 1136   HGB 12.9 12/08/2017 1201   HCT 38.2 05/19/2018 1136   HCT 39.5 12/08/2017 1201   PLT 347.0 05/19/2018 1136   MCV 86.1 05/19/2018 1136   MCV 89 12/08/2017 1201   MCH 29.5 12/14/2017 2303   MCHC 33.8 05/19/2018 1136   RDW 14.2 05/19/2018 1136   RDW 15.0 12/08/2017 1201   LYMPHSABS 2.0 12/08/2017 1201   MONOABS 0.3 02/01/2014 1005   EOSABS 0.4 12/08/2017 1201   BASOSABS 0.0 12/08/2017 1201   Iron/TIBC/Ferritin/ %Sat No results found for: IRON, TIBC, FERRITIN, IRONPCTSAT Lipid Panel     Component Value Date/Time   CHOL 133 09/27/2018 0825   TRIG 71 09/27/2018 0825   HDL 42 09/27/2018 0825   CHOLHDL 7 12/24/2016 1142   VLDL 27.4 12/24/2016 1142   LDLCALC 77 09/27/2018 0825   LDLDIRECT 177 (H) 10/28/2010 0916   Hepatic Function Panel     Component Value Date/Time   PROT 7.9 05/19/2018 1136   PROT 7.4 12/08/2017 1201   ALBUMIN 4.1 05/19/2018 1136   ALBUMIN 4.2 12/08/2017 1201   AST 11 05/19/2018 1136   ALT 12 05/19/2018 1136   ALKPHOS 79 05/19/2018 1136   BILITOT 0.4 05/19/2018 1136   BILITOT 0.4 12/08/2017 1201      Component Value Date/Time   TSH 1.310 12/08/2017 1201   TSH 1.125 02/01/2014 1005   TSH 1.310 05/06/2011 1131      I, Trixie Dredge, am acting as Location manager for Ilene Qua, MD  I have reviewed the above documentation for accuracy and completeness, and I agree with the above. - Ilene Qua, MD

## 2018-11-01 ENCOUNTER — Ambulatory Visit: Payer: BLUE CROSS/BLUE SHIELD | Admitting: Internal Medicine

## 2018-11-14 ENCOUNTER — Ambulatory Visit (INDEPENDENT_AMBULATORY_CARE_PROVIDER_SITE_OTHER): Payer: BLUE CROSS/BLUE SHIELD | Admitting: Family Medicine

## 2018-11-14 IMAGING — US US RENAL
1 series · 14 of 25 positions shown · non-contrast
Comparison: CT [DATE]

CLINICAL DATA: Left flank pain 2 months, possible stones. No
hematuria.

EXAM:
RENAL / URINARY TRACT ULTRASOUND COMPLETE

[Series 1: us renal · 0.23mm/px · 14 of 31 slices shown]
[im 1/31]
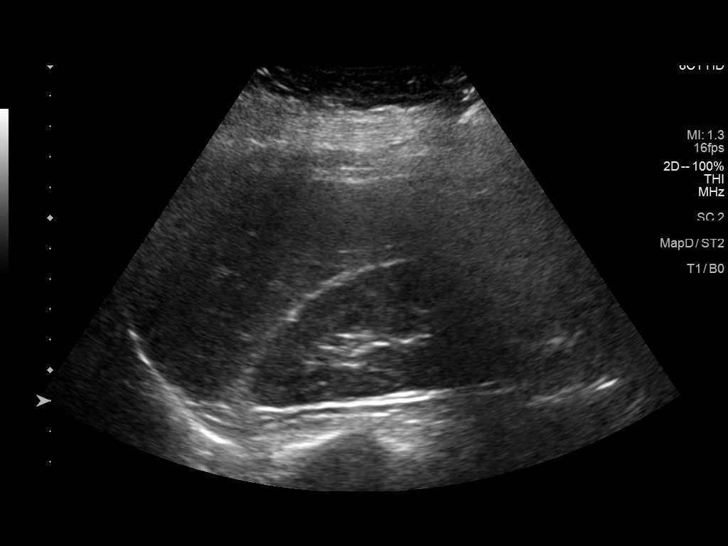
[im 3/31]
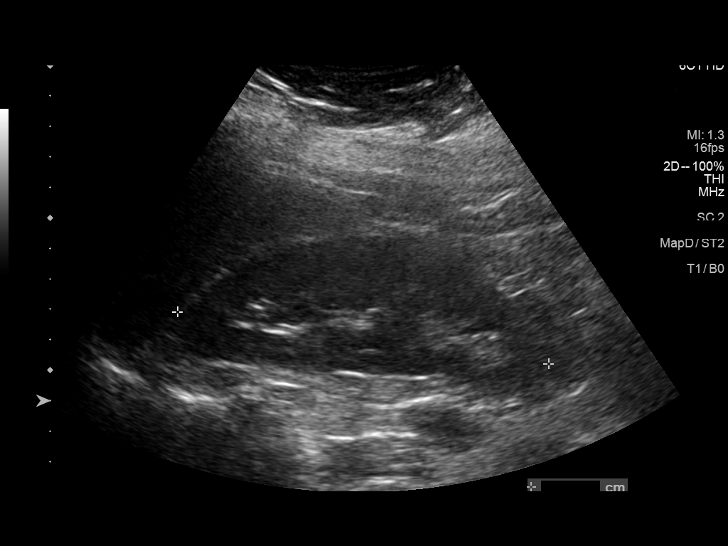
[im 6/31]
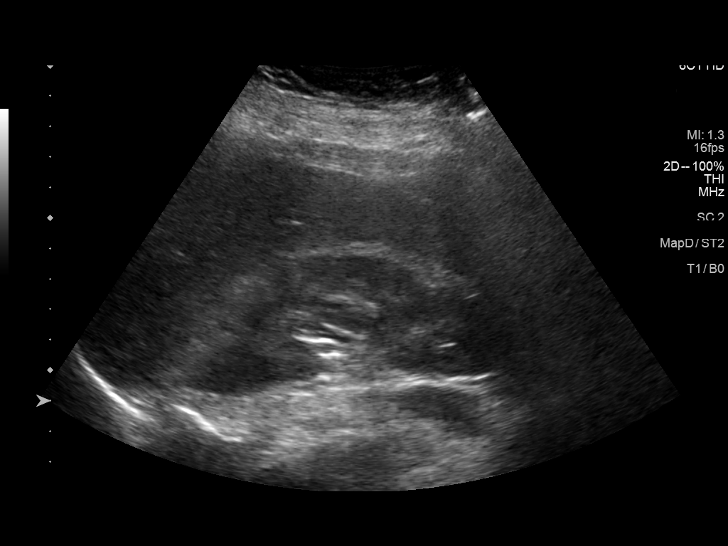
[im 8/31]
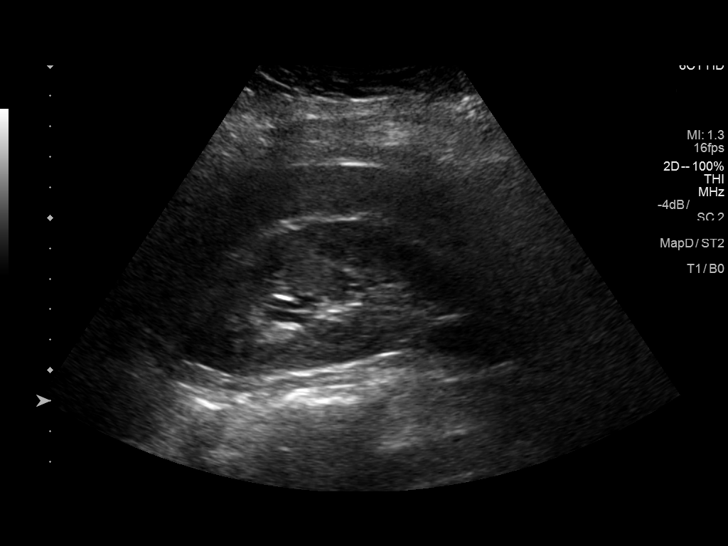
[im 11/31]
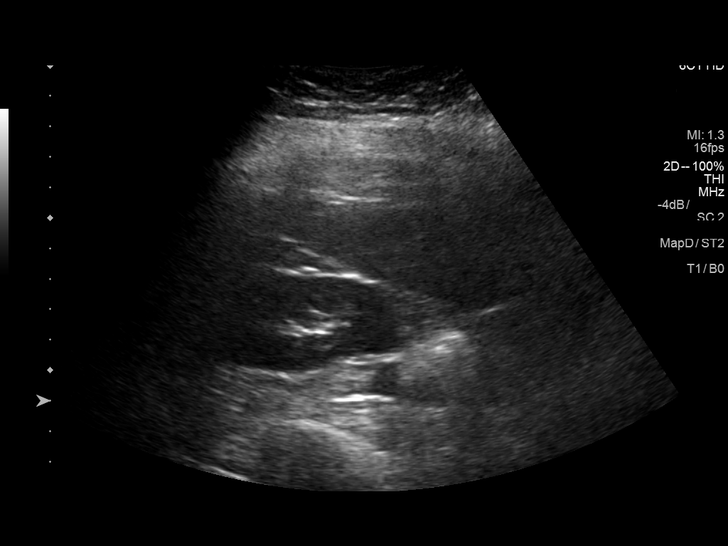
[im 12/31]
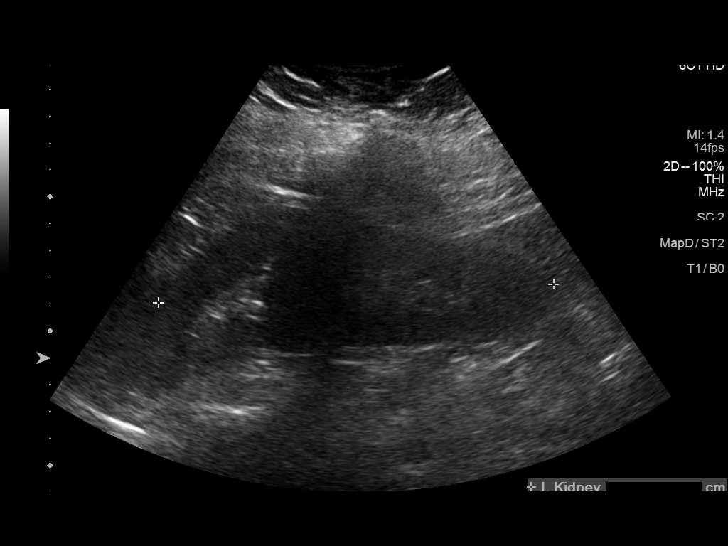
[im 14/31]
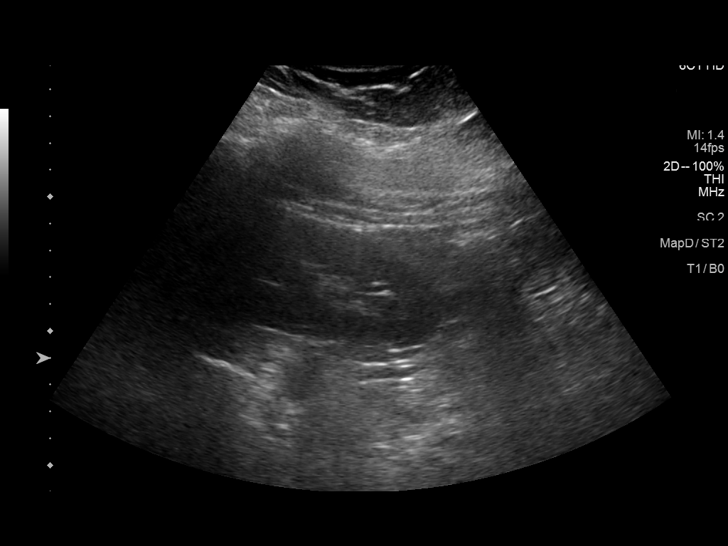
[im 17/31]
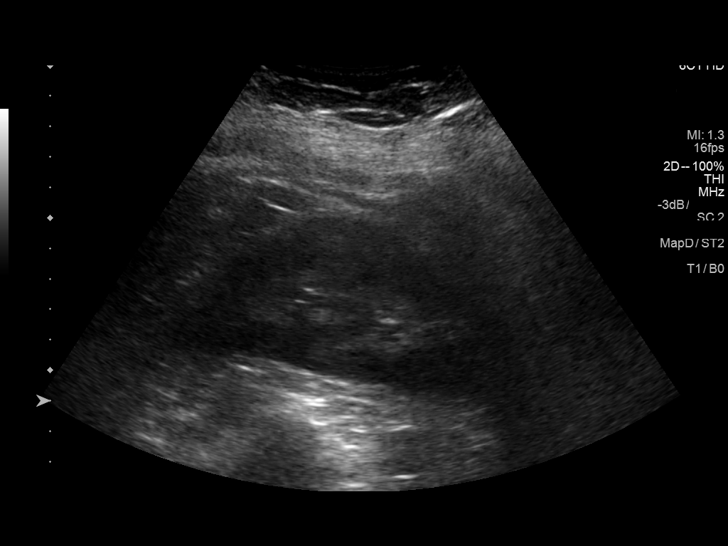
[im 19/31]
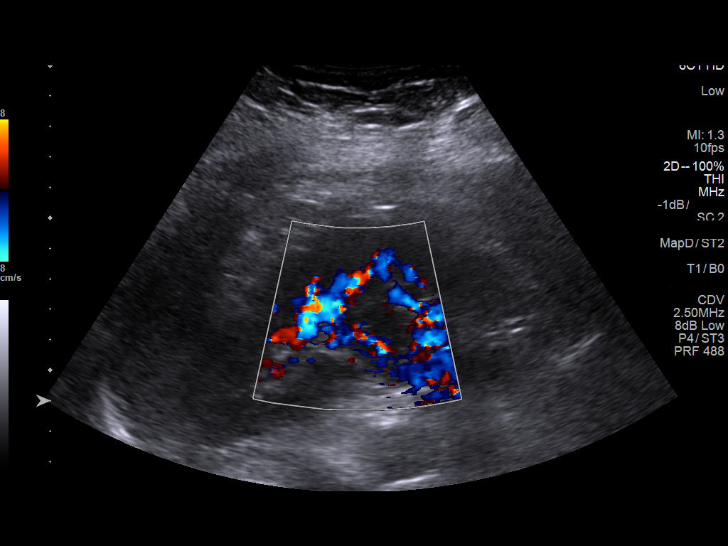
[im 21/31]
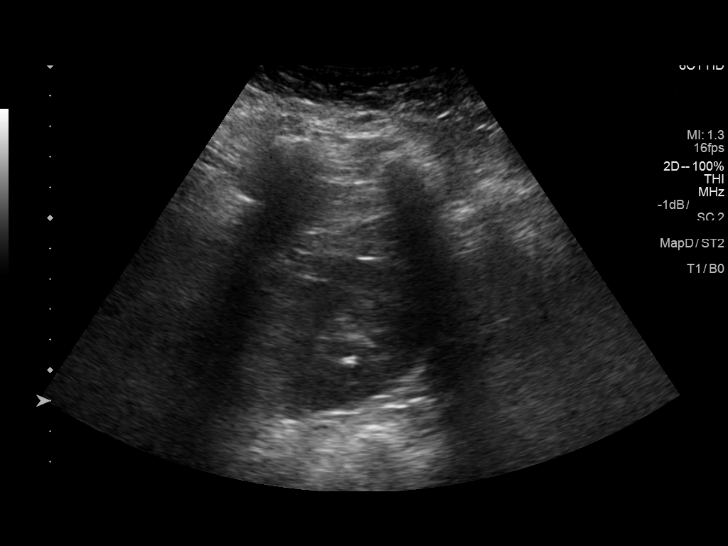
[im 23/31]
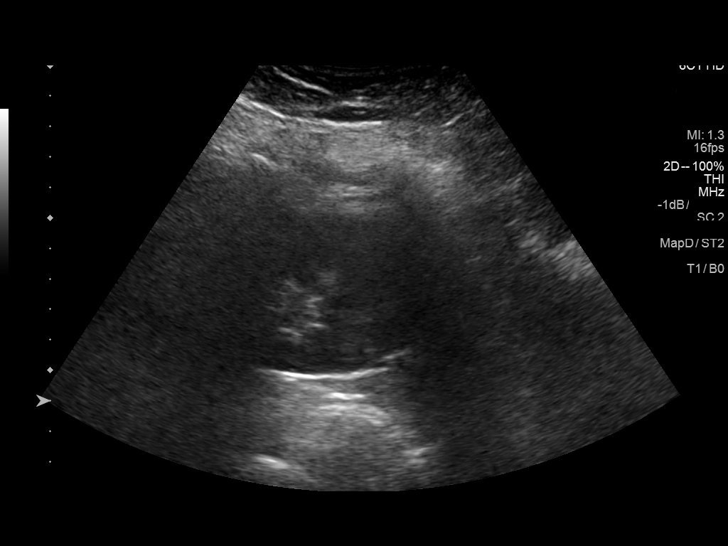
[im 26/31]
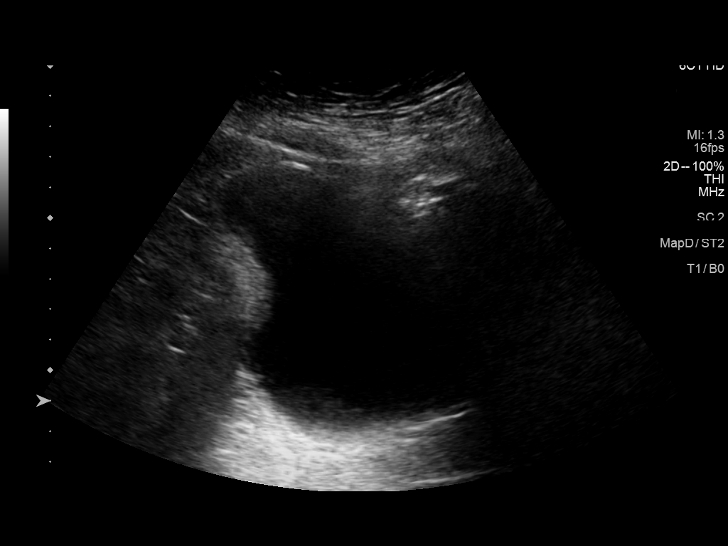
[im 28/31]
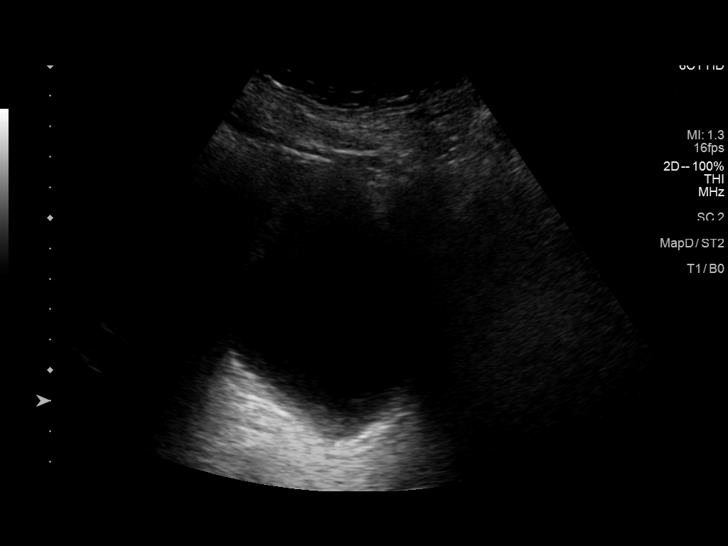
[im 31/31]
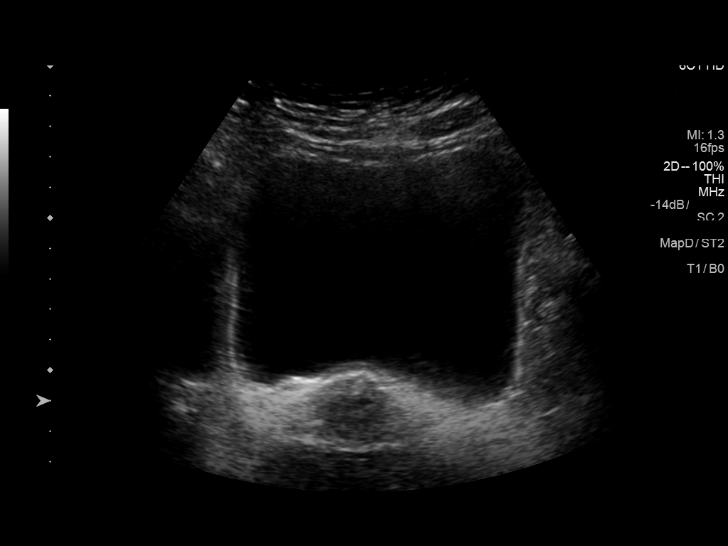

[14 of 25 positions shown; findings below may reference images not displayed]

FINDINGS: Right Kidney:

Length: 12.5 cm. Echogenicity within normal limits. No mass or
hydronephrosis visualized.

Left Kidney:

Length: 14.8 cm. Echogenicity within normal limits. No mass or
hydronephrosis visualized.

Bladder:

Appears normal for degree of bladder distention.
IMPRESSION: Normal size kidneys without hydronephrosis or nephrolithiasis.

## 2018-12-12 ENCOUNTER — Telehealth: Payer: Self-pay

## 2018-12-12 NOTE — Telephone Encounter (Signed)
Patient stated that she currently doesn't have insurance to do a doxy.me visit and stated that she would call us back to schedule an appt once she has insurance. Appt has been cancelled.

## 2018-12-14 ENCOUNTER — Ambulatory Visit: Payer: BLUE CROSS/BLUE SHIELD | Admitting: Adult Health

## 2019-06-28 DIAGNOSIS — M545 Low back pain: Secondary | ICD-10-CM | POA: Diagnosis not present

## 2019-06-28 DIAGNOSIS — M542 Cervicalgia: Secondary | ICD-10-CM | POA: Diagnosis not present

## 2019-06-28 DIAGNOSIS — M629 Disorder of muscle, unspecified: Secondary | ICD-10-CM | POA: Diagnosis not present

## 2019-06-28 DIAGNOSIS — M25512 Pain in left shoulder: Secondary | ICD-10-CM | POA: Diagnosis not present

## 2019-10-05 ENCOUNTER — Ambulatory Visit: Payer: Self-pay | Attending: Family

## 2019-10-05 DIAGNOSIS — Z23 Encounter for immunization: Secondary | ICD-10-CM

## 2019-10-05 NOTE — Progress Notes (Signed)
   Covid-19 Vaccination Clinic  Name:  MADELYN STOBBE    MRN: [id number] DOB: 12-10-1970  10/05/2019  Ms. Stigers was observed post Covid-19 immunization for 15 minutes without incident. She was provided with Vaccine Information Sheet and instruction to access the V-Safe system.   Ms. Raifsnider was instructed to call 911 with any severe reactions post vaccine: [name] Difficulty breathing  . Swelling of face and throat  . A fast heartbeat  . A bad rash all over body  . Dizziness and weakness   Immunizations Administered    Name Date Dose VIS Date Route   Moderna COVID-19 Vaccine 10/05/2019  4:09 PM 0.5 mL 06/27/2019 Intramuscular   Manufacturer: Moderna   Lot: [id number]   [place]:[id number]

## 2019-10-31 ENCOUNTER — Other Ambulatory Visit: Payer: Self-pay | Admitting: Orthopedic Surgery

## 2019-10-31 DIAGNOSIS — M25562 Pain in left knee: Secondary | ICD-10-CM

## 2019-10-31 DIAGNOSIS — M545 Low back pain, unspecified: Secondary | ICD-10-CM

## 2019-10-31 DIAGNOSIS — M25512 Pain in left shoulder: Secondary | ICD-10-CM

## 2019-10-31 DIAGNOSIS — M25572 Pain in left ankle and joints of left foot: Secondary | ICD-10-CM

## 2019-10-31 DIAGNOSIS — M542 Cervicalgia: Secondary | ICD-10-CM

## 2019-11-07 ENCOUNTER — Ambulatory Visit
Admission: RE | Admit: 2019-11-07 | Discharge: 2019-11-07 | Disposition: A | Discharge status: Home / Self Care | Payer: Self-pay | Source: Ambulatory Visit | Attending: Orthopedic Surgery | Admitting: Orthopedic Surgery

## 2019-11-07 ENCOUNTER — Other Ambulatory Visit: Payer: Self-pay

## 2019-11-07 ENCOUNTER — Ambulatory Visit: Payer: Self-pay | Attending: Family

## 2019-11-07 DIAGNOSIS — M25562 Pain in left knee: Secondary | ICD-10-CM

## 2019-11-07 DIAGNOSIS — M545 Low back pain, unspecified: Secondary | ICD-10-CM

## 2019-11-07 DIAGNOSIS — M542 Cervicalgia: Secondary | ICD-10-CM

## 2019-11-07 DIAGNOSIS — M25572 Pain in left ankle and joints of left foot: Secondary | ICD-10-CM

## 2019-11-07 DIAGNOSIS — Z23 Encounter for immunization: Secondary | ICD-10-CM

## 2019-11-07 DIAGNOSIS — M25512 Pain in left shoulder: Secondary | ICD-10-CM
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
## 2019-11-07 IMAGING — CR DG ANKLE COMPLETE 3+V*L*
3 series · 3 of 3 positions shown · non-contrast
Comparison: None.

CLINICAL DATA: Left ankle pain.

EXAM:
LEFT ANKLE COMPLETE - 3+ VIEW

[x ankle lat left]
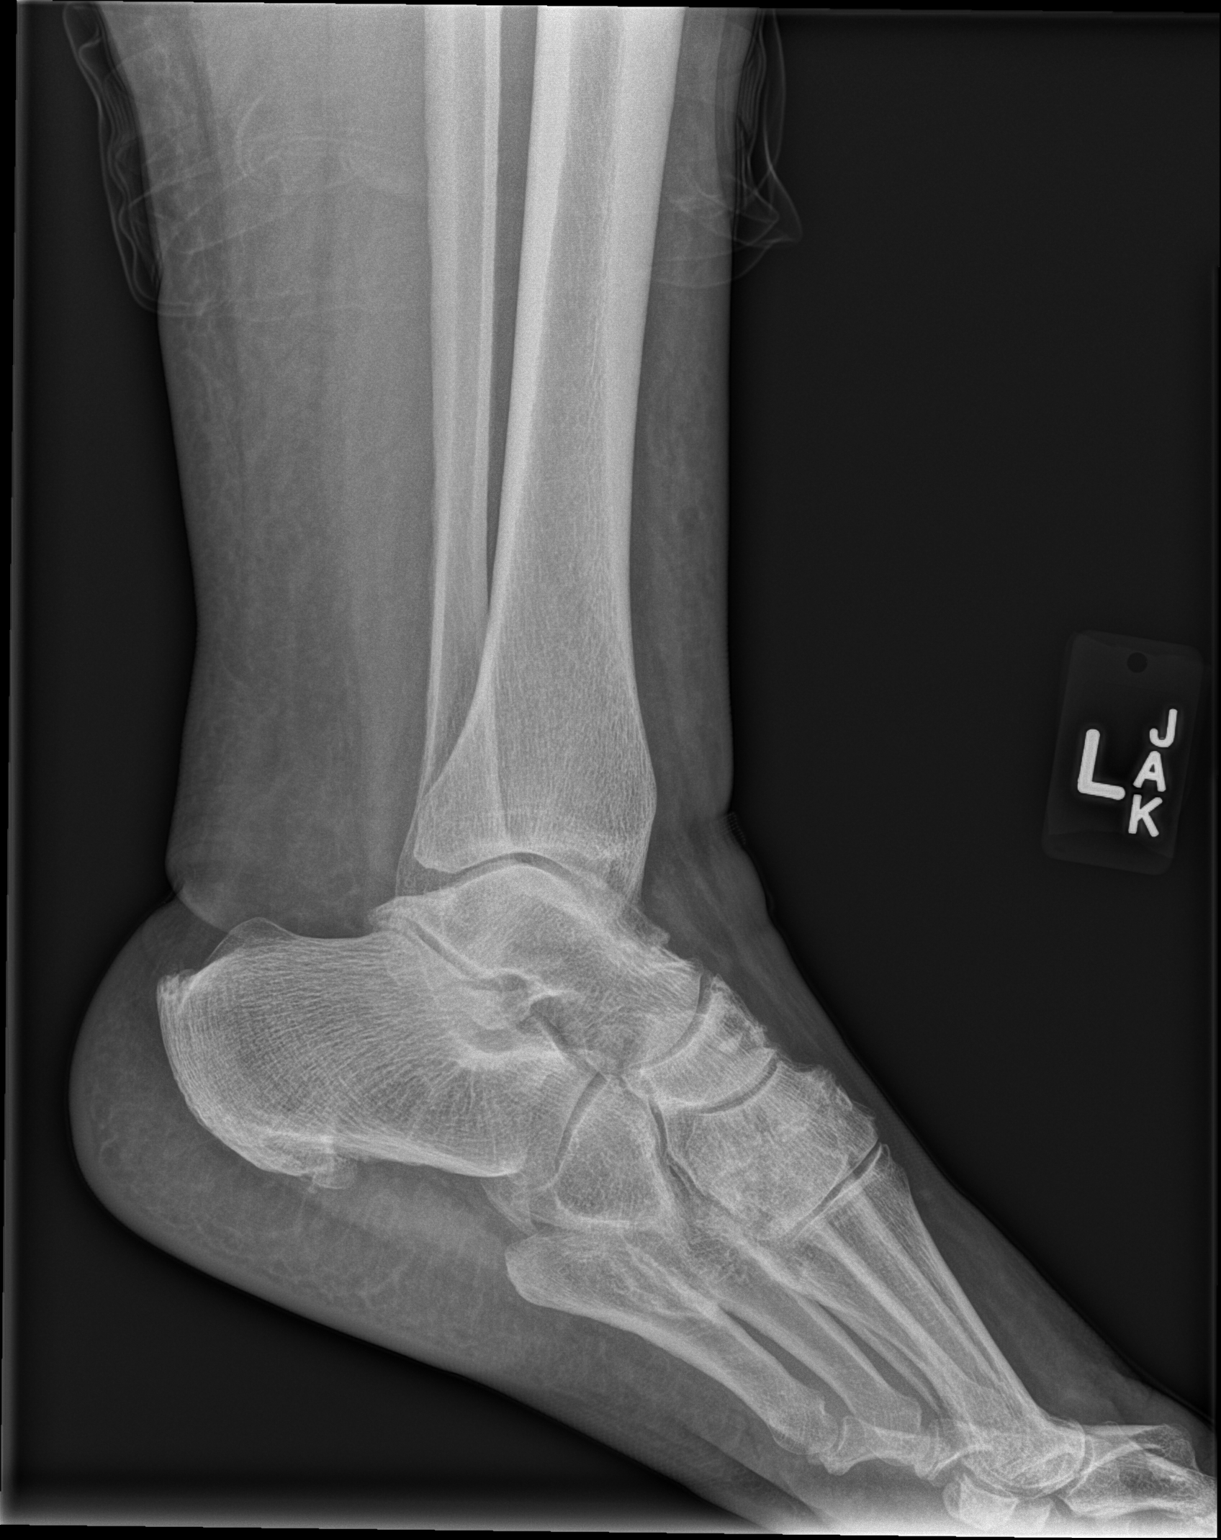

[x ankle ap left]
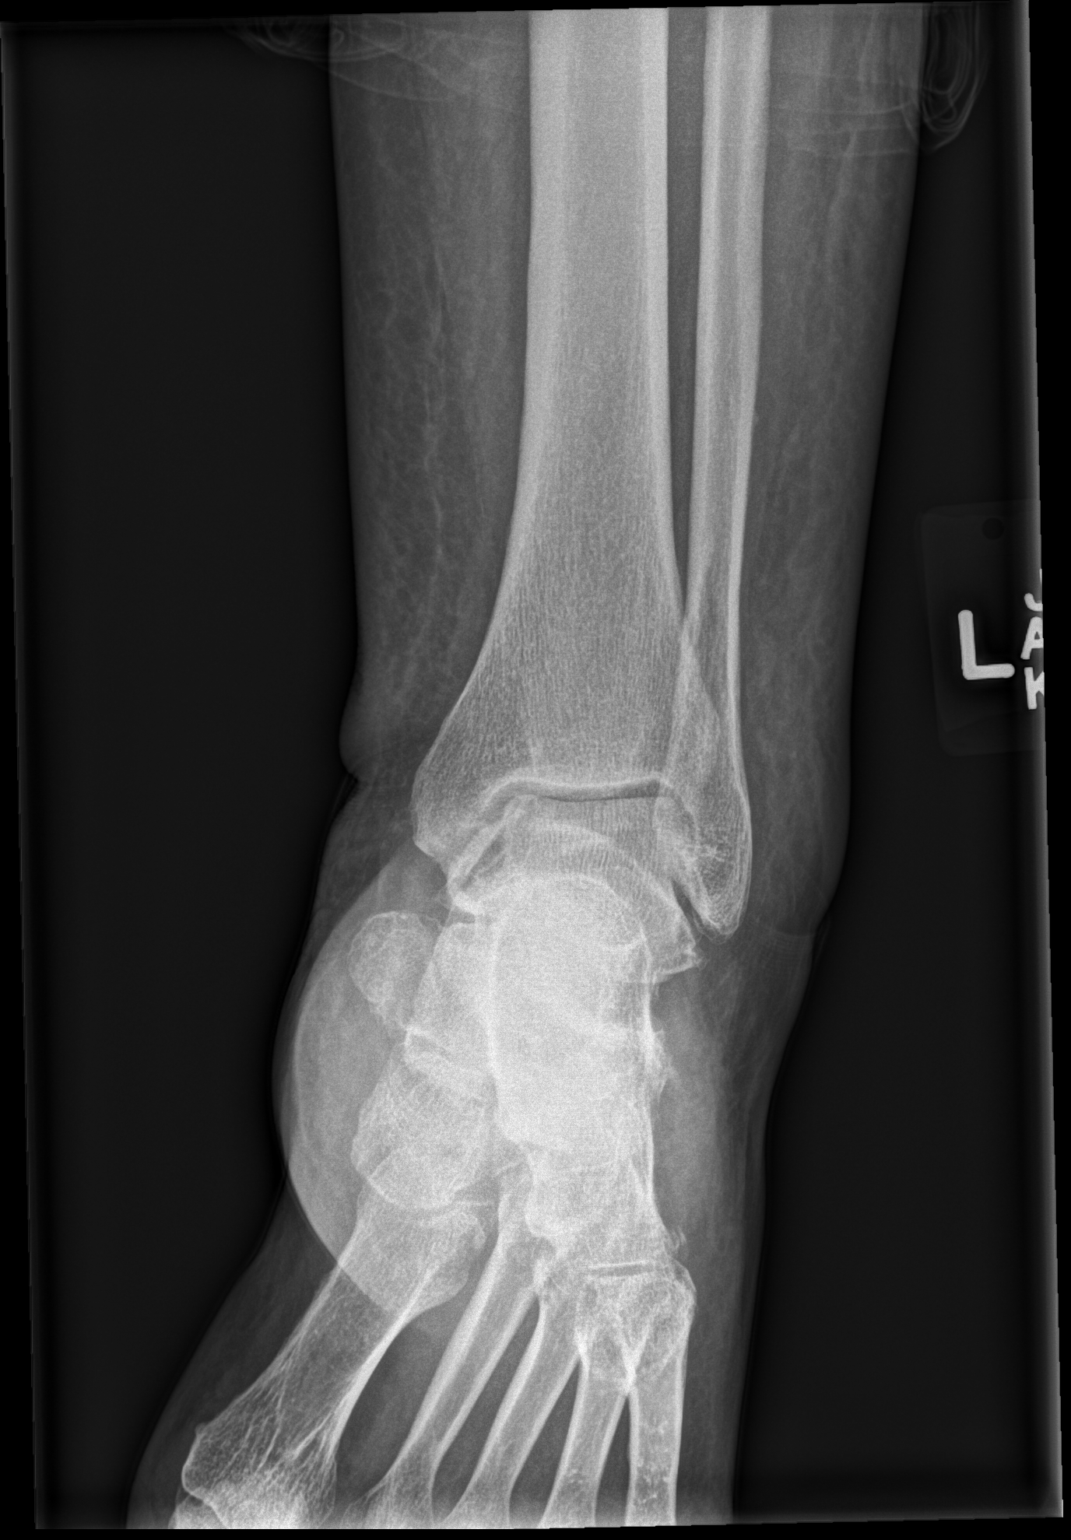

[x ankle obl left]
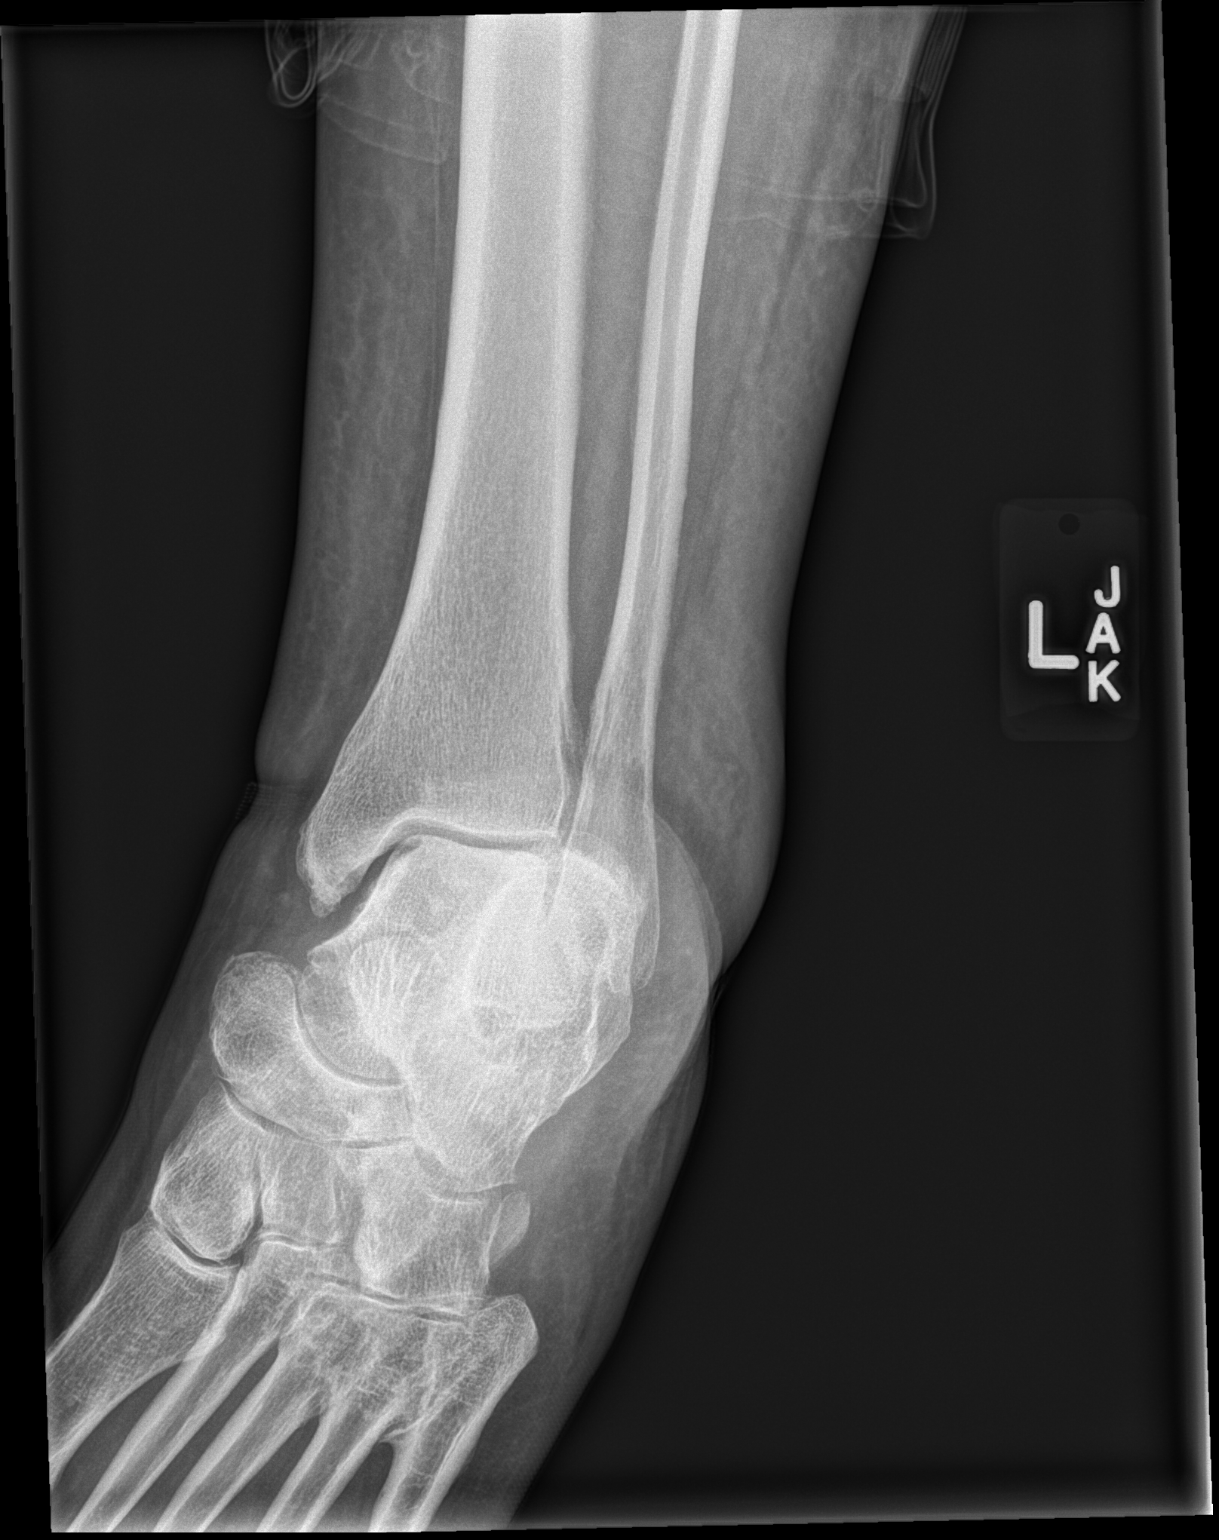

[3 of 3 positions shown; findings below may reference images not displayed]

FINDINGS: No fracture or dislocation. Suggestion of osteochondral lesion of
the medial talar dome without evidence of subchondral collapse.
Ankle mortise otherwise preserved. Mild midfoot osteoarthritis with
spurring. Small plantar calcaneal spur and Achilles tendon
enthesophyte. No definite ankle joint effusion. Generalized soft
tissue edema about the ankle versus habitus.
IMPRESSION: 1. Suggestion of osteochondral lesion of the medial talar dome
without subchondral collapse.
2. Mild midfoot osteoarthritis.
3. Plantar calcaneal spur and Achilles tendon enthesophyte.

## 2019-11-07 NOTE — Progress Notes (Signed)
   Covid-19 Vaccination Clinic  Name:  Danielle Shaw    MRN: [id number] DOB: 08-17-1970  11/07/2019  [name] was observed post Covid-19 immunization for 15 minutes without incident. She was provided with Vaccine Information Sheet and instruction to access the V-Safe system.   [name] was instructed to call 911 with any severe reactions post vaccine: [name] Difficulty breathing  . Swelling of face and throat  . A fast heartbeat  . A bad rash all over body  . Dizziness and weakness   Immunizations Administered    Name Date Dose VIS Date Route   Moderna COVID-19 Vaccine 11/07/2019  1:51 PM 0.5 mL 06/27/2019 Intramuscular   Manufacturer: Moderna   Lot: [id number]   [place][id number]

## 2019-11-12 ENCOUNTER — Ambulatory Visit
Admission: RE | Admit: 2019-11-12 | Discharge: 2019-11-12 | Disposition: A | Discharge status: Home / Self Care | Payer: No Typology Code available for payment source | Source: Ambulatory Visit | Attending: Orthopedic Surgery | Admitting: Orthopedic Surgery

## 2019-11-12 ENCOUNTER — Other Ambulatory Visit: Payer: Self-pay | Admitting: Orthopedic Surgery

## 2019-11-12 ENCOUNTER — Other Ambulatory Visit: Payer: Self-pay

## 2019-11-12 DIAGNOSIS — M542 Cervicalgia: Secondary | ICD-10-CM

## 2019-11-12 DIAGNOSIS — M25562 Pain in left knee: Secondary | ICD-10-CM

## 2019-11-12 DIAGNOSIS — M545 Low back pain, unspecified: Secondary | ICD-10-CM

## 2019-11-12 DIAGNOSIS — M25512 Pain in left shoulder: Secondary | ICD-10-CM

## 2019-11-12 DIAGNOSIS — M25572 Pain in left ankle and joints of left foot: Secondary | ICD-10-CM

## 2019-11-12 IMAGING — MR MR SHOULDER*L* W/O CM
6 series · 40 of 40 positions shown · non-contrast
Comparison: Plain films left shoulder [DATE].

CLINICAL DATA: Chronic left shoulder pain.  No known injury.

EXAM:
MRI OF THE LEFT SHOULDER WITHOUT CONTRAST
TECHNIQUE: Multiplanar, multisequence MR imaging of the shoulder was performed.
No intravenous contrast was administered.

[Series 3: PD fat-sat · axial · 4.0mm · 0.55mm/px · z∈[-27,+65]mm · 8 of 20 slices shown (1 of 2)]
[im 1/20]
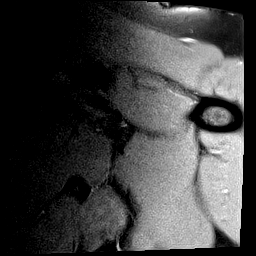
[im 3/20]
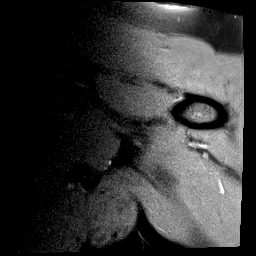
[im 6/20]
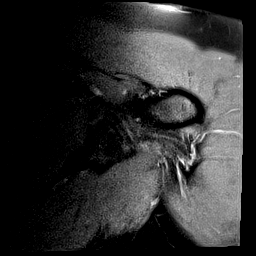
[im 9/20]
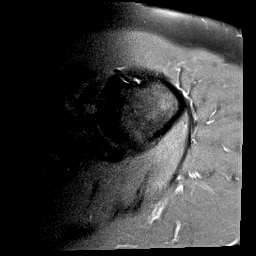
[im 11/20]
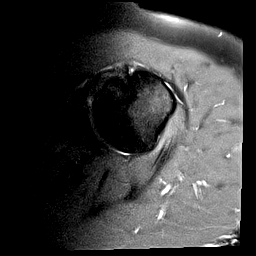
[im 14/20]
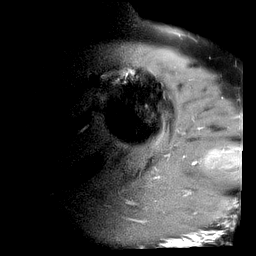
[im 17/20]
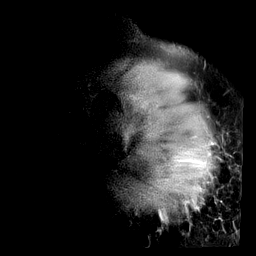
[im 20/20]
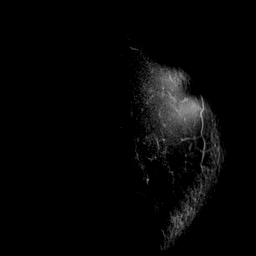

[Series 4: T2 fat-sat · oblique · 4.0mm · 0.73mm/px · 6 of 18 slices shown (1 of 3)]
[im 1/18]
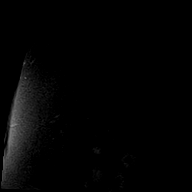
[im 4/18]
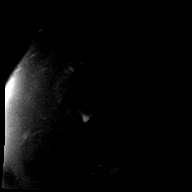
[im 7/18]
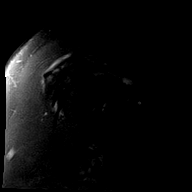
[im 11/18]
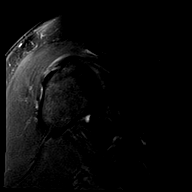
[im 14/18]
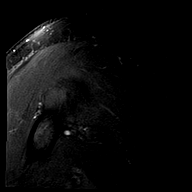
[im 18/18]
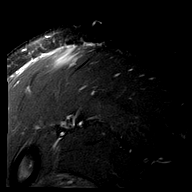

[Series 6: T1 · oblique · 4.0mm · 0.36mm/px · 7 of 20 slices shown]
[im 1/20]
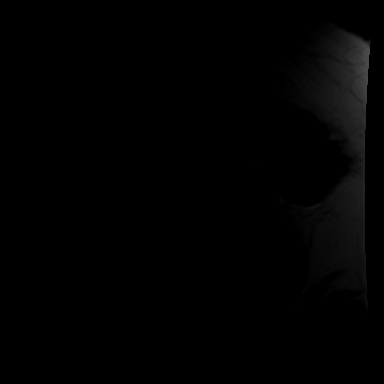
[im 4/20]
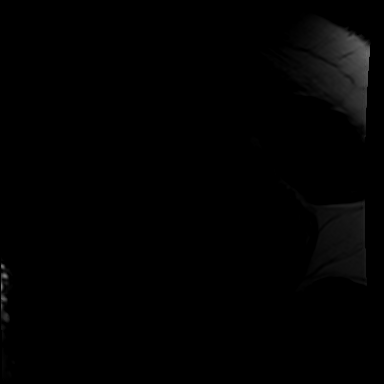
[im 7/20]
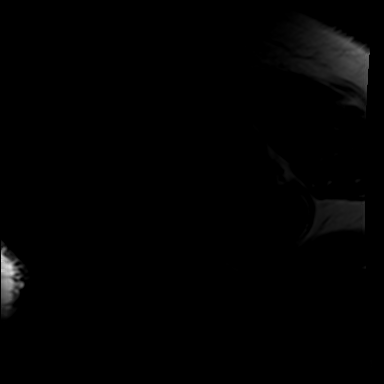
[im 10/20]
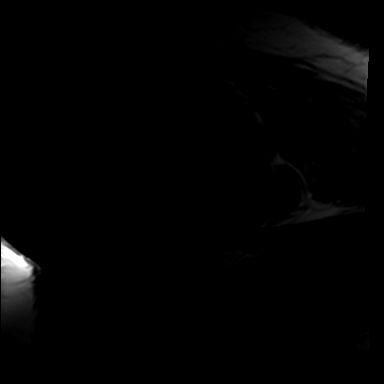
[im 13/20]
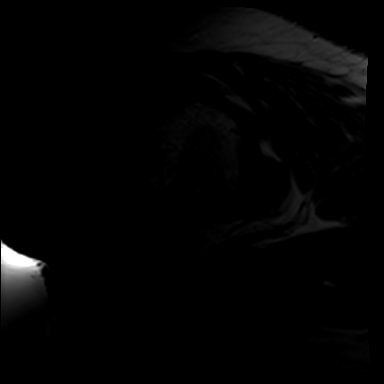
[im 16/20]
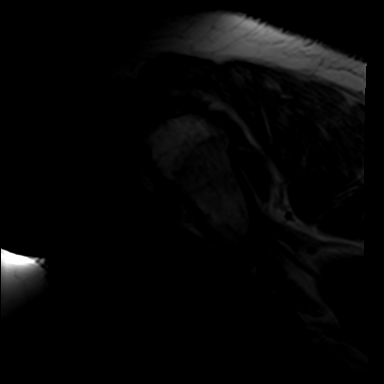
[im 20/20]
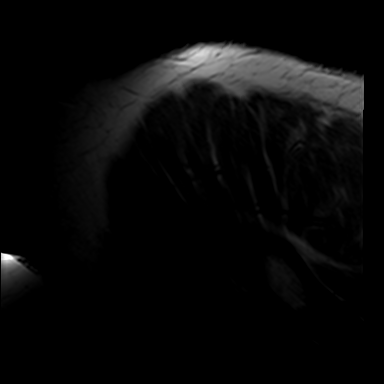

[Series 7: T2 fat-sat · oblique · 4.0mm · 0.73mm/px · 6 of 18 slices shown (2 of 3)]
[im 1/18]
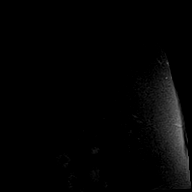
[im 4/18]
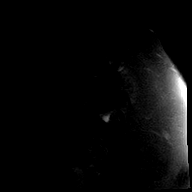
[im 7/18]
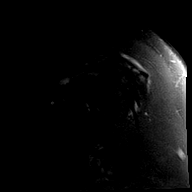
[im 11/18]
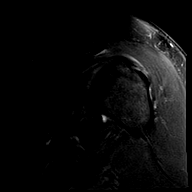
[im 14/18]
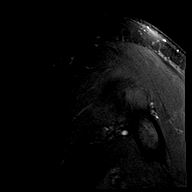
[im 18/18]
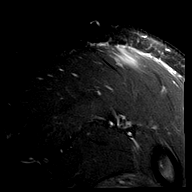

[Series 8: PD fat-sat · oblique · 4.0mm · 0.36mm/px · 6 of 18 slices shown (2 of 2)]
[im 1/18]
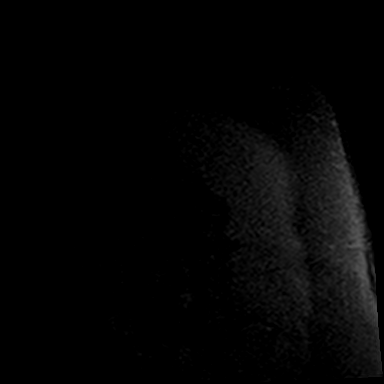
[im 4/18]
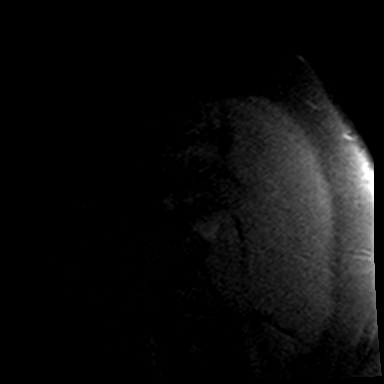
[im 7/18]
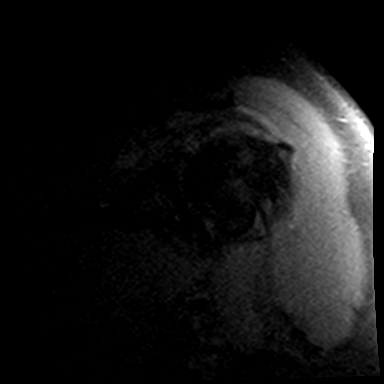
[im 11/18]
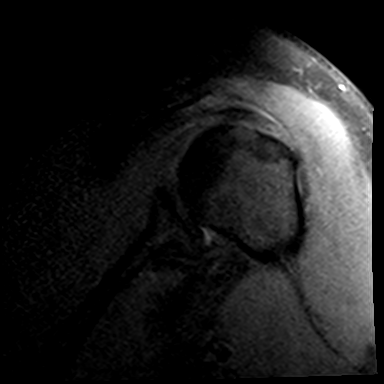
[im 14/18]
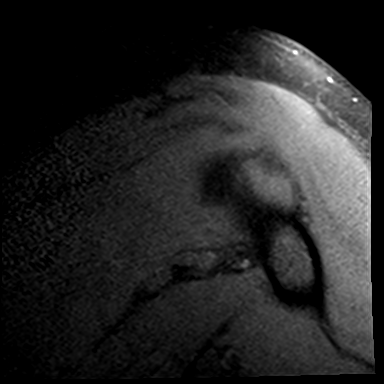
[im 18/18]
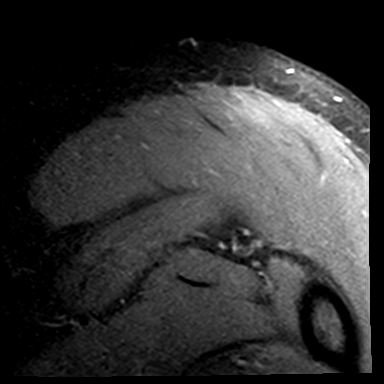

[Series 9: T2 fat-sat · oblique · 4.0mm · 0.73mm/px · 7 of 20 slices shown (3 of 3)]
[im 1/20]
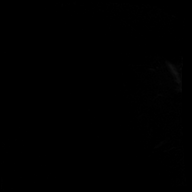
[im 4/20]
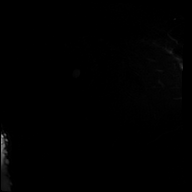
[im 7/20]
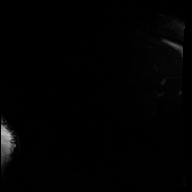
[im 10/20]
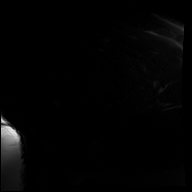
[im 13/20]
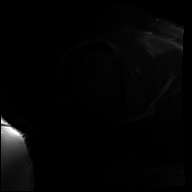
[im 16/20]
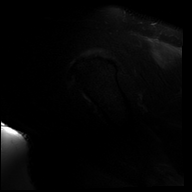
[im 20/20]
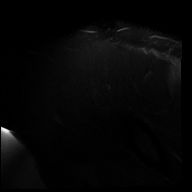

[40 of 40 positions shown; findings below may reference images not displayed]

FINDINGS: Rotator cuff: The patient has rotator cuff tendinopathy. There is
thinning of the posterior fibers of the supraspinatus measuring
approximately 0.5 cm from front to back consistent with a small
undersurface tear. There is minimal retraction.

Muscles: A cyst in the supraspinatus near the musculotendinous
junction measures 0.9 cm transverse by 0.5 cm craniocaudal by 0.5 cm
AP and is compatible with a ganglion.

Biceps long head:  Intact.

Acromioclavicular Joint: Moderate osteoarthritis. Type 2 acromion.
There is some fluid in the subacromial/subdeltoid bursa.

Glenohumeral Joint: Mild degenerative change is present with
cartilage thinning and a small focus of subchondral edema in the
inferior glenoid.

Labrum:  Appears intact.

Bones:  No fracture or worrisome lesion.

Other: None.
IMPRESSION: Rotator cuff tendinopathy with a small undersurface tear of the
posterior supraspinatus. No retraction or atrophy.

Moderate acromioclavicular and mild glenohumeral osteoarthritis.

Subacromial/subdeltoid bursitis.

## 2019-11-13 ENCOUNTER — Other Ambulatory Visit: Payer: Self-pay | Admitting: Orthopedic Surgery

## 2019-11-13 DIAGNOSIS — M25572 Pain in left ankle and joints of left foot: Secondary | ICD-10-CM

## 2019-11-13 DIAGNOSIS — M542 Cervicalgia: Secondary | ICD-10-CM

## 2019-11-13 DIAGNOSIS — M545 Low back pain, unspecified: Secondary | ICD-10-CM

## 2019-11-13 DIAGNOSIS — M25512 Pain in left shoulder: Secondary | ICD-10-CM

## 2019-11-13 DIAGNOSIS — M25562 Pain in left knee: Secondary | ICD-10-CM

## 2019-11-20 ENCOUNTER — Ambulatory Visit
Admission: RE | Admit: 2019-11-20 | Discharge: 2019-11-20 | Disposition: A | Discharge status: Home / Self Care | Payer: No Typology Code available for payment source | Source: Ambulatory Visit | Attending: Orthopedic Surgery | Admitting: Orthopedic Surgery

## 2019-11-20 ENCOUNTER — Other Ambulatory Visit: Payer: Self-pay

## 2019-11-20 DIAGNOSIS — M25562 Pain in left knee: Secondary | ICD-10-CM

## 2019-11-20 DIAGNOSIS — M542 Cervicalgia: Secondary | ICD-10-CM

## 2019-11-20 DIAGNOSIS — M25572 Pain in left ankle and joints of left foot: Secondary | ICD-10-CM

## 2019-11-20 DIAGNOSIS — M545 Low back pain, unspecified: Secondary | ICD-10-CM

## 2019-11-20 DIAGNOSIS — M25512 Pain in left shoulder: Secondary | ICD-10-CM
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
## 2019-11-29 ENCOUNTER — Other Ambulatory Visit: Payer: Self-pay | Admitting: Orthopedic Surgery

## 2019-11-29 DIAGNOSIS — M79672 Pain in left foot: Secondary | ICD-10-CM

## 2019-12-02 ENCOUNTER — Other Ambulatory Visit: Payer: Self-pay

## 2019-12-02 ENCOUNTER — Ambulatory Visit
Admission: RE | Admit: 2019-12-02 | Discharge: 2019-12-02 | Disposition: A | Discharge status: Home / Self Care | Payer: No Typology Code available for payment source | Source: Ambulatory Visit | Attending: Orthopedic Surgery | Admitting: Orthopedic Surgery

## 2019-12-02 DIAGNOSIS — M79672 Pain in left foot: Secondary | ICD-10-CM

## 2019-12-02 IMAGING — MR MR FOOT*L* W/O CM
5 series · 40 of 40 positions shown · non-contrast
Comparison: [DATE] radiograph

CLINICAL DATA: Continued ankle pain.

EXAM:
MRI OF THE LEFT HINDFOOT WITHOUT CONTRAST
TECHNIQUE: Multiplanar, multisequence MR imaging of the ankle was performed. No
intravenous contrast was administered.

[Series 4: T2 fat-sat · axial · 3.0mm · 0.53mm/px · z∈[-90,+77]mm · 11 of 43 slices shown]
[im 1/43]
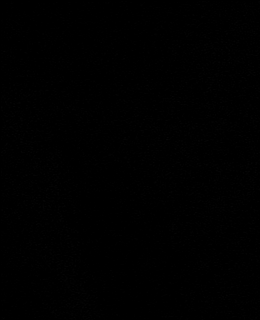
[im 5/43]
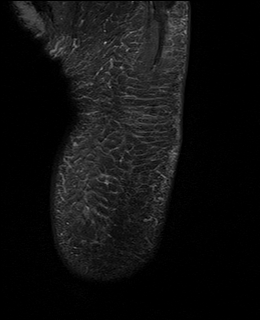
[im 9/43]
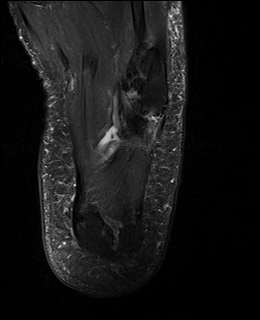
[im 13/43]
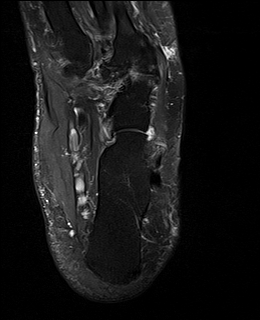
[im 17/43]
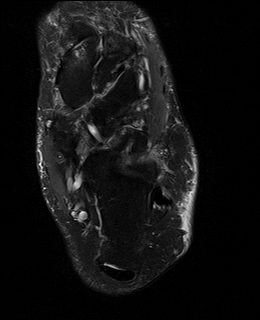
[im 22/43]
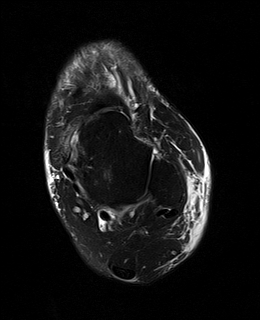
[im 26/43]
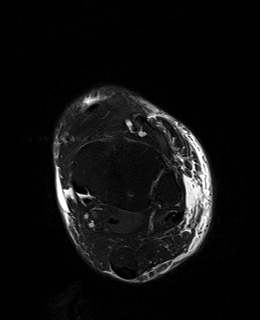
[im 30/43]
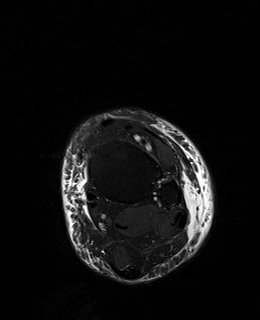
[im 34/43]
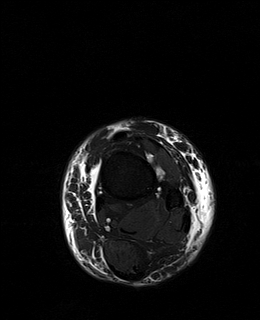
[im 38/43]
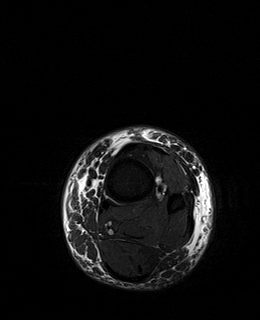
[im 43/43]
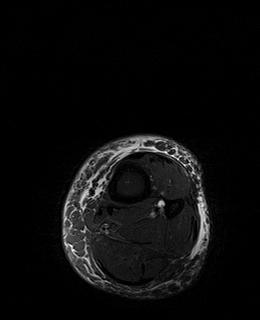

[Series 5: PD fat-sat · axial · 3.0mm · 0.56mm/px · z∈[-90,+76]mm · 10 of 43 slices shown]
[im 1/43]
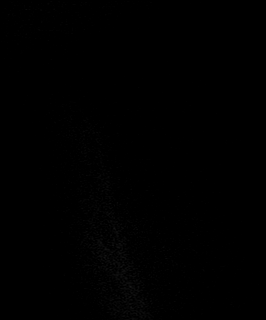
[im 5/43]
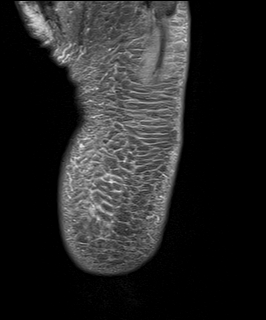
[im 10/43]
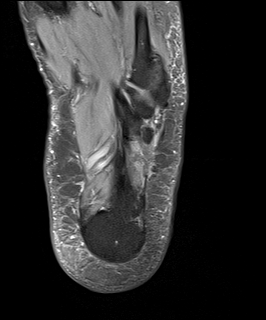
[im 15/43]
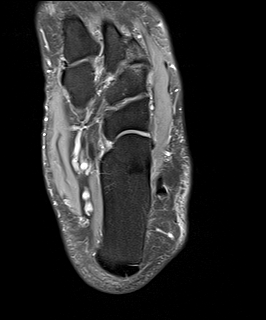
[im 19/43]
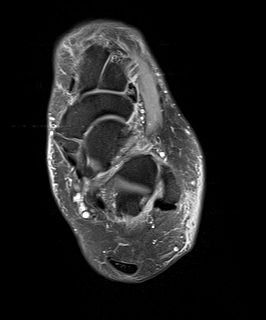
[im 24/43]
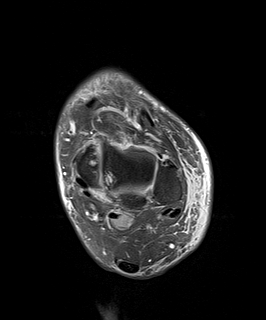
[im 29/43]
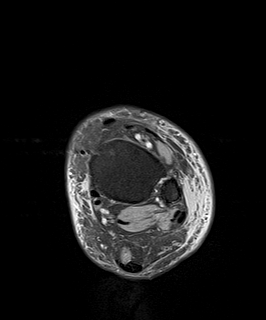
[im 33/43]
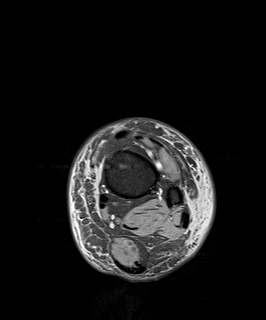
[im 38/43]
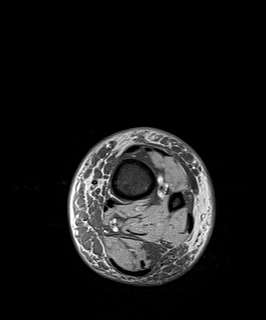
[im 43/43]
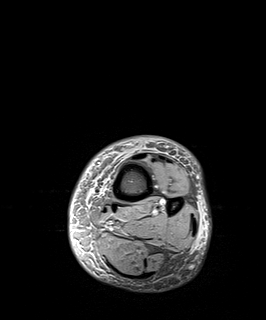

[Series 6: T1 · sagittal · 4.0mm · 0.56mm/px · 5 of 22 slices shown]
[im 1/22]
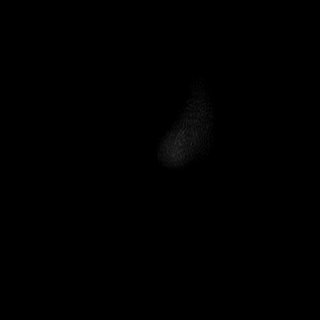
[im 6/22]
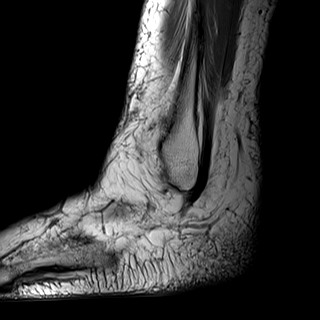
[im 11/22]
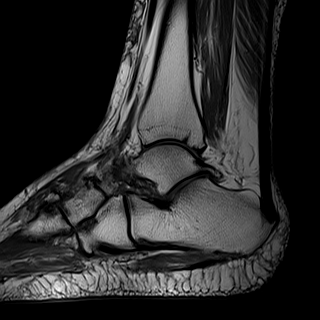
[im 16/22]
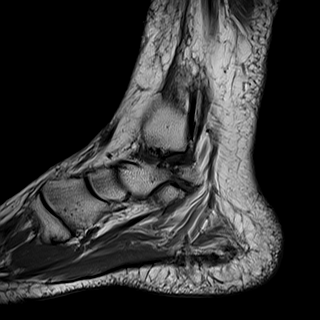
[im 22/22]
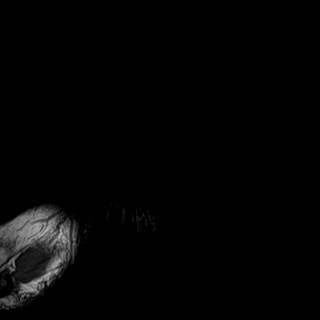

[Series 7: STIR · sagittal · 4.0mm · 0.70mm/px · 5 of 22 slices shown (1 of 2)]
[im 1/22]
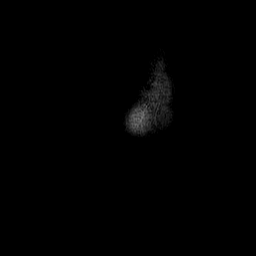
[im 6/22]
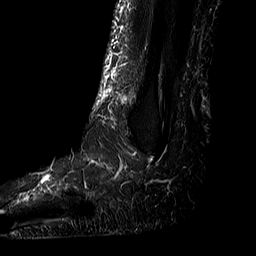
[im 11/22]
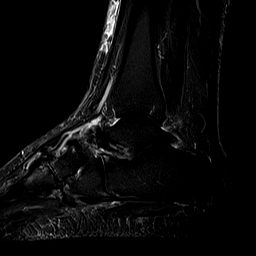
[im 16/22]
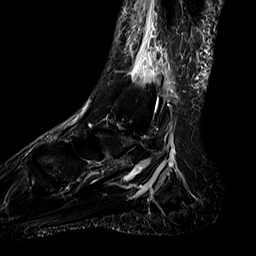
[im 22/22]
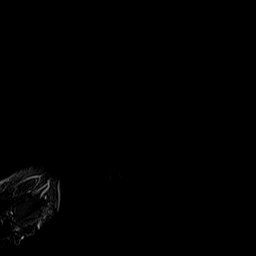

[Series 8: STIR · coronal · 4.0mm · 0.70mm/px · 9 of 37 slices shown (2 of 2)]
[im 1/37]
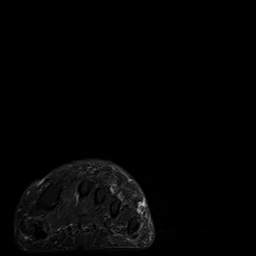
[im 5/37]
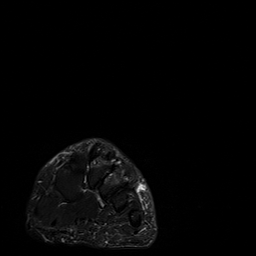
[im 10/37]
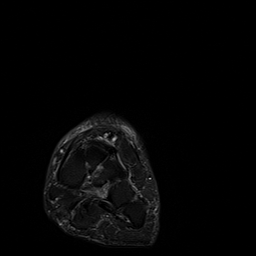
[im 14/37]
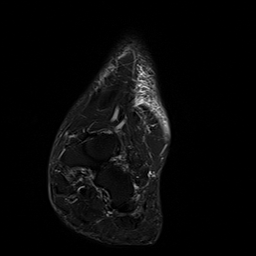
[im 19/37]
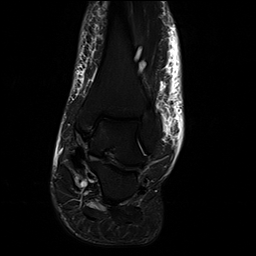
[im 23/37]
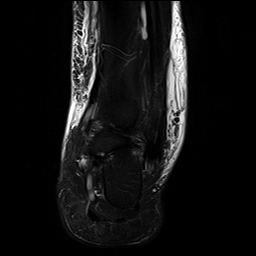
[im 28/37]
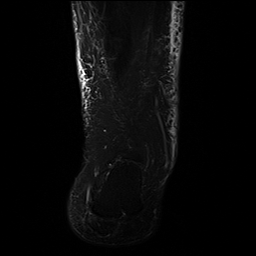
[im 32/37]
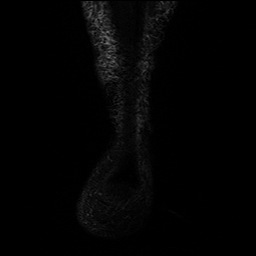
[im 37/37]
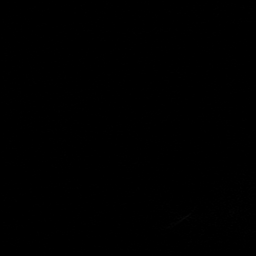

[40 of 40 positions shown; findings below may reference images not displayed]

FINDINGS: TENDONS

Peroneal: Os peroneus noted.

Posteromedial: Minimal distal tibialis posterior tendinopathy. Type
2 accessory navicular without edema in the ossicle or along the
synchondrosis.

Anterior: Unremarkable

Achilles: Unremarkable

Plantar Fascia: Plantar calcaneal spur noted with borderline
thickening of the medial band of the plantar fascia proximally.

LIGAMENTS

Lateral: Unremarkable

Medial: Unremarkable

CARTILAGE

Ankle Joint: 0.4 by 1.2 cm non-fragmented osteochondral lesion of
the medial talar dome with subcortical cyst formation, mild cortical
irregularity, and mild surrounding subcortical marrow edema.

Subtalar Joints/Sinus Tarsi: Mild degenerative findings including
spurring.

Bones: Spurring and mild subcortical marrow edema along the Lisfranc
joint. Minimal marrow edema along the tip of the anterior process of
the calcaneus appears degenerative.

Other: Subcutaneous edema along the distal calf and malleoli.
IMPRESSION: 1. Small non-fragmented osteochondral lesion of the medial talar
dome with subcortical cyst formation, cortical irregularity, and
mild surrounding subcortical marrow edema.
2. Mild degenerative findings along the Lisfranc joint, subtalar
joint, and in the midfoot.
3. Minimal distal tibialis posterior tendinopathy.
4. Type 2 accessory navicular without edema in the ossicle or along
the synchondrosis.
5. Subcutaneous edema along the distal calf and malleoli.
6. Plantar calcaneal spur with borderline thickening of the medial
band of the plantar fascia proximally.

## 2021-03-21 ENCOUNTER — Encounter: Payer: No Typology Code available for payment source | Admitting: Internal Medicine

## 2021-03-25 ENCOUNTER — Other Ambulatory Visit: Payer: Self-pay

## 2021-03-26 ENCOUNTER — Ambulatory Visit (INDEPENDENT_AMBULATORY_CARE_PROVIDER_SITE_OTHER): Payer: 59 | Admitting: Internal Medicine

## 2021-03-26 ENCOUNTER — Encounter: Payer: Self-pay | Admitting: Internal Medicine

## 2021-03-26 VITALS — BP 120/84 | Temp 98.1°F | Ht 65.0 in | Wt 324.6 lb

## 2021-03-26 DIAGNOSIS — E785 Hyperlipidemia, unspecified: Secondary | ICD-10-CM

## 2021-03-26 DIAGNOSIS — G4733 Obstructive sleep apnea (adult) (pediatric): Secondary | ICD-10-CM

## 2021-03-26 DIAGNOSIS — I10 Essential (primary) hypertension: Secondary | ICD-10-CM | POA: Diagnosis not present

## 2021-03-26 DIAGNOSIS — M25561 Pain in right knee: Secondary | ICD-10-CM

## 2021-03-26 DIAGNOSIS — G8929 Other chronic pain: Secondary | ICD-10-CM

## 2021-03-26 NOTE — Progress Notes (Signed)
Established Patient Office Visit     This visit occurred during the SARS-CoV-2 public health emergency.  Safety protocols were in place, including screening questions prior to the visit, additional usage of staff PPE, and extensive cleaning of exam room while observing appropriate contact time as indicated for disinfecting solutions.    CC/Reason for Visit: Reestablish care, follow-up chronic medical conditions  HPI: Danielle Shaw is a 50 y.o. female who is coming in today for the above mentioned reasons. Past Medical History is significant for: Morbid obesity with a BMI above 54, obstructive sleep apnea, hypertension, hyperlipidemia, insulin resistance.  I have not seen her since January 2020.  Because of insurance she had established care with another provider but is now wanting to return.  She has several acute issues to discuss today: She would like to reestablish with Dr. Brett Fairy with [facility] neurology.  She has sleep apnea and has not been wearing her CPAP as she does not have equipment.  She is also requesting to be seen again at weight and wellness.  Since she saw them 2 years ago, I have advised that she call the clinic to see if she needs a referral.  If she does I would be happy to do so.  She has also been complaining of right knee pain that is chronic at this point.  Pain is right above the patella.  It hurts more to go down steps and up steps.  She states she had an x-ray but not an MRI.   Past Medical/Surgical History: Past Medical History:  Diagnosis Date   Arrhythmia    Arthritis    GERD (gastroesophageal reflux disease)    Hyperlipidemia    Hypertension    IUD 2004   IUD 2009   Migraine    SOB (shortness of breath)     Past Surgical History:  Procedure Laterality Date   BREAST BIOPSY Right 2011   BREAST SURGERY  2000   reduction   CESAREAN SECTION     COLONOSCOPY WITH PROPOFOL N/A 07/11/2018   Procedure: COLONOSCOPY WITH PROPOFOL;  Surgeon: Thornton Park, MD;  Location: [facility];  Service: Gastroenterology;  Laterality: N/A;    Social History:  reports that she has never smoked. She has never used smokeless tobacco. She reports that she does not drink alcohol and does not use drugs.  Allergies: Allergies  Allergen Reactions   Latex    Lisinopril Cough   Metoprolol Other (See Comments)    Funny feeling    Family History:  Family History  Problem Relation Age of Onset   Heart disease Mother 39   High Cholesterol Mother    High blood pressure Mother    Diabetes Father    Heart disease Father    High blood pressure Father    High Cholesterol Father    Heart disease Maternal Grandmother    Hodgkin's lymphoma Daughter      Current Outpatient Medications:    albuterol (PROVENTIL HFA;VENTOLIN HFA) 108 (90 Base) MCG/ACT inhaler, INHALE TWO PUFFS BY MOUTH EVERY 6 HOURS AS NEEDED FOR WHEEZING OR SHORTNESS OF BREATH (Patient taking differently: Inhale 2 puffs into the lungs every 6 (six) hours as needed for wheezing or shortness of breath. INHALE TWO PUFFS BY MOUTH EVERY 6 HOURS AS NEEDED FOR WHEEZING OR SHORTNESS OF BREATH), Disp: 18 each, Rfl: 1   cyanocobalamin (,VITAMIN B-12,) 1000 MCG/ML injection, Inject 1,000 mcg into the muscle once., Disp: , Rfl:    cyclobenzaprine (  FLEXERIL) 5 MG tablet, Take 1 tablet (5 mg total) by mouth 3 (three) times daily as needed for muscle spasms., Disp: 30 tablet, Rfl: 1   gabapentin (NEURONTIN) 100 MG capsule, Take 100 mg by mouth 3 (three) times daily., Disp: , Rfl:    HYDROcodone-acetaminophen (NORCO/VICODIN) 5-325 MG tablet, Take 1 tablet by mouth 2 (two) times daily as needed., Disp: , Rfl:    levocetirizine (XYZAL) 5 MG tablet, Take 5 mg by mouth every evening., Disp: , Rfl:    losartan (COZAAR) 100 MG tablet, Take 1 tablet (100 mg total) by mouth daily., Disp: 90 tablet, Rfl: 3   omeprazole (PRILOSEC) 20 MG capsule, Take 20 mg by mouth daily., Disp: , Rfl:    rosuvastatin (CRESTOR)  20 MG tablet, Take 1 tablet (20 mg total) by mouth daily., Disp: 90 tablet, Rfl: 1   spironolactone (ALDACTONE) 25 MG tablet, Take 25 mg by mouth daily., Disp: , Rfl:    Vitamin D, Ergocalciferol, (DRISDOL) 1.25 MG (50000 UT) CAPS capsule, Take 1 capsule (50,000 Units total) by mouth every 7 (seven) days., Disp: 4 capsule, Rfl: 0   misoprostol (CYTOTEC) 200 MCG tablet, Take 4 tablets (800 mcg total) by mouth once for 1 dose. Take at bedtime the night before the IUD removal is scheduled., Disp: 4 tablet, Rfl: 0   ondansetron (ZOFRAN) 4 MG tablet, Take 1 tablet (4 mg total) by mouth every 8 (eight) hours as needed for nausea or vomiting., Disp: 50 tablet, Rfl: 0   pantoprazole (PROTONIX) 40 MG tablet, Take 1 tablet (40 mg total) by mouth daily., Disp: 90 tablet, Rfl: 3  Review of Systems:  Constitutional: Denies fever, chills, diaphoresis, appetite change and fatigue.  HEENT: Denies photophobia, eye pain, redness, hearing loss, ear pain, congestion, sore throat, rhinorrhea, sneezing, mouth sores, trouble swallowing, neck pain, neck stiffness and tinnitus.   Respiratory: Denies  cough, chest tightness,  and wheezing.   Cardiovascular: Denies chest pain, palpitations and leg swelling.  Gastrointestinal: Denies nausea, vomiting, abdominal pain, diarrhea, constipation, blood in stool and abdominal distention.  Genitourinary: Denies dysuria, urgency, frequency, hematuria, flank pain and difficulty urinating.  Endocrine: Denies: hot or cold intolerance, sweats, changes in hair or nails, polyuria, polydipsia. Musculoskeletal: Positive for myalgias, back pain, joint swelling, arthralgias and gait problem.  Skin: Denies pallor, rash and wound.  Neurological: Denies dizziness, seizures, syncope, weakness, light-headedness, numbness and headaches.  Hematological: Denies adenopathy. Easy bruising, personal or family bleeding history  Psychiatric/Behavioral: Denies suicidal ideation, mood changes, confusion,  nervousness, sleep disturbance and agitation    Physical Exam: Vitals:   03/26/21 1511  BP: 120/84  Temp: 98.1 F (36.7 C)  TempSrc: Oral  Weight: (!) 324 lb 9.6 oz (147.2 kg)  Height: '5\' 5"'$  (1.651 m)    Body mass index is 54.02 kg/m.    Constitutional: NAD, calm, comfortable, obese Eyes: PERRL, lids and conjunctivae normal ENMT: Mucous membranes are moist.  Respiratory: clear to auscultation bilaterally, no wheezing, no crackles. Normal respiratory effort. No accessory muscle use.  Cardiovascular: Regular rate and rhythm, no murmurs / rubs / gallops.  Trace bilateral lower extremity edema. Psychiatric: Normal judgment and insight. Alert and oriented x 3. Normal mood.    Impression and Plan:  OSA (obstructive sleep apnea)  - Plan: Ambulatory referral to Neurology/sleep medicine  Chronic pain of right knee -By description and exam sounds like possibly patellofemoral syndrome in which case treatment would consist of bracing, quadriceps strengthening. -I will refer her to sports medicine for  further evaluation.  HYPERTENSION, BENIGN SYSTEMIC -Well-controlled on losartan.  Hyperlipidemia, unspecified hyperlipidemia type -She is on rosuvastatin 20 mg daily. -Recheck lipids fasting next visit.  Morbid obesity ([place]) -Discussed healthy lifestyle, including increased physical activity and better food choices to promote weight loss. -She is requesting to reestablish with weight and wellness.  Time spent: 34 minutes reviewing chart, interviewing and examining patient and formulating plan of care.    Lelon Frohlich, MD [facility]

## 2021-04-01 ENCOUNTER — Ambulatory Visit: Payer: Self-pay

## 2021-04-01 ENCOUNTER — Encounter: Payer: Self-pay | Admitting: Family Medicine

## 2021-04-01 ENCOUNTER — Ambulatory Visit (INDEPENDENT_AMBULATORY_CARE_PROVIDER_SITE_OTHER): Payer: 59 | Admitting: Family Medicine

## 2021-04-01 ENCOUNTER — Other Ambulatory Visit: Payer: Self-pay

## 2021-04-01 VITALS — BP 140/88 | Ht 65.0 in | Wt 325.8 lb

## 2021-04-01 DIAGNOSIS — M76892 Other specified enthesopathies of left lower limb, excluding foot: Secondary | ICD-10-CM

## 2021-04-01 DIAGNOSIS — M7072 Other bursitis of hip, left hip: Secondary | ICD-10-CM | POA: Diagnosis not present

## 2021-04-01 DIAGNOSIS — M25561 Pain in right knee: Secondary | ICD-10-CM

## 2021-04-01 NOTE — Patient Instructions (Addendum)
Good to see you today.    You had a R knee injection. Call or go to the ER if you develop a large red swollen joint with extreme pain or oozing puss.   I've referred you to Physical Therapy.  Let us know if you don't hear from them in one week.   Work on the [employer] [facility] [employer] and wellness.   Recheck with me in 6 weeks.   Let me know sooner if this is not work.

## 2021-04-01 NOTE — Progress Notes (Signed)
I, Danielle Shaw, LAT, ATC, am serving as scribe for Dr. Lynne Leader.  Subjective:    CC: R knee pain  HPI: Pt is a 50 y/o female c/o R knee pain since late May 2022 w/ no known MOI . Of note, pt was seen at the [facility] on 12/30/20 and [place] on 01/06/21 for this issue and was given a R knee steroid injection. Today, pt locates pain to her entire R knee.  She describes her pain as a constant nagging pain  R knee swelling: yes Radiates: yes into her ant thigh and lower leg Mechanical symptoms: yes Aggravates: squatting; walking; prolonged standing; crossing R ankle over L knee; prolonged positioning of any type Treatments tried: IBU; hydrocodone-acetaminophen; ice; elevation  She also reports B ischial tuberosity pain,  L>R, x 4-6 weeks.  Dx imaging: 01/06/21 Bilat knee XR (@ [facility]) 12/24/16 R knee XR  05/04/13 R knee XR  Pertinent review of Systems: No fevers or chills  Relevant historical information: Obesity.  Scheduled to start [place] healthy weight and wellness this week.   Objective:    Vitals:   04/01/21 1528  BP: 140/88   General: Well Developed, well nourished, and in no acute distress.   MSK: Right knee mild effusion normal motion with crepitation. Tender palpation medial joint line and lateral joint line. Stable ligamentous exam. Intact strength.  Left hip normal-appearing Tender palpation posterior hip and ischial tuberosity.  Pain with resisted hip extension.  Pain with knee flexion.  Lab and Radiology Results Procedure: Real-time Ultrasound Guided Injection of right knee superior lateral patellar space Device: Philips Affiniti 50G Images permanently stored and available for review in PACS Verbal informed consent obtained.  Discussed risks and benefits of procedure. Warned about infection bleeding damage to structures skin hypopigmentation and fat atrophy among  others. Patient expresses understanding and agreement Time-out conducted.   Noted no overlying erythema, induration, or other signs of local infection.   Skin prepped in a sterile fashion.   Local anesthesia: Topical Ethyl chloride.   With sterile technique and under real time ultrasound guidance: 40 mg of Kenalog and 2 mL of Marcaine injected into knee joint. Fluid seen entering the joint capsule.   Completed without difficulty   Pain immediately resolved suggesting accurate placement of the medication.   Advised to call if fevers/chills, erythema, induration, drainage, or persistent bleeding.   Images permanently stored and available for review in the ultrasound unit.  Impression: Technically successful ultrasound guided injection.    X-ray report [name]  ORTHO XR KNEE 1 OR 2 VW UNILATERAL, 01/06/21   Order Questions Answers  Reason for exam: pain  Laterality: Right  View(s) AP,Lateral   FINDINGS:   AP bilateral knees as well as lateral sunrise right knee reviewed  demonstrates no acute fractures medial and lateral degenerative changes of  the bilateral knees with joint space narrowing sclerosis early osteophyte  formation medial joint spaces worse on the right and lateral joint spaces  of the left knee. Mild degenerative changes of patellofemoral joint right  knee superior patellar enthesophyte posterior osteophyte versus loose  body. Tricompartmental degenerative changes of the right knee.   Electronically signed by: Raynald Kemp, PA-C 01/06/2021 4:08 PM   Impression and Recommendations:    Assessment and Plan: 50 y.o. 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  Plan for steroid injection, and Voltaren gel.  Additionally refer  to physical therapy for quad strengthening which should provide some benefit.  Most importantly she is working on weight loss which will be very important for long-term management of her knee pain.  We will also work on  authorization for hyaluronic acid injections in case the steroid injection does not provide lasting benefit.  Spent some time talking about the ultimate progression of her knee arthritis.  Ultimately her knee is going to wear out and she will very likely require knee replacement at some point.  Unfortunately for this to occur her BMI needs to be under 40 and currently is 54.2.  She already has been referred to [facility] healthy weight and wellness and has an appointment starting this week.  Posterior left hip pain due to tenderness and pain at ischial tuberosity thought to be related to hamstring tendinitis or ischial bursitis.  Again physical therapy will be the chief we get this problem better.  Plan refer to PT and recheck in 6 weeks.  Recheck with me in about 6 weeks.  Return sooner if needed.[name]  PDMP not reviewed this encounter. Orders Placed This Encounter  Procedures   [place] LIMITED JOINT SPACE STRUCTURES LOW RIGHT(NO LINKED CHARGES)    Order Specific Question:   Reason for Exam (SYMPTOM  OR DIAGNOSIS REQUIRED)    Answer:   R knee pain    Order Specific Question:   Preferred imaging location?    Answer:   [place]   Ambulatory referral to Physical Therapy    Referral Priority:   Routine    Referral Type:   Physical Medicine    Referral Reason:   Specialty Services Required    Requested Specialty:   Physical Therapy    Number of Visits Requested:   1   No orders of the defined types were placed in this encounter.   Discussed warning signs or symptoms. Please see discharge instructions. Patient expresses understanding.   The above documentation has been reviewed and is accurate and complete Lynne Leader, M.D.

## 2021-04-02 DIAGNOSIS — Z0289 Encounter for other administrative examinations: Secondary | ICD-10-CM

## 2021-04-07 ENCOUNTER — Other Ambulatory Visit: Payer: Self-pay

## 2021-04-07 ENCOUNTER — Ambulatory Visit (HOSPITAL_BASED_OUTPATIENT_CLINIC_OR_DEPARTMENT_OTHER): Payer: 59 | Attending: Family Medicine | Admitting: Physical Therapy

## 2021-04-07 ENCOUNTER — Encounter (HOSPITAL_BASED_OUTPATIENT_CLINIC_OR_DEPARTMENT_OTHER): Payer: Self-pay | Admitting: Physical Therapy

## 2021-04-07 DIAGNOSIS — R262 Difficulty in walking, not elsewhere classified: Secondary | ICD-10-CM | POA: Diagnosis present

## 2021-04-07 DIAGNOSIS — M25561 Pain in right knee: Secondary | ICD-10-CM | POA: Insufficient documentation

## 2021-04-07 DIAGNOSIS — M6281 Muscle weakness (generalized): Secondary | ICD-10-CM | POA: Diagnosis present

## 2021-04-07 NOTE — Therapy (Addendum)
OUTPATIENT PHYSICAL THERAPY LOWER EXTREMITY EVALUATION   Patient Name: Danielle Shaw MRN: [id number] DOB:08/24/1970, 50 y.o., female Today's Date: 04/07/2021  PCP: Isaac Bliss, Rayford Halsted, MD REFERRING PROVIDER: Gregor Hams, MD   PT End of Session - 04/07/21 1522     Visit Number 1    Number of Visits 13    Date for PT Re-Evaluation 07/06/21    Authorization Type UHC    PT Start Time 1522    PT Stop Time [id number]    PT Time Calculation (min) 48 min             Past Medical History:  Diagnosis Date   Arrhythmia    Arthritis    GERD (gastroesophageal reflux disease)    Hyperlipidemia    Hypertension    IUD 2004   IUD 2009   Migraine    SOB (shortness of breath)    Past Surgical History:  Procedure Laterality Date   BREAST BIOPSY Right 2011   BREAST SURGERY  2000   reduction   CESAREAN SECTION     COLONOSCOPY WITH PROPOFOL N/A 07/11/2018   Procedure: COLONOSCOPY WITH PROPOFOL;  Surgeon: Thornton Park, MD;  Location: [facility];  Service: Gastroenterology;  Laterality: N/A;   Patient Active Problem List   Diagnosis Date Noted   Neck pain on left side 05/19/2018   Blood in stool 05/19/2018   Circadian rhythm sleep disorder, shift work type 03/30/2018   Obstructive sleep apnea hypopnea, severe 03/30/2018   Carpal tunnel syndrome on both sides 03/30/2018   SI (sacroiliac) joint dysfunction 01/17/2018   Left flank pain 01/07/2018   Sleep paralysis, recurrent isolated 10/20/2017   Complaint related to dreams 10/20/2017   Arthralgia 12/26/2016   Allergic rhinitis 05/03/2015   Right knee pain 04/27/2013   Annual physical exam 07/15/2012   Presence of intrauterine contraceptive device (IUD)    Morbid obesity ([place]) 03/07/2007   Hyperlipidemia 09/23/2006   MIGRAINE, UNSPEC., W/O INTRACTABLE MIGRAINE 09/23/2006   HYPERTENSION, BENIGN SYSTEMIC 09/23/2006    ONSET DATE: May 2022  REFERRING DIAG: M25.561 (ICD-10-CM) - Acute pain of right knee M70.72  (ICD-10-CM) - Ischial bursitis of left side M76.892 (ICD-10-CM) - Hamstring tendinitis of left thigh   THERAPY DIAG:  Pain in joint of right knee  Muscle weakness (generalized)  Difficulty walking  SUBJECTIVE:   SUBJECTIVE STATEMENT: Pt reports that  that the knee started hurting about late April/May. She was getting ready for her daughter's graduation and starting having a aching pain to a "pull pain." Pulling was on the inside and back part of the knee. 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 After the morning, she is sitting at her desk for the rest of the day.  She does not remember a specific MOI. She feels like it is sticking in in the leg. When she turns a certain way, it will pop and occasionally feels like it locks.  She feels the pain is deeper inside. She feels pulling into the front of the thigh.  Pt states she has pain that increases at night with rolling in bed. Pt denies unexplained weight loss. Pt states the feels tight with intermittent swelling. Pt denies feelings of instability but feels like the knee might "buckle" or lose her balance after walking for a while at work.  Pt  states the L hips like it might pop when moving. Pt states she feels that pain in the sitting bone on the L side. Pt states it hurts to touch on the L ischial tuberosity. Ischial tuberosity pain onset L x 4-6 weeks. Pt denies systemic symptoms.                                                                                                                                                                                                   PERTINENT HISTORY:  [facility] on 12/30/20 and [facility] on 01/06/21 for this issue and was given a R knee steroid injection;  PAIN:  Are you having pain? Yes VAS scale: 7/10 Pain  location: anterior, above patella of R knee; L hip posterior pain Pain orientation: Right and Posterior  PAIN TYPE: aching, sore, "ticking," sharp Pain description: intermittent, sharp, and stabbing  Aggravating factors: getting in and out of bed, stairs, walking, squatting, sitting for too long, cold environments Relieving factors: resting, massage, ice, heat   PRECAUTIONS: None  WEIGHT BEARING RESTRICTIONS No  FALLS: Has patient fallen in last 6 months? No,   LIVING ENVIRONMENT: Lives with: lives with their family and lives alone Lives in: House/apartment Stairs: Yes; External Has following equipment at home: Crutches does not use  PLOF: Independent  PATIENT GOALS: Pt states she would like to be able to play with grandkids without limitation. She would like to go up and stairs without pain.    OBJECTIVE:   DIAGNOSTIC FINDINGS: FINDINGS:   AP bilateral knees as well as lateral sunrise right knee reviewed  demonstrates no acute fractures medial and lateral degenerative changes of  the bilateral knees with joint space narrowing sclerosis early osteophyte  formation medial joint spaces worse on the right and lateral joint spaces  of the left knee. Mild degenerative changes of patellofemoral joint right  knee superior patellar enthesophyte posterior osteophyte versus loose  body. Tricompartmental degenerative changes of the right knee.   PATIENT SURVEYS:  LEFS 27 / 80 = 33.8 %  COGNITION:  Overall cognitive status: Within functional limits for tasks assessed     MUSCLE LENGTH: Hamstrings: bilat limited in supine 90/90 Triceps surae: limited DF with squatting and CKC positions     LE AROM/PROM:  AROM Right 04/07/2021 Left 04/07/2021              Hip internal rotation 25 15  Hip external rotation 35 30  Knee flexion 106 126  Knee extension [facility]  Ankle dorsiflexion 0 0  Ankle plantarflexion [facility]  WFL           (Blank rows = not tested)  Pt unable to  tolerate PROM due to guarding and apprehension following increased pain with functional movement testing.   LE MMT:  MMT Right 04/07/2021 Left 04/07/2021      Hip abduction 4/5 4/5 p!  Hip adduction 4/5 4/5  Hip internal rotation    Hip external rotation 4/5 4/5 p!  Knee flexion 4/5 p! 4+/5  Knee extension 4/5p! 4+/5                   (Blank rows = not tested)  LOWER EXTREMITY SPECIAL TESTS: Tests performed on L hip and R knee Hip special tests: Saralyn Pilar (FABER) test: positive , Trendelenburg test: positive , Ely's test: positive , Hip scouring test: negative, Anterior hip impingement test: positive , and Piriformis test: positive  and Knee special tests: Anterior drawer test: negative, Lachman Test: negative, and McMurray's test: positive  joint line tendnerness positive on R  JOINT MOBILITY ASSESSMENT:  Difficult to assess due to body habitus and muscle guarding  FUNCTIONAL TESTS:  5 times sit to stand: 24.3s with pain. Stopped at 3 due to pain  GAIT: Distance walked: 50 Assistive device utilized: None Level of assistance: Complete Independence Comments: antalgic, Trendelenburg    Today's Treatment:   Exercises Hooklying Single Knee to Chest Stretch - 3 reps - 30 hold Supine Quad Set  2 sets - 10 reps - 5 hold Supine Bridge -  sets - 10 reps - 2 hold Seated Ankle Dorsiflexion Stretch - 2 1 sets - 3 reps - 20 hold    PATIENT EDUCATION:  Education details: MOI, diagnosis, prognosis, anatomy, exercise progression, DOMS expectations, muscle firing, HEP, POC Person educated: Patient Education method: Explanation, Demonstration, Tactile cues, Verbal cues, and Handouts Education comprehension: verbalized understanding, returned demonstration, verbal cues required, and tactile cues required   HOME EXERCISE PROGRAM: Access Code: [email or web address] URL: [email or web address] Date: 04/07/2021 Prepared by: Daleen Bo  Exercises Hooklying Single Knee to Chest Stretch  - 2 x daily - 7 x weekly - 1 sets - 3 reps - 30 hold Supine Quad Set - 2 x daily - 7 x weekly - 2 sets - 10 reps - 5 hold Supine Bridge - 2 x daily - 7 x weekly - 1 sets - 10 reps - 2 hold Seated Ankle Dorsiflexion Stretch - 2 x daily - 7 x weekly - 1 sets - 3 reps - 20 hold   ASSESSMENT:  CLINICAL IMPRESSION: Patient is a 50 y.o. female who was seen today for physical therapy evaluation and treatment for CC of R knee pain and L hip/glute pain. Pt's s/s appear consistent with history of R knee OA and internal derangement as well as L hip gluteal/hip rotator irritation. L hip/glute pain likely due to chronic off weighting and R LE in conjuction with Trendelenburg gait with large body habitus. Pt's pain is moderately painful and irritable. Objective impairments include Abnormal gait, decreased activity tolerance, decreased balance, decreased endurance, decreased mobility, difficulty walking, decreased ROM, decreased strength, hypomobility, increased muscle spasms, impaired flexibility, improper body mechanics, postural dysfunction, obesity, and pain. These impairments are limiting patient from cleaning, community activity, occupation, shopping, and cooking . Personal factors including Age, Behavior pattern, Profession, Time since onset of injury/illness/exacerbation, 1 comorbidity: obesity, are also affecting patient's functional outcome. Patient will benefit from skilled PT to address above impairments and improve overall function.  REHAB POTENTIAL: Good  CLINICAL DECISION MAKING:  Stable/uncomplicated  EVALUATION COMPLEXITY: Low   GOALS:   SHORT TERM GOALS:  STG Name Target Date Goal status  1 Pt will become independent with HEP in order to demonstrate synthesis of PT education.  04/21/2021  INITIAL  2 Pt will have an at least 9 pt improvement in LEFS measure in order to demonstrate MCID improvement in daily function. Baseline: 33.8% 04/21/2021  INITIAL  3 Pt will be able to demonstrate  ability to STS without UE and pain in order to demonstrate functional improvement in bilat LE function for self-care and house hold duties.  04/21/2021 INITIAL  4 Pt will be able to report a decrease of hip and knee pain by >/= 2/10 at rest in order to demonstrate functional improvement in L hip and R knee pain.  04/21/2021 INITIAL   LONG TERM GOALS:   LTG Name Target Date Goal status  1 Pt  will become independent with final HEP in order to demonstrate synthesis of PT education.  06/02/2021  INITIAL  2 Pt will be able to perform 5XSTS in under 12s  in order to demonstrate functional improvement above the cut off score for adults.  06/02/2021 INITIAL  3 Pt will be able to demonstrate kneeling to stand, and stand to kneeling transfer with UE support in order to demonstrate functional improvement in LE function for ADL/house hold duties.  06/02/2021 INITIAL  4 Pt will be able to demonstrate/report ability to walk >30 mins without pain in order to demonstrate functional improvement and tolerance to exercise and community mobility.  06/02/2021 INITIAL   PLAN: PT FREQUENCY: 1-2x/week  PT DURATION: 6-8 weeks  PLANNED INTERVENTIONS: Therapeutic exercises, Therapeutic activity, Neuro Muscular re-education, Balance training, Gait training, Patient/Family education, Joint mobilization, Orthotic/Fit training, Aquatic Therapy, Dry Needling, Electrical stimulation, Cryotherapy, Moist heat, scar mobilization, Taping, Vasopneumatic device, Traction, Ultrasound, Ionotophoresis '4mg'$ /ml Dexamethasone, and Manual therapy  PLAN FOR NEXT SESSION: review HEP, clamshell/hip ABD iso, heel slide, calf stretching, SAQ/LAQ   Daleen Bo PT, DPT 04/07/21 4:37 PM

## 2021-04-08 ENCOUNTER — Ambulatory Visit (INDEPENDENT_AMBULATORY_CARE_PROVIDER_SITE_OTHER): Payer: 59 | Admitting: Family Medicine

## 2021-04-08 ENCOUNTER — Other Ambulatory Visit: Payer: Self-pay

## 2021-04-08 ENCOUNTER — Encounter (INDEPENDENT_AMBULATORY_CARE_PROVIDER_SITE_OTHER): Payer: Self-pay | Admitting: Family Medicine

## 2021-04-08 VITALS — BP 142/91 | HR 64 | Temp 98.4°F | Ht 65.0 in | Wt 312.0 lb

## 2021-04-08 DIAGNOSIS — Z9189 Other specified personal risk factors, not elsewhere classified: Secondary | ICD-10-CM

## 2021-04-08 DIAGNOSIS — R0602 Shortness of breath: Secondary | ICD-10-CM

## 2021-04-08 DIAGNOSIS — E559 Vitamin D deficiency, unspecified: Secondary | ICD-10-CM | POA: Diagnosis not present

## 2021-04-08 DIAGNOSIS — R5383 Other fatigue: Secondary | ICD-10-CM | POA: Diagnosis not present

## 2021-04-08 DIAGNOSIS — E7849 Other hyperlipidemia: Secondary | ICD-10-CM

## 2021-04-08 DIAGNOSIS — I10 Essential (primary) hypertension: Secondary | ICD-10-CM | POA: Diagnosis not present

## 2021-04-08 DIAGNOSIS — G4733 Obstructive sleep apnea (adult) (pediatric): Secondary | ICD-10-CM

## 2021-04-08 DIAGNOSIS — Z6841 Body Mass Index (BMI) 40.0 and over, adult: Secondary | ICD-10-CM

## 2021-04-08 DIAGNOSIS — R7303 Prediabetes: Secondary | ICD-10-CM

## 2021-04-08 DIAGNOSIS — Z1331 Encounter for screening for depression: Secondary | ICD-10-CM | POA: Diagnosis not present

## 2021-04-09 ENCOUNTER — Encounter (HOSPITAL_BASED_OUTPATIENT_CLINIC_OR_DEPARTMENT_OTHER): Payer: Self-pay | Admitting: Physical Therapy

## 2021-04-09 ENCOUNTER — Ambulatory Visit (HOSPITAL_BASED_OUTPATIENT_CLINIC_OR_DEPARTMENT_OTHER): Payer: 59 | Admitting: Physical Therapy

## 2021-04-09 DIAGNOSIS — M25561 Pain in right knee: Secondary | ICD-10-CM | POA: Diagnosis not present

## 2021-04-09 DIAGNOSIS — M6281 Muscle weakness (generalized): Secondary | ICD-10-CM

## 2021-04-09 DIAGNOSIS — R262 Difficulty in walking, not elsewhere classified: Secondary | ICD-10-CM

## 2021-04-09 LAB — CBC WITH DIFFERENTIAL/PLATELET
Basophils Absolute: 0.1 10*3/uL (ref 0.0–0.2)
Basos: 1 %
EOS (ABSOLUTE): 0.1 10*3/uL (ref 0.0–0.4)
Eos: 1 %
Hematocrit: 41.6 % (ref 34.0–46.6)
Hemoglobin: 13.4 g/dL (ref 11.1–15.9)
Immature Grans (Abs): 0 10*3/uL (ref 0.0–0.1)
Immature Granulocytes: 0 %
Lymphocytes Absolute: 2 10*3/uL (ref 0.7–3.1)
Lymphs: 38 %
MCH: 28.7 pg (ref 26.6–33.0)
MCHC: 32.2 g/dL (ref 31.5–35.7)
MCV: 89 fL (ref 79–97)
Monocytes Absolute: 0.3 10*3/uL (ref 0.1–0.9)
Monocytes: 5 %
Neutrophils Absolute: 2.8 10*3/uL (ref 1.4–7.0)
Neutrophils: 55 %
Platelets: 336 10*3/uL (ref 150–450)
RBC: 4.67 x10E6/uL (ref 3.77–5.28)
RDW: 13.8 % (ref 11.7–15.4)
WBC: 5.2 10*3/uL (ref 3.4–10.8)

## 2021-04-09 LAB — T4, FREE: Free T4: 1.37 ng/dL (ref 0.82–1.77)

## 2021-04-09 LAB — COMPREHENSIVE METABOLIC PANEL
ALT: 23 IU/L (ref 0–32)
AST: 19 IU/L (ref 0–40)
Albumin/Globulin Ratio: 1.4 (ref 1.2–2.2)
Albumin: 4.4 g/dL (ref 3.8–4.8)
Alkaline Phosphatase: 133 IU/L — ABNORMAL HIGH (ref 44–121)
BUN/Creatinine Ratio: 19 (ref 9–23)
BUN: 15 mg/dL (ref 6–24)
Bilirubin Total: 0.5 mg/dL (ref 0.0–1.2)
CO2: 26 mmol/L (ref 20–29)
Calcium: 9.7 mg/dL (ref 8.7–10.2)
Chloride: 99 mmol/L (ref 96–106)
Creatinine, Ser: 0.77 mg/dL (ref 0.57–1.00)
Globulin, Total: 3.2 g/dL (ref 1.5–4.5)
Glucose: 87 mg/dL (ref 65–99)
Potassium: 4.2 mmol/L (ref 3.5–5.2)
Sodium: 139 mmol/L (ref 134–144)
Total Protein: 7.6 g/dL (ref 6.0–8.5)
eGFR: 95 mL/min/{1.73_m2} (ref >59)

## 2021-04-09 LAB — HEMOGLOBIN A1C
Est. average glucose Bld gHb Est-mCnc: 134 mg/dL
Hgb A1c MFr Bld: 6.3 % — ABNORMAL HIGH (ref 4.8–5.6)

## 2021-04-09 LAB — T3: T3, Total: 89 ng/dL (ref 71–180)

## 2021-04-09 LAB — TSH: TSH: 1.18 u[IU]/mL (ref 0.450–4.500)

## 2021-04-09 LAB — FOLATE: Folate: 3.7 ng/mL (ref >3.0)

## 2021-04-09 LAB — LIPID PANEL WITH LDL/HDL RATIO
Cholesterol, Total: 197 mg/dL (ref 100–199)
HDL: 49 mg/dL (ref >39)
LDL Chol Calc (NIH): 133 mg/dL — ABNORMAL HIGH (ref 0–99)
LDL/HDL Ratio: 2.7 ratio (ref 0.0–3.2)
Triglycerides: 84 mg/dL (ref 0–149)
VLDL Cholesterol Cal: 15 mg/dL (ref 5–40)

## 2021-04-09 LAB — VITAMIN B12: Vitamin B-12: 441 pg/mL (ref 232–1245)

## 2021-04-09 LAB — VITAMIN D 25 HYDROXY (VIT D DEFICIENCY, FRACTURES): Vit D, 25-Hydroxy: 25.2 ng/mL — ABNORMAL LOW (ref 30.0–100.0)

## 2021-04-09 LAB — INSULIN, RANDOM: INSULIN: 20.3 u[IU]/mL (ref 2.6–24.9)

## 2021-04-09 NOTE — Therapy (Signed)
OUTPATIENT PHYSICAL THERAPY TREATMENT NOTE   Patient Name: Danielle Shaw MRN: [id number] DOB:Jan 06, 1971, 50 y.o., female Today's Date: 04/09/2021  PCP: Isaac Bliss, Rayford Halsted, MD REFERRING PROVIDER: Isaac Bliss, Estel*    PT End of Session - 04/09/21 1535     Visit Number 2    Number of Visits 13    Date for PT Re-Evaluation 07/06/21    Authorization Type UHC    PT Start Time [id number]    PT Stop Time [id number]    PT Time Calculation (min) 40 min             Past Medical History:  Diagnosis Date   Arrhythmia    Arthritis    GERD (gastroesophageal reflux disease)    Hyperlipidemia    Hypertension    IUD 2004   IUD 2009   Migraine    Palpitations    Sleep apnea    SOB (shortness of breath)    Past Surgical History:  Procedure Laterality Date   BREAST BIOPSY Right 2011   BREAST SURGERY  2000   reduction   CESAREAN SECTION     COLONOSCOPY WITH PROPOFOL N/A 07/11/2018   Procedure: COLONOSCOPY WITH PROPOFOL;  Surgeon: Thornton Park, MD;  Location: [facility];  Service: Gastroenterology;  Laterality: N/A;   Patient Active Problem List   Diagnosis Date Noted   Neck pain on left side 05/19/2018   Blood in stool 05/19/2018   Circadian rhythm sleep disorder, shift work type 03/30/2018   Obstructive sleep apnea hypopnea, severe 03/30/2018   Carpal tunnel syndrome on both sides 03/30/2018   SI (sacroiliac) joint dysfunction 01/17/2018   Left flank pain 01/07/2018   Sleep paralysis, recurrent isolated 10/20/2017   Complaint related to dreams 10/20/2017   Arthralgia 12/26/2016   Allergic rhinitis 05/03/2015   Right knee pain 04/27/2013   Annual physical exam 07/15/2012   Presence of intrauterine contraceptive device (IUD)    Morbid obesity ([place]) 03/07/2007   Hyperlipidemia 09/23/2006   MIGRAINE, UNSPEC., W/O INTRACTABLE MIGRAINE 09/23/2006   HYPERTENSION, BENIGN SYSTEMIC 09/23/2006    ONSET DATE: May 2022   REFERRING DIAG: M25.561 (ICD-10-CM) - Acute  pain of right knee M70.72 (ICD-10-CM) - Ischial bursitis of left side M76.892 (ICD-10-CM) - Hamstring tendinitis of left thigh    THERAPY DIAG:  Pain in joint of right knee   Muscle weakness (generalized)   Difficulty walking  SUBJECTIVE: Pt states L hip is more sore today possibly from slipping while in the kitchen. No fall but she was able to catch herself. When she turned to go into the kitchen, she made more of a sudden movement. She caught her self on the L leg. Pt states the knee pain is not worse. Pt states she did not have issues with HEP.   PAIN:  Are you having pain? Yes VAS scale: 8/10 Pain location: L hip posterior Pain orientation: Left  PAIN TYPE: aching, sharp, and throbbing Pain description: intermittent    OBJECTIVE: LTR in L glute med, TTP of L gluteals and hip rotators. Gentle ischemic pressure applied. Strong quadriceps contraction sustained for 5s with quad set. No tetany noted with extended repetitions   Today's Treatment:     09/14  Manual:  STM L hip rotators/gluteals  Exercise:  LAQ 2x10 no weight Hip ABD iso in supine- RTB at knees 5s 2x10 Hooklying Single Knee to Chest Stretch - 3 reps - 30 hold (on hold due to pain) Supine Quad Set   15x 5 hold  Supine Bridge -  sets - 10 reps - 2 hold Seated Ankle Dorsiflexion Stretch - 2 1 sets - 3 reps - 20 hold  04/07/2021 Exercises Hooklying Single Knee to Chest Stretch - 3 reps - 30 hold Supine Quad Set  2 sets - 10 reps - 5 hold Supine Bridge -  sets - 10 reps - 2 hold Seated Ankle Dorsiflexion Stretch - 2 1 sets - 3 reps - 20 hold       PATIENT EDUCATION:  Education details: MOI, diagnosis, prognosis, anatomy, exercise progression, DOMS expectations, muscle firing, HEP, POC Person educated: Patient Education method: Explanation, Demonstration, Tactile cues, Verbal cues, and Handouts Education comprehension: verbalized understanding, returned demonstration, verbal cues required, and tactile cues  required     HOME EXERCISE PROGRAM: Access Code: [email or web address]    ASSESSMENT:   CLINICAL IMPRESSION: Pt with good response to exercise therapy at today's session. LTR was elicited during manual therapy but pt did not report relief of pain or stiffness following. Pt was able to introduce hip isometrics and AROM knee extension against gravity. Slight discomfort noted but there was no increase in reported pain. Pt L hip sensitivity at today's session likely due to report of reactive stepping/loading of L hip stabilizers. Pt HEP modified to remove aggravating stretch for isometric. Pt  will benefit from skilled PT to address above impairments and improve overall function.   REHAB POTENTIAL: Good   CLINICAL DECISION MAKING: Stable/uncomplicated   EVALUATION COMPLEXITY: Low     GOALS:     SHORT TERM GOALS:   STG Name Target Date Goal status  1 Pt will become independent with HEP in order to demonstrate synthesis of PT education.   04/21/2021   INITIAL  2 Pt will have an at least 9 pt improvement in LEFS measure in order to demonstrate MCID improvement in daily function. Baseline: 33.8% 04/21/2021  INITIAL  3 Pt will be able to demonstrate ability to STS without UE and pain in order to demonstrate functional improvement in bilat LE function for self-care and house hold duties.   04/21/2021 INITIAL  4 Pt will be able to report a decrease of hip and knee pain by >/= 2/10 at rest in order to demonstrate functional improvement in L hip and R knee pain.   04/21/2021 INITIAL    LONG TERM GOALS:    LTG Name Target Date Goal status  1 Pt  will become independent with final HEP in order to demonstrate synthesis of PT education.   06/02/2021   INITIAL  2 Pt will be able to perform 5XSTS in under 12s  in order to demonstrate functional improvement above the cut off score for adults.   06/02/2021 INITIAL  3 Pt will be able to demonstrate kneeling to stand, and stand to kneeling transfer with UE support  in order to demonstrate functional improvement in LE function for ADL/house hold duties.   06/02/2021 INITIAL  4 Pt will be able to demonstrate/report ability to walk >30 mins without pain in order to demonstrate functional improvement and tolerance to exercise and community mobility.   06/02/2021 INITIAL    PLAN: PT FREQUENCY: 1-2x/week   PT DURATION: 6-8 weeks   PLANNED INTERVENTIONS: Therapeutic exercises, Therapeutic activity, Neuro Muscular re-education, Balance training, Gait training, Patient/Family education, Joint mobilization, Orthotic/Fit training, Aquatic Therapy, Dry Needling, Electrical stimulation, Cryotherapy, Moist heat, scar mobilization, Taping, Vasopneumatic device, Traction, Ultrasound, Ionotophoresis '4mg'$ /ml Dexamethasone, and Manual therapy   PLAN FOR NEXT SESSION: review HEP, clamshell, heel  slide, calf stretching  Daleen Bo PT, DPT 04/09/21 3:52 PM     [facility] [address] [place], [place], [address] Phone: [phone]   Fax:  [phone]  Patient name: Danielle Shaw MRN: [id number] DOB: 1970-12-31

## 2021-04-09 NOTE — Progress Notes (Signed)
Chief Complaint:   OBESITY Danielle Shaw (MR# [id number]) is a 50 y.o. female who presents for evaluation and treatment of obesity and related comorbidities. Current BMI is Body mass index is 51.92 kg/m. Danielle Shaw has been struggling with her weight for many years and has been unsuccessful in either losing weight, maintaining weight loss, or reaching her healthy weight goal.  Danielle Shaw is currently in the action stage of change and ready to dedicate time achieving and maintaining a healthier weight. Danielle Shaw is interested in becoming our patient and working on intensive lifestyle modifications including (but not limited to) diet and exercise for weight loss.  Returning patient. Danielle Shaw went to [facility] program. She is still working with [employer] as a [name], academic. Pt may have a banana in the morning but often skips breakfast; Lunch- [employer] steak salad or steak, broccoli, potatoes or chicken wings (4-8) + salad; sub sandwich + fries + sweet tea. If she eats dinner will be [employer] chicken salad.  Danielle Shaw's habits were reviewed today and are as follows: her desired weight loss is 62-82 lbs, she started gaining weight after her first child, her heaviest weight ever was 350 pounds, she has significant food cravings issues, she skips meals frequently, she is frequently drinking liquids with calories, and she frequently makes poor food choices.  Depression Screen Danielle Shaw's Food and Mood (modified PHQ-9) score was 5.  Depression screen PHQ 2/9 04/08/2021  Decreased Interest 1  Down, Depressed, Hopeless 0  PHQ - 2 Score 1  Altered sleeping 2  Tired, decreased energy 2  Change in appetite 0  Feeling bad or failure about yourself  0  Trouble concentrating 0  Moving slowly or fidgety/restless 0  Suicidal thoughts 0  PHQ-9 Score 5  Difficult doing work/chores Not difficult at all  Some recent data might be hidden   Subjective:   1. Other fatigue Danielle Shaw admits to daytime somnolence and admits to  waking up still tired. Patent has a history of symptoms of daytime fatigue, morning fatigue, and morning headache. Danielle Shaw generally gets  4-6  hours of sleep per night, and states that she has poor sleep quality. Snoring is present. Apneic episodes are present. Epworth Sleepiness Score is 14. EKG normal sinus rhythm at 71 bpm; T-wave flattening.  2. SOB (shortness of breath) Danielle Shaw notes increasing shortness of breath with exercising and seems to be worsening over time with weight gain. She notes getting out of breath sooner with activity than she used to. This has gotten worse recently. [name] denies shortness of breath at rest or orthopnea.  3. Essential hypertension BP slightly elevated today. Pt denies chest pain/chest pressure/headache. She is on losartan 100 mg and spironolactone 25 mg daily.  4. Pre-diabetes Danielle Shaw last A1c was 6.3 in 2022. She is not on medication.  5. Other hyperlipidemia Her last LDL was 106, HDL 39, and triglycerides 87. She is on rosuvastatin.  6. Vitamin D deficiency Pt is not on a Vit D supplement. Her last Vit D level was 16.  7. Obstructive sleep apnea hypopnea, severe Pt has an appt with Dr. Estanislado Pandy in November. She doesn't have a mask.  8. At risk for deficient intake of food Danielle Shaw is at risk for deficient intake of food due to eating 1 meal a day.  Assessment/Plan:   1. Other fatigue Danielle Shaw does feel that her weight is causing her energy to be lower than it should be. Fatigue may be related to obesity, depression or many  other causes. Labs will be ordered, and in the meanwhile, Danielle Shaw will focus on self care including making healthy food choices, increasing physical activity and focusing on stress reduction. Check labs today.  - EKG 12-Lead - Vitamin B12 - CBC with Differential/Platelet - Folate - T3 - T4, free - TSH  2. SOB (shortness of breath) Danielle Shaw does feel that she gets out of breath more easily that she used to when she exercises. Danielle Shaw's  shortness of breath appears to be obesity related and exercise induced. She has agreed to work on weight loss and gradually increase exercise to treat her exercise induced shortness of breath. Will continue to monitor closely.  3. Essential hypertension Danielle Shaw is working on healthy weight loss and exercise to improve blood pressure control. We will watch for signs of hypotension as she continues her lifestyle modifications. Check labs today.  - Comprehensive metabolic panel  4. Pre-diabetes Danielle Shaw will continue to work on weight loss, exercise, and decreasing simple carbohydrates to help decrease the risk of diabetes.  Check labs today.  - Hemoglobin A1c - Insulin, random  5. Other hyperlipidemia Cardiovascular risk and specific lipid/LDL goals reviewed.  We discussed several lifestyle modifications today and Danielle Shaw will continue to work on diet, exercise and weight loss efforts. Orders and follow up as documented in patient record.   Counseling Intensive lifestyle modifications are the first line treatment for this issue. Dietary changes: Increase soluble fiber. Decrease simple carbohydrates. Exercise changes: Moderate to vigorous-intensity aerobic activity 150 minutes per week if tolerated. Lipid-lowering medications: see documented in medical record. Check labs today.  - Lipid Panel With LDL/HDL Ratio  6. Vitamin D deficiency Low Vitamin D level contributes to fatigue and are associated with obesity, breast, and colon cancer. She will follow-up for routine testing of Vitamin D, at least 2-3 times per year to avoid over-replacement. Check labs today.  - VITAMIN D 25 Hydroxy (Vit-D Deficiency, Fractures)  7. Obstructive sleep apnea hypopnea, severe Intensive lifestyle modifications are the first line treatment for this issue. We discussed several lifestyle modifications today and she will continue to work on diet, exercise and weight loss efforts. We will continue to monitor. Orders  and follow up as documented in patient record. Follow up with pt after November appt.  8. Depression screening Danielle Shaw had a positive depression screening. Depression is commonly associated with obesity and often results in emotional eating behaviors. We will monitor this closely and work on CBT to help improve the non-hunger eating patterns. Referral to Psychology may be required if no improvement is seen as she continues in our clinic.  9. At risk for deficient intake of food Danielle Shaw was given approximately 25 minutes of deficit intake of food prevention counseling today. Danielle Shaw is at risk for eating too few calories based on current food recall. She was encouraged to focus on meeting caloric and protein goals according to her recommended meal plan.    10. Obesity with current BMI of 52.1  Danielle Shaw is currently in the action stage of change and her goal is to continue with weight loss efforts. I recommend Danielle Shaw begin the structured treatment plan as follows:  She has agreed to the Category 3 Plan and keeping a food journal and adhering to recommended goals of 350-500 calories and 35+ grams protein with lunch.  Exercise goals: No exercise has been prescribed at this time.   Behavioral modification strategies: increasing lean protein intake, meal planning and cooking strategies, keeping healthy foods in the home, and planning  for success.  She was informed of the importance of frequent follow-up visits to maximize her success with intensive lifestyle modifications for her multiple health conditions. She was informed we would discuss her lab results at her next visit unless there is a critical issue that needs to be addressed sooner. Danielle Shaw agreed to keep her next visit at the agreed upon time to discuss these results.  Objective:   Blood pressure (!) 142/91, pulse 64, temperature 98.4 F (36.9 C), height '5\' 5"'$  (1.651 m), weight (!) 312 lb (141.5 kg), SpO2 100 %. Body mass index is 51.92 kg/m.  EKG:  Normal sinus rhythm, rate 71- T-wave flattening.  Indirect Calorimeter completed today shows a VO2 of 283 and a REE of 1958.  Her calculated basal metabolic rate is [id number] thus her basal metabolic rate is worse than expected.  General: Cooperative, alert, well developed, in no acute distress. HEENT: Conjunctivae and lids unremarkable. Cardiovascular: Regular rhythm.  Lungs: Normal work of breathing. Neurologic: No focal deficits.   Lab Results  Component Value Date   CREATININE 0.77 04/08/2021   BUN 15 04/08/2021   NA 139 04/08/2021   K 4.2 04/08/2021   CL 99 04/08/2021   CO2 26 04/08/2021   Lab Results  Component Value Date   ALT 23 04/08/2021   AST 19 04/08/2021   ALKPHOS 133 (H) 04/08/2021   BILITOT 0.5 04/08/2021   Lab Results  Component Value Date   HGBA1C 6.3 (H) 04/08/2021   HGBA1C 6.0 (H) 09/27/2018   HGBA1C 6.0 (H) 03/17/2018   HGBA1C 6.2 (H) 12/08/2017   HGBA1C 6.4 12/24/2016   Lab Results  Component Value Date   INSULIN 20.3 04/08/2021   INSULIN 22.0 09/27/2018   INSULIN 16.3 03/17/2018   INSULIN 20.9 12/08/2017   Lab Results  Component Value Date   TSH 1.180 04/08/2021   Lab Results  Component Value Date   CHOL 197 04/08/2021   HDL 49 04/08/2021   LDLCALC 133 (H) 04/08/2021   LDLDIRECT 177 (H) 10/28/2010   TRIG 84 04/08/2021   CHOLHDL 7 12/24/2016   Lab Results  Component Value Date   WBC 5.2 04/08/2021   HGB 13.4 04/08/2021   HCT 41.6 04/08/2021   MCV 89 04/08/2021   PLT 336 04/08/2021    Attestation Statements:   Reviewed by clinician on day of visit: allergies, medications, problem list, medical history, surgical history, family history, social history, and previous encounter notes.  Coral Ceo, CMA, am acting as transcriptionist for Coralie Common, MD.  I have reviewed the above documentation for accuracy and completeness, and I agree with the above. - Coralie Common, MD

## 2021-04-14 ENCOUNTER — Ambulatory Visit (HOSPITAL_BASED_OUTPATIENT_CLINIC_OR_DEPARTMENT_OTHER): Payer: 59 | Admitting: Physical Therapy

## 2021-04-16 ENCOUNTER — Encounter (HOSPITAL_BASED_OUTPATIENT_CLINIC_OR_DEPARTMENT_OTHER): Payer: Self-pay | Admitting: Physical Therapy

## 2021-04-16 ENCOUNTER — Ambulatory Visit (HOSPITAL_BASED_OUTPATIENT_CLINIC_OR_DEPARTMENT_OTHER): Payer: 59 | Admitting: Physical Therapy

## 2021-04-16 ENCOUNTER — Other Ambulatory Visit: Payer: Self-pay

## 2021-04-16 DIAGNOSIS — R262 Difficulty in walking, not elsewhere classified: Secondary | ICD-10-CM

## 2021-04-16 DIAGNOSIS — M25561 Pain in right knee: Secondary | ICD-10-CM

## 2021-04-16 DIAGNOSIS — M6281 Muscle weakness (generalized): Secondary | ICD-10-CM

## 2021-04-16 NOTE — Therapy (Signed)
OUTPATIENT PHYSICAL THERAPY TREATMENT NOTE   Patient Name: Danielle Shaw MRN: [id number] DOB:1971/07/22, 50 y.o., female Today's Date: 04/16/2021  PCP: Isaac Bliss, Rayford Halsted, MD REFERRING PROVIDER: Isaac Bliss, Estel*    PT End of Session - 04/16/21 1555     Visit Number 3    Number of Visits 13    Date for PT Re-Evaluation 07/06/21    Authorization Type [employer]    PT Start Time 1600    PT Stop Time 1640    PT Time Calculation (min) 40 min    Activity Tolerance Patient tolerated treatment well    Behavior During Therapy [facility] for tasks assessed/performed              Past Medical History:  Diagnosis Date   Arrhythmia    Arthritis    GERD (gastroesophageal reflux disease)    Hyperlipidemia    Hypertension    IUD 2004   IUD 2009   Migraine    Palpitations    Sleep apnea    SOB (shortness of breath)    Past Surgical History:  Procedure Laterality Date   BREAST BIOPSY Right 2011   BREAST SURGERY  2000   reduction   CESAREAN SECTION     COLONOSCOPY WITH PROPOFOL N/A 07/11/2018   Procedure: COLONOSCOPY WITH PROPOFOL;  Surgeon: Thornton Park, MD;  Location: [facility];  Service: Gastroenterology;  Laterality: N/A;   Patient Active Problem List   Diagnosis Date Noted   Neck pain on left side 05/19/2018   Blood in stool 05/19/2018   Circadian rhythm sleep disorder, shift work type 03/30/2018   Obstructive sleep apnea hypopnea, severe 03/30/2018   Carpal tunnel syndrome on both sides 03/30/2018   SI (sacroiliac) joint dysfunction 01/17/2018   Left flank pain 01/07/2018   Sleep paralysis, recurrent isolated 10/20/2017   Complaint related to dreams 10/20/2017   Arthralgia 12/26/2016   Allergic rhinitis 05/03/2015   Right knee pain 04/27/2013   Annual physical exam 07/15/2012   Presence of intrauterine contraceptive device (IUD)    Morbid obesity ([place]) 03/07/2007   Hyperlipidemia 09/23/2006   MIGRAINE, UNSPEC., W/O INTRACTABLE MIGRAINE 09/23/2006    HYPERTENSION, BENIGN SYSTEMIC 09/23/2006    ONSET DATE: May 2022   REFERRING DIAG: M25.561 (ICD-10-CM) - Acute pain of right knee M70.72 (ICD-10-CM) - Ischial bursitis of left side M76.892 (ICD-10-CM) - Hamstring tendinitis of left thigh    THERAPY DIAG:  Pain in joint of right knee   Muscle weakness (generalized)   Difficulty walking  SUBJECTIVE: Pt states she was not more sore than normal after last session. She states the L hip still bothers her today but it is better than last time. She has been walking and sitting more at work.   PAIN:  Are you having pain? Yes VAS scale: 7/10 Pain location: L hip posterior and R knee Pain orientation: Left and R PAIN TYPE: aching, sharp, and throbbing Pain description: intermittent    OBJECTIVE: TTP of L gluteals and hip rotators. Hypertonicity of L HS group. Improved WB with gait and longer stance time following manual     Today's Treatment:    TODAY-09/21 Manual:  STM L hip rotators/gluteals and L HS  Exercise:  LAQ 2x10 no weight Hip ABD iso in supine- GTB at knees 5s 2x10 Supine Bridge -  sets - 10 reps - 2 hold Seated Ankle Dorsiflexion Stretch - 2 1 sets - 3 reps - 20 hold Seated HS stretch 30s 3x on L STS thigh  height table 2x10   09/14 Manual:  STM L hip rotators/gluteals   Exercise:  LAQ 2x10 no weight Hip ABD iso in supine- RTB at knees 5s 2x10 Hooklying Single Knee to Chest Stretch - 3 reps - 30 hold (on hold due to pain) Supine Quad Set   15x 5 hold Supine Bridge -  sets - 10 reps - 2 hold Seated Ankle Dorsiflexion Stretch - 2 1 sets - 3 reps - 20 hold    PATIENT EDUCATION:  Education details: anatomy, exercise progression, DOMS expectations, muscle firing, HEP, envelope of function,  Person educated: Patient Education method: Explanation, Demonstration, Tactile cues, Verbal cues, and Handouts Education comprehension: verbalized understanding, returned demonstration, verbal cues required, and tactile  cues required     HOME EXERCISE PROGRAM: Access Code: [email or web address]    ASSESSMENT:   CLINICAL IMPRESSION: Pt presents with L HS stiffness at today's session that was relieved with manual and stretching exercise. Pt complaints of isch tuberosity pain likely combination of HS stiffness and L glute max pain, due to pt relief of pain following HS stretching and manual. Pt able to progress R knee AROM exercise without pain and slowly build up CKC tolerance with box squat. Plan to continue with CKC WB activity progression and manual on L LE/hip as tolerated. Pt  will benefit from skilled PT to address above impairments and improve overall function.   REHAB POTENTIAL: Good   CLINICAL DECISION MAKING: Stable/uncomplicated   EVALUATION COMPLEXITY: Low     GOALS:     SHORT TERM GOALS:   STG Name Target Date Goal status  1 Pt will become independent with HEP in order to demonstrate synthesis of PT education.   04/21/2021   INITIAL  2 Pt will have an at least 9 pt improvement in LEFS measure in order to demonstrate MCID improvement in daily function. Baseline: 33.8% 04/21/2021  INITIAL  3 Pt will be able to demonstrate ability to STS without UE and pain in order to demonstrate functional improvement in bilat LE function for self-care and house hold duties.   04/21/2021 INITIAL  4 Pt will be able to report a decrease of hip and knee pain by >/= 2/10 at rest in order to demonstrate functional improvement in L hip and R knee pain.   04/21/2021 INITIAL    LONG TERM GOALS:    LTG Name Target Date Goal status  1 Pt  will become independent with final HEP in order to demonstrate synthesis of PT education.   06/02/2021   INITIAL  2 Pt will be able to perform 5XSTS in under 12s  in order to demonstrate functional improvement above the cut off score for adults.   06/02/2021 INITIAL  3 Pt will be able to demonstrate kneeling to stand, and stand to kneeling transfer with UE support in order to demonstrate  functional improvement in LE function for ADL/house hold duties.   06/02/2021 INITIAL  4 Pt will be able to demonstrate/report ability to walk >30 mins without pain in order to demonstrate functional improvement and tolerance to exercise and community mobility.   06/02/2021 INITIAL    PLAN: PT FREQUENCY: 1-2x/week   PT DURATION: 6-8 weeks   PLANNED INTERVENTIONS: Therapeutic exercises, Therapeutic activity, Neuro Muscular re-education, Balance training, Gait training, Patient/Family education, Joint mobilization, Orthotic/Fit training, Aquatic Therapy, Dry Needling, Electrical stimulation, Cryotherapy, Moist heat, scar mobilization, Taping, Vasopneumatic device, Traction, Ultrasound, Ionotophoresis 4mg /ml Dexamethasone, and Manual therapy   PLAN FOR NEXT SESSION:  lower box squat,  heel toe rocking, clamshell, STM to L hip and HS, standing hip ABD   Daleen Bo PT, DPT 04/16/21 4:36 PM     [facility] [address] [place], [place], [address] Phone: [phone]   Fax:  [phone]  Patient name: Danielle Shaw MRN: [id number] DOB: Apr 15, 1971

## 2021-04-22 ENCOUNTER — Other Ambulatory Visit: Payer: Self-pay

## 2021-04-22 ENCOUNTER — Ambulatory Visit (INDEPENDENT_AMBULATORY_CARE_PROVIDER_SITE_OTHER): Payer: 59 | Admitting: Family Medicine

## 2021-04-22 ENCOUNTER — Encounter (INDEPENDENT_AMBULATORY_CARE_PROVIDER_SITE_OTHER): Payer: Self-pay | Admitting: Family Medicine

## 2021-04-22 VITALS — BP 146/84 | HR 72 | Temp 98.1°F | Ht 65.0 in | Wt 308.0 lb

## 2021-04-22 DIAGNOSIS — E7849 Other hyperlipidemia: Secondary | ICD-10-CM

## 2021-04-22 DIAGNOSIS — R7303 Prediabetes: Secondary | ICD-10-CM

## 2021-04-22 DIAGNOSIS — E559 Vitamin D deficiency, unspecified: Secondary | ICD-10-CM | POA: Diagnosis not present

## 2021-04-22 DIAGNOSIS — Z6841 Body Mass Index (BMI) 40.0 and over, adult: Secondary | ICD-10-CM

## 2021-04-22 DIAGNOSIS — Z9189 Other specified personal risk factors, not elsewhere classified: Secondary | ICD-10-CM | POA: Diagnosis not present

## 2021-04-22 DIAGNOSIS — I10 Essential (primary) hypertension: Secondary | ICD-10-CM | POA: Diagnosis not present

## 2021-04-22 MED ORDER — TIRZEPATIDE 2.5 MG/0.5ML ~~LOC~~ SOAJ: 2.5000 mg | SUBCUTANEOUS | 0 refills | Status: DC

## 2021-04-22 MED ORDER — VITAMIN D (ERGOCALCIFEROL) 1.25 MG (50000 UNIT) PO CAPS: 50000.0000 [IU] | ORAL_CAPSULE | ORAL | 0 refills | Status: DC

## 2021-04-22 NOTE — Progress Notes (Signed)
Chief Complaint:   OBESITY Danielle Shaw is here to discuss her progress with her obesity treatment plan along with follow-up of her obesity related diagnoses. Danielle Shaw is on the Category 3 Plan and keeping a food journal and adhering to recommended goals of 350-500 calories and 35 grams protein and states she is following her eating plan approximately 100% of the time. Danielle Shaw states she is not currently exercising.  Today's visit was #: 2 Starting weight: 312 lbs Starting date: 04/08/2021 Today's weight: 308 lbs Today's date: 04/22/2021 Total lbs lost to date: 4 Total lbs lost since last in-office visit: 4  Interim History: Danielle Shaw's first 2 weeks of 2nd go around has been alright- difficult in terms of quantity of food. She doesn't like milk or much yogurt. She substituted milk with 2 oz of [place] sausage. Lunch was ok- mostly tried to adhere to plan. Pt has been cooking but dinner is hard to cook for just 1 person. She doesn't like much cheese. Pt is wondering about ways to incorporate easier to follow options.  Subjective:   1. Other hyperlipidemia Danielle Shaw's labs revealed an LDL os 133, HDL 49, and triglycerides 84. She is on Crestor. Prior LDL's were over 200 (likely some familial component).  2. Vitamin D deficiency Pt is not on prescription Vit D anymore. She reports fatigue.  3. Pre-diabetes Pt's A1c is 6.3 and insulin level 20.3. She has a propensity for carbs.  4. Essential hypertension BP slightly elevated today. Pt denies chest pain/chest pressure/headache.  5. At risk for deficient intake of food Danielle Shaw is at risk for deficient intake of food due to skipping meals.  Assessment/Plan:   1. Other hyperlipidemia Cardiovascular risk and specific lipid/LDL goals reviewed.  We discussed several lifestyle modifications today and Danielle Shaw will continue to work on diet, exercise and weight loss efforts. Orders and follow up as documented in patient record. Continue  Crestor.  Counseling Intensive lifestyle modifications are the first line treatment for this issue. Dietary changes: Increase soluble fiber. Decrease simple carbohydrates. Exercise changes: Moderate to vigorous-intensity aerobic activity 150 minutes per week if tolerated. Lipid-lowering medications: see documented in medical record.  2. Vitamin D deficiency Low Vitamin D level contributes to fatigue and are associated with obesity, breast, and colon cancer. She agrees to start prescription Vitamin D 50,000 IU every week and will follow-up for routine testing of Vitamin D, at least 2-3 times per year to avoid over-replacement.  Start- Vitamin D, Ergocalciferol, (DRISDOL) 1.25 MG (50000 UNIT) CAPS capsule; Take 1 capsule (50,000 Units total) by mouth every 7 (seven) days.  Dispense: 4 capsule; Refill: 0  3. Pre-diabetes Danielle Shaw will start Mounjaro 2.5 mg and continue to work on weight loss, exercise, and decreasing simple carbohydrates to help decrease the risk of diabetes.   Start- tirzepatide [facility]) 2.5 MG/0.5ML Pen; Inject 2.5 mg into the skin once a week.  Dispense: 2 mL; Refill: 0  4. Essential hypertension Danielle Shaw is working on healthy weight loss and exercise to improve blood pressure control. We will watch for signs of hypotension as she continues her lifestyle modifications. Continue current treatment plan.  5. At risk for deficient intake of food Danielle Shaw was given approximately 30 minutes of deficit intake of food prevention counseling today. Danielle Shaw is at risk for eating too few calories based on current food recall. She was encouraged to focus on meeting caloric and protein goals according to her recommended meal plan.    6. Obesity with current BMI of 51.3  Danielle Shaw is currently in the action stage of change. As such, her goal is to continue with weight loss efforts. She has agreed to keeping a food journal and adhering to recommended goals of 1350-1500 calories and 90+ grams protein.    Exercise goals: No exercise has been prescribed at this time.  Behavioral modification strategies: increasing lean protein intake, meal planning and cooking strategies, and keeping healthy foods in the home.  Danielle Shaw has agreed to follow-up with our clinic in 2 weeks. She was informed of the importance of frequent follow-up visits to maximize her success with intensive lifestyle modifications for her multiple health conditions.   Objective:   Blood pressure (!) 146/84, pulse 72, temperature 98.1 F (36.7 C), height 5\' 5"  (1.651 m), weight (!) 308 lb (139.7 kg), SpO2 92 %. Body mass index is 51.25 kg/m.  General: Cooperative, alert, well developed, in no acute distress. HEENT: Conjunctivae and lids unremarkable. Cardiovascular: Regular rhythm.  Lungs: Normal work of breathing. Neurologic: No focal deficits.   Lab Results  Component Value Date   CREATININE 0.77 04/08/2021   BUN 15 04/08/2021   NA 139 04/08/2021   K 4.2 04/08/2021   CL 99 04/08/2021   CO2 26 04/08/2021   Lab Results  Component Value Date   ALT 23 04/08/2021   AST 19 04/08/2021   ALKPHOS 133 (H) 04/08/2021   BILITOT 0.5 04/08/2021   Lab Results  Component Value Date   HGBA1C 6.3 (H) 04/08/2021   HGBA1C 6.0 (H) 09/27/2018   HGBA1C 6.0 (H) 03/17/2018   HGBA1C 6.2 (H) 12/08/2017   HGBA1C 6.4 12/24/2016   Lab Results  Component Value Date   INSULIN 20.3 04/08/2021   INSULIN 22.0 09/27/2018   INSULIN 16.3 03/17/2018   INSULIN 20.9 12/08/2017   Lab Results  Component Value Date   TSH 1.180 04/08/2021   Lab Results  Component Value Date   CHOL 197 04/08/2021   HDL 49 04/08/2021   LDLCALC 133 (H) 04/08/2021   LDLDIRECT 177 (H) 10/28/2010   TRIG 84 04/08/2021   CHOLHDL 7 12/24/2016   Lab Results  Component Value Date   VD25OH 25.2 (L) 04/08/2021   VD25OH 32.4 09/27/2018   VD25OH 29.3 (L) 03/17/2018   Lab Results  Component Value Date   WBC 5.2 04/08/2021   HGB 13.4 04/08/2021   HCT 41.6  04/08/2021   MCV 89 04/08/2021   PLT 336 04/08/2021    Attestation Statements:   Reviewed by clinician on day of visit: allergies, medications, problem list, medical history, surgical history, family history, social history, and previous encounter notes.  Coral Ceo, CMA, am acting as transcriptionist for Coralie Common, MD.  I have reviewed the above documentation for accuracy and completeness, and I agree with the above. - Coralie Common, MD

## 2021-04-23 ENCOUNTER — Ambulatory Visit (HOSPITAL_BASED_OUTPATIENT_CLINIC_OR_DEPARTMENT_OTHER): Payer: 59 | Admitting: Physical Therapy

## 2021-04-23 ENCOUNTER — Encounter (INDEPENDENT_AMBULATORY_CARE_PROVIDER_SITE_OTHER): Payer: Self-pay

## 2021-04-23 ENCOUNTER — Encounter (HOSPITAL_BASED_OUTPATIENT_CLINIC_OR_DEPARTMENT_OTHER): Payer: Self-pay | Admitting: Physical Therapy

## 2021-04-23 DIAGNOSIS — M25561 Pain in right knee: Secondary | ICD-10-CM

## 2021-04-23 DIAGNOSIS — R262 Difficulty in walking, not elsewhere classified: Secondary | ICD-10-CM

## 2021-04-23 DIAGNOSIS — M6281 Muscle weakness (generalized): Secondary | ICD-10-CM

## 2021-04-23 NOTE — Therapy (Signed)
OUTPATIENT PHYSICAL THERAPY TREATMENT NOTE   Patient Name: Danielle Shaw MRN: [id number] DOB:1971-07-25, 50 y.o., female Today's Date: 04/23/2021  PCP: Isaac Bliss, Rayford Halsted, MD REFERRING PROVIDER: Isaac Bliss, Estel*    PT End of Session - 04/23/21 1611     Visit Number 4    Number of Visits 13    Date for PT Re-Evaluation 07/06/21    Authorization Type UHC    PT Start Time 1602    PT Stop Time 0254    PT Time Calculation (min) 40 min    Activity Tolerance Patient tolerated treatment well    Behavior During Therapy [facility] for tasks assessed/performed               Past Medical History:  Diagnosis Date   Arrhythmia    Arthritis    GERD (gastroesophageal reflux disease)    Hyperlipidemia    Hypertension    IUD 2004   IUD 2009   Migraine    Palpitations    Sleep apnea    SOB (shortness of breath)    Past Surgical History:  Procedure Laterality Date   BREAST BIOPSY Right 2011   BREAST SURGERY  2000   reduction   CESAREAN SECTION     COLONOSCOPY WITH PROPOFOL N/A 07/11/2018   Procedure: COLONOSCOPY WITH PROPOFOL;  Surgeon: Thornton Park, MD;  Location: [facility];  Service: Gastroenterology;  Laterality: N/A;   Patient Active Problem List   Diagnosis Date Noted   Neck pain on left side 05/19/2018   Blood in stool 05/19/2018   Circadian rhythm sleep disorder, shift work type 03/30/2018   Obstructive sleep apnea hypopnea, severe 03/30/2018   Carpal tunnel syndrome on both sides 03/30/2018   SI (sacroiliac) joint dysfunction 01/17/2018   Left flank pain 01/07/2018   Sleep paralysis, recurrent isolated 10/20/2017   Complaint related to dreams 10/20/2017   Arthralgia 12/26/2016   Allergic rhinitis 05/03/2015   Right knee pain 04/27/2013   Annual physical exam 07/15/2012   Presence of intrauterine contraceptive device (IUD)    Morbid obesity ([place]) 03/07/2007   Hyperlipidemia 09/23/2006   MIGRAINE, UNSPEC., W/O INTRACTABLE MIGRAINE  09/23/2006   HYPERTENSION, BENIGN SYSTEMIC 09/23/2006    ONSET DATE: May 2022   REFERRING DIAG: M25.561 (ICD-10-CM) - Acute pain of right knee M70.72 (ICD-10-CM) - Ischial bursitis of left side M76.892 (ICD-10-CM) - Hamstring tendinitis of left thigh    THERAPY DIAG:  Pain in joint of right knee   Muscle weakness (generalized)   Difficulty walking  SUBJECTIVE: Pt states the hip pain is lower. She states that sitting down "on that bone" still hurts. She states the R knee still feels like pulling on the medial side. She states she has been having more cramps into her LE at night.   PAIN:  Are you having pain? Yes VAS scale: 4/10 Pain location: L hip posterior and R knee Pain orientation: Left and R PAIN TYPE: aching, sharp, and throbbing Pain description: intermittent    OBJECTIVE: TTP of L gluteals and hip rotators. Hypertonicity of L HS group. Excessive cramping with bridge   Today's Treatment:    TODAY-09/21 Manual:  STM L hip rotators/gluteals and L HS  Exercise:   LAQ 2x10 2lbs Clamshell GTB at knees 3s 2x10 Supine Bridge -  2 sets - 10 reps - 2 hold (cramping, held today) Supine heel slide with strap 10x 5s Seated HS stretch 30s 3x on bilat STS with green band around the knees thigh height table  2x10  09/21 Manual:  STM L hip rotators/gluteals and L HS  Exercise:  LAQ 2x10 no weight Hip ABD iso in supine- GTB at knees 5s 2x10 Supine Bridge -  sets - 10 reps - 2 hold Supine heel slide with strap 15x 5x Seated Ankle Dorsiflexion Stretch - 2 1 sets - 3 reps - 20 hold Seated HS stretch 30s 3x on L STS thigh height table 2x10   09/14 Manual:  STM L hip rotators/gluteals   Exercise:  LAQ 2x10 no weight Hip ABD iso in supine- RTB at knees 5s 2x10 Hooklying Single Knee to Chest Stretch - 3 reps - 30 hold (on hold due to pain) Supine Quad Set   15x 5 hold Supine Bridge -  sets - 10 reps - 2 hold Seated Ankle Dorsiflexion Stretch - 2 1 sets - 3 reps - 20  hold    PATIENT EDUCATION:  Education details: anatomy, exercise progression, DOMS expectations, muscle firing, HEP, envelope of function,  Person educated: Patient Education method: Explanation, Demonstration, Tactile cues, Verbal cues, and Handouts Education comprehension: verbalized understanding, returned demonstration, verbal cues required, and tactile cues required     HOME EXERCISE PROGRAM: Access Code: [email or web address]    ASSESSMENT:   CLINICAL IMPRESSION: Pt had decreased TTP and resting muscle tone as compared to last session, but still had local twitch responses elicited with manual therapy into L hip and HS. Pt was able to continue with CKC exercise and improving WB with near symmetrical stance time during gait. Pt session used to review previous exercise with increase in intensity with  clam, STS, and LAQ. HEP updated accordingly. Pt  will benefit from skilled PT to address above impairments and improve overall function.   REHAB POTENTIAL: Good   CLINICAL DECISION MAKING: Stable/uncomplicated   EVALUATION COMPLEXITY: Low     GOALS:     SHORT TERM GOALS:   STG Name Target Date Goal status  1 Pt will become independent with HEP in order to demonstrate synthesis of PT education.   04/21/2021   INITIAL  2 Pt will have an at least 9 pt improvement in LEFS measure in order to demonstrate MCID improvement in daily function. Baseline: 33.8% 04/21/2021  INITIAL  3 Pt will be able to demonstrate ability to STS without UE and pain in order to demonstrate functional improvement in bilat LE function for self-care and house hold duties.   04/21/2021 INITIAL  4 Pt will be able to report a decrease of hip and knee pain by >/= 2/10 at rest in order to demonstrate functional improvement in L hip and R knee pain.   04/21/2021 INITIAL    LONG TERM GOALS:    LTG Name Target Date Goal status  1 Pt  will become independent with final HEP in order to demonstrate synthesis of PT education.    06/02/2021   INITIAL  2 Pt will be able to perform 5XSTS in under 12s  in order to demonstrate functional improvement above the cut off score for adults.   06/02/2021 INITIAL  3 Pt will be able to demonstrate kneeling to stand, and stand to kneeling transfer with UE support in order to demonstrate functional improvement in LE function for ADL/house hold duties.   06/02/2021 INITIAL  4 Pt will be able to demonstrate/report ability to walk >30 mins without pain in order to demonstrate functional improvement and tolerance to exercise and community mobility.   06/02/2021 INITIAL    PLAN: PT FREQUENCY: 1-2x/week  PT DURATION: 6-8 weeks   PLANNED INTERVENTIONS: Therapeutic exercises, Therapeutic activity, Neuro Muscular re-education, Balance training, Gait training, Patient/Family education, Joint mobilization, Orthotic/Fit training, Aquatic Therapy, Dry Needling, Electrical stimulation, Cryotherapy, Moist heat, scar mobilization, Taping, Vasopneumatic device, Traction, Ultrasound, Ionotophoresis 4mg /ml Dexamethasone, and Manual therapy   PLAN FOR NEXT SESSION:  lower box squat, STM to L hip and HS, standing hip ABD   Daleen Bo PT, DPT 04/23/21 4:41 PM     [facility] [address] [place], [place], [address] Phone: [phone]   Fax:  [phone]  Patient name: Danielle Shaw MRN: [id number] DOB: 1970/10/13

## 2021-04-25 ENCOUNTER — Encounter (HOSPITAL_BASED_OUTPATIENT_CLINIC_OR_DEPARTMENT_OTHER): Payer: Self-pay | Admitting: Physical Therapy

## 2021-04-25 ENCOUNTER — Other Ambulatory Visit: Payer: Self-pay

## 2021-04-25 ENCOUNTER — Ambulatory Visit (HOSPITAL_BASED_OUTPATIENT_CLINIC_OR_DEPARTMENT_OTHER): Payer: 59 | Admitting: Physical Therapy

## 2021-04-25 DIAGNOSIS — M6281 Muscle weakness (generalized): Secondary | ICD-10-CM

## 2021-04-25 DIAGNOSIS — M25561 Pain in right knee: Secondary | ICD-10-CM | POA: Diagnosis not present

## 2021-04-25 DIAGNOSIS — R262 Difficulty in walking, not elsewhere classified: Secondary | ICD-10-CM

## 2021-04-25 NOTE — Therapy (Signed)
OUTPATIENT PHYSICAL THERAPY TREATMENT NOTE   Patient Name: Danielle Shaw MRN: [id number] DOB:March 08, 1971, 50 y.o., female Today's Date: 04/25/2021  PCP: Isaac Bliss, Rayford Halsted, MD REFERRING PROVIDER: Isaac Bliss, Estel*    PT End of Session - 04/25/21 1010     Visit Number 5    Number of Visits 13    Date for PT Re-Evaluation 07/06/21    Authorization Type UHC    PT Start Time 1010    PT Stop Time 1050    PT Time Calculation (min) 40 min    Activity Tolerance Patient tolerated treatment well    Behavior During Therapy [facility] for tasks assessed/performed               Past Medical History:  Diagnosis Date   Arrhythmia    Arthritis    GERD (gastroesophageal reflux disease)    Hyperlipidemia    Hypertension    IUD 2004   IUD 2009   Migraine    Palpitations    Sleep apnea    SOB (shortness of breath)    Past Surgical History:  Procedure Laterality Date   BREAST BIOPSY Right 2011   BREAST SURGERY  2000   reduction   CESAREAN SECTION     COLONOSCOPY WITH PROPOFOL N/A 07/11/2018   Procedure: COLONOSCOPY WITH PROPOFOL;  Surgeon: Thornton Park, MD;  Location: [facility];  Service: Gastroenterology;  Laterality: N/A;   Patient Active Problem List   Diagnosis Date Noted   Neck pain on left side 05/19/2018   Blood in stool 05/19/2018   Circadian rhythm sleep disorder, shift work type 03/30/2018   Obstructive sleep apnea hypopnea, severe 03/30/2018   Carpal tunnel syndrome on both sides 03/30/2018   SI (sacroiliac) joint dysfunction 01/17/2018   Left flank pain 01/07/2018   Sleep paralysis, recurrent isolated 10/20/2017   Complaint related to dreams 10/20/2017   Arthralgia 12/26/2016   Allergic rhinitis 05/03/2015   Right knee pain 04/27/2013   Annual physical exam 07/15/2012   Presence of intrauterine contraceptive device (IUD)    Morbid obesity ([place]) 03/07/2007   Hyperlipidemia 09/23/2006   MIGRAINE, UNSPEC., W/O INTRACTABLE MIGRAINE  09/23/2006   HYPERTENSION, BENIGN SYSTEMIC 09/23/2006    ONSET DATE: May 2022   REFERRING DIAG: M25.561 (ICD-10-CM) - Acute pain of right knee M70.72 (ICD-10-CM) - Ischial bursitis of left side M76.892 (ICD-10-CM) - Hamstring tendinitis of left thigh    THERAPY DIAG:  Pain in joint of right knee   Muscle weakness (generalized)   Difficulty walking  SUBJECTIVE: Pt states she has had minimal pain since last session. She states she was sore for about a day but now it is better. She had no issues with new HEP.   PAIN:  Are you having pain? No VAS scale: 0/10    OBJECTIVE: hypertonicity of L HS group and L upper glute, twitch response elicited in L bicep femoris  Today's Treatment:    TODAY-   09/21 Manual:  STM L hip rotators/gluteals and L HS  Exercise:   Knee extension machine 2x10 20lbs equal WB Supine Bridge -  2x10 2s hold White leg press 2x 10 start from 80 deg knee flexion 50lbs, (2nd set 60/40 bias on R) Supine heel slide with strap 10x 5s Seated HS stretch 30s 3x on bilat STS with 5lb KB thigh height table 2x10  09/21 Manual:  STM L hip rotators/gluteals and L HS  Exercise:  LAQ 2x10 no weight Hip ABD iso in supine- GTB at knees  5s 2x10 Supine Bridge -  sets - 10 reps - 2 hold Supine heel slide with strap 15x 5x Seated Ankle Dorsiflexion Stretch - 2 1 sets - 3 reps - 20 hold Seated HS stretch 30s 3x on L STS thigh height table 2x10     PATIENT EDUCATION:  Education details: anatomy, exercise progression, DOMS expectations, muscle firing, HEP, analgesic effect with exercise Person educated: Patient Education method: Explanation, Demonstration, Tactile cues, Verbal cues, and Handouts Education comprehension: verbalized understanding, returned demonstration, verbal cues required, and tactile cues required     HOME EXERCISE PROGRAM: Access Code: [email or web address]    ASSESSMENT:   CLINICAL IMPRESSION: Pt responding well to progressive loading of L hip and  R knee without increase in pain. Pt able to introduce light resistance in addition to BW in order to continue with strength and utilizing isometric analgesic effect. Pt with good performance during knee AROM exercise and able to bias R knee loading. R Knee A/PROM symmetrical with L by end of session. Pt still with difficulty with functional transfers from low heights and sitting for long periods due to L hip pain. Pt  will benefit from skilled PT to address above impairments and improve overall function.   REHAB POTENTIAL: Good   CLINICAL DECISION MAKING: Stable/uncomplicated   EVALUATION COMPLEXITY: Low     GOALS:     SHORT TERM GOALS:   STG Name Target Date Goal status  1 Pt will become independent with HEP in order to demonstrate synthesis of PT education.   04/21/2021   INITIAL  2 Pt will have an at least 9 pt improvement in LEFS measure in order to demonstrate MCID improvement in daily function. Baseline: 33.8% 04/21/2021  INITIAL  3 Pt will be able to demonstrate ability to STS without UE and pain in order to demonstrate functional improvement in bilat LE function for self-care and house hold duties.   04/21/2021 INITIAL  4 Pt will be able to report a decrease of hip and knee pain by >/= 2/10 at rest in order to demonstrate functional improvement in L hip and R knee pain.   04/21/2021 INITIAL    LONG TERM GOALS:    LTG Name Target Date Goal status  1 Pt  will become independent with final HEP in order to demonstrate synthesis of PT education.   06/02/2021   INITIAL  2 Pt will be able to perform 5XSTS in under 12s  in order to demonstrate functional improvement above the cut off score for adults.   06/02/2021 INITIAL  3 Pt will be able to demonstrate kneeling to stand, and stand to kneeling transfer with UE support in order to demonstrate functional improvement in LE function for ADL/house hold duties.   06/02/2021 INITIAL  4 Pt will be able to demonstrate/report ability to walk >30  mins without pain in order to demonstrate functional improvement and tolerance to exercise and community mobility.   06/02/2021 INITIAL    PLAN: PT FREQUENCY: 1-2x/week   PT DURATION: 6-8 weeks   PLANNED INTERVENTIONS: Therapeutic exercises, Therapeutic activity, Neuro Muscular re-education, Balance training, Gait training, Patient/Family education, Joint mobilization, Orthotic/Fit training, Aquatic Therapy, Dry Needling, Electrical stimulation, Cryotherapy, Moist heat, scar mobilization, Taping, Vasopneumatic device, Traction, Ultrasound, Ionotophoresis 4mg /ml Dexamethasone, and Manual therapy   PLAN FOR NEXT SESSION:  lower box squat, increase leg press weight, SLR ABD, standing hip ABD   Daleen Bo PT, DPT 04/25/21 10:47 AM     [place] [phone]  [place], [place], [address] Phone: [phone]   Fax:  [phone]  Patient name: Danielle Shaw MRN: [id number] DOB: 1971/05/17

## 2021-04-30 ENCOUNTER — Ambulatory Visit (HOSPITAL_BASED_OUTPATIENT_CLINIC_OR_DEPARTMENT_OTHER): Payer: 59 | Attending: Family Medicine | Admitting: Physical Therapy

## 2021-04-30 DIAGNOSIS — M25561 Pain in right knee: Secondary | ICD-10-CM | POA: Insufficient documentation

## 2021-04-30 DIAGNOSIS — R262 Difficulty in walking, not elsewhere classified: Secondary | ICD-10-CM | POA: Insufficient documentation

## 2021-04-30 DIAGNOSIS — M6281 Muscle weakness (generalized): Secondary | ICD-10-CM | POA: Insufficient documentation

## 2021-05-05 ENCOUNTER — Encounter (HOSPITAL_BASED_OUTPATIENT_CLINIC_OR_DEPARTMENT_OTHER): Payer: Self-pay | Admitting: Physical Therapy

## 2021-05-05 ENCOUNTER — Other Ambulatory Visit: Payer: Self-pay

## 2021-05-05 ENCOUNTER — Ambulatory Visit (HOSPITAL_BASED_OUTPATIENT_CLINIC_OR_DEPARTMENT_OTHER): Payer: 59 | Admitting: Physical Therapy

## 2021-05-05 DIAGNOSIS — R262 Difficulty in walking, not elsewhere classified: Secondary | ICD-10-CM

## 2021-05-05 DIAGNOSIS — M6281 Muscle weakness (generalized): Secondary | ICD-10-CM | POA: Diagnosis present

## 2021-05-05 DIAGNOSIS — M25561 Pain in right knee: Secondary | ICD-10-CM

## 2021-05-05 NOTE — Therapy (Signed)
OUTPATIENT PHYSICAL THERAPY TREATMENT NOTE   Patient Name: Danielle Shaw MRN: [id number] DOB:1970/09/30, 50 y.o., female Today's Date: 05/05/2021  PCP: Isaac Bliss, Rayford Halsted, MD REFERRING PROVIDER: Isaac Bliss, Estel*    PT End of Session - 05/05/21 1526     Visit Number 6    Number of Visits 13    Date for PT Re-Evaluation 07/06/21    Authorization Type UHC    PT Start Time 1520    PT Stop Time 1600    PT Time Calculation (min) 40 min    Activity Tolerance Patient tolerated treatment well    Behavior During Therapy [facility] for tasks assessed/performed               Past Medical History:  Diagnosis Date   Arrhythmia    Arthritis    GERD (gastroesophageal reflux disease)    Hyperlipidemia    Hypertension    IUD 2004   IUD 2009   Migraine    Palpitations    Sleep apnea    SOB (shortness of breath)    Past Surgical History:  Procedure Laterality Date   BREAST BIOPSY Right 2011   BREAST SURGERY  2000   reduction   CESAREAN SECTION     COLONOSCOPY WITH PROPOFOL N/A 07/11/2018   Procedure: COLONOSCOPY WITH PROPOFOL;  Surgeon: Thornton Park, MD;  Location: [facility];  Service: Gastroenterology;  Laterality: N/A;   Patient Active Problem List   Diagnosis Date Noted   Neck pain on left side 05/19/2018   Blood in stool 05/19/2018   Circadian rhythm sleep disorder, shift work type 03/30/2018   Obstructive sleep apnea hypopnea, severe 03/30/2018   Carpal tunnel syndrome on both sides 03/30/2018   SI (sacroiliac) joint dysfunction 01/17/2018   Left flank pain 01/07/2018   Sleep paralysis, recurrent isolated 10/20/2017   Complaint related to dreams 10/20/2017   Arthralgia 12/26/2016   Allergic rhinitis 05/03/2015   Right knee pain 04/27/2013   Annual physical exam 07/15/2012   Presence of intrauterine contraceptive device (IUD)    Morbid obesity ([place]) 03/07/2007   Hyperlipidemia 09/23/2006   MIGRAINE, UNSPEC., W/O INTRACTABLE MIGRAINE  09/23/2006   HYPERTENSION, BENIGN SYSTEMIC 09/23/2006    ONSET DATE: May 2022   REFERRING DIAG: M25.561 (ICD-10-CM) - Acute pain of right knee M70.72 (ICD-10-CM) - Ischial bursitis of left side M76.892 (ICD-10-CM) - Hamstring tendinitis of left thigh    THERAPY DIAG:  Pain in joint of right knee   Muscle weakness (generalized)   Difficulty walking  SUBJECTIVE: Pt states that the L hip and R knee are more painful today. She states she has been working more and standing up more at work with using stairs. She feels more pulling in the R knee and the L hip hurts when rolling in bed.   PAIN:  PAIN:  Are you having pain? Yes VAS scale: 5/10 R knee, 7/10 L hip PAIN TYPE: aching and throbbing Pain description: intermittent  Aggravating factors: WB, standing, stairs  OBJECTIVE: hypertonicity of L HS group and L upper glute, twitch response elicited in L bicep femoris, and R quad  Today's Treatment:    TODAY-  10/10  Manual:  STM L hip rotators/gluteals and L HS, R quad   Exercise:   Supine half bridge (focus on glute setting) 5s 2x10 Knee extension machine 2x10 20lbs equal WB LTR 3s 10x each Supine hip ABD GTB at knees 5s 15x Seated HS stretch 30s 3x on bilat   09/21 Manual:  STM L hip rotators/gluteals and L HS  Exercise:   Knee extension machine 2x10 20lbs equal WB Supine Bridge -  2x10 2s hold White leg press 2x 10 start from 80 deg knee flexion 50lbs, (2nd set 60/40 bias on R) Supine heel slide with strap 10x 5s Seated HS stretch 30s 3x on bilat STS with 5lb KB thigh height table 2x10  09/21 Manual:  STM L hip rotators/gluteals and L HS  Exercise:  LAQ 2x10 no weight Hip ABD iso in supine- GTB at knees 5s 2x10 Supine Bridge -  sets - 10 reps - 2 hold Supine heel slide with strap 15x 5x Seated Ankle Dorsiflexion Stretch - 2 1 sets - 3 reps - 20 hold Seated HS stretch 30s 3x on L STS thigh height table 2x10     PATIENT EDUCATION:  Education details:  anatomy, exercise progression, DOMS expectations, muscle firing, HEP, analgesic effect with exercise Person educated: Patient Education method: Explanation, Demonstration, Tactile cues, Verbal cues, and Handouts Education comprehension: verbalized understanding, returned demonstration, verbal cues required, and tactile cues required     HOME EXERCISE PROGRAM: Access Code: [email or web address]    ASSESSMENT:   CLINICAL IMPRESSION: Pt presents with increased pain into L hip and R knee at today's session following exacerbation with increased time spent on feet at work/stairs/ramps. Pt with increased L hip and R quad hypertonicity and pain. Pt was able to perform continued isometric exercise and stretching without increase in pain. Pt without signficant improvement in pain by end of session but had improved L LE stance time during gait. Plan to continue with strength as tolerated following pt out of town and short period of rest/offloading. Pt  will benefit from skilled PT to address above impairments and improve overall function.   REHAB POTENTIAL: Good   CLINICAL DECISION MAKING: Stable/uncomplicated   EVALUATION COMPLEXITY: Low     GOALS:     SHORT TERM GOALS:   STG Name Target Date Goal status  1 Pt will become independent with HEP in order to demonstrate synthesis of PT education.   04/21/2021   INITIAL  2 Pt will have an at least 9 pt improvement in LEFS measure in order to demonstrate MCID improvement in daily function. Baseline: 33.8% 04/21/2021  INITIAL  3 Pt will be able to demonstrate ability to STS without UE and pain in order to demonstrate functional improvement in bilat LE function for self-care and house hold duties.   04/21/2021 INITIAL  4 Pt will be able to report a decrease of hip and knee pain by >/= 2/10 at rest in order to demonstrate functional improvement in L hip and R knee pain.   04/21/2021 INITIAL    LONG TERM GOALS:    LTG Name Target Date Goal status  1 Pt  will  become independent with final HEP in order to demonstrate synthesis of PT education.   06/02/2021   INITIAL  2 Pt will be able to perform 5XSTS in under 12s  in order to demonstrate functional improvement above the cut off score for adults.   06/02/2021 INITIAL  3 Pt will be able to demonstrate kneeling to stand, and stand to kneeling transfer with UE support in order to demonstrate functional improvement in LE function for ADL/house hold duties.   06/02/2021 INITIAL  4 Pt will be able to demonstrate/report ability to walk >30 mins without pain in order to demonstrate functional improvement and tolerance to exercise and community mobility.   06/02/2021 INITIAL  PLAN: PT FREQUENCY: 1-2x/week   PT DURATION: 6-8 weeks   PLANNED INTERVENTIONS: Therapeutic exercises, Therapeutic activity, Neuro Muscular re-education, Balance training, Gait training, Patient/Family education, Joint mobilization, Orthotic/Fit training, Aquatic Therapy, Dry Needling, Electrical stimulation, Cryotherapy, Moist heat, scar mobilization, Taping, Vasopneumatic device, Traction, Ultrasound, Ionotophoresis 4mg /ml Dexamethasone, and Manual therapy   PLAN FOR NEXT SESSION:  lower box squat, increase leg press weight, SLR ABD, standing hip ABD   Daleen Bo PT, DPT 05/05/21 4:46 PM     [facility] [address] [place], [place], [address] Phone: [phone]   Fax:  [phone]  Patient name: Danielle Shaw MRN: [id number] DOB: June 08, 1971

## 2021-05-06 ENCOUNTER — Other Ambulatory Visit: Payer: Self-pay

## 2021-05-06 ENCOUNTER — Encounter (INDEPENDENT_AMBULATORY_CARE_PROVIDER_SITE_OTHER): Payer: Self-pay | Admitting: Bariatrics

## 2021-05-06 ENCOUNTER — Ambulatory Visit (INDEPENDENT_AMBULATORY_CARE_PROVIDER_SITE_OTHER): Payer: 59 | Admitting: Bariatrics

## 2021-05-06 VITALS — BP 138/95 | HR 69 | Temp 98.0°F | Ht 65.0 in | Wt 303.0 lb

## 2021-05-06 DIAGNOSIS — R7303 Prediabetes: Secondary | ICD-10-CM | POA: Diagnosis not present

## 2021-05-06 DIAGNOSIS — Z6841 Body Mass Index (BMI) 40.0 and over, adult: Secondary | ICD-10-CM

## 2021-05-06 DIAGNOSIS — R252 Cramp and spasm: Secondary | ICD-10-CM

## 2021-05-06 DIAGNOSIS — E559 Vitamin D deficiency, unspecified: Secondary | ICD-10-CM

## 2021-05-06 MED ORDER — VITAMIN D (ERGOCALCIFEROL) 1.25 MG (50000 UNIT) PO CAPS: 50000.0000 [IU] | ORAL_CAPSULE | ORAL | 0 refills | Status: DC

## 2021-05-06 MED ORDER — TIRZEPATIDE 5 MG/0.5ML ~~LOC~~ SOAJ: 5.0000 mg | SUBCUTANEOUS | 0 refills | Status: DC

## 2021-05-06 NOTE — Progress Notes (Signed)
Chief Complaint:   OBESITY Danielle Shaw is here to discuss her progress with her obesity treatment plan along with follow-up of her obesity related diagnoses. Danielle Shaw is on keeping a food journal and adhering to recommended goals of 1350-1500 calories and 90+ grams of protein and states she is following her eating plan approximately 70% of the time. Danielle Shaw states she is doing physical therapy for 45 minutes 1 times per week.  Today's visit was #: 3 Starting weight: 312 lbs Starting date: 04/08/2021 Today's weight: 303 lbs Today's date: 05/06/2021 Total lbs lost to date: 9 lbs Total lbs lost since last in-office visit: 5 lbs  Interim History: Danielle Shaw is down 5 lbs since her last visit.   Subjective:   1. Pre-diabetes Danielle Shaw is currently taking Mounjaro decreased appetite at 2.5 mg. She denies side effects. Her last A1C was 6.3. Her last Insulin was 20.3.  2. Vitamin D deficiency She is currently taking prescription vitamin D 50,000 IU each week. She denies nausea, vomiting or muscle weakness.  3. Leg cramps Danielle Shaw notes leg cramps which are   Assessment/Plan:   1. Pre-diabetes Danielle Shaw will continue to work on weight loss, exercise, and decreasing simple carbohydrates to help decrease the risk of diabetes.  We will refill Mounjaro 5 mg for 1 month with no refills.   - tirzepatide [facility]) 5 MG/0.5ML Pen; Inject 5 mg into the skin once a week.  Dispense: 2 mL; Refill: 0  2. Vitamin D deficiency Low Vitamin D level contributes to fatigue and are associated with obesity, breast, and colon cancer. We will refill prescription Vitamin D 50,000 IU every week for 1 month with no refills and Danielle Shaw will follow-up for routine testing of Vitamin D, at least 2-3 times per year to avoid over-replacement.  - Vitamin D, Ergocalciferol, (DRISDOL) 1.25 MG (50000 UNIT) CAPS capsule; Take 1 capsule (50,000 Units total) by mouth every 7 (seven) days.  Dispense: 4 capsule; Refill: 0  3. Leg cramps Danielle Shaw  agrees to begin taking Magnesium 300-400 mg OTC. She will increase her water intake.   4. Obesity with current BMI of 50.6 Danielle Shaw is currently in the action stage of change. As such, her goal is to continue with weight loss efforts. She has agreed to keeping a food journal and adhering to recommended goals of 1350-1500 calories and 90+ grams of  protein.   Danielle Shaw will continue meal planning and intentional eating. We reviewed labs from 04/08/2021.  Exercise goals:  As is.  Behavioral modification strategies: increasing lean protein intake, decreasing simple carbohydrates, increasing vegetables, increasing water intake, decreasing eating out, no skipping meals, meal planning and cooking strategies, keeping healthy foods in the home, and planning for success.  Danielle Shaw has agreed to follow-up with our clinic in 2-3 weeks with Dr. Jearld Shines. She was informed of the importance of frequent follow-up visits to maximize her success with intensive lifestyle modifications for her multiple health conditions.   Objective:   Blood pressure (!) 138/95, pulse 69, temperature 98 F (36.7 C), height 5\' 5"  (1.651 m), weight (!) 303 lb (137.4 kg), SpO2 99 %. Body mass index is 50.42 kg/m.  General: Cooperative, alert, well developed, in no acute distress. HEENT: Conjunctivae and lids unremarkable. Cardiovascular: Regular rhythm.  Lungs: Normal work of breathing. Neurologic: No focal deficits.   Lab Results  Component Value Date   CREATININE 0.77 04/08/2021   BUN 15 04/08/2021   NA 139 04/08/2021   K 4.2 04/08/2021   CL 99 04/08/2021  CO2 26 04/08/2021   Lab Results  Component Value Date   ALT 23 04/08/2021   AST 19 04/08/2021   ALKPHOS 133 (H) 04/08/2021   BILITOT 0.5 04/08/2021   Lab Results  Component Value Date   HGBA1C 6.3 (H) 04/08/2021   HGBA1C 6.0 (H) 09/27/2018   HGBA1C 6.0 (H) 03/17/2018   HGBA1C 6.2 (H) 12/08/2017   HGBA1C 6.4 12/24/2016   Lab Results  Component Value Date    INSULIN 20.3 04/08/2021   INSULIN 22.0 09/27/2018   INSULIN 16.3 03/17/2018   INSULIN 20.9 12/08/2017   Lab Results  Component Value Date   TSH 1.180 04/08/2021   Lab Results  Component Value Date   CHOL 197 04/08/2021   HDL 49 04/08/2021   LDLCALC 133 (H) 04/08/2021   LDLDIRECT 177 (H) 10/28/2010   TRIG 84 04/08/2021   CHOLHDL 7 12/24/2016   Lab Results  Component Value Date   VD25OH 25.2 (L) 04/08/2021   VD25OH 32.4 09/27/2018   VD25OH 29.3 (L) 03/17/2018   Lab Results  Component Value Date   WBC 5.2 04/08/2021   HGB 13.4 04/08/2021   HCT 41.6 04/08/2021   MCV 89 04/08/2021   PLT 336 04/08/2021   No results found for: IRON, TIBC, FERRITIN  Attestation Statements:   Reviewed by clinician on day of visit: allergies, medications, problem list, medical history, surgical history, family history, social history, and previous encounter notes.  I, Lizbeth Bark, RMA, am acting as Location manager for [employer], DO.   I have reviewed the above documentation for accuracy and completeness, and I agree with the above. Jearld Lesch, DO

## 2021-05-07 ENCOUNTER — Encounter (INDEPENDENT_AMBULATORY_CARE_PROVIDER_SITE_OTHER): Payer: Self-pay | Admitting: Bariatrics

## 2021-05-08 ENCOUNTER — Ambulatory Visit: Payer: 59 | Admitting: Family Medicine

## 2021-05-12 ENCOUNTER — Telehealth: Payer: Self-pay | Admitting: Internal Medicine

## 2021-05-12 DIAGNOSIS — I10 Essential (primary) hypertension: Secondary | ICD-10-CM

## 2021-05-12 NOTE — Telephone Encounter (Signed)
Pt is calling and needs new rx's from dr Jerilee Hoh these medication was last refill by her old provider. Pt would like #90 of each medications with refill losartan 100 mg, rosuvastatin 20 mg and spironolactone 25 mg   [place], [place]. Phone:  [phone]  Fax:  [phone]

## 2021-05-13 ENCOUNTER — Ambulatory Visit: Payer: No Typology Code available for payment source | Admitting: Family Medicine

## 2021-05-13 NOTE — Telephone Encounter (Signed)
Okay to refill? 

## 2021-05-14 MED ORDER — ROSUVASTATIN CALCIUM 20 MG PO TABS: 20.0000 mg | ORAL_TABLET | Freq: Every day | ORAL | 1 refills | Status: DC

## 2021-05-14 MED ORDER — SPIRONOLACTONE 25 MG PO TABS: 25.0000 mg | ORAL_TABLET | Freq: Every day | ORAL | 1 refills | Status: DC

## 2021-05-14 MED ORDER — LOSARTAN POTASSIUM 100 MG PO TABS: 100.0000 mg | ORAL_TABLET | Freq: Every day | ORAL | 1 refills | Status: DC

## 2021-05-14 NOTE — Telephone Encounter (Signed)
Medications refilled to pharmacy requested. 

## 2021-05-19 ENCOUNTER — Encounter (HOSPITAL_BASED_OUTPATIENT_CLINIC_OR_DEPARTMENT_OTHER): Payer: 59 | Admitting: Physical Therapy

## 2021-05-19 ENCOUNTER — Ambulatory Visit: Payer: 59 | Admitting: Family Medicine

## 2021-05-19 NOTE — Progress Notes (Deleted)
   I, Peterson Lombard, LAT, ATC acting as a scribe for Lynne Leader, MD.  Danielle Shaw is a 50 y.o. female who presents to [place] [facility] today for f/u R knee pain and bilat ischial tuberosity pain. Pt was last seen by Dr. Georgina Snell on 04/01/21 and was given a R knee steroid injection and advised to use Voltaren gel and work on weight loss. Pt was also referred to PT and has completed 6 visits. Today, pt reports  Dx imaging: 01/06/21 Bilat knee XR (@ [facility]) 12/24/16 R knee XR             05/04/13 R knee XR  Pertinent review of systems: ***  Relevant historical information: ***   Exam:  There were no vitals taken for this visit. General: Well Developed, well nourished, and in no acute distress.   MSK: ***    Lab and Radiology Results No results found for this or any previous visit (from the past 72 hour(s)). No results found.     Assessment and Plan: 50 y.o. female with ***   PDMP not reviewed this encounter. No orders of the defined types were placed in this encounter.  No orders of the defined types were placed in this encounter.    Discussed warning signs or symptoms. Please see discharge instructions. Patient expresses understanding.   ***

## 2021-05-20 ENCOUNTER — Encounter (HOSPITAL_BASED_OUTPATIENT_CLINIC_OR_DEPARTMENT_OTHER): Payer: Self-pay | Admitting: Physical Therapy

## 2021-05-20 ENCOUNTER — Other Ambulatory Visit: Payer: Self-pay

## 2021-05-20 ENCOUNTER — Encounter (HOSPITAL_BASED_OUTPATIENT_CLINIC_OR_DEPARTMENT_OTHER): Payer: 59 | Attending: Physical Therapy | Admitting: Physical Therapy

## 2021-05-20 ENCOUNTER — Encounter (INDEPENDENT_AMBULATORY_CARE_PROVIDER_SITE_OTHER): Payer: Self-pay

## 2021-05-20 DIAGNOSIS — M25552 Pain in left hip: Secondary | ICD-10-CM | POA: Insufficient documentation

## 2021-05-20 DIAGNOSIS — R262 Difficulty in walking, not elsewhere classified: Secondary | ICD-10-CM | POA: Diagnosis present

## 2021-05-20 DIAGNOSIS — M6281 Muscle weakness (generalized): Secondary | ICD-10-CM | POA: Insufficient documentation

## 2021-05-20 DIAGNOSIS — M25561 Pain in right knee: Secondary | ICD-10-CM | POA: Diagnosis not present

## 2021-05-20 NOTE — Therapy (Addendum)
OUTPATIENT PHYSICAL THERAPY TREATMENT NOTE  PHYSICAL THERAPY DISCHARGE SUMMARY  Visits from Start of Care: 7  Plan: Patient agrees to discharge.  Patient goals were not met. Patient is being discharged due to not returning to therapy.       Patient Name: Danielle Shaw MRN: [id number] DOB:09-May-1971, 50 y.o., female Today's Date: 05/20/2021  PCP: Isaac Bliss, Rayford Halsted, MD REFERRING PROVIDER: Isaac Bliss, [place]*    PT End of Session - 05/20/21 1601     Visit Number 7    Number of Visits 13    Date for PT Re-Evaluation 07/06/21    Authorization Type [employer]    PT Start Time 1520    PT Stop Time 1600    PT Time Calculation (min) 40 min    Activity Tolerance Patient tolerated treatment well    Behavior During Therapy [facility] for tasks assessed/performed                Past Medical History:  Diagnosis Date   Arrhythmia    Arthritis    GERD (gastroesophageal reflux disease)    Hyperlipidemia    Hypertension    IUD 2004   IUD 2009   Migraine    Palpitations    Sleep apnea    SOB (shortness of breath)    Past Surgical History:  Procedure Laterality Date   BREAST BIOPSY Right 2011   BREAST SURGERY  2000   reduction   CESAREAN SECTION     COLONOSCOPY WITH PROPOFOL N/A 07/11/2018   Procedure: COLONOSCOPY WITH PROPOFOL;  Surgeon: Thornton Park, MD;  Location: [facility];  Service: Gastroenterology;  Laterality: N/A;   Patient Active Problem List   Diagnosis Date Noted   Neck pain on left side 05/19/2018   Blood in stool 05/19/2018   Circadian rhythm sleep disorder, shift work type 03/30/2018   Obstructive sleep apnea hypopnea, severe 03/30/2018   Carpal tunnel syndrome on both sides 03/30/2018   SI (sacroiliac) joint dysfunction 01/17/2018   Left flank pain 01/07/2018   Sleep paralysis, recurrent isolated 10/20/2017   Complaint related to dreams 10/20/2017   Arthralgia 12/26/2016   Allergic rhinitis 05/03/2015   Right knee pain 04/27/2013    Annual physical exam 07/15/2012   Presence of intrauterine contraceptive device (IUD)    Morbid obesity ([place]) 03/07/2007   Hyperlipidemia 09/23/2006   MIGRAINE, UNSPEC., W/O INTRACTABLE MIGRAINE 09/23/2006   HYPERTENSION, BENIGN SYSTEMIC 09/23/2006    ONSET DATE: May 2022   REFERRING DIAG: M25.561 (ICD-10-CM) - Acute pain of right knee M70.72 (ICD-10-CM) - Ischial bursitis of left side M76.892 (ICD-10-CM) - Hamstring tendinitis of left thigh    THERAPY DIAG:  Pain in joint of right knee   Muscle weakness (generalized)   Difficulty walking  SUBJECTIVE: Pt states that 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 Getting down/out of the higher forklift made it more painful. She states it happened about Wedneday or Thursday of last week. She feels that the knee was giving out at random times. Pt locates it to the medial joint line. She also describes it as pulling into the R quad. The hip was also aggravated by the climbing up and down the forklift.  PAIN:  PAIN:  Are you having pain? Yes VAS scale: 8/10 R knee, 8/10 hip PAIN TYPE: aching and throbbing Pain description: intermittent  Aggravating factors: WB, standing, stairs  OBJECTIVE:  twitch response elicited in L bicep femoris, and R quad, joint line  tenderness of R knee with crepitus  Today's Treatment:    TODAY-   10/25 Manual:  STM L hip rotators/gluteals and L HS, R quad   Exercise:   Supine quad set 5s 10x   Supine half roll, L knee bent, 2x10  Nu step level 1 5 min for knee ROM  Standing gastroc stretch 30s 2x  Seated HS      10/10  Manual:  STM L hip rotators/gluteals and L HS, R quad   Exercise:   Supine half bridge (focus on glute setting) 5s 2x10 Knee extension machine 2x10 20lbs equal WB LTR 3s 10x each Supine hip ABD GTB at knees 5s 15x Seated HS stretch 30s 3x on bilat   09/21 Manual:  STM L hip rotators/gluteals and L HS  Exercise:   Knee extension machine  2x10 20lbs equal WB Supine Bridge -  2x10 2s hold White leg press 2x 10 start from 80 deg knee flexion 50lbs, (2nd set 60/40 bias on R) Supine heel slide with strap 10x 5s Seated HS stretch 30s 3x on bilat STS with 5lb KB thigh height table 2x10      PATIENT EDUCATION:  Education details: anatomy, exercise progression, DOMS expectations, muscle firing, HEP, analgesic effect with exercise Person educated: Patient Education method: Explanation, Demonstration, Tactile cues, Verbal cues, and Handouts Education comprehension: verbalized understanding, returned demonstration, verbal cues required, and tactile cues required     HOME EXERCISE PROGRAM: Access Code: [email or web address]    ASSESSMENT:   CLINICAL IMPRESSION: Pt presents today with exacerbation of R knee pain and L hip pain following 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 Pt's R knee pain is concerning for exacerbation of internal derangement due to edema, joint line sensitivity, and pain with WB. Pt with unchanged pain at today's session but was able to improve R knee AROM and L LE WB. Pt advised to use thermotherapy and elevation to manage edema as well as consult physician about the use of anti-inflammatories in order to reduce acute inflammatory response. Pt's R ankle also appears to have edema and irritation due to recent impact loading. Plan to improve ROM as able at next session. If no improvement in 1-2 weeks, PT recommends referral back to sports medicine for additional medical management. Pt will benefit from skilled PT to address above impairments and improve overall function.   REHAB POTENTIAL: Good   CLINICAL DECISION MAKING: Stable/uncomplicated   EVALUATION COMPLEXITY: Low     GOALS:     SHORT TERM GOALS:   STG Name Target Date Goal status  1 Pt will become independent with HEP in order to demonstrate synthesis of PT education.   04/21/2021   INITIAL  2 Pt will have an at  least 9 pt improvement in LEFS measure in order to demonstrate MCID improvement in daily function. Baseline: 33.8% 04/21/2021  INITIAL  3 Pt will be able to demonstrate ability to STS without UE and pain in order to demonstrate functional improvement in bilat LE function for self-care and house hold duties.   04/21/2021 INITIAL  4 Pt will be able to report a decrease of hip and knee pain by >/= 2/10 at rest in order to demonstrate functional improvement in L hip and R knee pain.   04/21/2021 INITIAL    LONG TERM GOALS:    LTG Name Target Date Goal status  1 Pt  will become independent with final HEP in order to demonstrate synthesis of PT  education.   06/02/2021   INITIAL  2 Pt will be able to perform 5XSTS in under 12s  in order to demonstrate functional improvement above the cut off score for adults.   06/02/2021 INITIAL  3 Pt will be able to demonstrate kneeling to stand, and stand to kneeling transfer with UE support in order to demonstrate functional improvement in LE function for ADL/house hold duties.   06/02/2021 INITIAL  4 Pt will be able to demonstrate/report ability to walk >30 mins without pain in order to demonstrate functional improvement and tolerance to exercise and community mobility.   06/02/2021 INITIAL    PLAN: PT FREQUENCY: 1-2x/week   PT DURATION: 6-8 weeks   PLANNED INTERVENTIONS: Therapeutic exercises, Therapeutic activity, Neuro Muscular re-education, Balance training, Gait training, Patient/Family education, Joint mobilization, Orthotic/Fit training, Aquatic Therapy, Dry Needling, Electrical stimulation, Cryotherapy, Moist heat, scar mobilization, Taping, Vasopneumatic device, Traction, Ultrasound, Ionotophoresis 60m/ml Dexamethasone, and Manual therapy   PLAN FOR NEXT SESSION:  lower box squat, increase leg press weight, SLR ABD, standing hip ABD   ADaleen BoPT, DPT 05/20/21 4:03 PM    Patient name: TYANELIS OSIKAMRN: [id number]: 3December 05, 1972

## 2021-05-21 ENCOUNTER — Encounter (INDEPENDENT_AMBULATORY_CARE_PROVIDER_SITE_OTHER): Payer: Self-pay | Admitting: Family Medicine

## 2021-05-21 ENCOUNTER — Ambulatory Visit (INDEPENDENT_AMBULATORY_CARE_PROVIDER_SITE_OTHER): Payer: 59 | Admitting: Family Medicine

## 2021-05-21 VITALS — BP 123/63 | HR 72 | Temp 97.7°F | Ht 65.0 in | Wt 305.0 lb

## 2021-05-21 DIAGNOSIS — R7303 Prediabetes: Secondary | ICD-10-CM

## 2021-05-21 DIAGNOSIS — Z6841 Body Mass Index (BMI) 40.0 and over, adult: Secondary | ICD-10-CM

## 2021-05-21 DIAGNOSIS — E559 Vitamin D deficiency, unspecified: Secondary | ICD-10-CM

## 2021-05-21 MED ORDER — TIRZEPATIDE 5 MG/0.5ML ~~LOC~~ SOAJ: 5.0000 mg | SUBCUTANEOUS | 0 refills | Status: DC

## 2021-05-22 NOTE — Progress Notes (Signed)
Chief Complaint:   OBESITY Danielle Shaw is here to discuss her progress with her obesity treatment plan along with follow-up of her obesity related diagnoses. Danielle Shaw is on keeping a food journal and adhering to recommended goals of 1350-1500 calories and 90+ grams protein and states she is following her eating plan approximately 70-80% of the time. Danielle Shaw states she is not currently exercising.  Today's visit was #: 4 Starting weight: 312 lbs Starting date: 04/08/2021 Today's weight: 305 lbs Today's date: 05/21/2021 Total lbs lost to date: 7 Total lbs lost since last in-office visit: 0  Interim History: Danielle Shaw was out of town for about 8 days for work in [place]. She worked long and late hours so she may have skipped a meal. Pt reports no side effects of Mounjaro. She is not able to get all food in, particularly dinner.  Subjective:   1. Pre-diabetes Danielle Shaw is on Mounjaro 5 mg and reports a decline in appetite. She is not always able to get all food in.  2. Vitamin D deficiency Pt denies nausea, vomiting, and muscle weakness but notes fatigue. She is on prescription Vit D.  Assessment/Plan:   1. Pre-diabetes Dmiyah will continue to work on weight loss, exercise, and decreasing simple carbohydrates to help decrease the risk of diabetes.   Refill- tirzepatide (MOUNJARO) 5 MG/0.5ML Pen; Inject 5 mg into the skin once a week.  Dispense: 2 mL; Refill: 0  2. Vitamin D deficiency Low Vitamin D level contributes to fatigue and are associated with obesity, breast, and colon cancer. She agrees to continue to take prescription Vitamin D 50,000 IU every week and will follow-up for routine testing of Vitamin D, at least 2-3 times per year to avoid over-replacement.  3. Obesity with current BMI of 50.8  Danielle Shaw is currently in the action stage of change. As such, her goal is to continue with weight loss efforts. She has agreed to keeping a food journal and adhering to recommended goals of  1350-1500 calories and 90+ grams protein.   Exercise goals: No exercise has been prescribed at this time.  Behavioral modification strategies: increasing lean protein intake, meal planning and cooking strategies, keeping healthy foods in the home, planning for success, and keeping a strict food journal.  Danielle Shaw has agreed to follow-up with our clinic in 2 weeks. She was informed of the importance of frequent follow-up visits to maximize her success with intensive lifestyle modifications for her multiple health conditions.   Objective:   Blood pressure 123/63, pulse 72, temperature 97.7 F (36.5 C), height 5\' 5"  (1.651 m), weight (!) 305 lb (138.3 kg), SpO2 99 %. Body mass index is 50.75 kg/m.  General: Cooperative, alert, well developed, in no acute distress. HEENT: Conjunctivae and lids unremarkable. Cardiovascular: Regular rhythm.  Lungs: Normal work of breathing. Neurologic: No focal deficits.   Lab Results  Component Value Date   CREATININE 0.77 04/08/2021   BUN 15 04/08/2021   NA 139 04/08/2021   K 4.2 04/08/2021   CL 99 04/08/2021   CO2 26 04/08/2021   Lab Results  Component Value Date   ALT 23 04/08/2021   AST 19 04/08/2021   ALKPHOS 133 (H) 04/08/2021   BILITOT 0.5 04/08/2021   Lab Results  Component Value Date   HGBA1C 6.3 (H) 04/08/2021   HGBA1C 6.0 (H) 09/27/2018   HGBA1C 6.0 (H) 03/17/2018   HGBA1C 6.2 (H) 12/08/2017   HGBA1C 6.4 12/24/2016   Lab Results  Component Value Date  INSULIN 20.3 04/08/2021   INSULIN 22.0 09/27/2018   INSULIN 16.3 03/17/2018   INSULIN 20.9 12/08/2017   Lab Results  Component Value Date   TSH 1.180 04/08/2021   Lab Results  Component Value Date   CHOL 197 04/08/2021   HDL 49 04/08/2021   LDLCALC 133 (H) 04/08/2021   LDLDIRECT 177 (H) 10/28/2010   TRIG 84 04/08/2021   CHOLHDL 7 12/24/2016   Lab Results  Component Value Date   VD25OH 25.2 (L) 04/08/2021   VD25OH 32.4 09/27/2018   VD25OH 29.3 (L) 03/17/2018    Lab Results  Component Value Date   WBC 5.2 04/08/2021   HGB 13.4 04/08/2021   HCT 41.6 04/08/2021   MCV 89 04/08/2021   PLT 336 04/08/2021    Attestation Statements:   Reviewed by clinician on day of visit: allergies, medications, problem list, medical history, surgical history, family history, social history, and previous encounter notes.  Time spent on visit including pre-visit chart review and post-visit care and charting was 25 minutes.   Coral Ceo, CMA, am acting as transcriptionist for Coralie Common, MD.   I have reviewed the above documentation for accuracy and completeness, and I agree with the above. - Coralie Common, MD

## 2021-05-26 ENCOUNTER — Telehealth: Payer: Self-pay | Admitting: Neurology

## 2021-05-26 ENCOUNTER — Ambulatory Visit (HOSPITAL_BASED_OUTPATIENT_CLINIC_OR_DEPARTMENT_OTHER): Payer: 59 | Admitting: Physical Therapy

## 2021-05-26 NOTE — Telephone Encounter (Signed)
LVM/sent mychart message for patient to call back to reschedule. Dr. Brett Fairy off. She can be seen 11/4 or 11/18

## 2021-05-26 NOTE — Therapy (Incomplete)
OUTPATIENT PHYSICAL THERAPY TREATMENT NOTE   Patient Name: Danielle Shaw MRN: [id number] DOB:Mar 29, 1971, 50 y.o., female Today's Date: 05/26/2021  PCP: Isaac Bliss, Rayford Halsted, MD REFERRING PROVIDER: Isaac Bliss, Estel*         Past Medical History:  Diagnosis Date   Arrhythmia    Arthritis    GERD (gastroesophageal reflux disease)    Hyperlipidemia    Hypertension    IUD 2004   IUD 2009   Migraine    Palpitations    Sleep apnea    SOB (shortness of breath)    Past Surgical History:  Procedure Laterality Date   BREAST BIOPSY Right 2011   BREAST SURGERY  2000   reduction   CESAREAN SECTION     COLONOSCOPY WITH PROPOFOL N/A 07/11/2018   Procedure: COLONOSCOPY WITH PROPOFOL;  Surgeon: Thornton Park, MD;  Location: [facility];  Service: Gastroenterology;  Laterality: N/A;   Patient Active Problem List   Diagnosis Date Noted   Neck pain on left side 05/19/2018   Blood in stool 05/19/2018   Circadian rhythm sleep disorder, shift work type 03/30/2018   Obstructive sleep apnea hypopnea, severe 03/30/2018   Carpal tunnel syndrome on both sides 03/30/2018   SI (sacroiliac) joint dysfunction 01/17/2018   Left flank pain 01/07/2018   Sleep paralysis, recurrent isolated 10/20/2017   Complaint related to dreams 10/20/2017   Arthralgia 12/26/2016   Allergic rhinitis 05/03/2015   Right knee pain 04/27/2013   Annual physical exam 07/15/2012   Presence of intrauterine contraceptive device (IUD)    Morbid obesity ([place]) 03/07/2007   Hyperlipidemia 09/23/2006   MIGRAINE, UNSPEC., W/O INTRACTABLE MIGRAINE 09/23/2006   HYPERTENSION, BENIGN SYSTEMIC 09/23/2006    ONSET DATE: May 2022   REFERRING DIAG: M25.561 (ICD-10-CM) - Acute pain of right knee M70.72 (ICD-10-CM) - Ischial bursitis of left side M76.892 (ICD-10-CM) - Hamstring tendinitis of left thigh    THERAPY DIAG:  Pain in joint of right knee   Muscle weakness (generalized)   Difficulty  walking  SUBJECTIVE: Pt states that 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 Getting down/out of the higher forklift made it more painful. She states it happened about Wedneday or Thursday of last week. She feels that the knee was giving out at random times. Pt locates it to the medial joint line. She also describes it as pulling into the R quad. The hip was also aggravated by the climbing up and down the forklift.  PAIN:  PAIN:  Are you having pain? Yes VAS scale: 8/10 R knee, 8/10 hip PAIN TYPE: aching and throbbing Pain description: intermittent  Aggravating factors: WB, standing, stairs  OBJECTIVE:  twitch response elicited in L bicep femoris, and R quad, joint line tenderness of R knee with crepitus  Today's Treatment:    TODAY-   10/25 Manual:  STM L hip rotators/gluteals and L HS, R quad   Exercise:   Supine quad set 5s 10x   Supine half roll, L knee bent, 2x10  Nu step level 1 5 min for knee ROM  Standing gastroc stretch 30s 2x  Seated HS      10/10  Manual:  STM L hip rotators/gluteals and L HS, R quad   Exercise:   Supine half bridge (focus on glute setting) 5s 2x10 Knee extension machine 2x10 20lbs equal WB LTR 3s 10x each Supine hip ABD GTB at knees 5s 15x Seated HS stretch 30s 3x on bilat  09/21 Manual:  STM L hip rotators/gluteals and L HS  Exercise:   Knee extension machine 2x10 20lbs equal WB Supine Bridge -  2x10 2s hold White leg press 2x 10 start from 80 deg knee flexion 50lbs, (2nd set 60/40 bias on R) Supine heel slide with strap 10x 5s Seated HS stretch 30s 3x on bilat STS with 5lb KB thigh height table 2x10      PATIENT EDUCATION:  Education details: anatomy, exercise progression, DOMS expectations, muscle firing, HEP, analgesic effect with exercise Person educated: Patient Education method: Explanation, Demonstration, Tactile cues, Verbal cues, and Handouts Education comprehension: verbalized  understanding, returned demonstration, verbal cues required, and tactile cues required     HOME EXERCISE PROGRAM: Access Code: [email or web address]    ASSESSMENT:   CLINICAL IMPRESSION: Pt presents today with exacerbation of R knee pain and L hip pain following 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 Pt's R knee pain is concerning for exacerbation of internal derangement due to edema, joint line sensitivity, and pain with WB. Pt with unchanged pain at today's session but was able to improve R knee AROM and L LE WB. Pt advised to use thermotherapy and elevation to manage edema as well as consult physician about the use of anti-inflammatories in order to reduce acute inflammatory response. Pt's R ankle also appears to have edema and irritation due to recent impact loading. Plan to improve ROM as able at next session. If no improvement in 1-2 weeks, PT recommends referral back to sports medicine for additional medical management. Pt will benefit from skilled PT to address above impairments and improve overall function.   REHAB POTENTIAL: Good   CLINICAL DECISION MAKING: Stable/uncomplicated   EVALUATION COMPLEXITY: Low     GOALS:     SHORT TERM GOALS:   STG Name Target Date Goal status  1 Pt will become independent with HEP in order to demonstrate synthesis of PT education.   04/21/2021   INITIAL  2 Pt will have an at least 9 pt improvement in LEFS measure in order to demonstrate MCID improvement in daily function. Baseline: 33.8% 04/21/2021  INITIAL  3 Pt will be able to demonstrate ability to STS without UE and pain in order to demonstrate functional improvement in bilat LE function for self-care and house hold duties.   04/21/2021 INITIAL  4 Pt will be able to report a decrease of hip and knee pain by >/= 2/10 at rest in order to demonstrate functional improvement in L hip and R knee pain.   04/21/2021 INITIAL    LONG TERM GOALS:    LTG Name Target  Date Goal status  1 Pt  will become independent with final HEP in order to demonstrate synthesis of PT education.   06/02/2021   INITIAL  2 Pt will be able to perform 5XSTS in under 12s  in order to demonstrate functional improvement above the cut off score for adults.   06/02/2021 INITIAL  3 Pt will be able to demonstrate kneeling to stand, and stand to kneeling transfer with UE support in order to demonstrate functional improvement in LE function for ADL/house hold duties.   06/02/2021 INITIAL  4 Pt will be able to demonstrate/report ability to walk >30 mins without pain in order to demonstrate functional improvement and tolerance to exercise and community mobility.   06/02/2021 INITIAL    PLAN: PT FREQUENCY: 1-2x/week   PT DURATION: 6-8 weeks   PLANNED INTERVENTIONS: Therapeutic exercises, Therapeutic  activity, Neuro Muscular re-education, Balance training, Gait training, Patient/Family education, Joint mobilization, Orthotic/Fit training, Aquatic Therapy, Dry Needling, Electrical stimulation, Cryotherapy, Moist heat, scar mobilization, Taping, Vasopneumatic device, Traction, Ultrasound, Ionotophoresis 4mg /ml Dexamethasone, and Manual therapy   PLAN FOR NEXT SESSION:  lower box squat, increase leg press weight, SLR ABD, standing hip ABD   Daleen Bo PT, DPT 05/26/21 8:51 AM    Patient name: TWILA RAPPA MRN: [id number] DOB: 06-05-1971

## 2021-05-27 ENCOUNTER — Institutional Professional Consult (permissible substitution): Payer: Self-pay | Admitting: Neurology

## 2021-05-29 ENCOUNTER — Encounter: Payer: Self-pay | Admitting: Neurology

## 2021-05-29 ENCOUNTER — Ambulatory Visit: Payer: 59 | Admitting: Neurology

## 2021-05-29 VITALS — BP 145/89 | HR 68 | Ht 65.0 in | Wt 310.5 lb

## 2021-05-29 DIAGNOSIS — G4719 Other hypersomnia: Secondary | ICD-10-CM

## 2021-05-29 DIAGNOSIS — G479 Sleep disorder, unspecified: Secondary | ICD-10-CM

## 2021-05-29 DIAGNOSIS — G4753 Recurrent isolated sleep paralysis: Secondary | ICD-10-CM

## 2021-05-29 DIAGNOSIS — F1190 Opioid use, unspecified, uncomplicated: Secondary | ICD-10-CM | POA: Diagnosis not present

## 2021-05-29 DIAGNOSIS — G4726 Circadian rhythm sleep disorder, shift work type: Secondary | ICD-10-CM | POA: Diagnosis not present

## 2021-05-29 DIAGNOSIS — G4733 Obstructive sleep apnea (adult) (pediatric): Secondary | ICD-10-CM

## 2021-05-29 DIAGNOSIS — G4737 Central sleep apnea in conditions classified elsewhere: Secondary | ICD-10-CM

## 2021-05-29 MED ORDER — MODAFINIL 200 MG PO TABS: 200.0000 mg | ORAL_TABLET | Freq: Every day | ORAL | 5 refills | Status: AC
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
## 2021-05-29 NOTE — Progress Notes (Signed)
CM sent to [facility]  

## 2021-06-02 ENCOUNTER — Encounter (INDEPENDENT_AMBULATORY_CARE_PROVIDER_SITE_OTHER): Payer: Self-pay | Admitting: Family Medicine

## 2021-06-02 ENCOUNTER — Other Ambulatory Visit: Payer: Self-pay

## 2021-06-02 ENCOUNTER — Ambulatory Visit (INDEPENDENT_AMBULATORY_CARE_PROVIDER_SITE_OTHER): Payer: 59 | Admitting: Family Medicine

## 2021-06-02 VITALS — BP 128/83 | HR 79 | Temp 98.2°F | Ht 65.0 in | Wt 300.0 lb

## 2021-06-02 DIAGNOSIS — Z6841 Body Mass Index (BMI) 40.0 and over, adult: Secondary | ICD-10-CM | POA: Diagnosis not present

## 2021-06-02 DIAGNOSIS — R7303 Prediabetes: Secondary | ICD-10-CM | POA: Diagnosis not present

## 2021-06-02 DIAGNOSIS — G4733 Obstructive sleep apnea (adult) (pediatric): Secondary | ICD-10-CM | POA: Diagnosis not present

## 2021-06-02 MED ORDER — TIRZEPATIDE 5 MG/0.5ML ~~LOC~~ SOAJ: 5.0000 mg | SUBCUTANEOUS | 0 refills | Status: DC

## 2021-06-03 NOTE — Progress Notes (Signed)
Chief Complaint:   OBESITY Danielle Shaw is here to discuss her progress with her obesity treatment plan along with follow-up of her obesity related diagnoses. Danielle Shaw is on keeping a food journal and adhering to recommended goals of 1350-1500 calories and 90+ grams protein and states she is following her eating plan approximately 90% of the time. Danielle Shaw states she is doing normal walking.  Today's visit was #: 5 Starting weight: 312 lbs Starting date: 04/08/2021 Today's weight: 300 lbs Today's date: 06/02/2021 Total lbs lost to date: 12 Total lbs lost since last in-office visit: 5  Interim History: Danielle Shaw had a good, restful Halloween. She did not journal on MyFitnessPal but wrote it down instead. She has done much better logging over the last 2 weeks. She is wondering about physical activity. She has an upcoming baby shower and brother's birthday party. Pt is doing well on Mounjaro. She cooks all food for the day and picks at it all day.  Subjective:   1. Pre-diabetes Danielle Shaw is doing well on Mounjaro and denies GI side effects.  2. OSA (obstructive sleep apnea) Danielle Shaw is seeing Dr. Brett Fairy. She is waiting for CPAP.  Assessment/Plan:   1. Pre-diabetes Danielle Shaw will continue to work on weight loss, exercise, and decreasing simple carbohydrates to help decrease the risk of diabetes.   Refill- tirzepatide (MOUNJARO) 5 MG/0.5ML Pen; Inject 5 mg into the skin once a week.  Dispense: 2 mL; Refill: 0  2. OSA (obstructive sleep apnea) Intensive lifestyle modifications are the first line treatment for this issue. We discussed several lifestyle modifications today and she will continue to work on diet, exercise and weight loss efforts. We will continue to monitor. Orders and follow up as documented in patient record. Follow up with Dr. Brett Fairy. Discuss sleep after pt gets CPAP.  3. Obesity with current BMI of 50.0  Danielle Shaw is currently in the action stage of change. As such, her goal is to continue  with weight loss efforts. She has agreed to keeping a food journal and adhering to recommended goals of 1350-1500 calories and 90+ grams protein.   Exercise goals: All adults should avoid inactivity. Some physical activity is better than none, and adults who participate in any amount of physical activity gain some health benefits.  Behavioral modification strategies: increasing lean protein intake, meal planning and cooking strategies, keeping healthy foods in the home, travel eating strategies, holiday eating strategies , and keeping a strict food journal.  Danielle Shaw has agreed to follow-up with our clinic in 3 weeks. She was informed of the importance of frequent follow-up visits to maximize her success with intensive lifestyle modifications for her multiple health conditions.   Objective:   Blood pressure 128/83, pulse 79, temperature 98.2 F (36.8 C), height 5\' 5"  (1.651 m), weight 300 lb (136.1 kg), SpO2 96 %. Body mass index is 49.92 kg/m.  General: Cooperative, alert, well developed, in no acute distress. HEENT: Conjunctivae and lids unremarkable. Cardiovascular: Regular rhythm.  Lungs: Normal work of breathing. Neurologic: No focal deficits.   Lab Results  Component Value Date   CREATININE 0.77 04/08/2021   BUN 15 04/08/2021   NA 139 04/08/2021   K 4.2 04/08/2021   CL 99 04/08/2021   CO2 26 04/08/2021   Lab Results  Component Value Date   ALT 23 04/08/2021   AST 19 04/08/2021   ALKPHOS 133 (H) 04/08/2021   BILITOT 0.5 04/08/2021   Lab Results  Component Value Date   HGBA1C 6.3 (H) 04/08/2021  HGBA1C 6.0 (H) 09/27/2018   HGBA1C 6.0 (H) 03/17/2018   HGBA1C 6.2 (H) 12/08/2017   HGBA1C 6.4 12/24/2016   Lab Results  Component Value Date   INSULIN 20.3 04/08/2021   INSULIN 22.0 09/27/2018   INSULIN 16.3 03/17/2018   INSULIN 20.9 12/08/2017   Lab Results  Component Value Date   TSH 1.180 04/08/2021   Lab Results  Component Value Date   CHOL 197 04/08/2021    HDL 49 04/08/2021   LDLCALC 133 (H) 04/08/2021   LDLDIRECT 177 (H) 10/28/2010   TRIG 84 04/08/2021   CHOLHDL 7 12/24/2016   Lab Results  Component Value Date   VD25OH 25.2 (L) 04/08/2021   VD25OH 32.4 09/27/2018   VD25OH 29.3 (L) 03/17/2018   Lab Results  Component Value Date   WBC 5.2 04/08/2021   HGB 13.4 04/08/2021   HCT 41.6 04/08/2021   MCV 89 04/08/2021   PLT 336 04/08/2021    Attestation Statements:   Reviewed by clinician on day of visit: allergies, medications, problem list, medical history, surgical history, family history, social history, and previous encounter notes.  Coral Ceo, CMA, am acting as transcriptionist for Coralie Common, MD.   I have reviewed the above documentation for accuracy and completeness, and I agree with the above. - Coralie Common, MD

## 2021-06-11 ENCOUNTER — Ambulatory Visit (INDEPENDENT_AMBULATORY_CARE_PROVIDER_SITE_OTHER): Payer: 59 | Admitting: Neurology

## 2021-06-11 DIAGNOSIS — G4733 Obstructive sleep apnea (adult) (pediatric): Secondary | ICD-10-CM | POA: Diagnosis not present

## 2021-06-11 DIAGNOSIS — G4719 Other hypersomnia: Secondary | ICD-10-CM

## 2021-06-11 DIAGNOSIS — G4737 Central sleep apnea in conditions classified elsewhere: Secondary | ICD-10-CM

## 2021-06-11 DIAGNOSIS — G479 Sleep disorder, unspecified: Secondary | ICD-10-CM

## 2021-06-11 DIAGNOSIS — G4726 Circadian rhythm sleep disorder, shift work type: Secondary | ICD-10-CM

## 2021-06-12 NOTE — Progress Notes (Signed)
[facility] TEST REPORT ( by Watch PAT)   STUDY DATE:  06-12-2021 DOB:  Jul 30, 1970    ORDERING CLINICIAN: Larey Seat, MD  REFERRING CLINICIAN:  Isaac Bliss, MD    CLINICAL INFORMATION/HISTORY: The patient had the first sleep study in 5/ 2019 I, a split study diagnosed with a severe OSA of 84/h and she responded to CPAP at 12 cm water pressure.   She used an ResMed AirFit P 10 and medium size during that study.  And we have discussed weight loss plans and sleep hygiene to be implemented but this has been hard for her.  She did have higher REM sleep AHI of 22.9 versus a non-REM sleep AHI of 1.3 once she was on CPAP. She has a histi ory of cardiac palpitations, arrhythmia,  shift work. On modafnil. She had left  her BF and left the CPAP behind, now she is sleepy again.   Epworth sleepiness score: 18/24.   BMI: 51 kg/m   Neck Circumference: 18"   FINDINGS:   Sleep Summary:   Total Recording Time (hours, min):   Mrs. Kavanagh home sleep test amounted to a recording time of 9 hours 59 minutes of which 9 hours and 5 minutes were considered sleep time.  30% of sleep time the REM sleep time.    Percent REM (%): 30%                                       Respiratory Indices:   Calculated pAHI (per hour): The calculated AHI was 45.2/h, during REM sleep at 57.3/h and non-REM sleep 39.9/h.  The positional AHI in supine sleep was 52.6/h and in nonsupine 35.6/h.  The patient had the least apnea when sleeping on her left side with an AHI of 11.8/h.  Snoring was louder than average with a mean volume of 41 dB and snoring accompanied 26.6% of total sleep time.                                          Oxygen Saturation Statistics:   O2 Saturation Range (%):     Oxygen saturation ranged between a nadir of 79% and a maximum of 99% and a mean saturation of 92%.  Time in hypoxia was 14.2 minutes the equivalent of 2.6% of total sleep time.  Pulse Rate  Statistics: Heart rate varied between 50 to 116 bpm with a mean heart rate of 70 bpm.  Please note that a home sleep test cannot indicate cardiac rhythm data.                                            IMPRESSION:  This HST confirms the presence of severe sleep apnea of the obstructive sleep apnea type.  This is not a strictly REM sleep dependent apnea and positional component did not eliminate apnea but reduced apnea in left lateral sleep.   RECOMMENDATION: The patient would need a CPAP again this would be an auto titration device between 6 and 14 cmH2O pressure was 2 cm EPR, a mask or interface of her choice and comfort, and heated  humidification for the machine and tubing.  The patient will be followed by a nurse practitioner in our office 3 to 4 months after set up.  Weight loss would still help to eliminate or reduce apnea.  Also avoiding supine sleep position can assist with reducing her sleep apnea.  I like for the patient to dedicate 4 hours or more each night to sleeping with CPAP to fulfill compliance.    INTERPRETING PHYSICIAN:   Larey Seat, MD   Medical Director of [facility].

## 2021-06-24 ENCOUNTER — Encounter (INDEPENDENT_AMBULATORY_CARE_PROVIDER_SITE_OTHER): Payer: Self-pay | Admitting: Family Medicine

## 2021-06-24 ENCOUNTER — Ambulatory Visit (INDEPENDENT_AMBULATORY_CARE_PROVIDER_SITE_OTHER): Payer: 59 | Admitting: Family Medicine

## 2021-06-24 ENCOUNTER — Other Ambulatory Visit: Payer: Self-pay

## 2021-06-24 VITALS — BP 124/85 | HR 76 | Temp 97.8°F | Ht 65.0 in | Wt 298.0 lb

## 2021-06-24 DIAGNOSIS — R7303 Prediabetes: Secondary | ICD-10-CM | POA: Diagnosis not present

## 2021-06-24 DIAGNOSIS — E559 Vitamin D deficiency, unspecified: Secondary | ICD-10-CM | POA: Diagnosis not present

## 2021-06-24 DIAGNOSIS — Z6841 Body Mass Index (BMI) 40.0 and over, adult: Secondary | ICD-10-CM

## 2021-06-24 DIAGNOSIS — I10 Essential (primary) hypertension: Secondary | ICD-10-CM | POA: Diagnosis not present

## 2021-06-24 MED ORDER — VITAMIN D (ERGOCALCIFEROL) 1.25 MG (50000 UNIT) PO CAPS: 50000.0000 [IU] | ORAL_CAPSULE | ORAL | 0 refills | Status: DC

## 2021-06-24 MED ORDER — TIRZEPATIDE 7.5 MG/0.5ML ~~LOC~~ SOAJ
7.5000 mg | SUBCUTANEOUS | 0 refills | Status: DC
Start: 2021-06-24 — End: 2021-07-15

## 2021-06-24 NOTE — Progress Notes (Signed)
Chief Complaint:   OBESITY Danielle Shaw is here to discuss her progress with her obesity treatment plan along with follow-up of her obesity related diagnoses. Danielle Shaw is on keeping a food journal and adhering to recommended goals of 1350-1500 calories and 90+ grams protein and states she is following her eating plan approximately 95% of the time. Danielle Shaw states she is not currently exercising.  Today's visit was #: 6 Starting weight: 312 lbs Starting date: 04/08/2021 Today's weight: 298 lbs Today's date: 06/24/2021 Total lbs lost to date: 14 Total lbs lost since last in-office visit: 2  Interim History: Danielle Shaw had a family filled Thanksgiving. She ate some higher carb foods but did not overindulge. She is doing well with journaling. Last week, as a whole, pt struggled to stay on plan. She is ending at 1500 calories (eating all calories even if not hungry). She is doing cucumbers, fruit, or yogurt for snack.  Subjective:   1. Vitamin D deficiency Pt denies nausea, vomiting, and muscle weakness but notes fatigue. She is on prescription Vit D. Her last Vit D level was 25.2.  2. Pre-diabetes Danielle Shaw is on Mounjaro 5 mg weekly. She is able to get in almost all calories daily. Her last A1c was 6.3.  3. Essential hypertension BP well controlled today. Pt denies chest pain/chest pressure/headache. She is on Cozaar and aldactone.  Assessment/Plan:   1. Vitamin D deficiency Low Vitamin D level contributes to fatigue and are associated with obesity, breast, and colon cancer. She agrees to continue to take prescription Vitamin D 50,000 IU every week and will follow-up for routine testing of Vitamin D, at least 2-3 times per year to avoid over-replacement.  Refill- Vitamin D, Ergocalciferol, (DRISDOL) 1.25 MG (50000 UNIT) CAPS capsule; Take 1 capsule (50,000 Units total) by mouth every 7 (seven) days.  Dispense: 4 capsule; Refill: 0  2. Pre-diabetes Danielle Shaw will increase Mounjaro to 7.5 mg and continue  to work on weight loss, exercise, and decreasing simple carbohydrates to help decrease the risk of diabetes. Sending Rx in now so pt will not run out between appt's, given holiday.  Increase & Refill- tirzepatide (MOUNJARO) 7.5 MG/0.5ML Pen; Inject 7.5 mg into the skin once a week.  Dispense: 6 mL; Refill: 0  3. Essential hypertension Danielle Shaw is working on healthy weight loss and exercise to improve blood pressure control. We will watch for signs of hypotension as she continues her lifestyle modifications. Continue current treatment plan. Follow up with PCP.  4. Obesity with current BMI of 49.6  Danielle Shaw is currently in the action stage of change. As such, her goal is to continue with weight loss efforts. She has agreed to keeping a food journal and adhering to recommended goals of 1350-1500 calories and 90+ grams protein.   Exercise goals: All adults should avoid inactivity. Some physical activity is better than none, and adults who participate in any amount of physical activity gain some health benefits.  Behavioral modification strategies: increasing lean protein intake, meal planning and cooking strategies, keeping healthy foods in the home, planning for success, and keeping a strict food journal.  Danielle Shaw has agreed to follow-up with our clinic in 2-3 weeks. She was informed of the importance of frequent follow-up visits to maximize her success with intensive lifestyle modifications for her multiple health conditions.   Objective:   Blood pressure 124/85, pulse 76, temperature 97.8 F (36.6 C), height 5\' 5"  (1.651 m), weight 298 lb (135.2 kg), SpO2 99 %. Body mass index is  49.59 kg/m.  General: Cooperative, alert, well developed, in no acute distress. HEENT: Conjunctivae and lids unremarkable. Cardiovascular: Regular rhythm.  Lungs: Normal work of breathing. Neurologic: No focal deficits.   Lab Results  Component Value Date   CREATININE 0.77 04/08/2021   BUN 15 04/08/2021   NA 139  04/08/2021   K 4.2 04/08/2021   CL 99 04/08/2021   CO2 26 04/08/2021   Lab Results  Component Value Date   ALT 23 04/08/2021   AST 19 04/08/2021   ALKPHOS 133 (H) 04/08/2021   BILITOT 0.5 04/08/2021   Lab Results  Component Value Date   HGBA1C 6.3 (H) 04/08/2021   HGBA1C 6.0 (H) 09/27/2018   HGBA1C 6.0 (H) 03/17/2018   HGBA1C 6.2 (H) 12/08/2017   HGBA1C 6.4 12/24/2016   Lab Results  Component Value Date   INSULIN 20.3 04/08/2021   INSULIN 22.0 09/27/2018   INSULIN 16.3 03/17/2018   INSULIN 20.9 12/08/2017   Lab Results  Component Value Date   TSH 1.180 04/08/2021   Lab Results  Component Value Date   CHOL 197 04/08/2021   HDL 49 04/08/2021   LDLCALC 133 (H) 04/08/2021   LDLDIRECT 177 (H) 10/28/2010   TRIG 84 04/08/2021   CHOLHDL 7 12/24/2016   Lab Results  Component Value Date   VD25OH 25.2 (L) 04/08/2021   VD25OH 32.4 09/27/2018   VD25OH 29.3 (L) 03/17/2018   Lab Results  Component Value Date   WBC 5.2 04/08/2021   HGB 13.4 04/08/2021   HCT 41.6 04/08/2021   MCV 89 04/08/2021   PLT 336 04/08/2021    Attestation Statements:   Reviewed by clinician on day of visit: allergies, medications, problem list, medical history, surgical history, family history, social history, and previous encounter notes.  Coral Ceo, CMA, am acting as transcriptionist for Coralie Common, MD.  I have reviewed the above documentation for accuracy and completeness, and I agree with the above. - Coralie Common, MD

## 2021-06-25 ENCOUNTER — Ambulatory Visit (INDEPENDENT_AMBULATORY_CARE_PROVIDER_SITE_OTHER): Payer: 59 | Admitting: Internal Medicine

## 2021-06-25 ENCOUNTER — Encounter: Payer: Self-pay | Admitting: Internal Medicine

## 2021-06-25 ENCOUNTER — Telehealth (INDEPENDENT_AMBULATORY_CARE_PROVIDER_SITE_OTHER): Payer: Self-pay | Admitting: Family Medicine

## 2021-06-25 ENCOUNTER — Encounter (INDEPENDENT_AMBULATORY_CARE_PROVIDER_SITE_OTHER): Payer: Self-pay

## 2021-06-25 DIAGNOSIS — Z1231 Encounter for screening mammogram for malignant neoplasm of breast: Secondary | ICD-10-CM

## 2021-06-25 DIAGNOSIS — I10 Essential (primary) hypertension: Secondary | ICD-10-CM

## 2021-06-25 DIAGNOSIS — G4733 Obstructive sleep apnea (adult) (pediatric): Secondary | ICD-10-CM

## 2021-06-25 DIAGNOSIS — E785 Hyperlipidemia, unspecified: Secondary | ICD-10-CM

## 2021-06-25 DIAGNOSIS — Z23 Encounter for immunization: Secondary | ICD-10-CM | POA: Diagnosis not present

## 2021-06-25 DIAGNOSIS — R7302 Impaired glucose tolerance (oral): Secondary | ICD-10-CM

## 2021-06-25 NOTE — Addendum Note (Signed)
Addended by: Westley Hummer B on: 06/25/2021 08:17 AM   Modules accepted: Orders

## 2021-06-25 NOTE — Telephone Encounter (Signed)
Prior authorization denied for Mounjaro. Patient sent mychart message.  

## 2021-06-25 NOTE — Patient Instructions (Signed)
-  Nice seeing you today!!  -Flu vaccine today.  -Mammogram requested.  -Make sure to follow up with GYN.  -Schedule follow up in 6 months for your physical.

## 2021-06-25 NOTE — Progress Notes (Signed)
Established Patient Office Visit     This visit occurred during the SARS-CoV-2 public health emergency.  Safety protocols were in place, including screening questions prior to the visit, additional usage of staff PPE, and extensive cleaning of exam room while observing appropriate contact time as indicated for disinfecting solutions.    CC/Reason for Visit: 55-month follow-up chronic medical conditions  HPI: Danielle Shaw is a 50 y.o. female who is coming in today for the above mentioned reasons. Past Medical History is significant for: Morbid obesity, obstructive sleep apnea, hypertension, hyperlipidemia, impaired glucose tolerance.  She is now back to wearing her CPAP.  She is going to the weight and wellness clinic and has lost approximately 13 pounds, was started on Mounjaro.  She is due for a flu vaccine, Pap smear and mammogram.  She is overdue for CPE.   Past Medical/Surgical History: Past Medical History:  Diagnosis Date   Arrhythmia    Arthritis    GERD (gastroesophageal reflux disease)    Hyperlipidemia    Hypertension    IUD 2004   IUD 2009   Migraine    Palpitations    Sleep apnea    SOB (shortness of breath)     Past Surgical History:  Procedure Laterality Date   BREAST BIOPSY Right 2011   BREAST SURGERY  2000   reduction   CESAREAN SECTION     COLONOSCOPY WITH PROPOFOL N/A 07/11/2018   Procedure: COLONOSCOPY WITH PROPOFOL;  Surgeon: Thornton Park, MD;  Location: [facility];  Service: Gastroenterology;  Laterality: N/A;    Social History:  reports that she has never smoked. She has never used smokeless tobacco. She reports current alcohol use. She reports that she does not use drugs.  Allergies: Allergies  Allergen Reactions   Latex    Lisinopril Cough   Metoprolol Other (See Comments)    Funny feeling    Family History:  Family History  Problem Relation Age of Onset   Hyperlipidemia Mother    Hypertension Mother    Heart disease  Mother 10   High Cholesterol Mother    High blood pressure Mother    Hyperlipidemia Father    Hypertension Father    Diabetes Father    Heart disease Father    High blood pressure Father    High Cholesterol Father    Heart disease Maternal Grandmother    Hodgkin's lymphoma Daughter      Current Outpatient Medications:    albuterol (PROVENTIL HFA;VENTOLIN HFA) 108 (90 Base) MCG/ACT inhaler, INHALE TWO PUFFS BY MOUTH EVERY 6 HOURS AS NEEDED FOR WHEEZING OR SHORTNESS OF BREATH (Patient taking differently: Inhale 2 puffs into the lungs every 6 (six) hours as needed for wheezing or shortness of breath. INHALE TWO PUFFS BY MOUTH EVERY 6 HOURS AS NEEDED FOR WHEEZING OR SHORTNESS OF BREATH), Disp: 18 each, Rfl: 1   cyanocobalamin (,VITAMIN B-12,) 1000 MCG/ML injection, Inject 1,000 mcg into the muscle once., Disp: , Rfl:    cyclobenzaprine (FLEXERIL) 5 MG tablet, Take 1 tablet (5 mg total) by mouth 3 (three) times daily as needed for muscle spasms., Disp: 30 tablet, Rfl: 1   gabapentin (NEURONTIN) 100 MG capsule, Take 100 mg by mouth 3 (three) times daily., Disp: , Rfl:    HYDROcodone-acetaminophen (NORCO/VICODIN) 5-325 MG tablet, Take 1 tablet by mouth 2 (two) times daily as needed., Disp: , Rfl:    levocetirizine (XYZAL) 5 MG tablet, Take 5 mg by mouth every evening., Disp: , Rfl:  losartan (COZAAR) 100 MG tablet, Take 1 tablet (100 mg total) by mouth daily., Disp: 90 tablet, Rfl: 1   modafinil (PROVIGIL) 200 MG tablet, Take 1 tablet (200 mg total) by mouth daily., Disp: 30 tablet, Rfl: 5   omeprazole (PRILOSEC) 20 MG capsule, Take 20 mg by mouth daily., Disp: , Rfl:    ondansetron (ZOFRAN) 4 MG tablet, Take 1 tablet (4 mg total) by mouth every 8 (eight) hours as needed for nausea or vomiting., Disp: 50 tablet, Rfl: 0   pantoprazole (PROTONIX) 40 MG tablet, Take 1 tablet (40 mg total) by mouth daily., Disp: 90 tablet, Rfl: 3   rosuvastatin (CRESTOR) 20 MG tablet, Take 1 tablet (20 mg total) by  mouth daily., Disp: 90 tablet, Rfl: 1   spironolactone (ALDACTONE) 25 MG tablet, Take 1 tablet (25 mg total) by mouth daily. Take 25 mg by mouth daily., Disp: 90 tablet, Rfl: 1   tirzepatide (MOUNJARO) 7.5 MG/0.5ML Pen, Inject 7.5 mg into the skin once a week., Disp: 6 mL, Rfl: 0   Vitamin D, Ergocalciferol, (DRISDOL) 1.25 MG (50000 UNIT) CAPS capsule, Take 1 capsule (50,000 Units total) by mouth every 7 (seven) days., Disp: 4 capsule, Rfl: 0  Review of Systems:  Constitutional: Denies fever, chills, diaphoresis, appetite change and fatigue.  HEENT: Denies photophobia, eye pain, redness, hearing loss, ear pain, congestion, sore throat, rhinorrhea, sneezing, mouth sores, trouble swallowing, neck pain, neck stiffness and tinnitus.   Respiratory: Denies SOB, DOE, cough, chest tightness,  and wheezing.   Cardiovascular: Denies chest pain, palpitations and leg swelling.  Gastrointestinal: Denies nausea, vomiting, abdominal pain, diarrhea, constipation, blood in stool and abdominal distention.  Genitourinary: Denies dysuria, urgency, frequency, hematuria, flank pain and difficulty urinating.  Endocrine: Denies: hot or cold intolerance, sweats, changes in hair or nails, polyuria, polydipsia. Musculoskeletal: Denies myalgias, back pain, joint swelling, arthralgias and gait problem.  Skin: Denies pallor, rash and wound.  Neurological: Denies dizziness, seizures, syncope, weakness, light-headedness, numbness and headaches.  Hematological: Denies adenopathy. Easy bruising, personal or family bleeding history  Psychiatric/Behavioral: Denies suicidal ideation, mood changes, confusion, nervousness, sleep disturbance and agitation    Physical Exam: Vitals:   06/25/21 0738  BP: 120/84  Pulse: 68  Temp: 98 F (36.7 C)  TempSrc: Oral  SpO2: 99%  Weight: (!) 303 lb 8 oz (137.7 kg)    Body mass index is 50.51 kg/m.   Constitutional: NAD, calm, comfortable, obese Eyes: PERRL, lids and conjunctivae  normal ENMT: Mucous membranes are moist.  Respiratory: clear to auscultation bilaterally, no wheezing, no crackles. Normal respiratory effort. No accessory muscle use.  Cardiovascular: Regular rate and rhythm, no murmurs / rubs / gallops. No extremity edema.  Neurologic: Grossly intact and nonfocal Psychiatric: Normal judgment and insight. Alert and oriented x 3. Normal mood.    Impression and Plan:  Morbid obesity ([place]) -Discussed healthy lifestyle, including increased physical activity and better food choices to promote weight loss. -Congratulated on weight loss success thus far.  HYPERTENSION, BENIGN SYSTEMIC -Well-controlled.  Hyperlipidemia, unspecified hyperlipidemia type -Last lipid panel in September with a total cholesterol of 197, triglycerides 84 and LDL 133.  She is not on a statin but is aggressively working on lifestyle changes.  Obstructive sleep apnea hypopnea, severe -Followed by sleep medicine, on nightly CPAP.  IGT (impaired glucose tolerance) -A1c was 6.3 in September.  Need for influenza vaccination -Flu vaccine administered today.  Time spent: 33 minutes reviewing chart, interviewing and examining patient and formulating plan of care.  Patient Instructions  -Nice seeing you today!!  -Flu vaccine today.  -Mammogram requested.  -Make sure to follow up with GYN.  -Schedule follow up in 6 months for your physical.    Lelon Frohlich, MD [facility]

## 2021-07-07 ENCOUNTER — Encounter: Payer: Self-pay | Admitting: *Deleted

## 2021-07-07 ENCOUNTER — Telehealth: Payer: Self-pay | Admitting: *Deleted

## 2021-07-07 DIAGNOSIS — G4737 Central sleep apnea in conditions classified elsewhere: Secondary | ICD-10-CM | POA: Insufficient documentation

## 2021-07-07 DIAGNOSIS — F1190 Opioid use, unspecified, uncomplicated: Secondary | ICD-10-CM | POA: Insufficient documentation

## 2021-07-07 DIAGNOSIS — G4733 Obstructive sleep apnea (adult) (pediatric): Secondary | ICD-10-CM | POA: Insufficient documentation

## 2021-07-07 DIAGNOSIS — G4726 Circadian rhythm sleep disorder, shift work type: Secondary | ICD-10-CM | POA: Insufficient documentation

## 2021-07-07 DIAGNOSIS — G479 Sleep disorder, unspecified: Secondary | ICD-10-CM | POA: Insufficient documentation

## 2021-07-07 DIAGNOSIS — G4719 Other hypersomnia: Secondary | ICD-10-CM | POA: Insufficient documentation

## 2021-07-07 NOTE — Telephone Encounter (Signed)
I called pt. I advised pt that Dr. Brett Fairy reviewed their sleep study results and found that pt does have severe sleep apnea. Dr. Brett Fairy recommends that pt continue on CPAP therapy. I reviewed PAP compliance expectations with the pt. Pt is agreeable to re-starting a CPAP. I advised pt that an order will be sent to a DME, [facility], and [facility] will call the pt within about one week. Confirmed she still has Resmed 10 that she was set up with 01/03/2018. Aware she will continue to use this machine (only qualifies for new one if 5 years or older).   [facility] will show the pt how to use the machine, fit for masks, and troubleshoot the CPAP if needed. A follow up appt was made for insurance purposes with Dr. Brett Fairy 10/08/21 at 1:30p. Pt verbalized understanding to arrive 15 minutes early and bring their CPAP. A letter with all of this information in it will be mailed to the pt as a reminder. I verified with the pt that the address we have on file is correct. Pt verbalized understanding of results. Pt had no questions at this time but was encouraged to call back if questions arise. I have sent the order to [facility] and have received confirmation that they have received the order.

## 2021-07-07 NOTE — Telephone Encounter (Signed)
-----   Message from Larey Seat, MD sent at 07/07/2021 10:09 AM EST ----- The patient had the first sleep study in 5/ 2019 I, a split study diagnosed with a severe OSA of 84/h and she responded to CPAP at 12 cm water pressure.  She used an ResMed AirFit P 10 medium size during that study.  IMPRESSION:  This HST confirms the presence of severe sleep apnea of the obstructive sleep apnea type.  This is not a strictly REM sleep dependent apnea and positional component did not eliminate apnea but reduced apnea in left lateral sleep.  RECOMMENDATION: The patient would need a CPAP again this would be an auto titration device between 6 and 14 cmH2O pressure was 2 cm EPR, a mask or interface of her choice and comfort, and heated humidification for the machine and tubing.  The patient will be followed by a nurse practitioner in our office 3 to 4 months after set up.  Weight loss would still help to eliminate or reduce apnea.  Also avoiding supine sleep position can assist with reducing her sleep apnea.  I like for the patient to dedicate 4 hours or more each night to sleeping with CPAP to fulfill compliance.

## 2021-07-07 NOTE — Progress Notes (Signed)
The patient had the first sleep study in 5/ 2019 I, a split study diagnosed with a severe OSA of 84/h and she responded to CPAP at 12 cm water pressure.  She used an ResMed AirFit P 10 medium size during that study.  IMPRESSION:  This HST confirms the presence of severe sleep apnea of the obstructive sleep apnea type.  This is not a strictly REM sleep dependent apnea and positional component did not eliminate apnea but reduced apnea in left lateral sleep.  RECOMMENDATION: The patient would need a CPAP again this would be an auto titration device between 6 and 14 cmH2O pressure was 2 cm EPR, a mask or interface of her choice and comfort, and heated humidification for the machine and tubing.  The patient will be followed by a nurse practitioner in our office 3 to 4 months after set up.  Weight loss would still help to eliminate or reduce apnea.  Also avoiding supine sleep position can assist with reducing her sleep apnea.  I like for the patient to dedicate 4 hours or more each night to sleeping with CPAP to fulfill compliance.

## 2021-07-07 NOTE — Procedures (Signed)
[facility] [place] TEST REPORT ( by Watch PAT)   STUDY DATE:  06-12-2021 DOB:  05/09/1971    ORDERING CLINICIAN: Larey Seat, MD  REFERRING CLINICIAN:  Isaac Bliss, MD    CLINICAL INFORMATION/HISTORY: The patient had the first sleep study in 5/ 2019 I, a split study diagnosed with a severe OSA of 84/h and she responded to CPAP at 12 cm water pressure.   She used an ResMed AirFit P 10 and medium size during that study.  And we have discussed weight loss plans and sleep hygiene to be implemented but this has been hard for her.  She did have higher REM sleep AHI of 22.9 versus a non-REM sleep AHI of 1.3 once she was on CPAP. She has a histi ory of cardiac palpitations, arrhythmia,  shift work. On modafnil. She had left  her BF and left the CPAP behind, now she is sleepy again.   Epworth sleepiness score: 18/24.   BMI: 51 kg/m   Neck Circumference: 18"   FINDINGS:   Sleep Summary:   Total Recording Time (hours, min):   Danielle Shaw home sleep test amounted to a recording time of 9 hours 59 minutes of which 9 hours and 5 minutes were considered sleep time.  30% of sleep time the REM sleep time.    Percent REM (%): 30%                                       Respiratory Indices:   Calculated pAHI (per hour): The calculated AHI was 45.2/h, during REM sleep at 57.3/h and non-REM sleep 39.9/h.  The positional AHI in supine sleep was 52.6/h and in nonsupine 35.6/h.  The patient had the least apnea when sleeping on her left side with an AHI of 11.8/h.  Snoring was louder than average with a mean volume of 41 dB and snoring accompanied 26.6% of total sleep time.                                          Oxygen Saturation Statistics:   O2 Saturation Range (%):     Oxygen saturation ranged between a nadir of 79% and a maximum of 99% and a mean saturation of 92%.  Time in hypoxia was 14.2 minutes the equivalent of 2.6% of total sleep time.  Pulse Rate Statistics:  Heart rate varied between 50 to 116 bpm with a mean heart rate of 70 bpm.  Please note that a home sleep test cannot indicate cardiac rhythm data.                                            IMPRESSION:  This HST confirms the presence of severe sleep apnea of the obstructive sleep apnea type.  This is not a strictly REM sleep dependent apnea and positional component did not eliminate apnea but reduced apnea in left lateral sleep.   RECOMMENDATION: The patient would need a CPAP again this would be an auto titration device between 6 and 14 cmH2O pressure was 2 cm EPR, a mask or interface of her choice and comfort, and heated humidification for the machine  and tubing.  The patient will be followed by a nurse practitioner in our office 3 to 4 months after set up.  Weight loss would still help to eliminate or reduce apnea.  Also avoiding supine sleep position can assist with reducing her sleep apnea.  I like for the patient to dedicate 4 hours or more each night to sleeping with CPAP to fulfill compliance.    INTERPRETING PHYSICIAN:   Larey Seat, MD   Medical Director of [facility] at [facility].

## 2021-07-07 NOTE — Addendum Note (Signed)
Addended by: Larey Seat on: 07/07/2021 10:09 AM   Modules accepted: Orders

## 2021-07-14 ENCOUNTER — Encounter (INDEPENDENT_AMBULATORY_CARE_PROVIDER_SITE_OTHER): Payer: Self-pay

## 2021-07-15 ENCOUNTER — Ambulatory Visit (INDEPENDENT_AMBULATORY_CARE_PROVIDER_SITE_OTHER): Payer: 59 | Admitting: Physician Assistant

## 2021-07-15 ENCOUNTER — Encounter (INDEPENDENT_AMBULATORY_CARE_PROVIDER_SITE_OTHER): Payer: Self-pay | Admitting: Physician Assistant

## 2021-07-15 ENCOUNTER — Other Ambulatory Visit: Payer: Self-pay

## 2021-07-15 VITALS — BP 118/77 | HR 87 | Temp 98.2°F | Ht 65.0 in | Wt 298.0 lb

## 2021-07-15 DIAGNOSIS — Z9189 Other specified personal risk factors, not elsewhere classified: Secondary | ICD-10-CM

## 2021-07-15 DIAGNOSIS — E559 Vitamin D deficiency, unspecified: Secondary | ICD-10-CM

## 2021-07-15 DIAGNOSIS — Z6841 Body Mass Index (BMI) 40.0 and over, adult: Secondary | ICD-10-CM

## 2021-07-15 DIAGNOSIS — R7303 Prediabetes: Secondary | ICD-10-CM | POA: Diagnosis not present

## 2021-07-15 MED ORDER — VITAMIN D (ERGOCALCIFEROL) 1.25 MG (50000 UNIT) PO CAPS: 50000.0000 [IU] | ORAL_CAPSULE | ORAL | 0 refills | Status: DC

## 2021-07-15 MED ORDER — TIRZEPATIDE 5 MG/0.5ML ~~LOC~~ SOAJ: 5.0000 mg | SUBCUTANEOUS | 0 refills | Status: DC

## 2021-07-15 NOTE — Progress Notes (Signed)
Chief Complaint:   OBESITY Danielle Shaw is here to discuss her progress with her obesity treatment plan along with follow-up of her obesity related diagnoses. Danielle Shaw is on keeping a food journal and adhering to recommended goals of 1350-1500 calories and 90 grams protein and states she is following her eating plan approximately 75% of the time. Danielle Shaw states she is walking 20-30 minutes 3 times per week.  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 She often skips dinner, as she is tired when she gets home. She also notes lack of hunger on Mounjaro.  Subjective:   1. Pre-diabetes Danielle Shaw is on Mounjaro 7.5 mg. Her last A1c was 6.3. She reports she is not hungry most days.  2. Vitamin D deficiency Pt denies nausea, vomiting, and muscle weakness and is on prescription Vit D.  3. At risk for diabetes mellitus Danielle Shaw is at higher than average risk for developing diabetes due to obesity.   Assessment/Plan:   1. Pre-diabetes Danielle Shaw will continue to work on weight loss, exercise, and decreasing simple carbohydrates to help decrease the risk of diabetes.   Refill- tirzepatide (MOUNJARO) 5 MG/0.5ML Pen; Inject 5 mg into the skin once a week.  Dispense: 2 mL; Refill: 0  2. Vitamin D deficiency Low Vitamin D level contributes to fatigue and are associated with obesity, breast, and colon cancer. She agrees to continue to take prescription Vitamin D 50,000 IU every week and will follow-up for routine testing of Vitamin D, at least 2-3 times per year to avoid over-replacement.  Refill- Vitamin D, Ergocalciferol, (DRISDOL) 1.25 MG (50000 UNIT) CAPS capsule; Take 1 capsule (50,000 Units total) by mouth every 7 (seven) days.  Dispense: 4 capsule; Refill:  0  3. At risk for diabetes mellitus Danielle Shaw was given approximately 15 minutes of diabetes education and counseling today. We discussed intensive lifestyle modifications today with an emphasis on weight loss as well as increasing exercise and decreasing simple carbohydrates in her diet. We also reviewed medication options with an emphasis on risk versus benefit of those discussed.   Repetitive spaced learning was employed today to elicit superior memory formation and behavioral change.  4. Obesity with current BMI of 49.59  Danielle Shaw is currently in the action stage of change. As such, her goal is to continue with weight loss efforts. She has agreed to keeping a food journal and adhering to recommended goals of 1350-1500 calories and 90 grams protein.   Exercise goals:  As is  Behavioral modification strategies: increasing lean protein intake and no skipping meals.  Danielle Shaw has agreed to follow-up with our clinic in 2-3 weeks. She was informed of the importance of frequent follow-up visits to maximize her success with intensive lifestyle modifications for her multiple health conditions.   Objective:   Blood pressure 118/77, pulse 87, temperature 98.2 F (36.8 C), height 5\' 5"  (1.651 m), weight 298 lb (135.2 kg), SpO2 98 %. Body mass index is 49.59 kg/m.  General: Cooperative, alert, well developed, in no acute distress. HEENT: Conjunctivae and lids unremarkable. Cardiovascular: Regular rhythm.  Lungs: Normal work of breathing. Neurologic: No focal deficits.   Lab Results  Component Value Date   CREATININE 0.77 04/08/2021   BUN 15 04/08/2021  NA 139 04/08/2021   K 4.2 04/08/2021   CL 99 04/08/2021   CO2 26 04/08/2021   Lab Results  Component Value Date   ALT 23 04/08/2021   AST 19 04/08/2021   ALKPHOS 133 (H) 04/08/2021   BILITOT 0.5 04/08/2021   Lab Results  Component Value Date   HGBA1C 6.3 (H) 04/08/2021   HGBA1C 6.0 (H) 09/27/2018   HGBA1C 6.0 (H) 03/17/2018   HGBA1C  6.2 (H) 12/08/2017   HGBA1C 6.4 12/24/2016   Lab Results  Component Value Date   INSULIN 20.3 04/08/2021   INSULIN 22.0 09/27/2018   INSULIN 16.3 03/17/2018   INSULIN 20.9 12/08/2017   Lab Results  Component Value Date   TSH 1.180 04/08/2021   Lab Results  Component Value Date   CHOL 197 04/08/2021   HDL 49 04/08/2021   LDLCALC 133 (H) 04/08/2021   LDLDIRECT 177 (H) 10/28/2010   TRIG 84 04/08/2021   CHOLHDL 7 12/24/2016   Lab Results  Component Value Date   VD25OH 25.2 (L) 04/08/2021   VD25OH 32.4 09/27/2018   VD25OH 29.3 (L) 03/17/2018   Lab Results  Component Value Date   WBC 5.2 04/08/2021   HGB 13.4 04/08/2021   HCT 41.6 04/08/2021   MCV 89 04/08/2021   PLT 336 04/08/2021    Attestation Statements:   Reviewed by clinician on day of visit: allergies, medications, problem list, medical history, surgical history, family history, social history, and previous encounter notes.  Coral Ceo, CMA, am acting as transcriptionist for [employer], PA-C.  I have reviewed the above documentation for accuracy and completeness, and I agree with the above. Abby Potash, PA-C

## 2021-07-16 ENCOUNTER — Other Ambulatory Visit (INDEPENDENT_AMBULATORY_CARE_PROVIDER_SITE_OTHER): Payer: Self-pay | Admitting: Physician Assistant

## 2021-07-16 DIAGNOSIS — R7303 Prediabetes: Secondary | ICD-10-CM

## 2021-07-17 ENCOUNTER — Telehealth (INDEPENDENT_AMBULATORY_CARE_PROVIDER_SITE_OTHER): Payer: Self-pay | Admitting: Physician Assistant

## 2021-07-17 ENCOUNTER — Telehealth (INDEPENDENT_AMBULATORY_CARE_PROVIDER_SITE_OTHER): Payer: Self-pay

## 2021-07-17 ENCOUNTER — Other Ambulatory Visit (INDEPENDENT_AMBULATORY_CARE_PROVIDER_SITE_OTHER): Payer: Self-pay | Admitting: Bariatrics

## 2021-07-17 MED ORDER — TIRZEPATIDE 10 MG/0.5ML ~~LOC~~ SOAJ: 10.0000 mg | SUBCUTANEOUS | 0 refills | Status: DC

## 2021-07-17 NOTE — Telephone Encounter (Signed)
[facility] called needing prior authorization for [facility].

## 2021-07-17 NOTE — Telephone Encounter (Signed)
Prior authorization was started for [facility] 07/16/2021. Waiting on response from insurance.

## 2021-07-17 NOTE — Telephone Encounter (Signed)
Pt was in the clinic on 07/15/21 to see Abby Potash.   Pt calling into clinic.  Mounjaro 5 mg and 7.5 mg are on backorder.  All the pharmacy has in stock is the 10 mg dose.  Pt is okay in going up on the dose, if that is what you choose to do for her.

## 2021-07-31 NOTE — Telephone Encounter (Signed)
N/Aon July 17, 2021 We received your request for prior authorization for mounjaro, for the above member; however, [employer] has a denied request on file for mounjaro for this member. Please follow the appeals process outlined in the original denial or contact Prior Authorization Department at [phone] for further questions.

## 2021-08-04 ENCOUNTER — Ambulatory Visit
Admission: RE | Admit: 2021-08-04 | Discharge: 2021-08-04 | Disposition: A | Discharge status: Home / Self Care | Payer: 59 | Source: Ambulatory Visit | Attending: Internal Medicine | Admitting: Internal Medicine

## 2021-08-04 DIAGNOSIS — Z1231 Encounter for screening mammogram for malignant neoplasm of breast: Secondary | ICD-10-CM

## 2021-08-07 ENCOUNTER — Other Ambulatory Visit: Payer: Self-pay

## 2021-08-07 ENCOUNTER — Ambulatory Visit (INDEPENDENT_AMBULATORY_CARE_PROVIDER_SITE_OTHER): Payer: 59 | Admitting: Family Medicine

## 2021-08-07 ENCOUNTER — Encounter (INDEPENDENT_AMBULATORY_CARE_PROVIDER_SITE_OTHER): Payer: Self-pay | Admitting: Family Medicine

## 2021-08-07 VITALS — BP 126/85 | HR 86 | Temp 98.3°F | Ht 65.0 in | Wt 289.0 lb

## 2021-08-07 DIAGNOSIS — Z6841 Body Mass Index (BMI) 40.0 and over, adult: Secondary | ICD-10-CM

## 2021-08-07 DIAGNOSIS — E669 Obesity, unspecified: Secondary | ICD-10-CM

## 2021-08-07 DIAGNOSIS — E559 Vitamin D deficiency, unspecified: Secondary | ICD-10-CM | POA: Diagnosis not present

## 2021-08-07 DIAGNOSIS — I10 Essential (primary) hypertension: Secondary | ICD-10-CM | POA: Diagnosis not present

## 2021-08-07 DIAGNOSIS — R7303 Prediabetes: Secondary | ICD-10-CM

## 2021-08-07 MED ORDER — TIRZEPATIDE 10 MG/0.5ML ~~LOC~~ SOAJ: 10.0000 mg | SUBCUTANEOUS | 0 refills | Status: DC

## 2021-08-07 MED ORDER — VITAMIN D (ERGOCALCIFEROL) 1.25 MG (50000 UNIT) PO CAPS: 50000.0000 [IU] | ORAL_CAPSULE | ORAL | 0 refills | Status: DC

## 2021-08-11 NOTE — Progress Notes (Signed)
Chief Complaint:   OBESITY Danielle Shaw is here to discuss her progress with her obesity treatment plan along with follow-up of her obesity related diagnoses. Danielle Shaw is on the Category 3 Plan and states she is following her eating plan approximately 100% of the time. Danielle Shaw states she is walking for 30 minutes 2-3 times per week.  Today's visit was #: 8 Starting weight: 312 lbs Starting date: 04/08/2021 Today's weight: 289 lbs Today's date: 08/07/2021 Total lbs lost to date: 23 lbs Total lbs lost since last in-office visit: 9 lbs  Interim History: Danielle Shaw has been following Category 3 and being strict, even over the holidays.  She has a goal for her birthday next year.  She has been able to get all food in.  She says she still has some hunger at the end of the day, so she has been doing a [employer].  She says she has been able to get in all protein.  Subjective:   1. Prediabetes She is doing well on Mounjaro 10 mg.  Denies GI side effects.  2. Essential hypertension BP well controlled today.  Denies chest pain, chest pressure, headache.  3. Vitamin D deficiency Vitamin D is not at goal yet.  Vitamin D level is 25.2.  Assessment/Plan:   1. Prediabetes Danielle Shaw will continue to work on weight loss, exercise, and decreasing simple carbohydrates to help decrease the risk of diabetes. Refill Mounjaro 10 mg subcutaneously weekly.  - Refill tirzepatide (MOUNJARO) 10 MG/0.5ML Pen; Inject 10 mg into the skin once a week.  Dispense: 2 mL; Refill: 0  2. Essential hypertension Danielle Shaw is working on healthy weight loss and exercise to improve blood pressure control. We will watch for signs of hypotension as she continues her lifestyle modifications.  Continue current medications with no change in dose.  As BP allows, will titrate medications down.  3. Vitamin D deficiency Low Vitamin D level contributes to fatigue and are associated with obesity, breast, and colon cancer. She agrees to continue to  take prescription Vitamin D @50 ,000 IU every week and we will check labs at her next appointment.  - Refill Vitamin D, Ergocalciferol, (DRISDOL) 1.25 MG (50000 UNIT) CAPS capsule; Take 1 capsule (50,000 Units total) by mouth every 7 (seven) days.  Dispense: 4 capsule; Refill: 0  4. Obesity with current BMI of 48.1  Danielle Shaw is currently in the action stage of change. As such, her goal is to continue with weight loss efforts. She has agreed to the Category 3 Plan.   Exercise goals:  As is.  Behavioral modification strategies: increasing lean protein intake and meal planning and cooking strategies.  Danielle Shaw has agreed to follow-up with our clinic in 3 weeks, fasting. She was informed of the importance of frequent follow-up visits to maximize her success with intensive lifestyle modifications for her multiple health conditions.   Objective:   Blood pressure 126/85, pulse 86, temperature 98.3 F (36.8 C), height 5\' 5"  (1.651 m), weight 289 lb (131.1 kg), SpO2 99 %. Body mass index is 48.09 kg/m.  General: Cooperative, alert, well developed, in no acute distress. HEENT: Conjunctivae and lids unremarkable. Cardiovascular: Regular rhythm.  Lungs: Normal work of breathing. Neurologic: No focal deficits.   Lab Results  Component Value Date   CREATININE 0.77 04/08/2021   BUN 15 04/08/2021   NA 139 04/08/2021   K 4.2 04/08/2021   CL 99 04/08/2021   CO2 26 04/08/2021   Lab Results  Component Value Date  ALT 23 04/08/2021   AST 19 04/08/2021   ALKPHOS 133 (H) 04/08/2021   BILITOT 0.5 04/08/2021   Lab Results  Component Value Date   HGBA1C 6.3 (H) 04/08/2021   HGBA1C 6.0 (H) 09/27/2018   HGBA1C 6.0 (H) 03/17/2018   HGBA1C 6.2 (H) 12/08/2017   HGBA1C 6.4 12/24/2016   Lab Results  Component Value Date   INSULIN 20.3 04/08/2021   INSULIN 22.0 09/27/2018   INSULIN 16.3 03/17/2018   INSULIN 20.9 12/08/2017   Lab Results  Component Value Date   TSH 1.180 04/08/2021   Lab Results   Component Value Date   CHOL 197 04/08/2021   HDL 49 04/08/2021   LDLCALC 133 (H) 04/08/2021   LDLDIRECT 177 (H) 10/28/2010   TRIG 84 04/08/2021   CHOLHDL 7 12/24/2016   Lab Results  Component Value Date   VD25OH 25.2 (L) 04/08/2021   VD25OH 32.4 09/27/2018   VD25OH 29.3 (L) 03/17/2018   Lab Results  Component Value Date   WBC 5.2 04/08/2021   HGB 13.4 04/08/2021   HCT 41.6 04/08/2021   MCV 89 04/08/2021   PLT 336 04/08/2021   Attestation Statements:   Reviewed by clinician on day of visit: allergies, medications, problem list, medical history, surgical history, family history, social history, and previous encounter notes.  I, Water quality scientist, CMA, am acting as transcriptionist for Coralie Common, MD.  I have reviewed the above documentation for accuracy and completeness, and I agree with the above. - Coralie Common, MD

## 2021-09-01 ENCOUNTER — Ambulatory Visit (INDEPENDENT_AMBULATORY_CARE_PROVIDER_SITE_OTHER): Payer: 59 | Admitting: Family Medicine

## 2021-09-04 ENCOUNTER — Other Ambulatory Visit (INDEPENDENT_AMBULATORY_CARE_PROVIDER_SITE_OTHER): Payer: Self-pay | Admitting: Family Medicine

## 2021-09-04 DIAGNOSIS — R7303 Prediabetes: Secondary | ICD-10-CM

## 2021-09-08 ENCOUNTER — Encounter (INDEPENDENT_AMBULATORY_CARE_PROVIDER_SITE_OTHER): Payer: Self-pay

## 2021-09-08 NOTE — Telephone Encounter (Signed)
Message sent to pt-CAS 

## 2021-09-08 NOTE — Telephone Encounter (Signed)
Last OV with Dr Ukleja 

## 2021-09-14 ENCOUNTER — Other Ambulatory Visit (INDEPENDENT_AMBULATORY_CARE_PROVIDER_SITE_OTHER): Payer: Self-pay | Admitting: Family Medicine

## 2021-09-14 DIAGNOSIS — R7303 Prediabetes: Secondary | ICD-10-CM

## 2021-09-15 ENCOUNTER — Encounter (INDEPENDENT_AMBULATORY_CARE_PROVIDER_SITE_OTHER): Payer: Self-pay | Admitting: Physician Assistant

## 2021-09-15 ENCOUNTER — Ambulatory Visit (INDEPENDENT_AMBULATORY_CARE_PROVIDER_SITE_OTHER): Payer: 59 | Admitting: Physician Assistant

## 2021-09-15 ENCOUNTER — Other Ambulatory Visit: Payer: Self-pay

## 2021-09-15 VITALS — BP 127/82 | HR 73 | Temp 97.8°F | Ht 65.0 in | Wt 280.0 lb

## 2021-09-15 DIAGNOSIS — Z6841 Body Mass Index (BMI) 40.0 and over, adult: Secondary | ICD-10-CM

## 2021-09-15 DIAGNOSIS — Z9189 Other specified personal risk factors, not elsewhere classified: Secondary | ICD-10-CM

## 2021-09-15 DIAGNOSIS — E669 Obesity, unspecified: Secondary | ICD-10-CM

## 2021-09-15 DIAGNOSIS — E559 Vitamin D deficiency, unspecified: Secondary | ICD-10-CM | POA: Diagnosis not present

## 2021-09-15 DIAGNOSIS — E785 Hyperlipidemia, unspecified: Secondary | ICD-10-CM | POA: Diagnosis not present

## 2021-09-15 DIAGNOSIS — R7303 Prediabetes: Secondary | ICD-10-CM

## 2021-09-15 MED ORDER — VITAMIN D (ERGOCALCIFEROL) 1.25 MG (50000 UNIT) PO CAPS: 50000.0000 [IU] | ORAL_CAPSULE | ORAL | 0 refills | Status: DC

## 2021-09-15 MED ORDER — TIRZEPATIDE 10 MG/0.5ML ~~LOC~~ SOAJ: 10.0000 mg | SUBCUTANEOUS | 0 refills | Status: DC

## 2021-09-15 NOTE — Telephone Encounter (Signed)
Last OV today

## 2021-09-15 NOTE — Progress Notes (Signed)
Chief Complaint:   OBESITY Danielle Shaw is here to discuss her progress with her obesity treatment plan along with follow-up of her obesity related diagnoses. Danielle Shaw is on the Category 3 Plan and states she is following her eating plan approximately 90% of the time. Danielle Shaw states she is doing sit ups and leg lifts for 15 minutes 5 times per week.  Today's visit was #: 9 Starting weight: 312 lbs Starting date: 04/08/2021 Today's weight: 280 lbs Today's date: 09/15/2021 Total lbs lost to date: 32 lbs Total lbs lost since last in-office visit: 9 lbs  Interim History: Danielle Shaw has been travelling for work a lot recently but has been packing her food.  She is not hungry on the Mounjaro 10 mg, but makes herself eat all of the food.  Subjective:   1. Prediabetes Danielle Shaw is on Mounjaro 10 mg.  Tolerating well.  She is not hungry but is eating all of her food.  2. Hyperlipidemia, unspecified hyperlipidemia type She is on Crestor.  Tolerating well.  3. Vitamin D deficiency She is on vitamin D.  Tolerating well.  4. At risk for diabetes mellitus Danielle Shaw is at higher than average risk for developing diabetes due to obesity.   Assessment/Plan:   1. Prediabetes Refill Mounjaro today.  Will also check labs.  Danielle Shaw will continue to work on weight loss, exercise, and decreasing simple carbohydrates to help decrease the risk of diabetes.   - Refill tirzepatide (MOUNJARO) 10 MG/0.5ML Pen; Inject 10 mg into the skin once a week.  Dispense: 2 mL; Refill: 0 - Comprehensive metabolic panel - Hemoglobin A1c - Insulin, random  2. Hyperlipidemia, unspecified hyperlipidemia type Continue Crestor.  Check labs today.  Cardiovascular risk and specific lipid/LDL goals reviewed.  We discussed several lifestyle modifications today and Danielle Shaw will continue to work on diet, exercise and weight loss efforts. Orders and follow up as documented in patient record.   Counseling Intensive lifestyle modifications are the  first line treatment for this issue. Dietary changes: Increase soluble fiber. Decrease simple carbohydrates. Exercise changes: Moderate to vigorous-intensity aerobic activity 150 minutes per week if tolerated. Lipid-lowering medications: see documented in medical record.  - Lipid panel  3. Vitamin D deficiency Low Vitamin D level contributes to fatigue and are associated with obesity, breast, and colon cancer. She agrees to continue to take prescription Vitamin D @50 ,000 IU every week and we will check her vitamin D level today.  - VITAMIN D 25 Hydroxy (Vit-D Deficiency, Fractures) - Refill Vitamin D, Ergocalciferol, (DRISDOL) 1.25 MG (50000 UNIT) CAPS capsule; Take 1 capsule (50,000 Units total) by mouth every 7 (seven) days.  Dispense: 4 capsule; Refill: 0  4. At risk for diabetes mellitus Danielle Shaw was given approximately 15 minutes of diabetic education and counseling today. We discussed intensive lifestyle modifications today with an emphasis on weight loss as well as increasing exercise and decreasing simple carbohydrates in her diet. We also reviewed medication options with an emphasis on risk versus benefits of those discussed.  Repetitive spaced learning was employed today to elicit superior memory formation and behavioral change.  5. Obesity with current BMI of 46.59  Danielle Shaw is currently in the action stage of change. As such, her goal is to continue with weight loss efforts. She has agreed to the Category 3 Plan.   Exercise goals:  As is.  Behavioral modification strategies: meal planning and cooking strategies and better snacking choices.  Danielle Shaw has agreed to follow-up with our clinic in 2-3 weeks.  She was informed of the importance of frequent follow-up visits to maximize her success with intensive lifestyle modifications for her multiple health conditions.   Danielle Shaw was informed we would discuss her lab results at her next visit unless there is a critical issue that needs to be  addressed sooner. Danielle Shaw agreed to keep her next visit at the agreed upon time to discuss these results.  Objective:   Blood pressure 127/82, pulse 73, temperature 97.8 F (36.6 C), height 5\' 5"  (1.651 m), weight 280 lb (127 kg), SpO2 100 %. Body mass index is 46.59 kg/m.  General: Cooperative, alert, well developed, in no acute distress. HEENT: Conjunctivae and lids unremarkable. Cardiovascular: Regular rhythm.  Lungs: Normal work of breathing. Neurologic: No focal deficits.   Lab Results  Component Value Date   CREATININE 0.77 04/08/2021   BUN 15 04/08/2021   NA 139 04/08/2021   K 4.2 04/08/2021   CL 99 04/08/2021   CO2 26 04/08/2021   Lab Results  Component Value Date   ALT 23 04/08/2021   AST 19 04/08/2021   ALKPHOS 133 (H) 04/08/2021   BILITOT 0.5 04/08/2021   Lab Results  Component Value Date   HGBA1C 6.3 (H) 04/08/2021   HGBA1C 6.0 (H) 09/27/2018   HGBA1C 6.0 (H) 03/17/2018   HGBA1C 6.2 (H) 12/08/2017   HGBA1C 6.4 12/24/2016   Lab Results  Component Value Date   INSULIN 20.3 04/08/2021   INSULIN 22.0 09/27/2018   INSULIN 16.3 03/17/2018   INSULIN 20.9 12/08/2017   Lab Results  Component Value Date   TSH 1.180 04/08/2021   Lab Results  Component Value Date   CHOL 197 04/08/2021   HDL 49 04/08/2021   LDLCALC 133 (H) 04/08/2021   LDLDIRECT 177 (H) 10/28/2010   TRIG 84 04/08/2021   CHOLHDL 7 12/24/2016   Lab Results  Component Value Date   VD25OH 25.2 (L) 04/08/2021   VD25OH 32.4 09/27/2018   VD25OH 29.3 (L) 03/17/2018   Lab Results  Component Value Date   WBC 5.2 04/08/2021   HGB 13.4 04/08/2021   HCT 41.6 04/08/2021   MCV 89 04/08/2021   PLT 336 04/08/2021   Attestation Statements:   Reviewed by clinician on day of visit: allergies, medications, problem list, medical history, surgical history, family history, social history, and previous encounter notes.  I, Water quality scientist, CMA, am acting as transcriptionist for Abby Potash, PA-C  I  have reviewed the above documentation for accuracy and completeness, and I agree with the above. Abby Potash, PA-C

## 2021-09-16 ENCOUNTER — Other Ambulatory Visit (HOSPITAL_BASED_OUTPATIENT_CLINIC_OR_DEPARTMENT_OTHER): Payer: Self-pay

## 2021-09-16 ENCOUNTER — Other Ambulatory Visit (INDEPENDENT_AMBULATORY_CARE_PROVIDER_SITE_OTHER): Payer: Self-pay

## 2021-09-16 DIAGNOSIS — R7303 Prediabetes: Secondary | ICD-10-CM

## 2021-09-16 LAB — COMPREHENSIVE METABOLIC PANEL
ALT: 13 IU/L (ref 0–32)
AST: 15 IU/L (ref 0–40)
Albumin/Globulin Ratio: 1.7 (ref 1.2–2.2)
Albumin: 4.8 g/dL (ref 3.8–4.8)
Alkaline Phosphatase: 110 IU/L (ref 44–121)
BUN/Creatinine Ratio: 16 (ref 9–23)
BUN: 13 mg/dL (ref 6–24)
Bilirubin Total: 0.4 mg/dL (ref 0.0–1.2)
CO2: 25 mmol/L (ref 20–29)
Calcium: 10 mg/dL (ref 8.7–10.2)
Chloride: 102 mmol/L (ref 96–106)
Creatinine, Ser: 0.79 mg/dL (ref 0.57–1.00)
Globulin, Total: 2.8 g/dL (ref 1.5–4.5)
Glucose: 79 mg/dL (ref 70–99)
Potassium: 4.2 mmol/L (ref 3.5–5.2)
Sodium: 141 mmol/L (ref 134–144)
Total Protein: 7.6 g/dL (ref 6.0–8.5)
eGFR: 92 mL/min/{1.73_m2} (ref >59)

## 2021-09-16 LAB — LIPID PANEL
Chol/HDL Ratio: 3.5 ratio (ref 0.0–4.4)
Cholesterol, Total: 149 mg/dL (ref 100–199)
HDL: 42 mg/dL (ref >39)
LDL Chol Calc (NIH): 90 mg/dL (ref 0–99)
Triglycerides: 93 mg/dL (ref 0–149)
VLDL Cholesterol Cal: 17 mg/dL (ref 5–40)

## 2021-09-16 LAB — HEMOGLOBIN A1C
Est. average glucose Bld gHb Est-mCnc: 114 mg/dL
Hgb A1c MFr Bld: 5.6 % (ref 4.8–5.6)

## 2021-09-16 LAB — VITAMIN D 25 HYDROXY (VIT D DEFICIENCY, FRACTURES): Vit D, 25-Hydroxy: 39.4 ng/mL (ref 30.0–100.0)

## 2021-09-16 LAB — INSULIN, RANDOM: INSULIN: 14.4 u[IU]/mL (ref 2.6–24.9)

## 2021-09-16 MED ORDER — TIRZEPATIDE 10 MG/0.5ML ~~LOC~~ SOAJ
10.0000 mg | SUBCUTANEOUS | 0 refills | Status: DC
  Filled 2021-09-16: qty 2, 28d supply, fill #0

## 2021-10-06 ENCOUNTER — Telehealth: Payer: Self-pay | Admitting: Neurology

## 2021-10-06 NOTE — Telephone Encounter (Signed)
Pt has called to report she has to go out of town for work and is guess that she will be back in town between 04-03 and 04-10.  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

## 2021-10-06 NOTE — Telephone Encounter (Signed)
Called pt back. I pulled data off icode website. Showing 10/30days compliance. She states mask did not work well but had to wait to get new mask until due for new supplies per [employer]. She just got new mask over the weekend and has been using since. Tolerating ok. Aware to try and use at least 4 hr or more each night. Needing to see better compliance in order for insurance to continue to cover. She will make sure to scan app regularly for [place] to have up to date data. Scheduled virtual visit w/ JM,NP 11/03/21 at 2:30pm (scheduled as 30 min). She will call if anything changes prior to appt. ? ? ?

## 2021-10-08 ENCOUNTER — Ambulatory Visit: Payer: 59 | Admitting: Neurology

## 2021-10-13 ENCOUNTER — Ambulatory Visit (INDEPENDENT_AMBULATORY_CARE_PROVIDER_SITE_OTHER): Payer: 59 | Admitting: Family Medicine

## 2021-10-15 ENCOUNTER — Other Ambulatory Visit (INDEPENDENT_AMBULATORY_CARE_PROVIDER_SITE_OTHER): Payer: Self-pay | Admitting: Physician Assistant

## 2021-10-15 ENCOUNTER — Other Ambulatory Visit (HOSPITAL_BASED_OUTPATIENT_CLINIC_OR_DEPARTMENT_OTHER): Payer: Self-pay

## 2021-10-15 DIAGNOSIS — R7303 Prediabetes: Secondary | ICD-10-CM

## 2021-10-16 ENCOUNTER — Other Ambulatory Visit (HOSPITAL_BASED_OUTPATIENT_CLINIC_OR_DEPARTMENT_OTHER): Payer: Self-pay

## 2021-10-16 ENCOUNTER — Encounter (HOSPITAL_BASED_OUTPATIENT_CLINIC_OR_DEPARTMENT_OTHER): Payer: Self-pay | Admitting: Pharmacist

## 2021-10-16 NOTE — Telephone Encounter (Signed)
Danielle Shaw 

## 2021-10-21 ENCOUNTER — Encounter (INDEPENDENT_AMBULATORY_CARE_PROVIDER_SITE_OTHER): Payer: Self-pay | Admitting: Physician Assistant

## 2021-10-21 ENCOUNTER — Encounter (INDEPENDENT_AMBULATORY_CARE_PROVIDER_SITE_OTHER): Payer: Self-pay | Admitting: Family Medicine

## 2021-10-21 ENCOUNTER — Ambulatory Visit (INDEPENDENT_AMBULATORY_CARE_PROVIDER_SITE_OTHER): Payer: 59 | Admitting: Physician Assistant

## 2021-10-21 ENCOUNTER — Other Ambulatory Visit: Payer: Self-pay

## 2021-10-21 ENCOUNTER — Other Ambulatory Visit (HOSPITAL_BASED_OUTPATIENT_CLINIC_OR_DEPARTMENT_OTHER): Payer: Self-pay

## 2021-10-21 VITALS — BP 112/79 | HR 70 | Temp 97.8°F | Ht 65.0 in | Wt 274.0 lb

## 2021-10-21 DIAGNOSIS — Z6841 Body Mass Index (BMI) 40.0 and over, adult: Secondary | ICD-10-CM

## 2021-10-21 DIAGNOSIS — R7303 Prediabetes: Secondary | ICD-10-CM | POA: Diagnosis not present

## 2021-10-21 DIAGNOSIS — E669 Obesity, unspecified: Secondary | ICD-10-CM | POA: Diagnosis not present

## 2021-10-21 DIAGNOSIS — Z9189 Other specified personal risk factors, not elsewhere classified: Secondary | ICD-10-CM

## 2021-10-21 DIAGNOSIS — I10 Essential (primary) hypertension: Secondary | ICD-10-CM

## 2021-10-21 MED ORDER — TIRZEPATIDE 10 MG/0.5ML ~~LOC~~ SOAJ
10.0000 mg | SUBCUTANEOUS | 0 refills | Status: DC
  Filled 2021-10-21: qty 2, 28d supply, fill #0

## 2021-10-21 MED ORDER — TIRZEPATIDE 12.5 MG/0.5ML ~~LOC~~ SOAJ
12.5000 mg | SUBCUTANEOUS | 0 refills | Status: DC
  Filled 2021-10-21: qty 2, 28d supply, fill #1
  Filled 2021-10-21: qty 2, 28d supply, fill #0
  Filled 2021-11-12: qty 2, 28d supply, fill #1
  Filled 2021-12-16 – 2021-12-26 (×3): qty 2, 28d supply, fill #2
  Filled 2022-02-16: qty 2, 28d supply, fill #3

## 2021-10-21 NOTE — Progress Notes (Signed)
? ? ? ?Chief Complaint:  ? ?OBESITY ?Danielle Shaw is here to discuss her progress with her obesity treatment plan along with follow-up of her obesity related diagnoses. Danielle Shaw is on the Category 3 Plan and states she is following her eating plan approximately 90% of the time. Danielle Shaw states she is doing gym exercise and walking for 30 minutes 2-3 times per week. ? ?Today's visit was #: 10 ?Starting weight: 312 lbs ?Starting date: 04/08/2021 ?Today's weight: 274 lbs ?Today's date: 10/21/2021 ?Total lbs lost to date: 38 lbs ?Total lbs lost since last in-office visit: 6 lbs ? ?Interim History: Danielle Shaw has traveled the last month for work. She has been making good choices when eating out. She is skipping lunch or eating a snack in the middle of the day.  ? ?Subjective:  ? ?1. Prediabetes ?Danielle Shaw is currently on Mounjaro 10 mg. Her appetite is controlled.  ? ?2. Essential hypertension ?Danielle Shaw's blood pressure is well controlled. She denies dizziness or lightheadedness. Her blood pressure is managed by her primary care physician. We discussed checking blood pressure and cutting medication in half if needed.  ? ?3. At risk for deficient intake of food ?The patient is at a higher than average risk of deficient intake of food due to skipping lunch.  ? ?Assessment/Plan:  ? ?1. Prediabetes ?We will refill Mounjaro 10 mg for 3 months with no refills. Danielle Shaw will continue to work on weight loss, exercise, and decreasing simple carbohydrates to help decrease the risk of diabetes.  ? ?2. Essential hypertension ?Danielle Shaw will not take her blood pressure medication if her blood pressure is < than 100/60. We will continue to check her blood pressure each visit. She opt to agree to check blood pressure at home and record and bring in at next visit. She is working on healthy weight loss and exercise to improve blood pressure control. We will watch for signs of hypotension as she continues her lifestyle modifications. ? ?3. At risk for deficient intake of  food ?Danielle Shaw was given approximately 15 minutes of deficient intake of food prevention counseling today. Danielle Shaw is at risk for eating too few calories based on current food recall. She was encouraged to focus on meeting caloric and protein goals according to her recommended meal plan.  ? ?4. Obesity with current BMI of 45.6 ?Danielle Shaw is currently in the action stage of change. As such, her goal is to continue with weight loss efforts. She has agreed to the Category 3 Plan.  ? ?Exercise goals:  As is. ? ?Behavioral modification strategies: no skipping meals and meal planning and cooking strategies. ? ?Danielle Shaw has agreed to follow-up with our clinic in 2 weeks. She was informed of the importance of frequent follow-up visits to maximize her success with intensive lifestyle modifications for her multiple health conditions.  ? ?Objective:  ? ?Blood pressure 112/79, pulse 70, temperature 97.8 ?F (36.6 ?C), height '5\' 5"'$  (1.651 m), weight 274 lb (124.3 kg), SpO2 97 %. ?Body mass index is 45.6 kg/m?. ? ?General: Cooperative, alert, well developed, in no acute distress. ?HEENT: Conjunctivae and lids unremarkable. ?Cardiovascular: Regular rhythm.  ?Lungs: Normal work of breathing. ?Neurologic: No focal deficits.  ? ?Lab Results  ?Component Value Date  ? CREATININE 0.79 09/15/2021  ? BUN 13 09/15/2021  ? NA 141 09/15/2021  ? K 4.2 09/15/2021  ? CL 102 09/15/2021  ? CO2 25 09/15/2021  ? ?Lab Results  ?Component Value Date  ? ALT 13 09/15/2021  ? AST 15 09/15/2021  ?  ALKPHOS 110 09/15/2021  ? BILITOT 0.4 09/15/2021  ? ?Lab Results  ?Component Value Date  ? HGBA1C 5.6 09/15/2021  ? HGBA1C 6.3 (H) 04/08/2021  ? HGBA1C 6.0 (H) 09/27/2018  ? HGBA1C 6.0 (H) 03/17/2018  ? HGBA1C 6.2 (H) 12/08/2017  ? ?Lab Results  ?Component Value Date  ? INSULIN 14.4 09/15/2021  ? INSULIN 20.3 04/08/2021  ? INSULIN 22.0 09/27/2018  ? INSULIN 16.3 03/17/2018  ? INSULIN 20.9 12/08/2017  ? ?Lab Results  ?Component Value Date  ? TSH 1.180 04/08/2021  ? ?Lab  Results  ?Component Value Date  ? CHOL 149 09/15/2021  ? HDL 42 09/15/2021  ? Danielle Shaw 90 09/15/2021  ? LDLDIRECT 177 (H) 10/28/2010  ? TRIG 93 09/15/2021  ? CHOLHDL 3.5 09/15/2021  ? ?Lab Results  ?Component Value Date  ? VD25OH 39.4 09/15/2021  ? VD25OH 25.2 (L) 04/08/2021  ? VD25OH 32.4 09/27/2018  ? ?Lab Results  ?Component Value Date  ? WBC 5.2 04/08/2021  ? HGB 13.4 04/08/2021  ? HCT 41.6 04/08/2021  ? MCV 89 04/08/2021  ? PLT 336 04/08/2021  ? ?No results found for: IRON, TIBC, FERRITIN ? ?Attestation Statements:  ? ?Reviewed by clinician on day of visit: allergies, medications, problem list, medical history, surgical history, family history, social history, and previous encounter notes. ? ?I, Tonye Pearson, am acting as Location manager for [employer], PA-C. ? ?I have reviewed the above documentation for accuracy and completeness, and I agree with the above. Abby Potash, PA-C ? ?

## 2021-10-21 NOTE — Telephone Encounter (Signed)
Ok to send 12.'5mg'$  dose for 3 months

## 2021-10-21 NOTE — Telephone Encounter (Signed)
Danielle Shaw 

## 2021-10-21 NOTE — Telephone Encounter (Signed)
Saw Danielle Shaw today ?

## 2021-10-21 NOTE — Telephone Encounter (Signed)
See previous mychart message

## 2021-10-21 NOTE — Telephone Encounter (Signed)
Go ahead and send in the 12.'5mg'$  for 3 month supply please

## 2021-10-23 ENCOUNTER — Ambulatory Visit (INDEPENDENT_AMBULATORY_CARE_PROVIDER_SITE_OTHER): Payer: 59 | Admitting: Family Medicine

## 2021-10-29 ENCOUNTER — Encounter (INDEPENDENT_AMBULATORY_CARE_PROVIDER_SITE_OTHER): Payer: Self-pay

## 2021-11-03 ENCOUNTER — Telehealth (INDEPENDENT_AMBULATORY_CARE_PROVIDER_SITE_OTHER): Payer: 59 | Admitting: Adult Health

## 2021-11-03 ENCOUNTER — Encounter: Payer: Self-pay | Admitting: Adult Health

## 2021-11-03 DIAGNOSIS — G4733 Obstructive sleep apnea (adult) (pediatric): Secondary | ICD-10-CM | POA: Diagnosis not present
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
## 2021-11-06 ENCOUNTER — Ambulatory Visit (INDEPENDENT_AMBULATORY_CARE_PROVIDER_SITE_OTHER): Payer: 59 | Admitting: Family Medicine

## 2021-11-06 ENCOUNTER — Encounter (INDEPENDENT_AMBULATORY_CARE_PROVIDER_SITE_OTHER): Payer: Self-pay | Admitting: Family Medicine

## 2021-11-06 VITALS — BP 114/78 | HR 72 | Temp 97.9°F | Ht 65.0 in | Wt 270.0 lb

## 2021-11-06 DIAGNOSIS — R7303 Prediabetes: Secondary | ICD-10-CM

## 2021-11-06 DIAGNOSIS — I10 Essential (primary) hypertension: Secondary | ICD-10-CM

## 2021-11-06 DIAGNOSIS — E669 Obesity, unspecified: Secondary | ICD-10-CM

## 2021-11-06 DIAGNOSIS — Z6841 Body Mass Index (BMI) 40.0 and over, adult: Secondary | ICD-10-CM

## 2021-11-06 DIAGNOSIS — E559 Vitamin D deficiency, unspecified: Secondary | ICD-10-CM

## 2021-11-06 MED ORDER — VITAMIN D (ERGOCALCIFEROL) 1.25 MG (50000 UNIT) PO CAPS: 50000.0000 [IU] | ORAL_CAPSULE | ORAL | 0 refills | Status: DC

## 2021-11-12 ENCOUNTER — Other Ambulatory Visit (HOSPITAL_BASED_OUTPATIENT_CLINIC_OR_DEPARTMENT_OTHER): Payer: Self-pay

## 2021-11-13 NOTE — Progress Notes (Signed)
? ? ? ?Chief Complaint:  ? ?OBESITY ?Danielle Shaw is here to discuss her progress with her obesity treatment plan along with follow-up of her obesity related diagnoses. Danielle Shaw is on the Category 3 Plan and states she is following her eating plan approximately 90% of the time. Danielle Shaw states she is biking for 30 minutes 3 times per week, walking for 15-20 minutes 5 times per week, and exercise with videos for 10-15 minutes 3-4 times per week. ? ?Today's visit was #: 51 ?Starting weight: 312 lbs ?Starting date: 04/08/2021 ?Today's weight: 270 lbs ?Today's date: 11/06/2021 ?Total lbs lost to date: 19 ?Total lbs lost since last in-office visit: 4 ? ?Interim History: Danielle Shaw has been in [place] working out of town for the last few weeks. Easier following the plan this time around than it was previously. She also had some food in her room to eat more on the plan. No upcoming plans for events or travel.  ? ?Subjective:  ? ?1. Vitamin D deficiency ?Danielle Shaw denies nausea, vomiting, or muscle weakness, but notes fatigue. She is on prescription Vitamin D. ? ?2. Prediabetes ?Danielle Shaw's last A1c was 6.3 and insulin 20.3. She is doing very well on Mounjaro, with significant improvement in A1c. She denies GI side effects.  ? ?3. Essential hypertension ?Danielle Shaw's blood pressure is well controlled. She denies chest pain, chest pressure, or headache. ? ?Assessment/Plan:  ? ?1. Vitamin D deficiency ?We will refill prescription Vitamin D 50,000 IU weekly for 1 month. ? ?- Vitamin D, Ergocalciferol, (DRISDOL) 1.25 MG (50000 UNIT) CAPS capsule; Take 1 capsule (50,000 Units total) by mouth every 7 (seven) days.  Dispense: 4 capsule; Refill: 0 ? ?2. Prediabetes ?Danielle Shaw will continue Mounjaro, no change in dose.  ? ?3. Essential hypertension ?We will continue to watch and follow up on Danielle Shaw's blood pressure at her next appointments. If her blood pressure is controlled we will consider decreasing her medications. ? ?4. Obesity with current BMI of  45.0 ?Danielle Shaw is currently in the action stage of change. As such, her goal is to continue with weight loss efforts. She has agreed to the Category 3 Plan.  ? ?Exercise goals: As is, patient is to start implementing more resistance training 3 times per week.  ? ?Behavioral modification strategies: increasing lean protein intake, meal planning and cooking strategies, and keeping healthy foods in the home. ? ?Danielle Shaw has agreed to follow-up with our clinic in 3 weeks. She was informed of the importance of frequent follow-up visits to maximize her success with intensive lifestyle modifications for her multiple health conditions.  ? ?Objective:  ? ?Blood pressure 114/78, pulse 72, temperature 97.9 ?F (36.6 ?C), height '5\' 5"'$  (1.651 m), weight 270 lb (122.5 kg), SpO2 98 %. ?Body mass index is 44.93 kg/m?. ? ?General: Cooperative, alert, well developed, in no acute distress. ?HEENT: Conjunctivae and lids unremarkable. ?Cardiovascular: Regular rhythm.  ?Lungs: Normal work of breathing. ?Neurologic: No focal deficits.  ? ?Lab Results  ?Component Value Date  ? CREATININE 0.79 09/15/2021  ? BUN 13 09/15/2021  ? NA 141 09/15/2021  ? K 4.2 09/15/2021  ? CL 102 09/15/2021  ? CO2 25 09/15/2021  ? ?Lab Results  ?Component Value Date  ? ALT 13 09/15/2021  ? AST 15 09/15/2021  ? ALKPHOS 110 09/15/2021  ? BILITOT 0.4 09/15/2021  ? ?Lab Results  ?Component Value Date  ? HGBA1C 5.6 09/15/2021  ? HGBA1C 6.3 (H) 04/08/2021  ? HGBA1C 6.0 (H) 09/27/2018  ? HGBA1C 6.0 (H) 03/17/2018  ?  HGBA1C 6.2 (H) 12/08/2017  ? ?Lab Results  ?Component Value Date  ? INSULIN 14.4 09/15/2021  ? INSULIN 20.3 04/08/2021  ? INSULIN 22.0 09/27/2018  ? INSULIN 16.3 03/17/2018  ? INSULIN 20.9 12/08/2017  ? ?Lab Results  ?Component Value Date  ? TSH 1.180 04/08/2021  ? ?Lab Results  ?Component Value Date  ? CHOL 149 09/15/2021  ? HDL 42 09/15/2021  ? [place] 90 09/15/2021  ? LDLDIRECT 177 (H) 10/28/2010  ? TRIG 93 09/15/2021  ? CHOLHDL 3.5 09/15/2021  ? ?Lab Results   ?Component Value Date  ? VD25OH 39.4 09/15/2021  ? VD25OH 25.2 (L) 04/08/2021  ? VD25OH 32.4 09/27/2018  ? ?Lab Results  ?Component Value Date  ? WBC 5.2 04/08/2021  ? HGB 13.4 04/08/2021  ? HCT 41.6 04/08/2021  ? MCV 89 04/08/2021  ? PLT 336 04/08/2021  ? ?No results found for: IRON, TIBC, FERRITIN ? ?Attestation Statements:  ? ?Reviewed by clinician on day of visit: allergies, medications, problem list, medical history, surgical history, family history, social history, and previous encounter notes. ? ? ?I, Trixie Dredge, am acting as transcriptionist for Coralie Common, MD. ? ?I have reviewed the above documentation for accuracy and completeness, and I agree with the above. Coralie Common, MD ? ? ?

## 2021-12-02 ENCOUNTER — Telehealth (INDEPENDENT_AMBULATORY_CARE_PROVIDER_SITE_OTHER): Payer: 59 | Admitting: Family Medicine

## 2021-12-02 ENCOUNTER — Encounter (INDEPENDENT_AMBULATORY_CARE_PROVIDER_SITE_OTHER): Payer: Self-pay | Admitting: Family Medicine

## 2021-12-02 VITALS — Ht 65.0 in | Wt 264.0 lb

## 2021-12-02 DIAGNOSIS — Z6841 Body Mass Index (BMI) 40.0 and over, adult: Secondary | ICD-10-CM | POA: Diagnosis not present

## 2021-12-02 DIAGNOSIS — E559 Vitamin D deficiency, unspecified: Secondary | ICD-10-CM

## 2021-12-02 DIAGNOSIS — E669 Obesity, unspecified: Secondary | ICD-10-CM

## 2021-12-09 NOTE — Progress Notes (Signed)
TeleHealth Visit:  Due to the COVID-19 pandemic, this visit was completed with telemedicine (audio/video) technology to reduce patient and provider exposure as well as to preserve personal protective equipment.   Danielle Shaw has verbally consented to this TeleHealth visit. The patient is located at home, the provider is located at the [facility] office. The participants in this visit include the listed provider and patient. The visit was conducted today via [facility].   Chief Complaint: OBESITY Danielle Shaw is here to discuss her progress with her obesity treatment plan along with follow-up of her obesity related diagnoses. Danielle Shaw is on the Category 3 Plan and states she is following her eating plan approximately 85% of the time. Danielle Shaw states she is doing resistance bands 15 minutes 2 times per week.  Today's visit was #: 12 Starting weight: 312 lbs Starting date: 04/08/2021  Interim History: Danielle Shaw has been traveling quite for work - staying between 3-4 days at a time. She found it a bit harder to stick on plan this time around. Some hotels didn't have a fridge. Wants to be more adherent to plan for next few weeks.  Subjective:   1. Vitamin D deficiency Danielle Shaw is currently taking prescription Vit D. Notes fatigue. Her last Vit D level at 39.4  Assessment/Plan:   1. Vitamin D deficiency Danielle Shaw will continue taking Vit D prescription with no refills needed today.  2. Obesity with current BMI of 44.1 Danielle Shaw is currently in the action stage of change. As such, her goal is to continue with weight loss efforts. She has agreed to the Category 3 Plan.   Exercise goals: As is.  Behavioral modification strategies: increasing lean protein intake, meal planning and cooking strategies, keeping healthy foods in the home, and travel eating strategies.  Danielle Shaw has agreed to follow-up with our clinic in 3 weeks. She was informed of the importance of frequent follow-up visits to maximize her success  with intensive lifestyle modifications for her multiple health conditions.  Objective:   VITALS: Per patient if applicable, see vitals. GENERAL: Alert and in no acute distress. CARDIOPULMONARY: No increased WOB. Speaking in clear sentences.  PSYCH: Pleasant and cooperative. Speech normal rate and rhythm. Affect is appropriate. Insight and judgement are appropriate. Attention is focused, linear, and appropriate.  NEURO: Oriented as arrived to appointment on time with no prompting.   Lab Results  Component Value Date   CREATININE 0.79 09/15/2021   BUN 13 09/15/2021   NA 141 09/15/2021   K 4.2 09/15/2021   CL 102 09/15/2021   CO2 25 09/15/2021   Lab Results  Component Value Date   ALT 13 09/15/2021   AST 15 09/15/2021   ALKPHOS 110 09/15/2021   BILITOT 0.4 09/15/2021   Lab Results  Component Value Date   HGBA1C 5.6 09/15/2021   HGBA1C 6.3 (H) 04/08/2021   HGBA1C 6.0 (H) 09/27/2018   HGBA1C 6.0 (H) 03/17/2018   HGBA1C 6.2 (H) 12/08/2017   Lab Results  Component Value Date   INSULIN 14.4 09/15/2021   INSULIN 20.3 04/08/2021   INSULIN 22.0 09/27/2018   INSULIN 16.3 03/17/2018   INSULIN 20.9 12/08/2017   Lab Results  Component Value Date   TSH 1.180 04/08/2021   Lab Results  Component Value Date   CHOL 149 09/15/2021   HDL 42 09/15/2021   LDLCALC 90 09/15/2021   LDLDIRECT 177 (H) 10/28/2010   TRIG 93 09/15/2021   CHOLHDL 3.5 09/15/2021   Lab Results  Component Value Date  VD25OH 39.4 09/15/2021   VD25OH 25.2 (L) 04/08/2021   VD25OH 32.4 09/27/2018   Lab Results  Component Value Date   WBC 5.2 04/08/2021   HGB 13.4 04/08/2021   HCT 41.6 04/08/2021   MCV 89 04/08/2021   PLT 336 04/08/2021   No results found for: IRON, TIBC, FERRITIN  Attestation Statements:   Reviewed by clinician on day of visit: allergies, medications, problem list, medical history, surgical history, family history, social history, and previous encounter notes.  I, Elnora Morrison,  RMA am acting as transcriptionist for Coralie Common, MD. I have reviewed the above documentation for accuracy and completeness, and I agree with the above. - Coralie Common, MD

## 2021-12-12 ENCOUNTER — Telehealth: Payer: Self-pay | Admitting: Internal Medicine

## 2021-12-12 ENCOUNTER — Other Ambulatory Visit (HOSPITAL_BASED_OUTPATIENT_CLINIC_OR_DEPARTMENT_OTHER): Payer: Self-pay

## 2021-12-12 ENCOUNTER — Other Ambulatory Visit: Payer: Self-pay

## 2021-12-12 DIAGNOSIS — I10 Essential (primary) hypertension: Secondary | ICD-10-CM

## 2021-12-12 MED ORDER — ROSUVASTATIN CALCIUM 20 MG PO TABS
20.0000 mg | ORAL_TABLET | Freq: Every day | ORAL | 0 refills | Status: DC
  Filled 2021-12-12: qty 30, 30d supply, fill #0

## 2021-12-12 NOTE — Telephone Encounter (Signed)
Pt requesting refills of rosuvastatin (CRESTOR) 20 MG tablet, losartan (COZAAR) 100 MG tablet, spironolactone (ALDACTONE) 25 MG tablet   [place] Phone:  [phone]  Fax:  [phone]    Pt has OV scheduled for 12/24/21

## 2021-12-12 NOTE — Telephone Encounter (Signed)
Medication sent for 30 days with 0 refill. Patient needs to keep appt on 5/31.

## 2021-12-14 ENCOUNTER — Other Ambulatory Visit (HOSPITAL_COMMUNITY)
Admission: RE | Admit: 2021-12-14 | Discharge: 2021-12-14 | Disposition: A | Discharge status: Home / Self Care | Payer: 59 | Source: Ambulatory Visit | Attending: Internal Medicine | Admitting: Internal Medicine

## 2021-12-14 DIAGNOSIS — Z124 Encounter for screening for malignant neoplasm of cervix: Secondary | ICD-10-CM | POA: Insufficient documentation

## 2021-12-17 ENCOUNTER — Other Ambulatory Visit (HOSPITAL_BASED_OUTPATIENT_CLINIC_OR_DEPARTMENT_OTHER): Payer: Self-pay

## 2021-12-18 ENCOUNTER — Other Ambulatory Visit (HOSPITAL_BASED_OUTPATIENT_CLINIC_OR_DEPARTMENT_OTHER): Payer: Self-pay

## 2021-12-23 ENCOUNTER — Encounter: Payer: 59 | Admitting: Internal Medicine

## 2021-12-23 ENCOUNTER — Other Ambulatory Visit (HOSPITAL_BASED_OUTPATIENT_CLINIC_OR_DEPARTMENT_OTHER): Payer: Self-pay

## 2021-12-24 ENCOUNTER — Other Ambulatory Visit (HOSPITAL_BASED_OUTPATIENT_CLINIC_OR_DEPARTMENT_OTHER): Payer: Self-pay

## 2021-12-24 ENCOUNTER — Ambulatory Visit (INDEPENDENT_AMBULATORY_CARE_PROVIDER_SITE_OTHER): Payer: 59 | Admitting: Internal Medicine

## 2021-12-24 VITALS — BP 132/92 | HR 69 | Temp 97.9°F | Ht 65.5 in | Wt 264.9 lb

## 2021-12-24 DIAGNOSIS — Z0000 Encounter for general adult medical examination without abnormal findings: Secondary | ICD-10-CM | POA: Diagnosis not present

## 2021-12-24 DIAGNOSIS — Z124 Encounter for screening for malignant neoplasm of cervix: Secondary | ICD-10-CM | POA: Diagnosis present

## 2021-12-24 DIAGNOSIS — R7302 Impaired glucose tolerance (oral): Secondary | ICD-10-CM

## 2021-12-24 DIAGNOSIS — E785 Hyperlipidemia, unspecified: Secondary | ICD-10-CM

## 2021-12-24 DIAGNOSIS — F1190 Opioid use, unspecified, uncomplicated: Secondary | ICD-10-CM

## 2021-12-24 DIAGNOSIS — G4737 Central sleep apnea in conditions classified elsewhere: Secondary | ICD-10-CM

## 2021-12-24 DIAGNOSIS — I10 Essential (primary) hypertension: Secondary | ICD-10-CM | POA: Diagnosis not present

## 2021-12-24 LAB — CBC WITH DIFFERENTIAL/PLATELET
Basophils Absolute: 0 10*3/uL (ref 0.0–0.1)
Basophils Relative: 0.9 % (ref 0.0–3.0)
Eosinophils Absolute: 0.2 10*3/uL (ref 0.0–0.7)
Eosinophils Relative: 5.6 % — ABNORMAL HIGH (ref 0.0–5.0)
HCT: 36.9 % (ref 36.0–46.0)
Hemoglobin: 12.6 g/dL (ref 12.0–15.0)
Lymphocytes Relative: 47.2 % — ABNORMAL HIGH (ref 12.0–46.0)
Lymphs Abs: 1.8 10*3/uL (ref 0.7–4.0)
MCHC: 34.1 g/dL (ref 30.0–36.0)
MCV: 86.1 fl (ref 78.0–100.0)
Monocytes Absolute: 0.2 10*3/uL (ref 0.1–1.0)
Monocytes Relative: 6.4 % (ref 3.0–12.0)
Neutro Abs: 1.5 10*3/uL (ref 1.4–7.7)
Neutrophils Relative %: 39.9 % — ABNORMAL LOW (ref 43.0–77.0)
Platelets: 303 10*3/uL (ref 150.0–400.0)
RBC: 4.28 Mil/uL (ref 3.87–5.11)
RDW: 14.6 % (ref 11.5–15.5)
WBC: 3.9 10*3/uL — ABNORMAL LOW (ref 4.0–10.5)

## 2021-12-24 LAB — LIPID PANEL
Cholesterol: 241 mg/dL — ABNORMAL HIGH (ref 0–200)
HDL: 47.3 mg/dL (ref >39.00)
LDL Cholesterol: 178 mg/dL — ABNORMAL HIGH (ref 0–99)
NonHDL: 194.04
Total CHOL/HDL Ratio: 5
Triglycerides: 80 mg/dL (ref 0.0–149.0)
VLDL: 16 mg/dL (ref 0.0–40.0)

## 2021-12-24 LAB — COMPREHENSIVE METABOLIC PANEL
ALT: 15 U/L (ref 0–35)
AST: 21 U/L (ref 0–37)
Albumin: 4.3 g/dL (ref 3.5–5.2)
Alkaline Phosphatase: 79 U/L (ref 39–117)
BUN: 17 mg/dL (ref 6–23)
CO2: 27 mEq/L (ref 19–32)
Calcium: 9.9 mg/dL (ref 8.4–10.5)
Chloride: 101 mEq/L (ref 96–112)
Creatinine, Ser: 0.7 mg/dL (ref 0.40–1.20)
GFR: 101.69 mL/min (ref >60.00)
Glucose, Bld: 66 mg/dL — ABNORMAL LOW (ref 70–99)
Potassium: 3.5 mEq/L (ref 3.5–5.1)
Sodium: 138 mEq/L (ref 135–145)
Total Bilirubin: 0.7 mg/dL (ref 0.2–1.2)
Total Protein: 7.9 g/dL (ref 6.0–8.3)

## 2021-12-24 LAB — VITAMIN B12: Vitamin B-12: 266 pg/mL (ref 211–911)

## 2021-12-24 LAB — HEMOGLOBIN A1C: Hgb A1c MFr Bld: 5.4 % (ref 4.6–6.5)

## 2021-12-24 LAB — VITAMIN D 25 HYDROXY (VIT D DEFICIENCY, FRACTURES): VITD: 25.52 ng/mL — ABNORMAL LOW (ref 30.00–100.00)

## 2021-12-24 LAB — TSH: TSH: 1.11 u[IU]/mL (ref 0.35–5.50)

## 2021-12-24 MED ORDER — LOSARTAN POTASSIUM 100 MG PO TABS
100.0000 mg | ORAL_TABLET | Freq: Every day | ORAL | 1 refills | Status: DC
  Filled 2021-12-24: qty 30, 30d supply, fill #0
  Filled 2022-01-18: qty 30, 30d supply, fill #1
  Filled 2022-02-16: qty 30, 30d supply, fill #2
  Filled 2022-03-26: qty 30, 30d supply, fill #3
  Filled 2022-05-02: qty 30, 30d supply, fill #4
  Filled 2022-06-08: qty 30, 30d supply, fill #5

## 2021-12-24 MED ORDER — ALBUTEROL SULFATE HFA 108 (90 BASE) MCG/ACT IN AERS
INHALATION_SPRAY | RESPIRATORY_TRACT | 1 refills | Status: DC
  Filled 2021-12-24: qty 6.7, 25d supply, fill #0
  Filled 2022-01-18: qty 6.7, 25d supply, fill #1

## 2021-12-24 MED ORDER — ROSUVASTATIN CALCIUM 20 MG PO TABS
20.0000 mg | ORAL_TABLET | Freq: Every day | ORAL | 0 refills | Status: DC
  Filled 2021-12-24: qty 30, 30d supply, fill #0

## 2021-12-24 MED ORDER — LEVOCETIRIZINE DIHYDROCHLORIDE 5 MG PO TABS
5.0000 mg | ORAL_TABLET | Freq: Every evening | ORAL | 1 refills | Status: DC
  Filled 2021-12-24: qty 30, 30d supply, fill #0
  Filled 2022-01-18: qty 30, 30d supply, fill #1
  Filled 2022-03-03: qty 30, 30d supply, fill #2
  Filled 2022-07-24: qty 30, 30d supply, fill #3

## 2021-12-24 MED ORDER — SPIRONOLACTONE 25 MG PO TABS
25.0000 mg | ORAL_TABLET | Freq: Every day | ORAL | 1 refills | Status: DC
  Filled 2021-12-24: qty 30, 30d supply, fill #0
  Filled 2022-01-18: qty 30, 30d supply, fill #1
  Filled 2022-02-16: qty 30, 30d supply, fill #2
  Filled 2022-03-26: qty 30, 30d supply, fill #3
  Filled 2022-05-02: qty 30, 30d supply, fill #4
  Filled 2022-06-08: qty 30, 30d supply, fill #5

## 2021-12-24 NOTE — Patient Instructions (Signed)
-  Nice seeing you today!!  -Lab work today; will notify you once results are available.  -Remember your COVID vaccine.  -Schedule follow up in 6 months.   

## 2021-12-24 NOTE — Progress Notes (Signed)
Established Patient Office Visit     CC/Reason for Visit: Annual preventive exam  HPI: Danielle Shaw is a 51 y.o. female who is coming in today for the above mentioned reasons. Past Medical History is significant for: Morbid obesity, obstructive sleep apnea, hypertension, hyperlipidemia, impaired glucose tolerance.  She is doing well and has no acute concerns or complaints.  She states that she has been out of medication for 2 weeks so is not surprised that her blood pressure is elevated in office today.  At last visit it was 114/78.  She has routine eye and dental care.  Mammogram and colonoscopy are up-to-date.  She is overdue for a Pap smear and a COVID-vaccine.   Past Medical/Surgical History: Past Medical History:  Diagnosis Date   Arrhythmia    Arthritis    GERD (gastroesophageal reflux disease)    Hyperlipidemia    Hypertension    IUD 2004   IUD 2009   Migraine    Palpitations    Sleep apnea    SOB (shortness of breath)     Past Surgical History:  Procedure Laterality Date   BREAST BIOPSY Right 2011   BREAST SURGERY  2000   reduction   CESAREAN SECTION     COLONOSCOPY WITH PROPOFOL N/A 07/11/2018   Procedure: COLONOSCOPY WITH PROPOFOL;  Surgeon: Thornton Park, MD;  Location: [facility];  Service: Gastroenterology;  Laterality: N/A;   REDUCTION MAMMAPLASTY      Social History:  reports that she has never smoked. She has never used smokeless tobacco. She reports current alcohol use. She reports that she does not use drugs.  Allergies: Allergies  Allergen Reactions   Latex    Lisinopril Cough   Metoprolol Other (See Comments)    Funny feeling    Family History:  Family History  Problem Relation Age of Onset   Hyperlipidemia Mother    Hypertension Mother    Heart disease Mother 29   High Cholesterol Mother    High blood pressure Mother    Hyperlipidemia Father    Hypertension Father    Diabetes Father    Heart disease Father    High  blood pressure Father    High Cholesterol Father    Heart disease Maternal Grandmother    Hodgkin's lymphoma Daughter      Current Outpatient Medications:    albuterol (VENTOLIN HFA) 108 (90 Base) MCG/ACT inhaler, INHALE TWO PUFFS BY MOUTH EVERY 6 HOURS AS NEEDED FOR WHEEZING OR SHORTNESS OF BREATH, Disp: 18 each, Rfl: 1   cyanocobalamin (,VITAMIN B-12,) 1000 MCG/ML injection, Inject 1,000 mcg into the muscle once., Disp: , Rfl:    cyclobenzaprine (FLEXERIL) 5 MG tablet, Take 1 tablet (5 mg total) by mouth 3 (three) times daily as needed for muscle spasms., Disp: 30 tablet, Rfl: 1   gabapentin (NEURONTIN) 100 MG capsule, Take 100 mg by mouth 3 (three) times daily., Disp: , Rfl:    HYDROcodone-acetaminophen (NORCO/VICODIN) 5-325 MG tablet, Take 1 tablet by mouth 2 (two) times daily as needed., Disp: , Rfl:    levocetirizine (XYZAL) 5 MG tablet, Take 1 tablet (5 mg total) by mouth every evening., Disp: 90 tablet, Rfl: 1   losartan (COZAAR) 100 MG tablet, Take 1 tablet (100 mg total) by mouth daily., Disp: 90 tablet, Rfl: 1   modafinil (PROVIGIL) 200 MG tablet, Take 1 tablet (200 mg total) by mouth daily., Disp: 30 tablet, Rfl: 5   omeprazole (PRILOSEC) 20 MG capsule, Take 20 mg  by mouth daily., Disp: , Rfl:    ondansetron (ZOFRAN) 4 MG tablet, Take 1 tablet (4 mg total) by mouth every 8 (eight) hours as needed for nausea or vomiting., Disp: 50 tablet, Rfl: 0   pantoprazole (PROTONIX) 40 MG tablet, Take 1 tablet (40 mg total) by mouth daily., Disp: 90 tablet, Rfl: 3   rosuvastatin (CRESTOR) 20 MG tablet, Take 1 tablet (20 mg total) by mouth daily., Disp: 30 tablet, Rfl: 0   spironolactone (ALDACTONE) 25 MG tablet, Take 1 tablet (25 mg total) by mouth daily. Take 25 mg by mouth daily., Disp: 90 tablet, Rfl: 1   tirzepatide (MOUNJARO) 12.5 MG/0.5ML Pen, Inject 12.5 mg into the skin once a week., Disp: 18 mL, Rfl: 0   Vitamin D, Ergocalciferol, (DRISDOL) 1.25 MG (50000 UNIT) CAPS capsule, Take 1  capsule (50,000 Units total) by mouth every 7 (seven) days., Disp: 4 capsule, Rfl: 0  Review of Systems:  Constitutional: Denies fever, chills, diaphoresis, appetite change and fatigue.  HEENT: Denies photophobia, eye pain, redness, hearing loss, ear pain, congestion, sore throat, rhinorrhea, sneezing, mouth sores, trouble swallowing, neck pain, neck stiffness and tinnitus.   Respiratory: Denies SOB, DOE, cough, chest tightness,  and wheezing.   Cardiovascular: Denies chest pain, palpitations and leg swelling.  Gastrointestinal: Denies nausea, vomiting, abdominal pain, diarrhea, constipation, blood in stool and abdominal distention.  Genitourinary: Denies dysuria, urgency, frequency, hematuria, flank pain and difficulty urinating.  Endocrine: Denies: hot or cold intolerance, sweats, changes in hair or nails, polyuria, polydipsia. Musculoskeletal: Denies myalgias, back pain, joint swelling, arthralgias and gait problem.  Skin: Denies pallor, rash and wound.  Neurological: Denies dizziness, seizures, syncope, weakness, light-headedness, numbness and headaches.  Hematological: Denies adenopathy. Easy bruising, personal or family bleeding history  Psychiatric/Behavioral: Denies suicidal ideation, mood changes, confusion, nervousness, sleep disturbance and agitation    Physical Exam: Vitals:   12/24/21 1004  BP: (!) 132/92  Pulse: 69  Temp: 97.9 F (36.6 C)  TempSrc: Oral  SpO2: 97%  Weight: 264 lb 14.4 oz (120.2 kg)  Height: 5' 5.5" (1.664 m)    Body mass index is 43.41 kg/m.   Constitutional: NAD, calm, comfortable, obese Eyes: PERRL, lids and conjunctivae normal ENMT: Mucous membranes are moist.  Respiratory: clear to auscultation bilaterally, no wheezing, no crackles. Normal respiratory effort. No accessory muscle use.  Cardiovascular: Regular rate and rhythm, no murmurs / rubs / gallops. No extremity edema. 2+ pedal pulses. No carotid bruits.  Abdomen: no tenderness, no masses  palpated. No hepatosplenomegaly. Bowel sounds positive.  Musculoskeletal: no clubbing / cyanosis. No joint deformity upper and lower extremities. Good ROM, no contractures. Normal muscle tone.  Skin: no rashes, lesions, ulcers. No induration Neurologic: CN 2-12 grossly intact. Sensation intact, DTR normal. Strength 5/5 in all 4.  Psychiatric: Normal judgment and insight. Alert and oriented x 3. Normal mood.    Impression and Plan:  Encounter for preventive health examination -Recommend routine eye and dental care. -Immunizations: Due for COVID-vaccine -Healthy lifestyle discussed in detail. -Labs to be updated today. -Colon cancer screening: 06/2018 -Breast cancer screening: 07/2021 -Cervical cancer screening: Done in office today -Lung cancer screening: Not applicable -Prostate cancer screening: Not applicable -DEXA: Not applicable  Essential hypertension  - Plan: rosuvastatin (CRESTOR) 20 MG tablet, spironolactone (ALDACTONE) 25 MG tablet -Blood pressure is elevated today, likely on account of being out of refills for 2 weeks.  Refill losartan.  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  Hyperlipidemia,  unspecified hyperlipidemia type  - Plan: Lipid panel -On rosuvastatin 20 mg daily.  Morbid obesity ([place])  - Plan: TSH, Vitamin B12, VITAMIN D 25 Hydroxy (Vit-D Deficiency, Fractures) -Discussed healthy lifestyle, including increased physical activity and better food choices to promote weight loss.  IGT (impaired glucose tolerance)  - Plan: Hemoglobin A1c    Patient Instructions  -Nice seeing you today!!  -Lab work today; will notify you once results are available.  -Remember your COVID vaccine.  -Schedule follow up in 6 months.      Lelon Frohlich, MD [facility]

## 2021-12-25 ENCOUNTER — Encounter (INDEPENDENT_AMBULATORY_CARE_PROVIDER_SITE_OTHER): Payer: Self-pay | Admitting: Family Medicine

## 2021-12-25 ENCOUNTER — Other Ambulatory Visit (HOSPITAL_BASED_OUTPATIENT_CLINIC_OR_DEPARTMENT_OTHER): Payer: Self-pay

## 2021-12-25 ENCOUNTER — Telehealth (INDEPENDENT_AMBULATORY_CARE_PROVIDER_SITE_OTHER): Payer: 59 | Admitting: Family Medicine

## 2021-12-25 DIAGNOSIS — Z6841 Body Mass Index (BMI) 40.0 and over, adult: Secondary | ICD-10-CM | POA: Diagnosis not present

## 2021-12-25 DIAGNOSIS — E7849 Other hyperlipidemia: Secondary | ICD-10-CM | POA: Diagnosis not present

## 2021-12-25 DIAGNOSIS — E669 Obesity, unspecified: Secondary | ICD-10-CM | POA: Diagnosis not present

## 2021-12-25 DIAGNOSIS — E559 Vitamin D deficiency, unspecified: Secondary | ICD-10-CM | POA: Diagnosis not present

## 2021-12-25 MED ORDER — CHOLECALCIFEROL 1.25 MG (50000 UT) PO CAPS
ORAL_CAPSULE | ORAL | 0 refills | Status: DC
  Filled 2021-12-25: qty 4, 28d supply, fill #0

## 2021-12-26 LAB — CYTOLOGY - PAP
Adequacy: ABSENT
Comment: NEGATIVE
Diagnosis: UNDETERMINED — AB
High risk HPV: NEGATIVE

## 2021-12-30 ENCOUNTER — Encounter: Payer: Self-pay | Admitting: Internal Medicine

## 2021-12-30 ENCOUNTER — Other Ambulatory Visit: Payer: Self-pay | Admitting: Internal Medicine

## 2021-12-30 DIAGNOSIS — E559 Vitamin D deficiency, unspecified: Secondary | ICD-10-CM | POA: Insufficient documentation

## 2021-12-30 MED ORDER — VITAMIN D (ERGOCALCIFEROL) 1.25 MG (50000 UNIT) PO CAPS: 50000.0000 [IU] | ORAL_CAPSULE | ORAL | 0 refills | Status: DC

## 2022-01-01 NOTE — Progress Notes (Addendum)
TeleHealth Visit:  Due to the COVID-19 pandemic, this visit was completed with telemedicine (audio/video) technology to reduce patient and provider exposure as well as to preserve personal protective equipment.   Danielle Shaw has verbally consented to this TeleHealth visit. The patient is located at home, the provider is located at the [facility] office. The participants in this visit include the listed provider and patient. The visit was conducted today via Mychart Video.    Chief Complaint: OBESITY Danielle Shaw is here to discuss her progress with her obesity treatment plan along with follow-up of her obesity related diagnoses. Danielle Shaw is on the Category 3 Plan and states she is following her eating plan approximately 90% of the time. Danielle Shaw states she is going to gym/treadmill 60 minutes 3 times per week.  Today's visit was #: 13 Starting weight: 312 lbs Starting date: 04/08/2021  Interim History: Danielle Shaw has been sent out of town for the next day. She reports 5 lbs down from prior appointment. She saw PCP yesterday and had labs done. She felt disappointed with some lab results.  Subjective:   1. Vitamin D deficiency Danielle Shaw has been consistent on D2 for last few months. Denies any nausea, vomiting or muscle weakness. She notes fatigue. No appreciable increase in Vit D.   2. Other hyperlipidemia Interestingly LDL increased from 90 to 178. She is currently on statin (rosuvastatin). Without myalgias or transaminitis.  Assessment/Plan:   1. Vitamin D deficiency Danielle Shaw will start Vit D2 50K IU weekly.  2. Other hyperlipidemia Danielle Shaw will continue statin. We will repeat labs in 3 months.  3. Obesity with current BMI of 43.9 Danielle Shaw is currently in the action stage of change. As such, her goal is to continue with weight loss efforts. She has agreed to the Category 3 Plan.   Exercise goals: All adults should avoid inactivity. Some physical activity is better than none, and adults who  participate in any amount of physical activity gain some health benefits.  Behavioral modification strategies: increasing lean protein intake, meal planning and cooking strategies, keeping healthy foods in the home, and travel eating strategies.  Danielle Shaw has agreed to follow-up with our clinic in 3 weeks. She was informed of the importance of frequent follow-up visits to maximize her success with intensive lifestyle modifications for her multiple health conditions.  Objective:   VITALS: Per patient if applicable, see vitals. GENERAL: Alert and in no acute distress. CARDIOPULMONARY: No increased WOB. Speaking in clear sentences.  PSYCH: Pleasant and cooperative. Speech normal rate and rhythm. Affect is appropriate. Insight and judgement are appropriate. Attention is focused, linear, and appropriate.  NEURO: Oriented as arrived to appointment on time with no prompting.   Lab Results  Component Value Date   CREATININE 0.70 12/24/2021   BUN 17 12/24/2021   NA 138 12/24/2021   K 3.5 12/24/2021   CL 101 12/24/2021   CO2 27 12/24/2021   Lab Results  Component Value Date   ALT 15 12/24/2021   AST 21 12/24/2021   ALKPHOS 79 12/24/2021   BILITOT 0.7 12/24/2021   Lab Results  Component Value Date   HGBA1C 5.4 12/24/2021   HGBA1C 5.6 09/15/2021   HGBA1C 6.3 (H) 04/08/2021   HGBA1C 6.0 (H) 09/27/2018   HGBA1C 6.0 (H) 03/17/2018   Lab Results  Component Value Date   INSULIN 14.4 09/15/2021   INSULIN 20.3 04/08/2021   INSULIN 22.0 09/27/2018   INSULIN 16.3 03/17/2018   INSULIN 20.9 12/08/2017   Lab Results  Component Value Date   TSH 1.11 12/24/2021   Lab Results  Component Value Date   CHOL 241 (H) 12/24/2021   HDL 47.30 12/24/2021   LDLCALC 178 (H) 12/24/2021   LDLDIRECT 177 (H) 10/28/2010   TRIG 80.0 12/24/2021   CHOLHDL 5 12/24/2021   Lab Results  Component Value Date   VD25OH 25.52 (L) 12/24/2021   VD25OH 39.4 09/15/2021   VD25OH 25.2 (L) 04/08/2021   Lab Results   Component Value Date   WBC 3.9 (L) 12/24/2021   HGB 12.6 12/24/2021   HCT 36.9 12/24/2021   MCV 86.1 12/24/2021   PLT 303.0 12/24/2021   No results found for: "IRON", "TIBC", "FERRITIN"  Attestation Statements:   Reviewed by clinician on day of visit: allergies, medications, problem list, medical history, surgical history, family history, social history, and previous encounter notes.  I, Elnora Morrison, RMA am acting as transcriptionist for Coralie Common, MD. I have reviewed the above documentation for accuracy and completeness, and I agree with the above. - Coralie Common, MD

## 2022-01-06 ENCOUNTER — Other Ambulatory Visit (HOSPITAL_BASED_OUTPATIENT_CLINIC_OR_DEPARTMENT_OTHER): Payer: Self-pay

## 2022-01-15 ENCOUNTER — Other Ambulatory Visit (HOSPITAL_BASED_OUTPATIENT_CLINIC_OR_DEPARTMENT_OTHER): Payer: Self-pay

## 2022-01-18 ENCOUNTER — Other Ambulatory Visit: Payer: Self-pay | Admitting: Internal Medicine

## 2022-01-18 DIAGNOSIS — I10 Essential (primary) hypertension: Secondary | ICD-10-CM

## 2022-01-19 ENCOUNTER — Other Ambulatory Visit (HOSPITAL_BASED_OUTPATIENT_CLINIC_OR_DEPARTMENT_OTHER): Payer: Self-pay

## 2022-01-19 MED ORDER — ROSUVASTATIN CALCIUM 20 MG PO TABS
20.0000 mg | ORAL_TABLET | Freq: Every day | ORAL | 1 refills | Status: DC
  Filled 2022-01-19: qty 30, 30d supply, fill #0
  Filled 2022-02-16: qty 30, 30d supply, fill #1
  Filled 2022-03-26: qty 30, 30d supply, fill #2
  Filled 2022-05-02: qty 30, 30d supply, fill #3
  Filled 2022-06-08: qty 30, 30d supply, fill #4

## 2022-01-20 ENCOUNTER — Encounter (INDEPENDENT_AMBULATORY_CARE_PROVIDER_SITE_OTHER): Payer: Self-pay | Admitting: Family Medicine

## 2022-01-20 ENCOUNTER — Encounter (INDEPENDENT_AMBULATORY_CARE_PROVIDER_SITE_OTHER): Payer: Self-pay

## 2022-01-20 ENCOUNTER — Other Ambulatory Visit (HOSPITAL_BASED_OUTPATIENT_CLINIC_OR_DEPARTMENT_OTHER): Payer: Self-pay

## 2022-01-20 ENCOUNTER — Ambulatory Visit (INDEPENDENT_AMBULATORY_CARE_PROVIDER_SITE_OTHER): Payer: 59 | Admitting: Family Medicine

## 2022-01-20 ENCOUNTER — Telehealth (INDEPENDENT_AMBULATORY_CARE_PROVIDER_SITE_OTHER): Payer: Self-pay

## 2022-01-20 VITALS — BP 128/84 | HR 73 | Temp 98.0°F | Ht 65.0 in | Wt 257.0 lb

## 2022-01-20 DIAGNOSIS — E669 Obesity, unspecified: Secondary | ICD-10-CM | POA: Diagnosis not present

## 2022-01-20 DIAGNOSIS — R7303 Prediabetes: Secondary | ICD-10-CM

## 2022-01-20 DIAGNOSIS — Z6841 Body Mass Index (BMI) 40.0 and over, adult: Secondary | ICD-10-CM

## 2022-01-20 DIAGNOSIS — I10 Essential (primary) hypertension: Secondary | ICD-10-CM

## 2022-01-20 DIAGNOSIS — Z9189 Other specified personal risk factors, not elsewhere classified: Secondary | ICD-10-CM

## 2022-01-20 MED ORDER — TIRZEPATIDE 12.5 MG/0.5ML ~~LOC~~ SOAJ
12.5000 mg | SUBCUTANEOUS | 0 refills | Status: DC
  Filled 2022-01-21: qty 18, 252d supply, fill #0

## 2022-01-20 MED ORDER — WEGOVY 2.4 MG/0.75ML ~~LOC~~ SOAJ
2.4000 mg | SUBCUTANEOUS | 0 refills | Status: DC
  Filled 2022-01-20 – 2022-02-16 (×2): qty 3, 28d supply, fill #0

## 2022-01-21 ENCOUNTER — Telehealth (INDEPENDENT_AMBULATORY_CARE_PROVIDER_SITE_OTHER): Payer: Self-pay | Admitting: Family Medicine

## 2022-01-21 ENCOUNTER — Other Ambulatory Visit (HOSPITAL_BASED_OUTPATIENT_CLINIC_OR_DEPARTMENT_OTHER): Payer: Self-pay

## 2022-01-21 ENCOUNTER — Encounter (INDEPENDENT_AMBULATORY_CARE_PROVIDER_SITE_OTHER): Payer: Self-pay

## 2022-01-21 NOTE — Telephone Encounter (Signed)
Prior authorization has been started for [facility]. Will notify patient and provider once response is received.

## 2022-01-21 NOTE — Progress Notes (Signed)
Chief Complaint:   OBESITY Danielle Shaw is here to discuss her progress with her obesity treatment plan along with follow-up of her obesity related diagnoses. Danielle Shaw is on the Category 3 Plan and states she is following her eating plan approximately 85% of the time. Danielle Shaw states she is walking treadmill at gym 60 minutes 3 times per week.  Today's visit was #: 11 Starting weight: 312 lbs Starting date: 04/08/2021 Today's weight: 257 lbs Today's date: 01/20/2022 Total lbs lost to date: 55 lbs Total lbs lost since last in-office visit: 13  Interim History: Danielle Shaw is planning a trip to [place] in 2 weeks. She leaves on July 15th. She has done a lot of traveling and training over last 2 months. Hoping to travel less. She has had to focus more consistently on getting all food in. Her biggest obstacle in next few weeks will be staying on track when home.  Subjective:   1. Prediabetes Danielle Shaw is currently on Mounjaro 12.5 mg. Not sure if she will be able to get 12.5 mg.  2. Essential hypertension Danielle Shaw's blood pressure is well controlled today. She's taking Cozaar and Aldactone. Denies chest pain, chest pressure and headache.  3. At risk for side effect of medication Danielle Shaw is at risk for drug side effects due to switching to [facility].  Assessment/Plan:   1. Prediabetes We will refill Mounjaro 12.5 mg SubQ once weekly for 1 month with 0 refills.  -Refill tirzepatide (MOUNJARO) 12.5 MG/0.5ML Pen; Inject 12.5 mg into the skin once a week.  Dispense: 18 mL; Refill: 0  2. Essential hypertension Danielle Shaw will continue taking current medications.  3. At risk for side effect of medication Danielle Shaw was given approximately 15 minutes of drug side effect counseling today.  We discussed side effect possibility and risk versus benefits. Danielle Shaw agreed to the medication and will contact this office if these side effects are intolerable.  Repetitive spaced learning was employed today to elicit superior memory  formation and behavioral change.  4. Obesity with current BMI of 42.8 Start Wegovy 2.4 mg SubQ one weely for 1 month with 0 refills.  -Start Semaglutide-Weight Management (WEGOVY) 2.4 MG/0.75ML SOAJ; Inject 2.4 mg into the skin once a week.  Dispense: 3 mL; Refill: 0  Danielle Shaw is currently in the action stage of change. As such, her goal is to continue with weight loss efforts. She has agreed to the Category 3 Plan.   Exercise goals: As is. Danielle Shaw also will increase focus on Weight training.  Behavioral modification strategies: increasing lean protein intake, decreasing sodium intake, and meal planning and cooking strategies.  Danielle Shaw has agreed to follow-up with our clinic in 4 weeks. She was informed of the importance of frequent follow-up visits to maximize her success with intensive lifestyle modifications for her multiple health conditions.   Objective:   Blood pressure 128/84, pulse 73, temperature 98 F (36.7 C), height '5\' 5"'$  (1.651 m), weight 257 lb (116.6 kg), SpO2 100 %. Body mass index is 42.77 kg/m.  General: Cooperative, alert, well developed, in no acute distress. HEENT: Conjunctivae and lids unremarkable. Cardiovascular: Regular rhythm.  Lungs: Normal work of breathing. Neurologic: No focal deficits.   Lab Results  Component Value Date   CREATININE 0.70 12/24/2021   BUN 17 12/24/2021   NA 138 12/24/2021   K 3.5 12/24/2021   CL 101 12/24/2021   CO2 27 12/24/2021   Lab Results  Component Value Date   ALT 15 12/24/2021   AST 21 12/24/2021  ALKPHOS 79 12/24/2021   BILITOT 0.7 12/24/2021   Lab Results  Component Value Date   HGBA1C 5.4 12/24/2021   HGBA1C 5.6 09/15/2021   HGBA1C 6.3 (H) 04/08/2021   HGBA1C 6.0 (H) 09/27/2018   HGBA1C 6.0 (H) 03/17/2018   Lab Results  Component Value Date   INSULIN 14.4 09/15/2021   INSULIN 20.3 04/08/2021   INSULIN 22.0 09/27/2018   INSULIN 16.3 03/17/2018   INSULIN 20.9 12/08/2017   Lab Results  Component Value Date    TSH 1.11 12/24/2021   Lab Results  Component Value Date   CHOL 241 (H) 12/24/2021   HDL 47.30 12/24/2021   LDLCALC 178 (H) 12/24/2021   LDLDIRECT 177 (H) 10/28/2010   TRIG 80.0 12/24/2021   CHOLHDL 5 12/24/2021   Lab Results  Component Value Date   VD25OH 25.52 (L) 12/24/2021   VD25OH 39.4 09/15/2021   VD25OH 25.2 (L) 04/08/2021   Lab Results  Component Value Date   WBC 3.9 (L) 12/24/2021   HGB 12.6 12/24/2021   HCT 36.9 12/24/2021   MCV 86.1 12/24/2021   PLT 303.0 12/24/2021   No results found for: "IRON", "TIBC", "FERRITIN"  Attestation Statements:   Reviewed by clinician on day of visit: allergies, medications, problem list, medical history, surgical history, family history, social history, and previous encounter notes.  I, Elnora Morrison, RMA am acting as transcriptionist for Coralie Common, MD. I have reviewed the above documentation for accuracy and completeness, and I agree with the above. - Coralie Common, MD

## 2022-01-21 NOTE — Telephone Encounter (Signed)
Dr. Jearld Shines - Prior authorization denied for [facility]. Per insurance: The requested medication and/or diagnosis are not a covered benefit and excluded from coverage in accordance with the terms and conditions of your plan benefit. Patient sent denial message via mychart.

## 2022-02-05 ENCOUNTER — Ambulatory Visit (INDEPENDENT_AMBULATORY_CARE_PROVIDER_SITE_OTHER): Payer: 59 | Admitting: Family Medicine

## 2022-02-16 ENCOUNTER — Ambulatory Visit (INDEPENDENT_AMBULATORY_CARE_PROVIDER_SITE_OTHER): Payer: 59 | Admitting: Family Medicine

## 2022-02-16 ENCOUNTER — Other Ambulatory Visit (HOSPITAL_BASED_OUTPATIENT_CLINIC_OR_DEPARTMENT_OTHER): Payer: Self-pay

## 2022-02-16 ENCOUNTER — Encounter (HOSPITAL_BASED_OUTPATIENT_CLINIC_OR_DEPARTMENT_OTHER): Payer: Self-pay

## 2022-02-16 ENCOUNTER — Encounter (INDEPENDENT_AMBULATORY_CARE_PROVIDER_SITE_OTHER): Payer: Self-pay | Admitting: Family Medicine

## 2022-02-16 VITALS — BP 120/82 | HR 69 | Temp 97.7°F | Ht 65.0 in | Wt 254.0 lb

## 2022-02-16 DIAGNOSIS — E559 Vitamin D deficiency, unspecified: Secondary | ICD-10-CM | POA: Diagnosis not present

## 2022-02-16 DIAGNOSIS — R7303 Prediabetes: Secondary | ICD-10-CM

## 2022-02-16 DIAGNOSIS — Z6841 Body Mass Index (BMI) 40.0 and over, adult: Secondary | ICD-10-CM

## 2022-02-16 DIAGNOSIS — E669 Obesity, unspecified: Secondary | ICD-10-CM

## 2022-02-16 MED ORDER — VITAMIN D (ERGOCALCIFEROL) 1.25 MG (50000 UNIT) PO CAPS
50000.0000 [IU] | ORAL_CAPSULE | ORAL | 0 refills | Status: DC
  Filled 2022-02-16 – 2022-03-03 (×2): qty 4, 28d supply, fill #0

## 2022-02-16 MED ORDER — TIRZEPATIDE 12.5 MG/0.5ML ~~LOC~~ SOAJ: 12.5000 mg | SUBCUTANEOUS | 0 refills | Status: DC

## 2022-02-16 MED ORDER — TIRZEPATIDE 12.5 MG/0.5ML ~~LOC~~ SOAJ
12.5000 mg | SUBCUTANEOUS | 0 refills | Status: DC
  Filled 2022-02-16 – 2022-03-03 (×3): qty 2, 28d supply, fill #0

## 2022-02-18 NOTE — Progress Notes (Signed)
Chief Complaint:   OBESITY Danielle Shaw is here to discuss her progress with her obesity treatment plan along with follow-up of her obesity related diagnoses. Danielle Shaw is on the Category 3 Plan and states she is following her eating plan approximately 80% of the time. Danielle Shaw states she is doing squats/walking 30-60 minutes 3-4 times per week.  Today's visit was #: 12 Starting weight: 312 lbs Starting date: 04/08/2021 Today's weight: 254 lbs Today's date: 02/16/2022 Total lbs lost to date: 67 lbs Total lbs lost since last in-office visit: 3  Interim History: Danielle Shaw has had some vacation over the last few weeks. She returned 4 days ago. She is traveling to [place] for a week or 2 then [employer]. Danielle Shaw has been denied. She plans to get groceries prior to leaving to stick on Cat 3.  Subjective:   1. Prediabetes Danielle Shaw's last A1c was slightly better at 5.4. Previously on Mounjaro.   2. Vitamin D deficiency Danielle Shaw is currently taking prescription Vit D 50,000 IU once a week. Her last Vit D level of 25.52.  Assessment/Plan:   1. Prediabetes We will refill Mounjaro 12.5 mg SubQ once weekly for 1 month with 0 refills.   -Refill tirzepatide (MOUNJARO) 12.5 MG/0.5ML Pen; Inject 12.5 mg into the skin once a week.  Dispense: 2 mL; Refill: 0  2. Vitamin D deficiency We will refill Vit D 50,000 IU once a week for 1 month with 0 refills.  -Refill Vitamin D, Ergocalciferol, (DRISDOL) 1.25 MG (50000 UNIT) CAPS capsule; Take 1 capsule (50,000 Units total) by mouth every 7 (seven) days for 12 doses.  Dispense: 12 capsule; Refill: 0  3. Obesity with current BMI of 42.4 Danielle Shaw is currently in the action stage of change. As such, her goal is to continue with weight loss efforts. She has agreed to the Category 3 Plan.   Exercise goals: All adults should avoid inactivity. Some physical activity is better than none, and adults who participate in any amount of physical activity gain some health  benefits.  Behavioral modification strategies: increasing lean protein intake, meal planning and cooking strategies, keeping healthy foods in the home, and travel eating strategies.  Danielle Shaw has agreed to follow-up with our clinic in 4 weeks. She was informed of the importance of frequent follow-up visits to maximize her success with intensive lifestyle modifications for her multiple health conditions.   Objective:   Blood pressure 120/82, pulse 69, temperature 97.7 F (36.5 C), height '5\' 5"'$  (1.651 m), weight 254 lb (115.2 kg), SpO2 100 %. Body mass index is 42.27 kg/m.  General: Cooperative, alert, well developed, in no acute distress. HEENT: Conjunctivae and lids unremarkable. Cardiovascular: Regular rhythm.  Lungs: Normal work of breathing. Neurologic: No focal deficits.   Lab Results  Component Value Date   CREATININE 0.70 12/24/2021   BUN 17 12/24/2021   NA 138 12/24/2021   K 3.5 12/24/2021   CL 101 12/24/2021   CO2 27 12/24/2021   Lab Results  Component Value Date   ALT 15 12/24/2021   AST 21 12/24/2021   ALKPHOS 79 12/24/2021   BILITOT 0.7 12/24/2021   Lab Results  Component Value Date   HGBA1C 5.4 12/24/2021   HGBA1C 5.6 09/15/2021   HGBA1C 6.3 (H) 04/08/2021   HGBA1C 6.0 (H) 09/27/2018   HGBA1C 6.0 (H) 03/17/2018   Lab Results  Component Value Date   INSULIN 14.4 09/15/2021   INSULIN 20.3 04/08/2021   INSULIN 22.0 09/27/2018   INSULIN 16.3 03/17/2018  INSULIN 20.9 12/08/2017   Lab Results  Component Value Date   TSH 1.11 12/24/2021   Lab Results  Component Value Date   CHOL 241 (H) 12/24/2021   HDL 47.30 12/24/2021   LDLCALC 178 (H) 12/24/2021   LDLDIRECT 177 (H) 10/28/2010   TRIG 80.0 12/24/2021   CHOLHDL 5 12/24/2021   Lab Results  Component Value Date   VD25OH 25.52 (L) 12/24/2021   VD25OH 39.4 09/15/2021   VD25OH 25.2 (L) 04/08/2021   Lab Results  Component Value Date   WBC 3.9 (L) 12/24/2021   HGB 12.6 12/24/2021   HCT 36.9  12/24/2021   MCV 86.1 12/24/2021   PLT 303.0 12/24/2021   No results found for: "IRON", "TIBC", "FERRITIN"  Attestation Statements:   Reviewed by clinician on day of visit: allergies, medications, problem list, medical history, surgical history, family history, social history, and previous encounter notes.  I, Elnora Morrison, RMA am acting as transcriptionist for Coralie Common, MD.  I have reviewed the above documentation for accuracy and completeness, and I agree with the above. - Coralie Common, MD

## 2022-02-19 ENCOUNTER — Other Ambulatory Visit (HOSPITAL_BASED_OUTPATIENT_CLINIC_OR_DEPARTMENT_OTHER): Payer: Self-pay

## 2022-02-19 ENCOUNTER — Encounter (INDEPENDENT_AMBULATORY_CARE_PROVIDER_SITE_OTHER): Payer: Self-pay | Admitting: Family Medicine

## 2022-03-02 ENCOUNTER — Other Ambulatory Visit (HOSPITAL_BASED_OUTPATIENT_CLINIC_OR_DEPARTMENT_OTHER): Payer: Self-pay

## 2022-03-03 ENCOUNTER — Other Ambulatory Visit (HOSPITAL_BASED_OUTPATIENT_CLINIC_OR_DEPARTMENT_OTHER): Payer: Self-pay

## 2022-03-03 ENCOUNTER — Other Ambulatory Visit: Payer: Self-pay | Admitting: Internal Medicine

## 2022-03-03 DIAGNOSIS — F1190 Opioid use, unspecified, uncomplicated: Secondary | ICD-10-CM

## 2022-03-03 MED ORDER — ALBUTEROL SULFATE HFA 108 (90 BASE) MCG/ACT IN AERS
INHALATION_SPRAY | RESPIRATORY_TRACT | 1 refills | Status: DC
  Filled 2022-03-03: qty 6.7, 25d supply, fill #0
  Filled 2022-06-08 – 2022-07-24 (×2): qty 6.7, 25d supply, fill #1

## 2022-03-04 ENCOUNTER — Encounter (INDEPENDENT_AMBULATORY_CARE_PROVIDER_SITE_OTHER): Payer: Self-pay

## 2022-03-04 ENCOUNTER — Other Ambulatory Visit (HOSPITAL_BASED_OUTPATIENT_CLINIC_OR_DEPARTMENT_OTHER): Payer: Self-pay

## 2022-03-04 ENCOUNTER — Encounter (HOSPITAL_BASED_OUTPATIENT_CLINIC_OR_DEPARTMENT_OTHER): Payer: Self-pay

## 2022-03-17 ENCOUNTER — Encounter (INDEPENDENT_AMBULATORY_CARE_PROVIDER_SITE_OTHER): Payer: Self-pay | Admitting: Family Medicine

## 2022-03-17 ENCOUNTER — Ambulatory Visit (INDEPENDENT_AMBULATORY_CARE_PROVIDER_SITE_OTHER): Payer: 59 | Admitting: Family Medicine

## 2022-03-17 ENCOUNTER — Other Ambulatory Visit (HOSPITAL_BASED_OUTPATIENT_CLINIC_OR_DEPARTMENT_OTHER): Payer: Self-pay

## 2022-03-17 VITALS — BP 122/82 | HR 71 | Temp 97.7°F | Ht 65.0 in | Wt 259.0 lb

## 2022-03-17 DIAGNOSIS — E559 Vitamin D deficiency, unspecified: Secondary | ICD-10-CM | POA: Diagnosis not present

## 2022-03-17 DIAGNOSIS — Z6841 Body Mass Index (BMI) 40.0 and over, adult: Secondary | ICD-10-CM

## 2022-03-17 DIAGNOSIS — E669 Obesity, unspecified: Secondary | ICD-10-CM | POA: Diagnosis not present

## 2022-03-17 DIAGNOSIS — R632 Polyphagia: Secondary | ICD-10-CM

## 2022-03-17 MED ORDER — TOPIRAMATE 25 MG PO TABS
50.0000 mg | ORAL_TABLET | Freq: Every morning | ORAL | 0 refills | Status: DC
  Filled 2022-03-17: qty 45, 22d supply, fill #0

## 2022-03-17 MED ORDER — LOMAIRA 8 MG PO TABS
8.0000 mg | ORAL_TABLET | Freq: Every morning | ORAL | 0 refills | Status: DC
  Filled 2022-03-17: qty 30, 30d supply, fill #0

## 2022-03-17 MED ORDER — CHOLECALCIFEROL 1.25 MG (50000 UT) PO CAPS
50000.0000 [IU] | ORAL_CAPSULE | ORAL | 0 refills | Status: DC
  Filled 2022-03-17: qty 4, 28d supply, fill #0

## 2022-03-18 ENCOUNTER — Other Ambulatory Visit (HOSPITAL_BASED_OUTPATIENT_CLINIC_OR_DEPARTMENT_OTHER): Payer: Self-pay

## 2022-03-26 ENCOUNTER — Other Ambulatory Visit (HOSPITAL_BASED_OUTPATIENT_CLINIC_OR_DEPARTMENT_OTHER): Payer: Self-pay

## 2022-03-28 NOTE — Progress Notes (Signed)
Chief Complaint:   OBESITY Danielle Shaw is here to discuss her progress with her obesity treatment plan along with follow-up of her obesity related diagnoses. Danielle Shaw is on the Category 3 Plan and states she is following her eating plan approximately 85% of the time. Danielle Shaw states she has worked out at [employer] 4 times and is lifting weight for 14 hours at work 6 days per week.  Today's visit was #: 26 Starting weight: 312 lbs Starting date: 04/08/2021 Today's weight: 259 lbs Today's date: 03/17/2022 Total lbs lost to date: 53 Total lbs lost since last in-office visit: 0  Interim History: Danielle Shaw got a new job recently. She will be moving to [place] and not having to travel as much. Danielle Shaw is having less frequent bowel movements. She feels herself retaining more fluid. Danielle Shaw is feeling the need for some assistance in controlling intake and Mounjaro is no longer covered.  Subjective:   1. Polyphagia Danielle Shaw is noticing some increased drive for carbs and is still traveling quite a bit for work. PDMP was checked. Controlled substance contract was signed. Her blood pressure is within normal limits.  2. Vitamin D deficiency Danielle Shaw is on prescription vitamin D. Her vitamin D level was 25.52 at the end of May. She denies nausea, vomiting, or muscle weakness.  Assessment/Plan:   1. Polyphagia Danielle Shaw agrees to start Lomaira '4mg'$  for 2 weeks, then to increase to '8mg'$  daily. She agrees to start topiramate '25mg'$  daily for 2 weeks, then to increase to '50mg'$  daily. We discussed side effects risks and possible benefits, as well as goal weight loss of 13 pounds in 4 months. Danielle Shaw agrees to follow up as directed.  - Phentermine HCl (LOMAIRA) 8 MG TABS; Take 8 mg by mouth in the morning.  Dispense: 30 tablet; Refill: 0 - topiramate (TOPAMAX) 25 MG tablet; Take 2 tablets (50 mg total) by mouth every morning.  Dispense: 45 tablet; Refill: 0  2. Vitamin D deficiency Danielle Shaw agrees to continue prescription vitamin D, but  will change to Cholecalciferol 50,000 IU per week.  - Cholecalciferol 1.25 MG (50000 UT) capsule; Take 1 capsule (50,000 Units total) by mouth once a week.  Dispense: 4 capsule; Refill: 0  3. Obesity with current BMI of 43.1 Danielle Shaw is currently in the action stage of change. As such, her goal is to maintain weight for now. She has agreed to the Category 3 Plan.   Exercise goals:  As is.  Behavioral modification strategies: increasing lean protein intake, decreasing eating out, meal planning and cooking strategies, and travel eating strategies.  Danielle Shaw has agreed to follow-up with our clinic in 3 to 4 weeks. She was informed of the importance of frequent follow-up visits to maximize her success with intensive lifestyle modifications for her multiple health conditions.   Objective:   Blood pressure 122/82, pulse 71, temperature 97.7 F (36.5 C), height '5\' 5"'$  (1.651 m), weight 259 lb (117.5 kg), SpO2 98 %. Body mass index is 43.1 kg/m.  General: Cooperative, alert, well developed, in no acute distress. HEENT: Conjunctivae and lids unremarkable. Cardiovascular: Regular rhythm.  Lungs: Normal work of breathing. Neurologic: No focal deficits.   Lab Results  Component Value Date   CREATININE 0.70 12/24/2021   BUN 17 12/24/2021   NA 138 12/24/2021   K 3.5 12/24/2021   CL 101 12/24/2021   CO2 27 12/24/2021   Lab Results  Component Value Date   ALT 15 12/24/2021   AST 21 12/24/2021   ALKPHOS  79 12/24/2021   BILITOT 0.7 12/24/2021   Lab Results  Component Value Date   HGBA1C 5.4 12/24/2021   HGBA1C 5.6 09/15/2021   HGBA1C 6.3 (H) 04/08/2021   HGBA1C 6.0 (H) 09/27/2018   HGBA1C 6.0 (H) 03/17/2018   Lab Results  Component Value Date   INSULIN 14.4 09/15/2021   INSULIN 20.3 04/08/2021   INSULIN 22.0 09/27/2018   INSULIN 16.3 03/17/2018   INSULIN 20.9 12/08/2017   Lab Results  Component Value Date   TSH 1.11 12/24/2021   Lab Results  Component Value Date   CHOL 241 (H)  12/24/2021   HDL 47.30 12/24/2021   LDLCALC 178 (H) 12/24/2021   LDLDIRECT 177 (H) 10/28/2010   TRIG 80.0 12/24/2021   CHOLHDL 5 12/24/2021   Lab Results  Component Value Date   VD25OH 25.52 (L) 12/24/2021   VD25OH 39.4 09/15/2021   VD25OH 25.2 (L) 04/08/2021   Lab Results  Component Value Date   WBC 3.9 (L) 12/24/2021   HGB 12.6 12/24/2021   HCT 36.9 12/24/2021   MCV 86.1 12/24/2021   PLT 303.0 12/24/2021   No results found for: "IRON", "TIBC", "FERRITIN"  Attestation Statements:   Reviewed by clinician on day of visit: allergies, medications, problem list, medical history, surgical history, family history, social history, and previous encounter notes.  IMarcille Blanco, CMA, am acting as transcriptionist for Coralie Common, MD  I have reviewed the above documentation for accuracy and completeness, and I agree with the above. - Coralie Common, MD

## 2022-04-05 ENCOUNTER — Other Ambulatory Visit (INDEPENDENT_AMBULATORY_CARE_PROVIDER_SITE_OTHER): Payer: Self-pay | Admitting: Family Medicine

## 2022-04-05 DIAGNOSIS — E559 Vitamin D deficiency, unspecified: Secondary | ICD-10-CM

## 2022-04-06 ENCOUNTER — Other Ambulatory Visit (HOSPITAL_BASED_OUTPATIENT_CLINIC_OR_DEPARTMENT_OTHER): Payer: Self-pay

## 2022-04-14 ENCOUNTER — Encounter (INDEPENDENT_AMBULATORY_CARE_PROVIDER_SITE_OTHER): Payer: Self-pay | Admitting: Family Medicine

## 2022-04-14 ENCOUNTER — Other Ambulatory Visit (HOSPITAL_BASED_OUTPATIENT_CLINIC_OR_DEPARTMENT_OTHER): Payer: Self-pay

## 2022-04-14 ENCOUNTER — Ambulatory Visit (INDEPENDENT_AMBULATORY_CARE_PROVIDER_SITE_OTHER): Payer: 59 | Admitting: Family Medicine

## 2022-04-14 VITALS — BP 113/75 | HR 66 | Temp 97.8°F | Ht 65.0 in | Wt 259.0 lb

## 2022-04-14 DIAGNOSIS — Z6841 Body Mass Index (BMI) 40.0 and over, adult: Secondary | ICD-10-CM

## 2022-04-14 DIAGNOSIS — R632 Polyphagia: Secondary | ICD-10-CM

## 2022-04-14 DIAGNOSIS — E669 Obesity, unspecified: Secondary | ICD-10-CM

## 2022-04-14 DIAGNOSIS — E559 Vitamin D deficiency, unspecified: Secondary | ICD-10-CM

## 2022-04-14 MED ORDER — LOMAIRA 8 MG PO TABS
8.0000 mg | ORAL_TABLET | Freq: Every morning | ORAL | 0 refills | Status: DC
  Filled 2022-04-14 – 2022-04-15 (×2): qty 30, 30d supply, fill #0

## 2022-04-14 MED ORDER — CHOLECALCIFEROL 1.25 MG (50000 UT) PO CAPS
50000.0000 [IU] | ORAL_CAPSULE | ORAL | 0 refills | Status: DC
  Filled 2022-04-14: qty 4, 28d supply, fill #0

## 2022-04-14 MED ORDER — TOPIRAMATE 25 MG PO TABS
50.0000 mg | ORAL_TABLET | Freq: Every morning | ORAL | 0 refills | Status: DC
  Filled 2022-04-14: qty 45, 22d supply, fill #0

## 2022-04-15 ENCOUNTER — Other Ambulatory Visit (HOSPITAL_BASED_OUTPATIENT_CLINIC_OR_DEPARTMENT_OTHER): Payer: Self-pay

## 2022-04-16 NOTE — Progress Notes (Signed)
Chief Complaint:   OBESITY Danielle Shaw is here to discuss her progress with her obesity treatment plan along with follow-up of her obesity related diagnoses. Danielle Shaw is on the Category 3 Plan and states she is following her eating plan approximately 80% of the time. Danielle Shaw states she is walking at work for 16 minutes 6 times per week.  Today's visit was #: 14 Starting weight: 312 lbs Starting date: 04/08/2021 Today's weight: 259 lbs Today's date: 04/14/22 Total lbs lost to date: 64 Total lbs lost since last in-office visit: 0  Interim History: Patient has not yet moved to [place]-has not had the chance to look for places yet.  She has not been getting all of the food in-very stressful and only at home 3 days out of the month.  Patient is not purposely missing meals but has many work stressors.  She is planning to go to the grocery store today.  Subjective:   1. Vitamin D deficiency No nausea, vomiting, muscle weakness.  Reports fatigue. Last vitamin D level of 25.  2. Polyphagia Starting weight 259. PDMP checked.  No issues. Need weight loss of 13 pounds by Christmas.    Assessment/Plan:   1. Vitamin D deficiency Refill: - Cholecalciferol 1.25 MG (50000 UT) capsule; Take 1 capsule (50,000 Units total) by mouth once a week.  Dispense: 4 capsule; Refill: 0  2. Polyphagia Increase Lomaira to 12 mg daily. Increase topiramate to 50 mg daily. - Phentermine HCl (LOMAIRA) 8 MG TABS; Take 8 mg by mouth in the morning.  Dispense: 30 tablet; Refill: 0 - topiramate (TOPAMAX) 25 MG tablet; Take 2 tablets (50 mg total) by mouth every morning.  Dispense: 45 tablet; Refill: 0  3. Obesity with current BMI of 43.2 Danielle Shaw is currently in the action stage of change. As such, her goal is to continue with weight loss efforts. She has agreed to the Category 3 Plan.   Exercise goals: as is.  Behavioral modification strategies: increasing lean protein intake, meal planning and cooking strategies,  keeping healthy foods in the home, and planning for success.  Danielle Shaw has agreed to follow-up with our clinic in 3-4 weeks. She was informed of the importance of frequent follow-up visits to maximize her success with intensive lifestyle modifications for her multiple health conditions.   Objective:   Blood pressure 113/75, pulse 66, temperature 97.8 F (36.6 C), height '5\' 5"'$  (1.651 m), weight 259 lb (117.5 kg), SpO2 98 %. Body mass index is 43.1 kg/m.  General: Cooperative, alert, well developed, in no acute distress. HEENT: Conjunctivae and lids unremarkable. Cardiovascular: Regular rhythm.  Lungs: Normal work of breathing. Neurologic: No focal deficits.   Lab Results  Component Value Date   CREATININE 0.70 12/24/2021   BUN 17 12/24/2021   NA 138 12/24/2021   K 3.5 12/24/2021   CL 101 12/24/2021   CO2 27 12/24/2021   Lab Results  Component Value Date   ALT 15 12/24/2021   AST 21 12/24/2021   ALKPHOS 79 12/24/2021   BILITOT 0.7 12/24/2021   Lab Results  Component Value Date   HGBA1C 5.4 12/24/2021   HGBA1C 5.6 09/15/2021   HGBA1C 6.3 (H) 04/08/2021   HGBA1C 6.0 (H) 09/27/2018   HGBA1C 6.0 (H) 03/17/2018   Lab Results  Component Value Date   INSULIN 14.4 09/15/2021   INSULIN 20.3 04/08/2021   INSULIN 22.0 09/27/2018   INSULIN 16.3 03/17/2018   INSULIN 20.9 12/08/2017   Lab Results  Component Value Date  TSH 1.11 12/24/2021   Lab Results  Component Value Date   CHOL 241 (H) 12/24/2021   HDL 47.30 12/24/2021   LDLCALC 178 (H) 12/24/2021   LDLDIRECT 177 (H) 10/28/2010   TRIG 80.0 12/24/2021   CHOLHDL 5 12/24/2021   Lab Results  Component Value Date   VD25OH 25.52 (L) 12/24/2021   VD25OH 39.4 09/15/2021   VD25OH 25.2 (L) 04/08/2021   Lab Results  Component Value Date   WBC 3.9 (L) 12/24/2021   HGB 12.6 12/24/2021   HCT 36.9 12/24/2021   MCV 86.1 12/24/2021   PLT 303.0 12/24/2021   No results found for: "IRON", "TIBC", "FERRITIN"   Attestation  Statements:   Reviewed by clinician on day of visit: allergies, medications, problem list, medical history, surgical history, family history, social history, and previous encounter notes.  I, Dawn Whitmire, FNP-C, am acting as Location manager for Coralie Common, MD.  I have reviewed the above documentation for accuracy and completeness, and I agree with the above. - Coralie Common, MD

## 2022-04-23 ENCOUNTER — Other Ambulatory Visit (HOSPITAL_BASED_OUTPATIENT_CLINIC_OR_DEPARTMENT_OTHER): Payer: Self-pay

## 2022-04-23 ENCOUNTER — Other Ambulatory Visit (INDEPENDENT_AMBULATORY_CARE_PROVIDER_SITE_OTHER): Payer: Self-pay | Admitting: Family Medicine

## 2022-04-23 DIAGNOSIS — R632 Polyphagia: Secondary | ICD-10-CM

## 2022-04-23 MED ORDER — LOMAIRA 8 MG PO TABS
8.0000 mg | ORAL_TABLET | Freq: Every morning | ORAL | 0 refills | Status: DC
  Filled 2022-04-23 – 2022-05-02 (×2): qty 15, 15d supply, fill #0

## 2022-05-02 ENCOUNTER — Other Ambulatory Visit (INDEPENDENT_AMBULATORY_CARE_PROVIDER_SITE_OTHER): Payer: Self-pay | Admitting: Family Medicine

## 2022-05-02 DIAGNOSIS — E559 Vitamin D deficiency, unspecified: Secondary | ICD-10-CM

## 2022-05-02 DIAGNOSIS — R632 Polyphagia: Secondary | ICD-10-CM

## 2022-05-03 ENCOUNTER — Encounter (HOSPITAL_BASED_OUTPATIENT_CLINIC_OR_DEPARTMENT_OTHER): Payer: Self-pay | Admitting: Pharmacist

## 2022-05-03 ENCOUNTER — Other Ambulatory Visit (HOSPITAL_BASED_OUTPATIENT_CLINIC_OR_DEPARTMENT_OTHER): Payer: Self-pay

## 2022-05-04 ENCOUNTER — Other Ambulatory Visit (HOSPITAL_BASED_OUTPATIENT_CLINIC_OR_DEPARTMENT_OTHER): Payer: Self-pay

## 2022-05-13 ENCOUNTER — Other Ambulatory Visit (HOSPITAL_BASED_OUTPATIENT_CLINIC_OR_DEPARTMENT_OTHER): Payer: Self-pay

## 2022-05-13 ENCOUNTER — Encounter (INDEPENDENT_AMBULATORY_CARE_PROVIDER_SITE_OTHER): Payer: Self-pay | Admitting: Family Medicine

## 2022-05-13 ENCOUNTER — Ambulatory Visit (INDEPENDENT_AMBULATORY_CARE_PROVIDER_SITE_OTHER): Payer: 59 | Admitting: Family Medicine

## 2022-05-13 VITALS — BP 138/81 | HR 73 | Temp 98.3°F | Ht 65.0 in | Wt 268.0 lb

## 2022-05-13 DIAGNOSIS — Z6841 Body Mass Index (BMI) 40.0 and over, adult: Secondary | ICD-10-CM | POA: Diagnosis not present

## 2022-05-13 DIAGNOSIS — E669 Obesity, unspecified: Secondary | ICD-10-CM | POA: Diagnosis not present

## 2022-05-13 DIAGNOSIS — E559 Vitamin D deficiency, unspecified: Secondary | ICD-10-CM | POA: Diagnosis not present

## 2022-05-13 DIAGNOSIS — R7303 Prediabetes: Secondary | ICD-10-CM | POA: Diagnosis not present

## 2022-05-13 MED ORDER — TRULICITY 0.75 MG/0.5ML ~~LOC~~ SOAJ
0.7500 mg | SUBCUTANEOUS | 0 refills | Status: DC
  Filled 2022-05-13: qty 2, 28d supply, fill #0

## 2022-05-13 MED ORDER — CHOLECALCIFEROL 1.25 MG (50000 UT) PO CAPS
50000.0000 [IU] | ORAL_CAPSULE | ORAL | 0 refills | Status: DC
  Filled 2022-05-13: qty 4, 28d supply, fill #0

## 2022-05-14 ENCOUNTER — Other Ambulatory Visit (HOSPITAL_BASED_OUTPATIENT_CLINIC_OR_DEPARTMENT_OTHER): Payer: Self-pay

## 2022-05-19 ENCOUNTER — Other Ambulatory Visit (HOSPITAL_BASED_OUTPATIENT_CLINIC_OR_DEPARTMENT_OTHER): Payer: Self-pay

## 2022-05-19 NOTE — Progress Notes (Signed)
Chief Complaint:   OBESITY Danielle Shaw is here to discuss her progress with her obesity treatment plan along with follow-up of her obesity related diagnoses. Danielle Shaw is on the Category 3 Plan and states she is following her eating plan approximately 90% of the time. Danielle Shaw states she is walking 40 minutes 5 times per week.  Today's visit was #: 15 Starting weight: 312 lbs Starting date: 04/08/2021 Today's weight: 268 lbs Today's date: 05/13/2022 Total lbs lost to date: 44 lbs Total lbs lost since last in-office visit: 0  Interim History: Danielle Shaw is struggling lately with move plan over the next 2 weeks. Reports feels Topamax is causing problems with knee pain/arthritis. Working to get BOT (back on track).  Subjective:   1. Prediabetes Danielle Shaw previously on Mounjaro--last A1c 5.4 (high of 6.3 in past year). Insurance has denied other GLP-1--discussed treatment options.  2. Vitamin D deficiency Danielle Shaw is currently taking prescription Vit D 50,000 IU once a week. Denies any side effects.  Assessment/Plan:   1. Prediabetes Start Trulicity 3.24 mg once weekly for 1 month with 0 refills.  -Start Dulaglutide (TRULICITY) 4.01 UU/7.2ZD SOPN; Inject 0.75 mg into the skin once a week.  Dispense: 2 mL; Refill: 0  2. Vitamin D deficiency We will refill Vit D 50k IU once a week for 1 month with 0 refills.  -Refill Cholecalciferol 1.25 MG (50000 UT) capsule; Take 1 capsule (50,000 Units total) by mouth once a week.  Dispense: 4 capsule; Refill: 0  3. Obesity with current BMI of 44.6 Danielle Shaw is currently in the action stage of change. As such, her goal is to continue with weight loss efforts. She has agreed to the Category 3 Plan.   Danielle Shaw will discontinue Phentermine and Topamax. Continue with diet and exercises.  Exercise goals: As is.  Behavioral modification strategies: increasing lean protein intake, decreasing simple carbohydrates, and meal planning and cooking strategies.  Danielle Shaw has agreed  to follow-up with our clinic in 4 weeks. She was informed of the importance of frequent follow-up visits to maximize her success with intensive lifestyle modifications for her multiple health conditions.   Objective:   Blood pressure 138/81, pulse 73, temperature 98.3 F (36.8 C), height '5\' 5"'$  (1.651 m), weight 268 lb (121.6 kg), SpO2 99 %. Body mass index is 44.6 kg/m.  General: Cooperative, alert, well developed, in no acute distress. HEENT: Conjunctivae and lids unremarkable. Cardiovascular: Regular rhythm.  Lungs: Normal work of breathing. Neurologic: No focal deficits.   Lab Results  Component Value Date   CREATININE 0.70 12/24/2021   BUN 17 12/24/2021   NA 138 12/24/2021   K 3.5 12/24/2021   CL 101 12/24/2021   CO2 27 12/24/2021   Lab Results  Component Value Date   ALT 15 12/24/2021   AST 21 12/24/2021   ALKPHOS 79 12/24/2021   BILITOT 0.7 12/24/2021   Lab Results  Component Value Date   HGBA1C 5.4 12/24/2021   HGBA1C 5.6 09/15/2021   HGBA1C 6.3 (H) 04/08/2021   HGBA1C 6.0 (H) 09/27/2018   HGBA1C 6.0 (H) 03/17/2018   Lab Results  Component Value Date   INSULIN 14.4 09/15/2021   INSULIN 20.3 04/08/2021   INSULIN 22.0 09/27/2018   INSULIN 16.3 03/17/2018   INSULIN 20.9 12/08/2017   Lab Results  Component Value Date   TSH 1.11 12/24/2021   Lab Results  Component Value Date   CHOL 241 (H) 12/24/2021   HDL 47.30 12/24/2021   LDLCALC 178 (H) 12/24/2021  LDLDIRECT 177 (H) 10/28/2010   TRIG 80.0 12/24/2021   CHOLHDL 5 12/24/2021   Lab Results  Component Value Date   VD25OH 25.52 (L) 12/24/2021   VD25OH 39.4 09/15/2021   VD25OH 25.2 (L) 04/08/2021   Lab Results  Component Value Date   WBC 3.9 (L) 12/24/2021   HGB 12.6 12/24/2021   HCT 36.9 12/24/2021   MCV 86.1 12/24/2021   PLT 303.0 12/24/2021   No results found for: "IRON", "TIBC", "FERRITIN"  Attestation Statements:   Reviewed by clinician on day of visit: allergies, medications, problem  list, medical history, surgical history, family history, social history, and previous encounter notes.  I, Elnora Morrison, RMA am acting as transcriptionist for Coralie Common, MD.  I have reviewed the above documentation for accuracy and completeness, and I agree with the above. - Coralie Common, MD

## 2022-05-23 ENCOUNTER — Other Ambulatory Visit (HOSPITAL_BASED_OUTPATIENT_CLINIC_OR_DEPARTMENT_OTHER): Payer: Self-pay

## 2022-05-26 ENCOUNTER — Other Ambulatory Visit (HOSPITAL_BASED_OUTPATIENT_CLINIC_OR_DEPARTMENT_OTHER): Payer: Self-pay

## 2022-05-28 ENCOUNTER — Other Ambulatory Visit (HOSPITAL_BASED_OUTPATIENT_CLINIC_OR_DEPARTMENT_OTHER): Payer: Self-pay

## 2022-06-03 ENCOUNTER — Other Ambulatory Visit (HOSPITAL_BASED_OUTPATIENT_CLINIC_OR_DEPARTMENT_OTHER): Payer: Self-pay

## 2022-06-08 ENCOUNTER — Other Ambulatory Visit (HOSPITAL_BASED_OUTPATIENT_CLINIC_OR_DEPARTMENT_OTHER): Payer: Self-pay

## 2022-06-16 ENCOUNTER — Other Ambulatory Visit (HOSPITAL_BASED_OUTPATIENT_CLINIC_OR_DEPARTMENT_OTHER): Payer: Self-pay

## 2022-06-23 ENCOUNTER — Other Ambulatory Visit (HOSPITAL_BASED_OUTPATIENT_CLINIC_OR_DEPARTMENT_OTHER): Payer: Self-pay

## 2022-06-23 ENCOUNTER — Encounter (INDEPENDENT_AMBULATORY_CARE_PROVIDER_SITE_OTHER): Payer: Self-pay | Admitting: Family Medicine

## 2022-06-23 ENCOUNTER — Ambulatory Visit (INDEPENDENT_AMBULATORY_CARE_PROVIDER_SITE_OTHER): Payer: 59 | Admitting: Family Medicine

## 2022-06-23 VITALS — BP 143/87 | HR 67 | Temp 98.0°F | Ht 65.0 in | Wt 272.0 lb

## 2022-06-23 DIAGNOSIS — R0602 Shortness of breath: Secondary | ICD-10-CM | POA: Diagnosis not present

## 2022-06-23 DIAGNOSIS — E7849 Other hyperlipidemia: Secondary | ICD-10-CM | POA: Diagnosis not present

## 2022-06-23 DIAGNOSIS — E669 Obesity, unspecified: Secondary | ICD-10-CM

## 2022-06-23 DIAGNOSIS — E559 Vitamin D deficiency, unspecified: Secondary | ICD-10-CM | POA: Diagnosis not present

## 2022-06-23 DIAGNOSIS — I10 Essential (primary) hypertension: Secondary | ICD-10-CM | POA: Diagnosis not present

## 2022-06-23 DIAGNOSIS — R7303 Prediabetes: Secondary | ICD-10-CM

## 2022-06-23 DIAGNOSIS — Z6841 Body Mass Index (BMI) 40.0 and over, adult: Secondary | ICD-10-CM

## 2022-06-23 MED ORDER — SPIRONOLACTONE 25 MG PO TABS
25.0000 mg | ORAL_TABLET | Freq: Every day | ORAL | 0 refills | Status: DC
  Filled 2022-06-23: qty 30, 30d supply, fill #0
  Filled 2022-07-24: qty 30, 30d supply, fill #1
  Filled 2022-08-30: qty 30, 30d supply, fill #2

## 2022-06-23 MED ORDER — TRULICITY 0.75 MG/0.5ML ~~LOC~~ SOAJ
0.7500 mg | SUBCUTANEOUS | 0 refills | Status: DC
  Filled 2022-06-23 – 2022-07-24 (×2): qty 2, 28d supply, fill #0

## 2022-06-23 MED ORDER — LOSARTAN POTASSIUM 100 MG PO TABS
100.0000 mg | ORAL_TABLET | Freq: Every day | ORAL | 0 refills | Status: DC
  Filled 2022-06-23: qty 30, 30d supply, fill #0
  Filled 2022-07-24 (×2): qty 30, 30d supply, fill #1
  Filled 2022-08-30: qty 30, 30d supply, fill #2

## 2022-06-23 MED ORDER — ROSUVASTATIN CALCIUM 20 MG PO TABS
20.0000 mg | ORAL_TABLET | Freq: Every day | ORAL | 0 refills | Status: DC
  Filled 2022-06-23: qty 30, 30d supply, fill #0
  Filled 2022-07-24: qty 30, 30d supply, fill #1
  Filled 2022-08-30: qty 30, 30d supply, fill #2

## 2022-06-24 ENCOUNTER — Encounter (HOSPITAL_BASED_OUTPATIENT_CLINIC_OR_DEPARTMENT_OTHER): Payer: Self-pay

## 2022-06-24 ENCOUNTER — Other Ambulatory Visit (HOSPITAL_BASED_OUTPATIENT_CLINIC_OR_DEPARTMENT_OTHER): Payer: Self-pay

## 2022-06-24 LAB — LIPID PANEL WITH LDL/HDL RATIO
Cholesterol, Total: 180 mg/dL (ref 100–199)
HDL: 60 mg/dL (ref >39)
LDL Chol Calc (NIH): 109 mg/dL — ABNORMAL HIGH (ref 0–99)
LDL/HDL Ratio: 1.8 ratio (ref 0.0–3.2)
Triglycerides: 55 mg/dL (ref 0–149)
VLDL Cholesterol Cal: 11 mg/dL (ref 5–40)

## 2022-06-24 LAB — COMPREHENSIVE METABOLIC PANEL
ALT: 22 IU/L (ref 0–32)
AST: 21 IU/L (ref 0–40)
Albumin/Globulin Ratio: 1.5 (ref 1.2–2.2)
Albumin: 4.5 g/dL (ref 3.9–4.9)
Alkaline Phosphatase: 109 IU/L (ref 44–121)
BUN/Creatinine Ratio: 25 — ABNORMAL HIGH (ref 9–23)
BUN: 19 mg/dL (ref 6–24)
Bilirubin Total: 0.4 mg/dL (ref 0.0–1.2)
CO2: 25 mmol/L (ref 20–29)
Calcium: 9.7 mg/dL (ref 8.7–10.2)
Chloride: 103 mmol/L (ref 96–106)
Creatinine, Ser: 0.75 mg/dL (ref 0.57–1.00)
Globulin, Total: 3 g/dL (ref 1.5–4.5)
Glucose: 96 mg/dL (ref 70–99)
Potassium: 4.6 mmol/L (ref 3.5–5.2)
Sodium: 142 mmol/L (ref 134–144)
Total Protein: 7.5 g/dL (ref 6.0–8.5)
eGFR: 98 mL/min/{1.73_m2} (ref >59)

## 2022-06-24 LAB — VITAMIN D 25 HYDROXY (VIT D DEFICIENCY, FRACTURES): Vit D, 25-Hydroxy: 40 ng/mL (ref 30.0–100.0)

## 2022-06-24 LAB — HEMOGLOBIN A1C
Est. average glucose Bld gHb Est-mCnc: 114 mg/dL
Hgb A1c MFr Bld: 5.6 % (ref 4.8–5.6)

## 2022-06-24 LAB — INSULIN, RANDOM: INSULIN: 25.3 u[IU]/mL — ABNORMAL HIGH (ref 2.6–24.9)

## 2022-06-25 ENCOUNTER — Ambulatory Visit: Payer: 59 | Admitting: Internal Medicine

## 2022-07-01 ENCOUNTER — Ambulatory Visit (INDEPENDENT_AMBULATORY_CARE_PROVIDER_SITE_OTHER): Payer: 59 | Admitting: Internal Medicine

## 2022-07-01 VITALS — BP 136/85 | HR 74 | Temp 98.1°F | Wt 280.6 lb

## 2022-07-01 DIAGNOSIS — I10 Essential (primary) hypertension: Secondary | ICD-10-CM

## 2022-07-01 DIAGNOSIS — G4731 Primary central sleep apnea: Secondary | ICD-10-CM

## 2022-07-01 DIAGNOSIS — Z79891 Long term (current) use of opiate analgesic: Secondary | ICD-10-CM

## 2022-07-01 DIAGNOSIS — E782 Mixed hyperlipidemia: Secondary | ICD-10-CM | POA: Diagnosis not present

## 2022-07-01 NOTE — Progress Notes (Signed)
Established Patient Office Visit     CC/Reason for Visit: 50-monthfollow-up chronic conditions  HPI: Danielle REARDONis a 51y.o. female who is coming in today for the above mentioned reasons. Past Medical History is significant for: Hypertension, morbid obesity, hyperlipidemia, impaired glucose tolerance, OSA.  She is feeling well today and has no acute concerns or complaints.   Past Medical/Surgical History: Past Medical History:  Diagnosis Date   Arrhythmia    Arthritis    GERD (gastroesophageal reflux disease)    Hyperlipidemia    Hypertension    IUD 2004   IUD 2009   Migraine    Palpitations    Sleep apnea    SOB (shortness of breath)     Past Surgical History:  Procedure Laterality Date   BREAST BIOPSY Right 2011   BREAST SURGERY  2000   reduction   CESAREAN SECTION     COLONOSCOPY WITH PROPOFOL N/A 07/11/2018   Procedure: COLONOSCOPY WITH PROPOFOL;  Surgeon: BThornton Park MD;  Location: [facility];  Service: Gastroenterology;  Laterality: N/A;   REDUCTION MAMMAPLASTY      Social History:  reports that she has never smoked. She has never used smokeless tobacco. She reports current alcohol use. She reports that she does not use drugs.  Allergies: Allergies  Allergen Reactions   Latex    Lisinopril Cough   Metoprolol Other (See Comments)    Funny feeling    Family History:  Family History  Problem Relation Age of Onset   Hyperlipidemia Mother    Hypertension Mother    Heart disease Mother 328  High Cholesterol Mother    High blood pressure Mother    Hyperlipidemia Father    Hypertension Father    Diabetes Father    Heart disease Father    High blood pressure Father    High Cholesterol Father    Heart disease Maternal Grandmother    Hodgkin's lymphoma Daughter      Current Outpatient Medications:    albuterol (VENTOLIN HFA) 108 (90 Base) MCG/ACT inhaler, INHALE 2 PUFFS BY MOUTH EVERY 6 HOURS AS NEEDED FOR WHEEZING OR SHORTNESS OF  BREATH, Disp: 6.7 g, Rfl: 1   Cholecalciferol 1.25 MG (50000 UT) capsule, Take 1 capsule (50,000 Units total) by mouth once a week., Disp: 4 capsule, Rfl: 0   cyanocobalamin (,VITAMIN B-12,) 1000 MCG/ML injection, Inject 1,000 mcg into the muscle once., Disp: , Rfl:    cyclobenzaprine (FLEXERIL) 5 MG tablet, Take 1 tablet (5 mg total) by mouth 3 (three) times daily as needed for muscle spasms., Disp: 30 tablet, Rfl: 1   Dulaglutide (TRULICITY) 02.42MPN/3.6RWSOPN, Inject 0.75 mg into the skin once a week., Disp: 2 mL, Rfl: 0   gabapentin (NEURONTIN) 100 MG capsule, Take 100 mg by mouth 3 (three) times daily., Disp: , Rfl:    levocetirizine (XYZAL) 5 MG tablet, Take 1 tablet (5 mg total) by mouth every evening., Disp: 90 tablet, Rfl: 1   losartan (COZAAR) 100 MG tablet, Take 1 tablet (100 mg total) by mouth daily., Disp: 90 tablet, Rfl: 0   modafinil (PROVIGIL) 200 MG tablet, Take 1 tablet (200 mg total) by mouth daily., Disp: 30 tablet, Rfl: 5   omeprazole (PRILOSEC) 20 MG capsule, Take 20 mg by mouth daily., Disp: , Rfl:    pantoprazole (PROTONIX) 40 MG tablet, Take 1 tablet (40 mg total) by mouth daily., Disp: 90 tablet, Rfl: 3   rosuvastatin (CRESTOR) 20 MG tablet,  Take 1 tablet (20 mg total) by mouth daily., Disp: 90 tablet, Rfl: 0   spironolactone (ALDACTONE) 25 MG tablet, Take 1 tablet (25 mg total) by mouth daily., Disp: 90 tablet, Rfl: 0  Review of Systems:  Constitutional: Denies fever, chills, diaphoresis, appetite change and fatigue.  HEENT: Denies photophobia, eye pain, redness, hearing loss, ear pain, congestion, sore throat, rhinorrhea, sneezing, mouth sores, trouble swallowing, neck pain, neck stiffness and tinnitus.   Respiratory: Denies SOB, DOE, cough, chest tightness,  and wheezing.   Cardiovascular: Denies chest pain, palpitations and leg swelling.  Gastrointestinal: Denies nausea, vomiting, abdominal pain, diarrhea, constipation, blood in stool and abdominal distention.   Genitourinary: Denies dysuria, urgency, frequency, hematuria, flank pain and difficulty urinating.  Endocrine: Denies: hot or cold intolerance, sweats, changes in hair or nails, polyuria, polydipsia. Musculoskeletal: Denies myalgias, back pain, joint swelling, arthralgias and gait problem.  Skin: Denies pallor, rash and wound.  Neurological: Denies dizziness, seizures, syncope, weakness, light-headedness, numbness and headaches.  Hematological: Denies adenopathy. Easy bruising, personal or family bleeding history  Psychiatric/Behavioral: Denies suicidal ideation, mood changes, confusion, nervousness, sleep disturbance and agitation    Physical Exam: Vitals:   07/01/22 1516 07/01/22 1521  BP: (!) 160/100 136/85  Pulse: 74   Temp: 98.1 F (36.7 C)   TempSrc: Oral   Weight: 280 lb 9.6 oz (127.3 kg)     Body mass index is 46.69 kg/m.   Constitutional: NAD, calm, comfortable Eyes: PERRL, lids and conjunctivae normal ENMT: Mucous membranes are moist.  Respiratory: clear to auscultation bilaterally, no wheezing, no crackles. Normal respiratory effort. No accessory muscle use.  Cardiovascular: Regular rate and rhythm, no murmurs / rubs / gallops. No extremity edema.  Psychiatric: Normal judgment and insight. Alert and oriented x 3. Normal mood.    Impression and Plan:  Mixed hyperlipidemia  HYPERTENSION, BENIGN SYSTEMIC  Morbid obesity ([name])  Central sleep apnea comorbid with prescribed opioid use   -Blood pressure is noted to be elevated today on 2 separate measurements.  She has been compliant with medication.  She will do ambulatory blood pressure monitoring and return in 3 months for follow-up. -She continues to follow with the weight and wellness clinic and has lost about 40 pounds.  Time spent:31 minutes reviewing chart, interviewing and examining patient and formulating plan of care.     Lelon Frohlich, MD  Primary Care at [facility]

## 2022-07-07 NOTE — Progress Notes (Signed)
Chief Complaint:   OBESITY Danielle Shaw is here to discuss her progress with her obesity treatment plan along with follow-up of her obesity related diagnoses. Danielle Shaw is on the Category 3 Plan and states she is following her eating plan approximately 70% of the time. Danielle Shaw states she is walking 90 minutes 7 times per week.  Today's visit was #: 41 Starting weight: 312 lbs Starting date: 04/08/2021 Today's weight: 272 lbs Today's date: 06/23/2022 Total lbs lost to date: 40 lbs Total lbs lost since last in-office visit: 0  Interim History: Danielle Shaw is fasting today.  Since last appointment she has been continuously working and has not had time off.  She will likely be working like this until January.  She is in a coworker have decided to get back on track for holiday season.  Subjective:   1. SOBOE (shortness of breath on exertion) Original IC of 1958. Symptoms slightly improved since 1st IC>  2. Essential hypertension Blood pressure slightly elevated today. Denies chest pain, chest pressure and headache. On Cozaar, Spironolactone.  3. Prediabetes A1c at 5.4. Previously on Mounjaro.  4. Other hyperlipidemia On stain with no myalgias or transaminitis.  5. Vitamin D deficiency Denies any nausea, vomiting or muscle weakness.  Assessment/Plan:   1. SOBOE (shortness of breath on exertion) IC today with improvement of RMR to 2189.  2. Essential hypertension We will refill Losartan 100 mg daily AND refill Crestor 20 mg daily AND refill Spironolactone 25 mg daily for 1 month with 0 refills.  -Refill losartan (COZAAR) 100 MG tablet; Take 1 tablet (100 mg total) by mouth daily.  Dispense: 90 tablet; Refill: 0 -Refill rosuvastatin (CRESTOR) 20 MG tablet; Take 1 tablet (20 mg total) by mouth daily.  Dispense: 90 tablet; Refill: 0 -Refill spironolactone (ALDACTONE) 25 MG tablet; Take 1 tablet (25 mg total) by mouth daily.  Dispense: 90 tablet; Refill: 0  3. Prediabetes We will obtain labs  today. We will refill Trulicity 4.23 mg SubQ once a week for 1 month with 0 refills.  -Refill Dulaglutide (TRULICITY) 5.36 RW/4.3XV SOPN; Inject 0.75 mg into the skin once a week.  Dispense: 2 mL; Refill: 0  - Comprehensive metabolic panel - Hemoglobin A1c - Insulin, random  4. Other hyperlipidemia We will obtain labs today.  - Lipid Panel With LDL/HDL Ratio  5. Vitamin D deficiency We will obtain labs today.  - VITAMIN D 25 Hydroxy (Vit-D Deficiency, Fractures)  6. Obesity with current BMI of 45.4 Danielle Shaw is currently in the action stage of change. As such, her goal is to continue with weight loss efforts. She has agreed to the Category 3 Plan, the Category 4 Plan, and keeping a food journal and adhering to recommended goals of 550-700 calories and 50 protein.   Exercise goals: As is.  Behavioral modification strategies: increasing lean protein intake, meal planning and cooking strategies, keeping healthy foods in the home, and planning for success.  Danielle Shaw has agreed to follow-up with our clinic in 5 weeks. She was informed of the importance of frequent follow-up visits to maximize her success with intensive lifestyle modifications for her multiple health conditions.   Danielle Shaw was informed we would discuss her lab results at her next visit unless there is a critical issue that needs to be addressed sooner. Danielle Shaw agreed to keep her next visit at the agreed upon time to discuss these results.  Objective:   Blood pressure (!) 143/87, pulse 67, temperature 98 F (36.7 C), height '5\' 5"'$  (  1.651 m), weight 272 lb (123.4 kg), SpO2 99 %. Body mass index is 45.26 kg/m.  General: Cooperative, alert, well developed, in no acute distress. HEENT: Conjunctivae and lids unremarkable. Cardiovascular: Regular rhythm.  Lungs: Normal work of breathing. Neurologic: No focal deficits.   Lab Results  Component Value Date   CREATININE 0.75 06/23/2022   BUN 19 06/23/2022   NA 142 06/23/2022   K 4.6  06/23/2022   CL 103 06/23/2022   CO2 25 06/23/2022   Lab Results  Component Value Date   ALT 22 06/23/2022   AST 21 06/23/2022   ALKPHOS 109 06/23/2022   BILITOT 0.4 06/23/2022   Lab Results  Component Value Date   HGBA1C 5.6 06/23/2022   HGBA1C 5.4 12/24/2021   HGBA1C 5.6 09/15/2021   HGBA1C 6.3 (H) 04/08/2021   HGBA1C 6.0 (H) 09/27/2018   Lab Results  Component Value Date   INSULIN 25.3 (H) 06/23/2022   INSULIN 14.4 09/15/2021   INSULIN 20.3 04/08/2021   INSULIN 22.0 09/27/2018   INSULIN 16.3 03/17/2018   Lab Results  Component Value Date   TSH 1.11 12/24/2021   Lab Results  Component Value Date   CHOL 180 06/23/2022   HDL 60 06/23/2022   LDLCALC 109 (H) 06/23/2022   LDLDIRECT 177 (H) 10/28/2010   TRIG 55 06/23/2022   CHOLHDL 5 12/24/2021   Lab Results  Component Value Date   VD25OH 40.0 06/23/2022   VD25OH 25.52 (L) 12/24/2021   VD25OH 39.4 09/15/2021   Lab Results  Component Value Date   WBC 3.9 (L) 12/24/2021   HGB 12.6 12/24/2021   HCT 36.9 12/24/2021   MCV 86.1 12/24/2021   PLT 303.0 12/24/2021   No results found for: "IRON", "TIBC", "FERRITIN"  Attestation Statements:   Reviewed by clinician on day of visit: allergies, medications, problem list, medical history, surgical history, family history, social history, and previous encounter notes.  I, Elnora Morrison, RMA am acting as transcriptionist for Coralie Common, MD.  I have reviewed the above documentation for accuracy and completeness, and I agree with the above. - Coralie Common, MD

## 2022-07-24 ENCOUNTER — Other Ambulatory Visit (HOSPITAL_BASED_OUTPATIENT_CLINIC_OR_DEPARTMENT_OTHER): Payer: Self-pay

## 2022-07-24 ENCOUNTER — Other Ambulatory Visit: Payer: Self-pay

## 2022-07-30 ENCOUNTER — Ambulatory Visit (INDEPENDENT_AMBULATORY_CARE_PROVIDER_SITE_OTHER): Payer: 59 | Admitting: Family Medicine

## 2022-08-17 ENCOUNTER — Encounter (HOSPITAL_BASED_OUTPATIENT_CLINIC_OR_DEPARTMENT_OTHER): Payer: Self-pay

## 2022-08-17 ENCOUNTER — Ambulatory Visit (INDEPENDENT_AMBULATORY_CARE_PROVIDER_SITE_OTHER): Payer: 59 | Admitting: Family Medicine

## 2022-08-17 ENCOUNTER — Other Ambulatory Visit (HOSPITAL_BASED_OUTPATIENT_CLINIC_OR_DEPARTMENT_OTHER): Payer: Self-pay

## 2022-08-17 ENCOUNTER — Encounter (INDEPENDENT_AMBULATORY_CARE_PROVIDER_SITE_OTHER): Payer: Self-pay | Admitting: Family Medicine

## 2022-08-17 VITALS — BP 129/87 | HR 78 | Temp 98.0°F | Ht 65.0 in | Wt 272.0 lb

## 2022-08-17 DIAGNOSIS — I10 Essential (primary) hypertension: Secondary | ICD-10-CM | POA: Diagnosis not present

## 2022-08-17 DIAGNOSIS — E559 Vitamin D deficiency, unspecified: Secondary | ICD-10-CM | POA: Diagnosis not present

## 2022-08-17 DIAGNOSIS — R7303 Prediabetes: Secondary | ICD-10-CM | POA: Diagnosis not present

## 2022-08-17 DIAGNOSIS — E669 Obesity, unspecified: Secondary | ICD-10-CM

## 2022-08-17 DIAGNOSIS — Z6841 Body Mass Index (BMI) 40.0 and over, adult: Secondary | ICD-10-CM

## 2022-08-17 MED ORDER — CHOLECALCIFEROL 1.25 MG (50000 UT) PO CAPS
50000.0000 [IU] | ORAL_CAPSULE | ORAL | 0 refills | Status: DC
  Filled 2022-08-17 – 2022-08-30 (×2): qty 4, 28d supply, fill #0

## 2022-08-17 MED ORDER — TIRZEPATIDE 2.5 MG/0.5ML ~~LOC~~ SOAJ
2.5000 mg | SUBCUTANEOUS | 0 refills | Status: DC
  Filled 2022-08-17: qty 2, 28d supply, fill #0

## 2022-08-17 NOTE — Telephone Encounter (Signed)
Please make sure new insurance card id added in system- Thanks

## 2022-08-18 NOTE — Telephone Encounter (Signed)
PA started

## 2022-08-22 ENCOUNTER — Other Ambulatory Visit (HOSPITAL_BASED_OUTPATIENT_CLINIC_OR_DEPARTMENT_OTHER): Payer: Self-pay

## 2022-08-27 ENCOUNTER — Other Ambulatory Visit (HOSPITAL_BASED_OUTPATIENT_CLINIC_OR_DEPARTMENT_OTHER): Payer: Self-pay

## 2022-08-30 ENCOUNTER — Other Ambulatory Visit (HOSPITAL_BASED_OUTPATIENT_CLINIC_OR_DEPARTMENT_OTHER): Payer: Self-pay

## 2022-08-31 ENCOUNTER — Other Ambulatory Visit (HOSPITAL_BASED_OUTPATIENT_CLINIC_OR_DEPARTMENT_OTHER): Payer: Self-pay

## 2022-08-31 ENCOUNTER — Other Ambulatory Visit: Payer: Self-pay

## 2022-08-31 NOTE — Progress Notes (Unsigned)
Chief Complaint:   OBESITY Danielle Shaw is here to discuss her progress with her obesity treatment plan along with follow-up of her obesity related diagnoses. Danielle Shaw is on the Category 3 Plan and the Category 4 Plan+ 550-700 and states she is following her eating plan approximately 100% of the time. Danielle Shaw states she is walking 30-45 minutes 6 times per week.  Today's visit was #: 37 Starting weight: 312 lbs Starting date: 04/08/2021 Today's weight: 272 lbs Today's date: 08/17/2022 Total lbs lost to date: 40 lbs Total lbs lost since last in-office visit: 0  Interim History: Danielle Shaw worked through the holidays and took day after [employer] off.  Has gotten back on track again in terms of following meal plan.  Wants to stick with meal plan for the next few weeks.  Wondering about AOM.  Demands at work has slowed.  Subjective:   1. Vitamin D deficiency Danielle Shaw is currently taking prescription Vit D 50,000 IU once a week.  Denies any nausea, vomiting or muscle weakness.  She notes fatigue.  2. Essential hypertension Blood pressure is controlled today.  Denies chest pain, chest pressure and headache.  On Cozaar, and Spironolactone.  3. Prediabetes Prescription for Trulicity not filled.  Some carb cravings.  Assessment/Plan:   1. Vitamin D deficiency We will refill  Cholecalciferol 1.25 MG IU once a week for 1 month with 0 refills.  -Refill  (50000 UT) capsule; Take 1 capsule (50,000 Units total) by mouth once a week.  Dispense: 4 capsule; Refill: 0  2. Essential hypertension Continue current medications without changes in dose.  3. Prediabetes Start Mounjaro 2.5 SubQ once a week for 1 month with 0 refills.  -Start tirzepatide [facility]) 2.5 MG/0.5ML Pen; Inject 2.5 mg into the skin once a week.  Dispense: 2 mL; Refill: 0  4. Obesity with current BMI of 45.3 Danielle Shaw is currently in the action stage of change. As such, her goal is to continue with weight loss efforts. She has agreed to the  Category 3 Plan and the Category 4 Plan.   Exercise goals: All adults should avoid inactivity. Some physical activity is better than none, and adults who participate in any amount of physical activity gain some health benefits.  Behavioral modification strategies: increasing lean protein intake, meal planning and cooking strategies, keeping healthy foods in the home, avoiding temptations, and planning for success.  Danielle Shaw has agreed to follow-up with our clinic in 4 weeks. She was informed of the importance of frequent follow-up visits to maximize her success with intensive lifestyle modifications for her multiple health conditions.   Objective:   Blood pressure 129/87, pulse 78, temperature 98 F (36.7 C), height '5\' 5"'$  (1.651 m), weight 272 lb (123.4 kg), SpO2 98 %. Body mass index is 45.26 kg/m.  General: Cooperative, alert, well developed, in no acute distress. HEENT: Conjunctivae and lids unremarkable. Cardiovascular: Regular rhythm.  Lungs: Normal work of breathing. Neurologic: No focal deficits.   Lab Results  Component Value Date   CREATININE 0.75 06/23/2022   BUN 19 06/23/2022   NA 142 06/23/2022   K 4.6 06/23/2022   CL 103 06/23/2022   CO2 25 06/23/2022   Lab Results  Component Value Date   ALT 22 06/23/2022   AST 21 06/23/2022   ALKPHOS 109 06/23/2022   BILITOT 0.4 06/23/2022   Lab Results  Component Value Date   HGBA1C 5.6 06/23/2022   HGBA1C 5.4 12/24/2021   HGBA1C 5.6 09/15/2021   HGBA1C 6.3 (  H) 04/08/2021   HGBA1C 6.0 (H) 09/27/2018   Lab Results  Component Value Date   INSULIN 25.3 (H) 06/23/2022   INSULIN 14.4 09/15/2021   INSULIN 20.3 04/08/2021   INSULIN 22.0 09/27/2018   INSULIN 16.3 03/17/2018   Lab Results  Component Value Date   TSH 1.11 12/24/2021   Lab Results  Component Value Date   CHOL 180 06/23/2022   HDL 60 06/23/2022   LDLCALC 109 (H) 06/23/2022   LDLDIRECT 177 (H) 10/28/2010   TRIG 55 06/23/2022   CHOLHDL 5 12/24/2021    Lab Results  Component Value Date   VD25OH 40.0 06/23/2022   VD25OH 25.52 (L) 12/24/2021   VD25OH 39.4 09/15/2021   Lab Results  Component Value Date   WBC 3.9 (L) 12/24/2021   HGB 12.6 12/24/2021   HCT 36.9 12/24/2021   MCV 86.1 12/24/2021   PLT 303.0 12/24/2021   No results found for: "IRON", "TIBC", "FERRITIN"  Attestation Statements:   Reviewed by clinician on day of visit: allergies, medications, problem list, medical history, surgical history, family history, social history, and previous encounter notes.  I, Elnora Morrison, RMA am acting as transcriptionist for Coralie Common, MD.  I have reviewed the above documentation for accuracy and completeness, and I agree with the above. - Coralie Common, MD

## 2022-09-06 ENCOUNTER — Other Ambulatory Visit (HOSPITAL_BASED_OUTPATIENT_CLINIC_OR_DEPARTMENT_OTHER): Payer: Self-pay

## 2022-09-15 ENCOUNTER — Ambulatory Visit (INDEPENDENT_AMBULATORY_CARE_PROVIDER_SITE_OTHER): Payer: 59 | Admitting: Family Medicine

## 2022-09-21 ENCOUNTER — Other Ambulatory Visit (HOSPITAL_BASED_OUTPATIENT_CLINIC_OR_DEPARTMENT_OTHER): Payer: Self-pay

## 2022-10-02 ENCOUNTER — Other Ambulatory Visit (HOSPITAL_BASED_OUTPATIENT_CLINIC_OR_DEPARTMENT_OTHER): Payer: Self-pay

## 2022-10-06 ENCOUNTER — Encounter (INDEPENDENT_AMBULATORY_CARE_PROVIDER_SITE_OTHER): Payer: Self-pay | Admitting: Family Medicine

## 2022-10-06 ENCOUNTER — Other Ambulatory Visit (HOSPITAL_BASED_OUTPATIENT_CLINIC_OR_DEPARTMENT_OTHER): Payer: Self-pay

## 2022-10-06 ENCOUNTER — Ambulatory Visit (INDEPENDENT_AMBULATORY_CARE_PROVIDER_SITE_OTHER): Payer: 59 | Admitting: Family Medicine

## 2022-10-06 VITALS — BP 138/89 | HR 73 | Temp 98.6°F | Ht 65.0 in | Wt 283.0 lb

## 2022-10-06 DIAGNOSIS — I10 Essential (primary) hypertension: Secondary | ICD-10-CM

## 2022-10-06 DIAGNOSIS — E559 Vitamin D deficiency, unspecified: Secondary | ICD-10-CM

## 2022-10-06 DIAGNOSIS — Z6841 Body Mass Index (BMI) 40.0 and over, adult: Secondary | ICD-10-CM

## 2022-10-06 DIAGNOSIS — E669 Obesity, unspecified: Secondary | ICD-10-CM | POA: Diagnosis not present

## 2022-10-06 DIAGNOSIS — E7849 Other hyperlipidemia: Secondary | ICD-10-CM | POA: Diagnosis not present

## 2022-10-06 MED ORDER — PREVIDENT 5000 SENSITIVE 1.1-5 % DT GEL
DENTAL | 5 refills | Status: AC
  Filled 2022-10-06: qty 100, 30d supply, fill #0
  Filled 2022-12-09: qty 100, 30d supply, fill #1
  Filled 2023-03-27 – 2023-05-12 (×2): qty 100, 30d supply, fill #2
  Filled 2023-08-18: qty 100, 30d supply, fill #3

## 2022-10-06 MED ORDER — ROSUVASTATIN CALCIUM 20 MG PO TABS
20.0000 mg | ORAL_TABLET | Freq: Every day | ORAL | 0 refills | Status: DC
  Filled 2022-10-06: qty 30, 30d supply, fill #0
  Filled 2022-10-27 – 2022-11-14 (×2): qty 30, 30d supply, fill #1
  Filled 2022-12-09: qty 30, 30d supply, fill #2

## 2022-10-06 MED ORDER — CHOLECALCIFEROL 1.25 MG (50000 UT) PO CAPS
50000.0000 [IU] | ORAL_CAPSULE | ORAL | 0 refills | Status: DC
  Filled 2022-10-06: qty 4, 28d supply, fill #0

## 2022-10-06 MED ORDER — LOSARTAN POTASSIUM 100 MG PO TABS
100.0000 mg | ORAL_TABLET | Freq: Every day | ORAL | 0 refills | Status: DC
  Filled 2022-10-06: qty 30, 30d supply, fill #0
  Filled 2022-10-27 – 2022-11-14 (×2): qty 30, 30d supply, fill #1
  Filled 2022-12-09: qty 30, 30d supply, fill #2

## 2022-10-06 NOTE — Progress Notes (Deleted)
Patient returned to clinic for first time since 08/17/22.  Went to [place] and on a cruise to [place] and [place].  Was supposed to go to [place] but wind was too intense.  Since last appointment she was not really monitoring food choices or quantity.  Vacation is over and she returns to work next Tuesday.  She has been going between category 3 and category 4.  Plans to go grocery shopping tomorrow. No other obstacles or plans anticipated.  Having a masquerade sneaker ball at the end of the month in celebration of her birthday and in honor of her mother.

## 2022-10-07 ENCOUNTER — Other Ambulatory Visit (HOSPITAL_BASED_OUTPATIENT_CLINIC_OR_DEPARTMENT_OTHER): Payer: Self-pay

## 2022-10-07 ENCOUNTER — Ambulatory Visit: Payer: 59 | Admitting: Internal Medicine

## 2022-10-12 ENCOUNTER — Ambulatory Visit (INDEPENDENT_AMBULATORY_CARE_PROVIDER_SITE_OTHER): Payer: 59

## 2022-10-12 ENCOUNTER — Ambulatory Visit (INDEPENDENT_AMBULATORY_CARE_PROVIDER_SITE_OTHER): Payer: 59 | Admitting: Internal Medicine

## 2022-10-12 ENCOUNTER — Other Ambulatory Visit (HOSPITAL_BASED_OUTPATIENT_CLINIC_OR_DEPARTMENT_OTHER): Payer: Self-pay

## 2022-10-12 ENCOUNTER — Other Ambulatory Visit (INDEPENDENT_AMBULATORY_CARE_PROVIDER_SITE_OTHER): Payer: Self-pay | Admitting: Family Medicine

## 2022-10-12 ENCOUNTER — Encounter: Payer: Self-pay | Admitting: Internal Medicine

## 2022-10-12 VITALS — BP 118/85 | HR 72 | Wt 288.3 lb

## 2022-10-12 DIAGNOSIS — M79675 Pain in left toe(s): Secondary | ICD-10-CM | POA: Diagnosis not present

## 2022-10-12 DIAGNOSIS — I10 Essential (primary) hypertension: Secondary | ICD-10-CM

## 2022-10-12 MED ORDER — SPIRONOLACTONE 25 MG PO TABS
25.0000 mg | ORAL_TABLET | Freq: Every day | ORAL | 1 refills | Status: DC
  Filled 2022-10-12: qty 30, 30d supply, fill #0
  Filled 2022-10-27 – 2022-11-14 (×2): qty 30, 30d supply, fill #1
  Filled 2022-12-09: qty 30, 30d supply, fill #2
  Filled 2023-01-17: qty 30, 30d supply, fill #3
  Filled 2023-02-17: qty 30, 30d supply, fill #4
  Filled 2023-03-27: qty 30, 30d supply, fill #5

## 2022-10-12 NOTE — Progress Notes (Signed)
Established Patient Office Visit     CC/Reason for Visit: Follow-up chronic conditions, discuss left toe pain  HPI: Danielle Shaw is a 52 y.o. female who is coming in today for the above mentioned reasons. Past Medical History is significant for: Hypertension, hyperlipidemia, impaired glucose tolerance, obstructive sleep apnea, morbid obesity.  He has been feeling well.  She just returned from a cruise.  She bumped her left third toe on a chair in the middle of the night and has been having pain and swelling in the area since.   Past Medical/Surgical History: Past Medical History:  Diagnosis Date   Arrhythmia    Arthritis    GERD (gastroesophageal reflux disease)    Hyperlipidemia    Hypertension    IUD 2004   IUD 2009   Migraine    Palpitations    Sleep apnea    SOB (shortness of breath)     Past Surgical History:  Procedure Laterality Date   BREAST BIOPSY Right 2011   BREAST SURGERY  2000   reduction   CESAREAN SECTION     COLONOSCOPY WITH PROPOFOL N/A 07/11/2018   Procedure: COLONOSCOPY WITH PROPOFOL;  Surgeon: Thornton Park, MD;  Location: [facility];  Service: Gastroenterology;  Laterality: N/A;   REDUCTION MAMMAPLASTY      Social History:  reports that she has never smoked. She has never used smokeless tobacco. She reports current alcohol use. She reports that she does not use drugs.  Allergies: Allergies  Allergen Reactions   Latex    Lisinopril Cough   Metoprolol Other (See Comments)    Funny feeling    Family History:  Family History  Problem Relation Age of Onset   Hyperlipidemia Mother    Hypertension Mother    Heart disease Mother 58   High Cholesterol Mother    High blood pressure Mother    Hyperlipidemia Father    Hypertension Father    Diabetes Father    Heart disease Father    High blood pressure Father    High Cholesterol Father    Heart disease Maternal Grandmother    Hodgkin's lymphoma Daughter      Current  Outpatient Medications:    albuterol (VENTOLIN HFA) 108 (90 Base) MCG/ACT inhaler, INHALE 2 PUFFS BY MOUTH EVERY 6 HOURS AS NEEDED FOR WHEEZING OR SHORTNESS OF BREATH, Disp: 6.7 g, Rfl: 1   Cholecalciferol 1.25 MG (50000 UT) capsule, Take 1 capsule (50,000 Units total) by mouth once a week., Disp: 4 capsule, Rfl: 0   cyanocobalamin (,VITAMIN B-12,) 1000 MCG/ML injection, Inject 1,000 mcg into the muscle once., Disp: , Rfl:    cyclobenzaprine (FLEXERIL) 5 MG tablet, Take 1 tablet (5 mg total) by mouth 3 (three) times daily as needed for muscle spasms., Disp: 30 tablet, Rfl: 1   gabapentin (NEURONTIN) 100 MG capsule, Take 100 mg by mouth 3 (three) times daily., Disp: , Rfl:    levocetirizine (XYZAL) 5 MG tablet, Take 1 tablet (5 mg total) by mouth every evening., Disp: 90 tablet, Rfl: 1   losartan (COZAAR) 100 MG tablet, Take 1 tablet (100 mg total) by mouth daily., Disp: 90 tablet, Rfl: 0   modafinil (PROVIGIL) 200 MG tablet, Take 1 tablet (200 mg total) by mouth daily., Disp: 30 tablet, Rfl: 5   omeprazole (PRILOSEC) 20 MG capsule, Take 20 mg by mouth daily., Disp: , Rfl:    pantoprazole (PROTONIX) 40 MG tablet, Take 1 tablet (40 mg total) by mouth daily., Disp:  90 tablet, Rfl: 3   rosuvastatin (CRESTOR) 20 MG tablet, Take 1 tablet (20 mg total) by mouth daily., Disp: 90 tablet, Rfl: 0   Sod Fluoride-Potassium Nitrate (PREVIDENT 5000 SENSITIVE) 1.1-5 % GEL, Use to brush teeth once daily in the evening., Disp: 100 mL, Rfl: 5   tirzepatide (MOUNJARO) 2.5 MG/0.5ML Pen, Inject 2.5 mg into the skin once a week., Disp: 2 mL, Rfl: 0   spironolactone (ALDACTONE) 25 MG tablet, Take 1 tablet (25 mg total) by mouth daily., Disp: 90 tablet, Rfl: 1  Review of Systems:  Negative unless indicated in HPI.   Physical Exam: Vitals:   10/12/22 1429 10/12/22 1430  BP: (!) 152/88 118/85  Pulse: 72   SpO2: 100%   Weight: 288 lb 4.8 oz (130.8 kg)     Body mass index is 47.98 kg/m.   Physical Exam Vitals  reviewed.  Constitutional:      Appearance: Normal appearance.  HENT:     Head: Normocephalic and atraumatic.  Eyes:     Conjunctiva/sclera: Conjunctivae normal.     Pupils: Pupils are equal, round, and reactive to light.  Cardiovascular:     Rate and Rhythm: Normal rate and regular rhythm.  Pulmonary:     Effort: Pulmonary effort is normal.     Breath sounds: Normal breath sounds.  Musculoskeletal:     Right foot: Normal.     Left foot: Swelling present.     Comments: Swelling and pain to palpation of the left third toe and metatarsal head.  Skin:    General: Skin is warm and dry.  Neurological:     General: No focal deficit present.     Mental Status: She is alert and oriented to person, place, and time.  Psychiatric:        Mood and Affect: Mood normal.        Behavior: Behavior normal.        Thought Content: Thought content normal.        Judgment: Judgment normal.      Impression and Plan:  Toe pain, left - Plan: DG Foot Complete Left  Essential hypertension - Plan: spironolactone (ALDACTONE) 25 MG tablet  -Blood pressure is fairly well-controlled. -X-ray of left foot.  Time spent:31 minutes reviewing chart, interviewing and examining patient and formulating plan of care.     Lelon Frohlich, MD [facility]

## 2022-10-14 NOTE — Progress Notes (Signed)
Chief Complaint:   OBESITY Danielle Shaw is here to discuss her progress with her obesity treatment plan along with follow-up of her obesity related diagnoses. Danielle Shaw is on the Category 3 Plan or the Category 4 Plan and states she is following her eating plan approximately 70% of the time. Danielle Shaw states she is dancing, walking 80 minutes.  Today's visit was #: 63 Starting weight: 312 lbs Starting date: 04/08/2021 Today's weight: 283 lbs Today's date: 10/06/2022 Total lbs lost to date: 29 lbs Total lbs lost since last in-office visit: +11 lbs  Interim History:  Patient returned to clinic for first time since 08/17/22.  Went to [place] and on a cruise to [place] and [place].  Was supposed to go to [place] but wind was too intense.  Since last appointment she was not really monitoring food choices or quantity.  Vacation is over and she returns to work next Tuesday.  She has been going between category 3 and category 4.  Plans to go grocery shopping tomorrow. No other obstacles or plans anticipated.  Having a masquerade sneaker ball at the end of the month in celebration of her birthday and in honor of her mother.    Subjective:   1. Other hyperlipidemia Patient last LDL 109, HDL 60, triglycerides 55.  No side effects noted on Crestor.  2. Essential hypertension Blood pressure controlled today.  Patient denies chest pain, chest pressure, headache.  3. Vitamin D deficiency Patient is on prescription vitamin D weekly.  Last vitamin D level was 40.  Assessment/Plan:   1. Other hyperlipidemia Refill Crestor 20 mg daily #90, 0 refill.  2. Essential hypertension Continue and refill.  Refill- losartan (COZAAR) 100 MG tablet; Take 1 tablet (100 mg total) by mouth daily.  Dispense: 90 tablet; Refill: 0  Refill- rosuvastatin (CRESTOR) 20 MG tablet; Take 1 tablet (20 mg total) by mouth daily.  Dispense: 90 tablet; Refill: 0  3. Vitamin D deficiency Refill- Cholecalciferol 1.25 MG (50000 UT)  capsule; Take 1 capsule (50,000 Units total) by mouth once a week.  Dispense: 4 capsule; Refill: 0  4. BMI 45.0-49.9, adult ([place])  5. Obesity with starting BMI of 55.7 Danielle Shaw is currently in the action stage of change. As such, her goal is to continue with weight loss efforts. She has agreed to the Category 4 Plan.   Exercise goals: All adults should avoid inactivity. Some physical activity is better than none, and adults who participate in any amount of physical activity gain some health benefits.  Behavioral modification strategies: increasing lean protein intake, meal planning and cooking strategies, keeping healthy foods in the home, and planning for success.  Danielle Shaw has agreed to follow-up with our clinic in 4 weeks. She was informed of the importance of frequent follow-up visits to maximize her success with intensive lifestyle modifications for her multiple health conditions.   Objective:   Blood pressure 138/89, pulse 73, temperature 98.6 F (37 C), height 5\' 5"  (1.651 m), weight 283 lb (128.4 kg), SpO2 99 %. Body mass index is 47.09 kg/m.  General: Cooperative, alert, well developed, in no acute distress. HEENT: Conjunctivae and lids unremarkable. Cardiovascular: Regular rhythm.  Lungs: Normal work of breathing. Neurologic: No focal deficits.   Lab Results  Component Value Date   CREATININE 0.75 06/23/2022   BUN 19 06/23/2022   NA 142 06/23/2022   K 4.6 06/23/2022   CL 103 06/23/2022   CO2 25 06/23/2022   Lab Results  Component Value Date  ALT 22 06/23/2022   AST 21 06/23/2022   ALKPHOS 109 06/23/2022   BILITOT 0.4 06/23/2022   Lab Results  Component Value Date   HGBA1C 5.6 06/23/2022   HGBA1C 5.4 12/24/2021   HGBA1C 5.6 09/15/2021   HGBA1C 6.3 (H) 04/08/2021   HGBA1C 6.0 (H) 09/27/2018   Lab Results  Component Value Date   INSULIN 25.3 (H) 06/23/2022   INSULIN 14.4 09/15/2021   INSULIN 20.3 04/08/2021   INSULIN 22.0 09/27/2018   INSULIN 16.3 03/17/2018    Lab Results  Component Value Date   TSH 1.11 12/24/2021   Lab Results  Component Value Date   CHOL 180 06/23/2022   HDL 60 06/23/2022   LDLCALC 109 (H) 06/23/2022   LDLDIRECT 177 (H) 10/28/2010   TRIG 55 06/23/2022   CHOLHDL 5 12/24/2021   Lab Results  Component Value Date   VD25OH 40.0 06/23/2022   VD25OH 25.52 (L) 12/24/2021   VD25OH 39.4 09/15/2021   Lab Results  Component Value Date   WBC 3.9 (L) 12/24/2021   HGB 12.6 12/24/2021   HCT 36.9 12/24/2021   MCV 86.1 12/24/2021   PLT 303.0 12/24/2021   No results found for: "IRON", "TIBC", "FERRITIN"  Attestation Statements:   Reviewed by clinician on day of visit: allergies, medications, problem list, medical history, surgical history, family history, social history, and previous encounter notes.  I, Davy Pique, RMA, am acting as transcriptionist for Coralie Common, MD.  I have reviewed the above documentation for accuracy and completeness, and I agree with the above. - Coralie Common, MD

## 2022-10-15 ENCOUNTER — Other Ambulatory Visit: Payer: Self-pay | Admitting: *Deleted

## 2022-10-15 DIAGNOSIS — Z8781 Personal history of (healed) traumatic fracture: Secondary | ICD-10-CM

## 2022-10-27 ENCOUNTER — Other Ambulatory Visit: Payer: Self-pay

## 2022-10-27 ENCOUNTER — Other Ambulatory Visit (INDEPENDENT_AMBULATORY_CARE_PROVIDER_SITE_OTHER): Payer: 59

## 2022-10-27 ENCOUNTER — Ambulatory Visit (INDEPENDENT_AMBULATORY_CARE_PROVIDER_SITE_OTHER): Payer: 59 | Admitting: Family

## 2022-10-27 ENCOUNTER — Encounter: Payer: Self-pay | Admitting: Family

## 2022-10-27 ENCOUNTER — Other Ambulatory Visit (INDEPENDENT_AMBULATORY_CARE_PROVIDER_SITE_OTHER): Payer: Self-pay | Admitting: Family Medicine

## 2022-10-27 DIAGNOSIS — E559 Vitamin D deficiency, unspecified: Secondary | ICD-10-CM

## 2022-10-27 DIAGNOSIS — M79675 Pain in left toe(s): Secondary | ICD-10-CM | POA: Diagnosis not present

## 2022-10-27 NOTE — Progress Notes (Signed)
Office Visit Note   Patient: Danielle Shaw           Date of Birth: 04/17/1971           MRN: [id number] Visit Date: 10/27/2022              Requested by: Isaac Bliss, Rayford Halsted, MD [place],  [place] [id number] PCP: Isaac Bliss, Rayford Halsted, MD  Chief Complaint  Patient presents with   Left Foot - Pain    3rd toe pain stubbed toe on chair       HPI: The patient is a 52 year old woman who is seen for evaluation of the third toe fracture.  She stubbed it on a chair several weeks ago.  She believes it has been about 4 weeks.  She did have radiographs performed by her primary care provider on March 18 which showed proximal phalanx fracture of the third toe  She has been in regular shoewear continues to have pain and difficulty with weightbearing  Assessment & Plan: Visit Diagnoses:  1. Pain in toe of left foot     Plan: Given a postop shoe she will use this for the next 2 to 4 weeks can discontinue to her comfort.  She will follow-up in the office if she fails to improve as expected  Follow-Up Instructions: No follow-ups on file.   Ortho Exam  Patient is alert, oriented, no adenopathy, well-dressed, normal affect, normal respiratory effort. On examination of the left foot she does have a palpable dorsalis pedis pulse There is no edema no erythema the third toe is anatomic in alignment.  Tenderness pain with flexion  Imaging: No results found. No images are attached to the encounter.  Labs: Lab Results  Component Value Date   HGBA1C 5.6 06/23/2022   HGBA1C 5.4 12/24/2021   HGBA1C 5.6 09/15/2021   REPTSTATUS 12/16/2017 FINAL 12/14/2017   CULT MULTIPLE SPECIES PRESENT, SUGGEST RECOLLECTION (A) 12/14/2017     Lab Results  Component Value Date   ALBUMIN 4.5 06/23/2022   ALBUMIN 4.3 12/24/2021   ALBUMIN 4.8 09/15/2021    No results found for: "MG" Lab Results  Component Value Date   VD25OH 40.0 06/23/2022   VD25OH 25.52 (L) 12/24/2021    VD25OH 39.4 09/15/2021    No results found for: "PREALBUMIN"    Latest Ref Rng & Units 12/24/2021   10:46 AM 04/08/2021    9:29 AM 05/19/2018   11:36 AM  CBC EXTENDED  WBC 4.0 - 10.5 K/uL 3.9  5.2  4.7   RBC 3.87 - 5.11 Mil/uL 4.28  4.67  4.43   Hemoglobin 12.0 - 15.0 g/dL 12.6  13.4  12.9   HCT 36.0 - 46.0 % 36.9  41.6  38.2   Platelets 150.0 - 400.0 K/uL 303.0  336  347.0   NEUT# 1.4 - 7.7 K/uL 1.5  2.8    Lymph# 0.7 - 4.0 K/uL 1.8  2.0       There is no height or weight on file to calculate BMI.  Orders:  Orders Placed This Encounter  Procedures   XR Toe 3rd Left   No orders of the defined types were placed in this encounter.    Procedures: No procedures performed  Clinical Data: No additional findings.  ROS:  All other systems negative, except as noted in the HPI. Review of Systems  Objective: Vital Signs: There were no vitals taken for this visit.  Specialty Comments:  No specialty comments  available.  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 07/07/2021   Severe obstructive sleep apnea-hypopnea syndrome 07/07/2021   Excessive daytime sleepiness 07/07/2021   Hypersomnolence disorder, subacute, severe, with another sleep disorder 07/07/2021   Sleep disorder, shift-work 07/07/2021   Neck pain on left side 05/19/2018   Blood in stool 05/19/2018   Circadian rhythm sleep disorder, shift work type 03/30/2018   Obstructive sleep apnea hypopnea, severe 03/30/2018   Carpal tunnel syndrome on both sides 03/30/2018   SI (sacroiliac) joint dysfunction 01/17/2018   Left flank pain 01/07/2018   Sleep paralysis, recurrent isolated 10/20/2017   Complaint related to dreams 10/20/2017   Arthralgia 12/26/2016   Allergic rhinitis 05/03/2015   Right knee pain 04/27/2013   Annual physical exam 07/15/2012   Presence of intrauterine contraceptive device  (IUD)    Morbid obesity ([place]) 03/07/2007   Hyperlipidemia 09/23/2006   MIGRAINE, UNSPEC., W/O INTRACTABLE MIGRAINE 09/23/2006   HYPERTENSION, BENIGN SYSTEMIC 09/23/2006   Past Medical History:  Diagnosis Date   Arrhythmia    Arthritis    GERD (gastroesophageal reflux disease)    Hyperlipidemia    Hypertension    IUD 2004   IUD 2009   Migraine    Palpitations    Sleep apnea    SOB (shortness of breath)     Family History  Problem Relation Age of Onset   Hyperlipidemia Mother    Hypertension Mother    Heart disease Mother 46   High Cholesterol Mother    High blood pressure Mother    Hyperlipidemia Father    Hypertension Father    Diabetes Father    Heart disease Father    High blood pressure Father    High Cholesterol Father    Heart disease Maternal Grandmother    Hodgkin's lymphoma Daughter     Past Surgical History:  Procedure Laterality Date   BREAST BIOPSY Right 2011   BREAST SURGERY  2000   reduction   CESAREAN SECTION     COLONOSCOPY WITH PROPOFOL N/A 07/11/2018   Procedure: COLONOSCOPY WITH PROPOFOL;  Surgeon: Thornton Park, MD;  Location: [facility];  Service: Gastroenterology;  Laterality: N/A;   REDUCTION MAMMAPLASTY     Social History   Occupational History   Occupation: [name], academic   Occupation: [employer]  Tobacco Use   Smoking status: Never   Smokeless tobacco: Never  Vaping Use   Vaping Use: Never used  Substance and Sexual Activity   Alcohol use: Yes    Comment: ocass   Drug use: No   Sexual activity: Yes    Partners: Male

## 2022-11-03 ENCOUNTER — Ambulatory Visit (INDEPENDENT_AMBULATORY_CARE_PROVIDER_SITE_OTHER): Payer: 59 | Admitting: Family Medicine

## 2022-11-14 ENCOUNTER — Other Ambulatory Visit (HOSPITAL_BASED_OUTPATIENT_CLINIC_OR_DEPARTMENT_OTHER): Payer: Self-pay

## 2022-11-16 ENCOUNTER — Other Ambulatory Visit (HOSPITAL_COMMUNITY): Payer: Self-pay

## 2022-12-09 ENCOUNTER — Other Ambulatory Visit (INDEPENDENT_AMBULATORY_CARE_PROVIDER_SITE_OTHER): Payer: Self-pay | Admitting: Family Medicine

## 2022-12-09 ENCOUNTER — Other Ambulatory Visit: Payer: Self-pay

## 2022-12-09 ENCOUNTER — Other Ambulatory Visit (HOSPITAL_BASED_OUTPATIENT_CLINIC_OR_DEPARTMENT_OTHER): Payer: Self-pay

## 2022-12-09 DIAGNOSIS — E559 Vitamin D deficiency, unspecified: Secondary | ICD-10-CM

## 2022-12-10 ENCOUNTER — Encounter (HOSPITAL_BASED_OUTPATIENT_CLINIC_OR_DEPARTMENT_OTHER): Payer: Self-pay | Admitting: Pharmacist

## 2022-12-10 ENCOUNTER — Other Ambulatory Visit (HOSPITAL_BASED_OUTPATIENT_CLINIC_OR_DEPARTMENT_OTHER): Payer: Self-pay

## 2023-01-17 ENCOUNTER — Other Ambulatory Visit (INDEPENDENT_AMBULATORY_CARE_PROVIDER_SITE_OTHER): Payer: Self-pay | Admitting: Family Medicine

## 2023-01-17 DIAGNOSIS — I10 Essential (primary) hypertension: Secondary | ICD-10-CM

## 2023-01-18 ENCOUNTER — Other Ambulatory Visit (HOSPITAL_BASED_OUTPATIENT_CLINIC_OR_DEPARTMENT_OTHER): Payer: Self-pay

## 2023-01-18 ENCOUNTER — Encounter (HOSPITAL_BASED_OUTPATIENT_CLINIC_OR_DEPARTMENT_OTHER): Payer: Self-pay | Admitting: Pharmacist

## 2023-01-18 ENCOUNTER — Other Ambulatory Visit: Payer: Self-pay

## 2023-01-21 ENCOUNTER — Other Ambulatory Visit (INDEPENDENT_AMBULATORY_CARE_PROVIDER_SITE_OTHER): Payer: Self-pay | Admitting: Internal Medicine

## 2023-01-21 ENCOUNTER — Other Ambulatory Visit (HOSPITAL_BASED_OUTPATIENT_CLINIC_OR_DEPARTMENT_OTHER): Payer: Self-pay

## 2023-01-21 DIAGNOSIS — I10 Essential (primary) hypertension: Secondary | ICD-10-CM

## 2023-01-21 MED ORDER — ROSUVASTATIN CALCIUM 20 MG PO TABS
20.0000 mg | ORAL_TABLET | Freq: Every day | ORAL | 0 refills | Status: DC
  Filled 2023-01-21: qty 30, 30d supply, fill #0
  Filled 2023-02-17: qty 30, 30d supply, fill #1
  Filled 2023-03-27: qty 30, 30d supply, fill #2

## 2023-01-21 MED ORDER — LOSARTAN POTASSIUM 100 MG PO TABS
100.0000 mg | ORAL_TABLET | Freq: Every day | ORAL | 0 refills | Status: DC
  Filled 2023-01-21: qty 30, 30d supply, fill #0
  Filled 2023-02-17: qty 30, 30d supply, fill #1
  Filled 2023-03-27: qty 30, 30d supply, fill #2

## 2023-02-15 ENCOUNTER — Ambulatory Visit (INDEPENDENT_AMBULATORY_CARE_PROVIDER_SITE_OTHER): Payer: 59 | Admitting: Family Medicine

## 2023-02-16 ENCOUNTER — Encounter (INDEPENDENT_AMBULATORY_CARE_PROVIDER_SITE_OTHER): Payer: Self-pay | Admitting: Family Medicine

## 2023-02-16 ENCOUNTER — Other Ambulatory Visit (HOSPITAL_BASED_OUTPATIENT_CLINIC_OR_DEPARTMENT_OTHER): Payer: Self-pay

## 2023-02-16 ENCOUNTER — Ambulatory Visit (INDEPENDENT_AMBULATORY_CARE_PROVIDER_SITE_OTHER): Payer: 59 | Admitting: Family Medicine

## 2023-02-16 VITALS — BP 129/84 | HR 76 | Temp 98.7°F | Ht 65.0 in | Wt 308.0 lb

## 2023-02-16 DIAGNOSIS — Z6841 Body Mass Index (BMI) 40.0 and over, adult: Secondary | ICD-10-CM

## 2023-02-16 DIAGNOSIS — R7303 Prediabetes: Secondary | ICD-10-CM

## 2023-02-16 DIAGNOSIS — E559 Vitamin D deficiency, unspecified: Secondary | ICD-10-CM | POA: Diagnosis not present

## 2023-02-16 DIAGNOSIS — E7849 Other hyperlipidemia: Secondary | ICD-10-CM | POA: Diagnosis not present

## 2023-02-16 DIAGNOSIS — E669 Obesity, unspecified: Secondary | ICD-10-CM

## 2023-02-16 MED ORDER — CHOLECALCIFEROL 1.25 MG (50000 UT) PO CAPS
50000.0000 [IU] | ORAL_CAPSULE | ORAL | 0 refills | Status: AC
Start: 2023-02-16 — End: ?
  Filled 2023-02-16: qty 4, 28d supply, fill #0

## 2023-02-16 NOTE — Progress Notes (Unsigned)
Chief Complaint:   OBESITY Danielle Shaw is here to discuss her progress with her obesity treatment plan along with follow-up of her obesity related diagnoses. Danielle Shaw is on the Category 4 Plan and states she is following her eating plan approximately 50% of the time. Danielle Shaw states she is walking and doing pool exercise for 30-45 minutes 4 times per week.  Today's visit was #: 19 Starting weight: 312 lbs Starting date: 04/08/2021 Today's weight: 308 lbs Today's date: 02/16/2023 Total lbs lost to date: 4 Total lbs lost since last in-office visit: 0  Interim History: Patient has been mostly working since last appointment.  She feels like she lost focus because her job has really stressed her out.  Her office is now fully staffed and she is ready to get back on track.  Her summer has been good so far. No planned vacation or travel. Goal is to go to grocery store tomorrow to restock food for Category 4.  Subjective:   1. Vitamin D deficiency Patient was previously on vitamin D.  2. Prediabetes Patient's last A1c was better controlled at 5.6, but she is on Mounjaro.  Her insurance no longer is covering Mounjaro.  3. Other hyperlipidemia Patient's last LDL was 109, HDL 60, and triglycerides 55.  She is on Crestor.  Assessment/Plan:   1. Vitamin D deficiency We will check labs today, and we will refill prescription vitamin D 50,000 IU once weekly for 1 month.  - Cholecalciferol 1.25 MG (50000 UT) capsule; Take 1 capsule (50,000 Units total) by mouth once a week.  Dispense: 4 capsule; Refill: 0 - VITAMIN D 25 Hydroxy (Vit-D Deficiency, Fractures)  2. Prediabetes We will check labs today, we will follow-up at patient's next appointment.  - Comprehensive metabolic panel - Hemoglobin A1c - Insulin, random  3. Other hyperlipidemia We will check labs today, and we will follow-up at patient's next appointment.  - Lipid Panel With LDL/HDL Ratio  4. BMI 50.0-59.9, adult (HCC)  5. Obesity  with starting BMI of 55.7 Danielle Shaw is currently in the action stage of change. As such, her goal is to continue with weight loss efforts. She has agreed to the Category 4 Plan.   Exercise goals: All adults should avoid inactivity. Some physical activity is better than none, and adults who participate in any amount of physical activity gain some health benefits.  Behavioral modification strategies: increasing lean protein intake, meal planning and cooking strategies, keeping healthy foods in the home, and planning for success.  Danielle Shaw has agreed to follow-up with our clinic in 4 weeks. She was informed of the importance of frequent follow-up visits to maximize her success with intensive lifestyle modifications for her multiple health conditions.   Danielle Shaw was informed we would discuss her lab results at her next visit unless there is a critical issue that needs to be addressed sooner. Danielle Shaw agreed to keep her next visit at the agreed upon time to discuss these results.  Objective:   Blood pressure 129/84, pulse 76, temperature 98.7 F (37.1 C), height 5\' 5"  (1.651 m), weight (!) 308 lb (139.7 kg), SpO2 96%. Body mass index is 51.25 kg/m.  General: Cooperative, alert, well developed, in no acute distress. HEENT: Conjunctivae and lids unremarkable. Cardiovascular: Regular rhythm.  Lungs: Normal work of breathing. Neurologic: No focal deficits.   Lab Results  Component Value Date   CREATININE 0.75 02/16/2023   BUN 12 02/16/2023   NA 142 02/16/2023   K 4.0 02/16/2023   CL  103 02/16/2023   CO2 25 02/16/2023   Lab Results  Component Value Date   ALT 21 02/16/2023   AST 24 02/16/2023   ALKPHOS 128 (H) 02/16/2023   BILITOT 0.5 02/16/2023   Lab Results  Component Value Date   HGBA1C 6.1 (H) 02/16/2023   HGBA1C 5.6 06/23/2022   HGBA1C 5.4 12/24/2021   HGBA1C 5.6 09/15/2021   HGBA1C 6.3 (H) 04/08/2021   Lab Results  Component Value Date   INSULIN 19.7 02/16/2023   INSULIN 25.3 (H)  06/23/2022   INSULIN 14.4 09/15/2021   INSULIN 20.3 04/08/2021   INSULIN 22.0 09/27/2018   Lab Results  Component Value Date   TSH 1.11 12/24/2021   Lab Results  Component Value Date   CHOL 170 02/16/2023   HDL 53 02/16/2023   LDLCALC 97 02/16/2023   LDLDIRECT 177 (H) 10/28/2010   TRIG 112 02/16/2023   CHOLHDL 5 12/24/2021   Lab Results  Component Value Date   VD25OH 32.0 02/16/2023   VD25OH 40.0 06/23/2022   VD25OH 25.52 (L) 12/24/2021   Lab Results  Component Value Date   WBC 3.9 (L) 12/24/2021   HGB 12.6 12/24/2021   HCT 36.9 12/24/2021   MCV 86.1 12/24/2021   PLT 303.0 12/24/2021   No results found for: "IRON", "TIBC", "FERRITIN"  Attestation Statements:   Reviewed by clinician on day of visit: allergies, medications, problem list, medical history, surgical history, family history, social history, and previous encounter notes.   I, Burt Knack, am acting as transcriptionist for Reuben Likes, MD.  I have reviewed the above documentation for accuracy and completeness, and I agree with the above. - Reuben Likes, MD

## 2023-02-17 LAB — HEMOGLOBIN A1C
Est. average glucose Bld gHb Est-mCnc: 128 mg/dL
Hgb A1c MFr Bld: 6.1 % — ABNORMAL HIGH (ref 4.8–5.6)

## 2023-02-17 LAB — COMPREHENSIVE METABOLIC PANEL
ALT: 21 IU/L (ref 0–32)
AST: 24 IU/L (ref 0–40)
Albumin: 4.5 g/dL (ref 3.9–4.9)
Alkaline Phosphatase: 128 IU/L — ABNORMAL HIGH (ref 44–121)
BUN/Creatinine Ratio: 16 (ref 9–23)
BUN: 12 mg/dL (ref 6–24)
Bilirubin Total: 0.5 mg/dL (ref 0.0–1.2)
CO2: 25 mmol/L (ref 20–29)
Calcium: 9.7 mg/dL (ref 8.7–10.2)
Chloride: 103 mmol/L (ref 96–106)
Creatinine, Ser: 0.75 mg/dL (ref 0.57–1.00)
Globulin, Total: 3.1 g/dL (ref 1.5–4.5)
Glucose: 87 mg/dL (ref 70–99)
Potassium: 4 mmol/L (ref 3.5–5.2)
Sodium: 142 mmol/L (ref 134–144)
Total Protein: 7.6 g/dL (ref 6.0–8.5)
eGFR: 97 mL/min/{1.73_m2} (ref >59)

## 2023-02-17 LAB — INSULIN, RANDOM: INSULIN: 19.7 u[IU]/mL (ref 2.6–24.9)

## 2023-02-17 LAB — LIPID PANEL WITH LDL/HDL RATIO
Cholesterol, Total: 170 mg/dL (ref 100–199)
HDL: 53 mg/dL (ref >39)
LDL Chol Calc (NIH): 97 mg/dL (ref 0–99)
LDL/HDL Ratio: 1.8 ratio (ref 0.0–3.2)
Triglycerides: 112 mg/dL (ref 0–149)
VLDL Cholesterol Cal: 20 mg/dL (ref 5–40)

## 2023-02-17 LAB — VITAMIN D 25 HYDROXY (VIT D DEFICIENCY, FRACTURES): Vit D, 25-Hydroxy: 32 ng/mL (ref 30.0–100.0)

## 2023-03-10 ENCOUNTER — Other Ambulatory Visit (HOSPITAL_BASED_OUTPATIENT_CLINIC_OR_DEPARTMENT_OTHER): Payer: Self-pay

## 2023-03-10 MED ORDER — AMOXICILLIN 500 MG PO CAPS
500.0000 mg | ORAL_CAPSULE | Freq: Three times a day (TID) | ORAL | 0 refills | Status: AC
  Filled 2023-03-10: qty 21, 7d supply, fill #0

## 2023-03-17 ENCOUNTER — Ambulatory Visit (INDEPENDENT_AMBULATORY_CARE_PROVIDER_SITE_OTHER): Payer: 59 | Admitting: Family Medicine

## 2023-03-27 ENCOUNTER — Other Ambulatory Visit: Payer: Self-pay | Admitting: Internal Medicine

## 2023-03-27 DIAGNOSIS — Z79891 Long term (current) use of opiate analgesic: Secondary | ICD-10-CM

## 2023-03-30 ENCOUNTER — Other Ambulatory Visit (HOSPITAL_BASED_OUTPATIENT_CLINIC_OR_DEPARTMENT_OTHER): Payer: Self-pay

## 2023-03-30 ENCOUNTER — Other Ambulatory Visit: Payer: Self-pay

## 2023-03-30 MED ORDER — CLINDAMYCIN HCL 150 MG PO CAPS
150.0000 mg | ORAL_CAPSULE | Freq: Three times a day (TID) | ORAL | 0 refills | Status: AC
  Filled 2023-03-30: qty 21, 7d supply, fill #0

## 2023-03-30 MED ORDER — ALBUTEROL SULFATE HFA 108 (90 BASE) MCG/ACT IN AERS
INHALATION_SPRAY | RESPIRATORY_TRACT | 1 refills | Status: AC
Start: 2023-03-30 — End: ?
  Filled 2023-03-30: qty 6.7, 25d supply, fill #0
  Filled 2023-07-11: qty 6.7, 25d supply, fill #1

## 2023-03-30 MED ORDER — LEVOCETIRIZINE DIHYDROCHLORIDE 5 MG PO TABS
5.0000 mg | ORAL_TABLET | Freq: Every evening | ORAL | 1 refills | Status: DC
Start: 2023-03-30 — End: 2024-04-15
  Filled 2023-03-30: qty 30, 30d supply, fill #0
  Filled 2023-06-09: qty 30, 30d supply, fill #1
  Filled 2023-07-11: qty 30, 30d supply, fill #2
  Filled 2023-11-10: qty 30, 30d supply, fill #3

## 2023-03-31 ENCOUNTER — Other Ambulatory Visit: Payer: Self-pay

## 2023-04-14 ENCOUNTER — Other Ambulatory Visit (HOSPITAL_BASED_OUTPATIENT_CLINIC_OR_DEPARTMENT_OTHER): Payer: Self-pay

## 2023-04-20 ENCOUNTER — Ambulatory Visit (INDEPENDENT_AMBULATORY_CARE_PROVIDER_SITE_OTHER): Payer: 59 | Admitting: Family Medicine

## 2023-04-20 ENCOUNTER — Other Ambulatory Visit (HOSPITAL_BASED_OUTPATIENT_CLINIC_OR_DEPARTMENT_OTHER): Payer: Self-pay

## 2023-05-05 ENCOUNTER — Telehealth: Payer: Self-pay | Admitting: Internal Medicine

## 2023-05-05 NOTE — Telephone Encounter (Addendum)
Triage Nurse called to inform MD Pt is refusing to go to ED.  Ran Covid Panel:  Pt has diarrhea, nausea, no appetite, headache, sinus drainage, fever, congestion, chest pain when coughing, etc.  Please call Pt to discuss.

## 2023-05-05 NOTE — Telephone Encounter (Signed)
Pt is calling and she will be coming from [place] and pt is aware someone will call her back

## 2023-05-05 NOTE — Telephone Encounter (Signed)
Spoke with the patient and informed her of the message below.  Patient stated she tested negative for Covid and was seen at an urgent care.

## 2023-05-12 ENCOUNTER — Other Ambulatory Visit: Payer: Self-pay | Admitting: Internal Medicine

## 2023-05-12 ENCOUNTER — Other Ambulatory Visit: Payer: Self-pay

## 2023-05-12 ENCOUNTER — Other Ambulatory Visit (HOSPITAL_BASED_OUTPATIENT_CLINIC_OR_DEPARTMENT_OTHER): Payer: Self-pay

## 2023-05-12 ENCOUNTER — Other Ambulatory Visit (INDEPENDENT_AMBULATORY_CARE_PROVIDER_SITE_OTHER): Payer: Self-pay | Admitting: Internal Medicine

## 2023-05-12 DIAGNOSIS — I10 Essential (primary) hypertension: Secondary | ICD-10-CM

## 2023-05-12 MED ORDER — ROSUVASTATIN CALCIUM 20 MG PO TABS
20.0000 mg | ORAL_TABLET | Freq: Every day | ORAL | 0 refills | Status: DC
Start: 2023-05-12 — End: 2023-08-18
  Filled 2023-05-12: qty 30, 30d supply, fill #0
  Filled 2023-06-09: qty 30, 30d supply, fill #1
  Filled 2023-07-11: qty 30, 30d supply, fill #2

## 2023-05-12 MED ORDER — LOSARTAN POTASSIUM 100 MG PO TABS
100.0000 mg | ORAL_TABLET | Freq: Every day | ORAL | 0 refills | Status: DC
Start: 2023-05-12 — End: 2023-08-18
  Filled 2023-05-12: qty 30, 30d supply, fill #0
  Filled 2023-06-09: qty 30, 30d supply, fill #1
  Filled 2023-07-11: qty 30, 30d supply, fill #2

## 2023-05-12 MED ORDER — SPIRONOLACTONE 25 MG PO TABS
25.0000 mg | ORAL_TABLET | Freq: Every day | ORAL | 1 refills | Status: DC
Start: 2023-05-12 — End: 2023-11-10
  Filled 2023-05-12: qty 30, 30d supply, fill #0
  Filled 2023-06-09: qty 30, 30d supply, fill #1
  Filled 2023-07-11: qty 30, 30d supply, fill #2
  Filled 2023-08-18: qty 30, 30d supply, fill #3
  Filled 2023-09-13: qty 30, 30d supply, fill #4
  Filled 2023-10-13: qty 30, 30d supply, fill #5

## 2023-05-31 ENCOUNTER — Encounter: Payer: 59 | Admitting: Internal Medicine

## 2023-05-31 ENCOUNTER — Telehealth: Payer: Self-pay | Admitting: Internal Medicine

## 2023-05-31 DIAGNOSIS — E7849 Other hyperlipidemia: Secondary | ICD-10-CM

## 2023-05-31 DIAGNOSIS — E559 Vitamin D deficiency, unspecified: Secondary | ICD-10-CM

## 2023-05-31 DIAGNOSIS — I10 Essential (primary) hypertension: Secondary | ICD-10-CM

## 2023-05-31 DIAGNOSIS — R7303 Prediabetes: Secondary | ICD-10-CM

## 2023-06-08 ENCOUNTER — Encounter: Payer: Self-pay | Admitting: Internal Medicine

## 2023-06-08 ENCOUNTER — Ambulatory Visit (INDEPENDENT_AMBULATORY_CARE_PROVIDER_SITE_OTHER): Payer: 59 | Admitting: Internal Medicine

## 2023-06-08 VITALS — BP 120/88 | HR 73 | Temp 98.7°F | Ht 65.5 in | Wt 332.3 lb

## 2023-06-08 DIAGNOSIS — G4733 Obstructive sleep apnea (adult) (pediatric): Secondary | ICD-10-CM

## 2023-06-08 DIAGNOSIS — E7849 Other hyperlipidemia: Secondary | ICD-10-CM

## 2023-06-08 DIAGNOSIS — R7303 Prediabetes: Secondary | ICD-10-CM

## 2023-06-08 DIAGNOSIS — Z0000 Encounter for general adult medical examination without abnormal findings: Secondary | ICD-10-CM

## 2023-06-08 DIAGNOSIS — E559 Vitamin D deficiency, unspecified: Secondary | ICD-10-CM | POA: Diagnosis not present

## 2023-06-08 DIAGNOSIS — Z23 Encounter for immunization: Secondary | ICD-10-CM

## 2023-06-08 DIAGNOSIS — Z1231 Encounter for screening mammogram for malignant neoplasm of breast: Secondary | ICD-10-CM

## 2023-06-08 DIAGNOSIS — Z1159 Encounter for screening for other viral diseases: Secondary | ICD-10-CM

## 2023-06-08 LAB — LIPID PANEL
Cholesterol: 156 mg/dL (ref 0–200)
HDL: 44.1 mg/dL (ref >39.00)
LDL Cholesterol: 87 mg/dL (ref 0–99)
NonHDL: 111.58
Total CHOL/HDL Ratio: 4
Triglycerides: 123 mg/dL (ref 0.0–149.0)
VLDL: 24.6 mg/dL (ref 0.0–40.0)

## 2023-06-08 LAB — HEMOGLOBIN A1C: Hgb A1c MFr Bld: 6.3 % (ref 4.6–6.5)

## 2023-06-08 LAB — TSH: TSH: 1.08 u[IU]/mL (ref 0.35–5.50)

## 2023-06-08 NOTE — Progress Notes (Signed)
Established Patient Office Visit     CC/Reason for Visit: Annual preventive exam  HPI: Danielle Shaw is a 52 y.o. female who is coming in today for the above mentioned reasons. Past Medical History is significant for: Hypertension, hyperlipidemia, impaired glucose tolerance, obstructive sleep apnea morbid obesity.  Feeling well without concerns or complaints.  Has routine eye and dental care.  Due for COVID, flu, shingles vaccines.  Due for mammogram.  Colonoscopy and Pap smear are up-to-date.   Past Medical/Surgical History: Past Medical History:  Diagnosis Date   Arrhythmia    Arthritis    GERD (gastroesophageal reflux disease)    Hyperlipidemia    Hypertension    IUD 2004   IUD 2009   Migraine    Palpitations    Sleep apnea    SOB (shortness of breath)     Past Surgical History:  Procedure Laterality Date   BREAST BIOPSY Right 2011   BREAST SURGERY  2000   reduction   CESAREAN SECTION     COLONOSCOPY WITH PROPOFOL N/A 07/11/2018   Procedure: COLONOSCOPY WITH PROPOFOL;  Surgeon: Tressia Danas, MD;  Location: [facility];  Service: Gastroenterology;  Laterality: N/A;   REDUCTION MAMMAPLASTY      Social History:  reports that she has never smoked. She has never used smokeless tobacco. She reports current alcohol use. She reports that she does not use drugs.  Allergies: Allergies  Allergen Reactions   Latex    Lisinopril Cough   Metoprolol Other (See Comments)    Funny feeling    Family History:  Family History  Problem Relation Age of Onset   Hyperlipidemia Mother    Hypertension Mother    Heart disease Mother 28   High Cholesterol Mother    High blood pressure Mother    Hyperlipidemia Father    Hypertension Father    Diabetes Father    Heart disease Father    High blood pressure Father    High Cholesterol Father    Heart disease Maternal Grandmother    Hodgkin's lymphoma Daughter      Current Outpatient Medications:    albuterol  (VENTOLIN HFA) 108 (90 Base) MCG/ACT inhaler, INHALE 2 PUFFS BY MOUTH EVERY 6 HOURS AS NEEDED FOR WHEEZING OR SHORTNESS OF BREATH, Disp: 6.7 g, Rfl: 1   amoxicillin (AMOXIL) 500 MG capsule, Take 1 capsule (500 mg total) by mouth 3 (three) times daily until gone., Disp: 21 capsule, Rfl: 0   Cholecalciferol 1.25 MG (50000 UT) capsule, Take 1 capsule (50,000 Units total) by mouth once a week., Disp: 4 capsule, Rfl: 0   clindamycin (CLEOCIN) 150 MG capsule, Take 1 capsule (150 mg total) by mouth 3 (three) times daily until gone., Disp: 21 capsule, Rfl: 0   cyanocobalamin (,VITAMIN B-12,) 1000 MCG/ML injection, Inject 1,000 mcg into the muscle once., Disp: , Rfl:    cyclobenzaprine (FLEXERIL) 5 MG tablet, Take 1 tablet (5 mg total) by mouth 3 (three) times daily as needed for muscle spasms., Disp: 30 tablet, Rfl: 1   gabapentin (NEURONTIN) 100 MG capsule, Take 100 mg by mouth 3 (three) times daily., Disp: , Rfl:    levocetirizine (XYZAL) 5 MG tablet, Take 1 tablet (5 mg total) by mouth every evening., Disp: 90 tablet, Rfl: 1   losartan (COZAAR) 100 MG tablet, Take 1 tablet (100 mg total) by mouth daily., Disp: 90 tablet, Rfl: 0   modafinil (PROVIGIL) 200 MG tablet, Take 1 tablet (200 mg total) by mouth  daily., Disp: 30 tablet, Rfl: 5   omeprazole (PRILOSEC) 20 MG capsule, Take 20 mg by mouth daily., Disp: , Rfl:    pantoprazole (PROTONIX) 40 MG tablet, Take 1 tablet (40 mg total) by mouth daily., Disp: 90 tablet, Rfl: 3   rosuvastatin (CRESTOR) 20 MG tablet, Take 1 tablet (20 mg total) by mouth daily., Disp: 90 tablet, Rfl: 0   Sod Fluoride-Potassium Nitrate (PREVIDENT 5000 SENSITIVE) 1.1-5 % GEL, Use to brush teeth once daily in the evening., Disp: 100 mL, Rfl: 5   spironolactone (ALDACTONE) 25 MG tablet, Take 1 tablet (25 mg total) by mouth daily., Disp: 90 tablet, Rfl: 1  Review of Systems:  Negative unless indicated in HPI.   Physical Exam: Vitals:   06/08/23 0801  BP: 120/88  Pulse: 73   Temp: 98.7 F (37.1 C)  TempSrc: Oral  SpO2: 100%  Weight: (!) 332 lb 4.8 oz (150.7 kg)  Height: 5' 5.5" (1.664 m)    Body mass index is 54.46 kg/m.   Physical Exam Vitals reviewed.  Constitutional:      General: She is not in acute distress.    Appearance: Normal appearance. She is not ill-appearing, toxic-appearing or diaphoretic.  HENT:     Head: Normocephalic.     Right Ear: Tympanic membrane, ear canal and external ear normal. There is no impacted cerumen.     Left Ear: Tympanic membrane, ear canal and external ear normal. There is no impacted cerumen.     Nose: Nose normal.     Mouth/Throat:     Mouth: Mucous membranes are moist.     Pharynx: Oropharynx is clear. No oropharyngeal exudate or posterior oropharyngeal erythema.  Eyes:     General: No scleral icterus.       Right eye: No discharge.        Left eye: No discharge.     Conjunctiva/sclera: Conjunctivae normal.     Pupils: Pupils are equal, round, and reactive to light.  Neck:     Vascular: No carotid bruit.  Cardiovascular:     Rate and Rhythm: Normal rate and regular rhythm.     Pulses: Normal pulses.     Heart sounds: Normal heart sounds.  Pulmonary:     Effort: Pulmonary effort is normal. No respiratory distress.     Breath sounds: Normal breath sounds.  Abdominal:     General: Abdomen is flat. Bowel sounds are normal.     Palpations: Abdomen is soft.  Musculoskeletal:        General: Normal range of motion.     Cervical back: Normal range of motion.  Skin:    General: Skin is warm and dry.  Neurological:     General: No focal deficit present.     Mental Status: She is alert and oriented to person, place, and time. Mental status is at baseline.  Psychiatric:        Mood and Affect: Mood normal.        Behavior: Behavior normal.        Thought Content: Thought content normal.        Judgment: Judgment normal.     Flowsheet Row Office Visit from 06/08/2023 in [facility]  [place]  PHQ-9 Total Score 4        Impression and Plan:  Vitamin D deficiency  Prediabetes -     Hemoglobin A1c; Future  Other hyperlipidemia -     Lipid panel; Future  Encounter for screening mammogram  for malignant neoplasm of breast -     Digital Screening Mammogram, Left and Right; Future  Encounter for preventive health examination  Encounter for hepatitis C screening test for low risk patient -     Hepatitis C antibody; Future  Immunization due  Obstructive sleep apnea hypopnea, severe  Morbid obesity (HCC) -     TSH; Future   -Recommend routine eye and dental care. -Healthy lifestyle discussed in detail. -Labs to be updated today. -Prostate cancer screening: N/A Health Maintenance  Topic Date Due   Hepatitis C Screening  Never done   Zoster (Shingles) Vaccine (1 of 2) Never done   Flu Shot  02/25/2023   COVID-19 Vaccine (3 - 2023-24 season) 03/28/2023   Mammogram  08/05/2023   Pap with HPV screening  12/25/2026   DTaP/Tdap/Td vaccine (3 - Td or Tdap) 01/06/2028   Colon Cancer Screening  07/11/2028   HIV Screening  Completed   HPV Vaccine  Aged Out     -Flu and first shingles vaccine in office today. -Will get COVID-vaccine at a later date at pharmacy. -Mammogram requested today.     Chaya Jan, MD [facility]

## 2023-06-08 NOTE — Addendum Note (Signed)
Addended by: Kern Reap B on: 06/08/2023 08:56 AM   Modules accepted: Orders

## 2023-06-09 LAB — HEPATITIS C ANTIBODY: Hepatitis C Ab: NONREACTIVE

## 2023-06-10 ENCOUNTER — Encounter: Payer: Self-pay | Admitting: Internal Medicine

## 2023-06-10 DIAGNOSIS — R7302 Impaired glucose tolerance (oral): Secondary | ICD-10-CM | POA: Insufficient documentation

## 2023-06-16 ENCOUNTER — Other Ambulatory Visit (HOSPITAL_BASED_OUTPATIENT_CLINIC_OR_DEPARTMENT_OTHER): Payer: Self-pay

## 2023-06-18 ENCOUNTER — Ambulatory Visit (INDEPENDENT_AMBULATORY_CARE_PROVIDER_SITE_OTHER): Payer: 59 | Admitting: Obstetrics and Gynecology

## 2023-06-18 ENCOUNTER — Encounter: Payer: Self-pay | Admitting: Obstetrics and Gynecology

## 2023-06-18 ENCOUNTER — Other Ambulatory Visit (HOSPITAL_COMMUNITY)
Admission: RE | Admit: 2023-06-18 | Discharge: 2023-06-18 | Disposition: A | Discharge status: Home / Self Care | Payer: 59 | Source: Ambulatory Visit | Attending: Obstetrics and Gynecology | Admitting: Obstetrics and Gynecology

## 2023-06-18 VITALS — BP 118/79 | HR 67 | Ht 65.0 in | Wt 331.0 lb

## 2023-06-18 DIAGNOSIS — Z01419 Encounter for gynecological examination (general) (routine) without abnormal findings: Secondary | ICD-10-CM | POA: Insufficient documentation

## 2023-06-18 DIAGNOSIS — Z1339 Encounter for screening examination for other mental health and behavioral disorders: Secondary | ICD-10-CM

## 2023-06-18 DIAGNOSIS — T8332XA Displacement of intrauterine contraceptive device, initial encounter: Secondary | ICD-10-CM | POA: Diagnosis not present

## 2023-06-18 DIAGNOSIS — Z124 Encounter for screening for malignant neoplasm of cervix: Secondary | ICD-10-CM | POA: Insufficient documentation

## 2023-06-18 NOTE — Progress Notes (Signed)
ANNUAL EXAM Patient name: Danielle Shaw MRN [id number]  Date of birth: November 15, 1970 Chief Complaint:   Gynecologic Exam  History of Present Illness:   Danielle Shaw is a 52 y.o. [id number] being seen today for a routine annual exam.   Discussed the use of AI scribe software for clinical note transcription with the patient, who gave verbal consent to proceed.  History of Present Illness   The patient presents for an annual check-up and to discuss the need for a Pap smear. She reports a history of an irregular Pap smear conducted by her primary care physician last year, which showed ASCUS but was negative for HPV. The patient has been using an IUD for contraception for the past 20 years, with the last change occurring five years ago. She reports no menstrual periods with the IUD in place. The patient is sexually active with a female partner and denies any breast or nipple changes. She has a history of breast reduction surgery 24 years ago.  The patient reports experiencing hot and cold flashes, which she manages by seeking cooler environments. She sleeps with a fan or air conditioner on and does not use heat at home. She also reports brief moments of intense heat during the day. The patient denies any pain during intercourse. She expresses nervousness about getting a Pap smear but agrees to proceed with the procedure. She also expresses concern about not being able to feel the strings of her IUD and requests an ultrasound to confirm its position.       No LMP recorded. (Menstrual status: IUD).   The pregnancy intention screening data noted above was reviewed. Potential methods of contraception were discussed. The patient elected to proceed with No data recorded.   Last pap     Component Value Date/Time   DIAGPAP (A) 12/24/2021 1039    - Atypical squamous cells of undetermined significance (ASC-US)   DIAGPAP  06/08/2018 0000    NEGATIVE FOR INTRAEPITHELIAL LESIONS OR MALIGNANCY.   HPVHIGH  Negative 12/24/2021 1039   ADEQPAP  12/24/2021 1039    Satisfactory for evaluation; transformation zone component ABSENT.   ADEQPAP  06/08/2018 0000    Satisfactory for evaluation  endocervical/transformation zone component PRESENT.     H/O abnormal pap: yes Last mammogram: 07/2021 BIRADS 1.  Last colonoscopy: 2019.      06/18/2023   10:53 AM 06/08/2023    8:01 AM 10/12/2022    2:32 PM 07/01/2022    3:19 PM 12/24/2021   10:09 AM  Depression screen PHQ 2/9  Decreased Interest 0 0 0  0  Down, Depressed, Hopeless 0 0 0 0 0  PHQ - 2 Score 0 0 0 0 0  Altered sleeping 0 1 0 2 1  Tired, decreased energy 0 3 0 0 1  Change in appetite 0 0 0 0 0  Feeling bad or failure about yourself  0 0 0 0 0  Trouble concentrating 0 0 0 0 0  Moving slowly or fidgety/restless 0 0 0 0 0  Suicidal thoughts 0 0 0 0 0  PHQ-9 Score 0 4 0 2 2  Difficult doing work/chores   Not difficult at all Not difficult at all Not difficult at all        06/18/2023   10:53 AM 10/12/2022    2:33 PM 07/07/2018    4:33 PM  GAD 7 : Generalized Anxiety Score  Nervous, Anxious, on Edge 0 0 0  Control/stop worrying 0 0 0  Worry too much - different things 0 0 0  Trouble relaxing 0 0 0  Restless 0 0 0  Easily annoyed or irritable 0 0 0  Afraid - awful might happen 0 0 0  Total GAD 7 Score 0 0 0  Anxiety Difficulty  Not difficult at all      Review of Systems:   Pertinent items are noted in HPI Denies any headaches, blurred vision, fatigue, shortness of breath, chest pain, abdominal pain, abnormal vaginal discharge/itching/odor/irritation, problems with periods, bowel movements, urination, or intercourse unless otherwise stated above. Pertinent History Reviewed:  Reviewed past medical,surgical, social and family history.  Reviewed problem list, medications and allergies. Physical Assessment:   Vitals:   06/18/23 1042  BP: 118/79  Pulse: 67  Weight: (!) 331 lb (150.1 kg)  Height: 5\' 5"  (1.651 m)  Body mass  index is 55.08 kg/m.        Physical Examination:   General appearance - well appearing, and in no distress  Mental status - alert, oriented to person, place, and time  Psych:  She has a normal mood and affect  Skin - warm and dry, normal color, no suspicious lesions noted  Chest - effort normal, all lung fields clear to auscultation bilaterally  Heart - normal rate and regular rhythm  Breasts - breasts appear normal, no suspicious masses, no skin or nipple changes or  axillary nodes, breast reduction scars noted  Abdomen - soft, nontender, nondistended, no masses or organomegaly  Pelvic -  VULVA: normal appearing vulva with no masses, tenderness or lesions   VAGINA: normal appearing vagina with normal color and discharge, no lesions   CERVIX: normal appearing cervix without discharge or lesions, no CMT  Thin prep pap is done with HR HPV cotesting  IUD strings not visualized  Extremities:  No swelling or varicosities noted  Chaperone present for exam  No results found for this or any previous visit (from the past 24 hour(s)).    Assessment & Plan:  1. Well woman exam with routine gynecological exam 2. Screening for cervical cancer  Assessment and Plan    Cervical Cancer Screening Last Pap smear one year ago showed ASCUS with negative HPV. Discussed the recommendation for repeat Pap smear in three years, but patient requested to have it done today. -Pap smear completed today.  Menopausal Symptoms Reports hot flashes and cold flashes. No significant distress. -Continue monitoring symptoms.  Contraception Mirena IUD in place for five years, good for up to eight years. -Continue with current Mirena IUD for another three years. -Strings not visualized on exam, pelvic US ordered to assess still in placed  Breast Health No changes noted, history of breast reduction 24 years ago. -Schedule mammogram.       No orders of the defined types were placed in this  encounter.   Meds: No orders of the defined types were placed in this encounter.   Follow-up: No follow-ups on file.  Lorriane Shire, MD 06/18/2023 11:28 AM

## 2023-06-25 LAB — CYTOLOGY - PAP
Adequacy: ABSENT
Comment: NEGATIVE
Diagnosis: NEGATIVE
High risk HPV: NEGATIVE

## 2023-07-01 ENCOUNTER — Ambulatory Visit (HOSPITAL_BASED_OUTPATIENT_CLINIC_OR_DEPARTMENT_OTHER)
Admission: RE | Admit: 2023-07-01 | Discharge: 2023-07-01 | Disposition: A | Discharge status: Home / Self Care | Payer: 59 | Source: Ambulatory Visit | Attending: Obstetrics and Gynecology | Admitting: Obstetrics and Gynecology

## 2023-07-01 ENCOUNTER — Encounter (HOSPITAL_BASED_OUTPATIENT_CLINIC_OR_DEPARTMENT_OTHER): Payer: Self-pay

## 2023-07-01 DIAGNOSIS — T8332XA Displacement of intrauterine contraceptive device, initial encounter: Secondary | ICD-10-CM | POA: Insufficient documentation

## 2023-07-01 DIAGNOSIS — Z01419 Encounter for gynecological examination (general) (routine) without abnormal findings: Secondary | ICD-10-CM

## 2023-07-06 NOTE — Telephone Encounter (Signed)
Disregard

## 2023-08-18 ENCOUNTER — Other Ambulatory Visit: Payer: Self-pay

## 2023-08-18 ENCOUNTER — Other Ambulatory Visit (INDEPENDENT_AMBULATORY_CARE_PROVIDER_SITE_OTHER): Payer: Self-pay | Admitting: Internal Medicine

## 2023-08-18 DIAGNOSIS — I10 Essential (primary) hypertension: Secondary | ICD-10-CM

## 2023-08-19 ENCOUNTER — Other Ambulatory Visit (HOSPITAL_BASED_OUTPATIENT_CLINIC_OR_DEPARTMENT_OTHER): Payer: Self-pay

## 2023-08-19 MED ORDER — LOSARTAN POTASSIUM 100 MG PO TABS
100.0000 mg | ORAL_TABLET | Freq: Every day | ORAL | 1 refills | Status: DC
  Filled 2023-08-19: qty 30, 30d supply, fill #0
  Filled 2023-09-13: qty 30, 30d supply, fill #1
  Filled 2023-10-13: qty 30, 30d supply, fill #2
  Filled 2023-11-10: qty 30, 30d supply, fill #3
  Filled 2023-12-12: qty 30, 30d supply, fill #4
  Filled 2024-01-12: qty 30, 30d supply, fill #5

## 2023-08-19 MED ORDER — ROSUVASTATIN CALCIUM 20 MG PO TABS
20.0000 mg | ORAL_TABLET | Freq: Every day | ORAL | 1 refills | Status: DC
Start: 2023-08-19 — End: 2024-03-15
  Filled 2023-08-19: qty 30, 30d supply, fill #0
  Filled 2023-09-13: qty 30, 30d supply, fill #1
  Filled 2023-10-13: qty 30, 30d supply, fill #2
  Filled 2023-11-10: qty 30, 30d supply, fill #3
  Filled 2023-12-12: qty 30, 30d supply, fill #4
  Filled 2024-01-12: qty 30, 30d supply, fill #5

## 2023-10-13 ENCOUNTER — Other Ambulatory Visit: Payer: Self-pay

## 2023-11-10 ENCOUNTER — Other Ambulatory Visit (HOSPITAL_BASED_OUTPATIENT_CLINIC_OR_DEPARTMENT_OTHER): Payer: Self-pay

## 2023-11-10 ENCOUNTER — Other Ambulatory Visit: Payer: Self-pay | Admitting: Internal Medicine

## 2023-11-10 ENCOUNTER — Other Ambulatory Visit: Payer: Self-pay

## 2023-11-10 DIAGNOSIS — I10 Essential (primary) hypertension: Secondary | ICD-10-CM

## 2023-11-10 MED ORDER — SPIRONOLACTONE 25 MG PO TABS
25.0000 mg | ORAL_TABLET | Freq: Every day | ORAL | 1 refills | Status: AC
  Filled 2023-11-10: qty 90, 90d supply, fill #0
  Filled 2024-03-15 – 2024-04-27 (×3): qty 90, 90d supply, fill #1

## 2023-11-16 ENCOUNTER — Other Ambulatory Visit (HOSPITAL_BASED_OUTPATIENT_CLINIC_OR_DEPARTMENT_OTHER): Payer: Self-pay

## 2023-12-06 ENCOUNTER — Encounter (HOSPITAL_COMMUNITY): Payer: Self-pay

## 2023-12-13 ENCOUNTER — Other Ambulatory Visit (HOSPITAL_BASED_OUTPATIENT_CLINIC_OR_DEPARTMENT_OTHER): Payer: Self-pay

## 2023-12-13 ENCOUNTER — Ambulatory Visit: Admitting: Internal Medicine

## 2023-12-18 ENCOUNTER — Other Ambulatory Visit (HOSPITAL_BASED_OUTPATIENT_CLINIC_OR_DEPARTMENT_OTHER): Payer: Self-pay

## 2024-01-12 ENCOUNTER — Other Ambulatory Visit: Payer: Self-pay | Admitting: Internal Medicine

## 2024-01-12 ENCOUNTER — Other Ambulatory Visit (HOSPITAL_BASED_OUTPATIENT_CLINIC_OR_DEPARTMENT_OTHER): Payer: Self-pay

## 2024-01-12 ENCOUNTER — Other Ambulatory Visit: Payer: Self-pay

## 2024-01-12 ENCOUNTER — Encounter (HOSPITAL_BASED_OUTPATIENT_CLINIC_OR_DEPARTMENT_OTHER): Payer: Self-pay

## 2024-01-12 DIAGNOSIS — G4731 Primary central sleep apnea: Secondary | ICD-10-CM

## 2024-01-12 MED ORDER — ALBUTEROL SULFATE HFA 108 (90 BASE) MCG/ACT IN AERS
2.0000 | INHALATION_SPRAY | Freq: Four times a day (QID) | RESPIRATORY_TRACT | 1 refills | Status: AC | PRN
  Filled 2024-01-12: qty 6.7, 25d supply, fill #0

## 2024-03-15 ENCOUNTER — Other Ambulatory Visit (HOSPITAL_BASED_OUTPATIENT_CLINIC_OR_DEPARTMENT_OTHER): Payer: Self-pay

## 2024-03-15 ENCOUNTER — Other Ambulatory Visit (INDEPENDENT_AMBULATORY_CARE_PROVIDER_SITE_OTHER): Payer: Self-pay | Admitting: Internal Medicine

## 2024-03-15 ENCOUNTER — Other Ambulatory Visit: Payer: Self-pay

## 2024-03-15 DIAGNOSIS — I10 Essential (primary) hypertension: Secondary | ICD-10-CM

## 2024-03-15 MED ORDER — LOSARTAN POTASSIUM 100 MG PO TABS
100.0000 mg | ORAL_TABLET | Freq: Every day | ORAL | 0 refills | Status: AC
  Filled 2024-03-15 – 2024-03-28 (×2): qty 90, 90d supply, fill #0
  Filled 2024-04-15: qty 30, 30d supply, fill #0
  Filled 2024-04-27: qty 90, 90d supply, fill #0

## 2024-03-15 MED ORDER — ROSUVASTATIN CALCIUM 20 MG PO TABS
20.0000 mg | ORAL_TABLET | Freq: Every day | ORAL | 0 refills | Status: AC
  Filled 2024-03-15 – 2024-03-28 (×2): qty 90, 90d supply, fill #0
  Filled 2024-04-15: qty 30, 30d supply, fill #0
  Filled 2024-04-27: qty 90, 90d supply, fill #0

## 2024-03-17 ENCOUNTER — Telehealth: Payer: Self-pay

## 2024-03-17 NOTE — Transitions of Care (Post Inpatient/ED Visit) (Signed)
 03/17/2024  Name: Danielle Shaw MRN: [id number] DOB: [birth year]  Today's TOC FU Call Status: Today's TOC FU Call Status:: Successful TOC FU Call Completed TOC FU Call Complete Date: 03/17/24 Patient's Name and Date of Birth confirmed.  Transition Care Management Follow-up Telephone Call Date of Discharge: 03/15/24 Discharge Facility: Other ([facility]) Name of Other ([facility]) Discharge Facility: [facility] Type of Discharge: Emergency Department Reason for ED Visit: Other: (Fall) How have you been since you were released from the hospital?: Better ( alittle bit better) Any questions or concerns?: Yes Patient Questions/Concerns:: Medication refill.  Items Reviewed: Did you receive and understand the discharge instructions provided?: Yes Medications obtained,verified, and reconciled?: Yes (Medications Reviewed) Any new allergies since your discharge?: No Dietary orders reviewed?: No Do you have support at home?: Yes People in Home [RPT]: child(ren), adult  Medications Reviewed Today: Medications Reviewed Today     Reviewed by Ege Muckey, CMA (Certified Medical Assistant) on 03/17/24 at 1453  Med List Status: <None>   Medication Order Taking? Sig Documenting Provider Last Dose Status Informant  albuterol  (VENTOLIN  HFA) 108 (90 Base) MCG/ACT inhaler [id number] Yes Inhale 2 puffs into the lungs every 6 (six) hours as needed for wheezing or shortness of breath. Theophilus Andrews, Tully GRADE, MD  Active   amoxicillin  (AMOXIL ) 500 MG capsule [id number]  Take 1 capsule (500 mg total) by mouth 3 (three) times daily until gone.  Patient not taking: Reported on 03/17/2024     Active   Cholecalciferol  1.25 MG (50000 UT) capsule [id number] Yes Take 1 capsule (50,000 Units total) by mouth once a week. [place] Adelita PENNER, MD  Active   clindamycin  (CLEOCIN ) 150 MG capsule [id number]  Take 1 capsule (150 mg total) by mouth 3 (three) times daily until gone.  Patient not taking:  Reported on 03/17/2024     Active   cyanocobalamin  (,VITAMIN B-12,) 1000 MCG/ML injection [id number]  Inject 1,000 mcg into the muscle once.  Patient not taking: Reported on 03/17/2024   [provider]  Active   cyclobenzaprine  (FLEXERIL ) 5 MG tablet [id number] Yes Take 1 tablet (5 mg total) by mouth 3 (three) times daily as needed for muscle spasms. Rollene Almarie LABOR, MD  Active Self  gabapentin (NEURONTIN) 100 MG capsule [id number] Yes Take 100 mg by mouth 3 (three) times daily. [provider]  Active   levocetirizine (XYZAL ) 5 MG tablet [id number] Yes Take 1 tablet (5 mg total) by mouth every evening. Theophilus Andrews, Tully GRADE, MD  Active   losartan  (COZAAR ) 100 MG tablet [id number] Yes Take 1 tablet (100 mg total) by mouth daily. Theophilus Andrews, Tully GRADE, MD  Active   modafinil  (PROVIGIL ) 200 MG tablet [id number] Yes Take 1 tablet (200 mg total) by mouth daily. Dohmeier, Dedra, MD  Active   omeprazole  (PRILOSEC) 20 MG capsule [id number] Yes Take 20 mg by mouth daily. [provider]  Active   pantoprazole  (PROTONIX ) 40 MG tablet [id number]  Take 1 tablet (40 mg total) by mouth daily.  Patient not taking: Reported on 03/17/2024   Rollene Almarie LABOR, MD  Active   rosuvastatin  (CRESTOR ) 20 MG tablet [id number] Yes Take 1 tablet (20 mg total) by mouth daily. Theophilus Andrews, Tully GRADE, MD  Active   Sod Fluoride -Potassium Nitrate  (PREVIDENT  5000 SENSITIVE) 1.1-5 % GEL [id number] Yes Use to brush teeth once daily in the evening.   Active   spironolactone  (ALDACTONE ) 25 MG tablet [id number] Yes Take 1 tablet (25 mg total) by mouth daily. Theophilus  Delma Tully GRADE, MD  Active             Home Care and Equipment/Supplies: Were Home Health Services Ordered?: No Any new equipment or medical supplies ordered?: No  Functional Questionnaire:    Follow up appointments reviewed: PCP Follow-up appointment confirmed?: NA Specialist Hospital Follow-up appointment  confirmed?: NA (encourage pt to follow up with ortho as per ER provider's instruction.) Do you understand care options if your condition(s) worsen?: Yes-patient verbalized understanding    SIGNATURE: Willeen Craver, CMA

## 2024-03-28 ENCOUNTER — Other Ambulatory Visit (HOSPITAL_BASED_OUTPATIENT_CLINIC_OR_DEPARTMENT_OTHER): Payer: Self-pay

## 2024-04-07 ENCOUNTER — Other Ambulatory Visit (HOSPITAL_BASED_OUTPATIENT_CLINIC_OR_DEPARTMENT_OTHER): Payer: Self-pay

## 2024-04-15 ENCOUNTER — Other Ambulatory Visit (HOSPITAL_BASED_OUTPATIENT_CLINIC_OR_DEPARTMENT_OTHER): Payer: Self-pay

## 2024-04-15 ENCOUNTER — Other Ambulatory Visit: Payer: Self-pay | Admitting: Internal Medicine

## 2024-04-15 DIAGNOSIS — Z79891 Long term (current) use of opiate analgesic: Secondary | ICD-10-CM

## 2024-04-17 ENCOUNTER — Other Ambulatory Visit: Payer: Self-pay

## 2024-04-17 ENCOUNTER — Other Ambulatory Visit (HOSPITAL_BASED_OUTPATIENT_CLINIC_OR_DEPARTMENT_OTHER): Payer: Self-pay

## 2024-04-17 MED ORDER — LEVOCETIRIZINE DIHYDROCHLORIDE 5 MG PO TABS
5.0000 mg | ORAL_TABLET | Freq: Every evening | ORAL | 0 refills | Status: AC
Start: 2024-04-17 — End: ?
  Filled 2024-04-17: qty 30, 30d supply, fill #0

## 2024-04-21 ENCOUNTER — Other Ambulatory Visit (HOSPITAL_BASED_OUTPATIENT_CLINIC_OR_DEPARTMENT_OTHER): Payer: Self-pay

## 2024-04-26 ENCOUNTER — Other Ambulatory Visit (HOSPITAL_BASED_OUTPATIENT_CLINIC_OR_DEPARTMENT_OTHER): Payer: Self-pay

## 2024-04-27 ENCOUNTER — Other Ambulatory Visit (HOSPITAL_BASED_OUTPATIENT_CLINIC_OR_DEPARTMENT_OTHER): Payer: Self-pay

## 2024-08-12 ENCOUNTER — Other Ambulatory Visit (HOSPITAL_BASED_OUTPATIENT_CLINIC_OR_DEPARTMENT_OTHER): Payer: Self-pay
